# Patient Record
Sex: Male | Born: 1970 | Race: White | Hispanic: No | Smoking: Never smoker
Health system: Southern US, Community
[De-identification: ages and names within clinical notes are randomized; demographics above are authoritative.]

## PROBLEM LIST (undated history)

## (undated) DIAGNOSIS — D72828 Other elevated white blood cell count: Secondary | ICD-10-CM

## (undated) DIAGNOSIS — R569 Unspecified convulsions: Secondary | ICD-10-CM

## (undated) DIAGNOSIS — E7800 Pure hypercholesterolemia, unspecified: Secondary | ICD-10-CM

## (undated) DIAGNOSIS — G0481 Other encephalitis and encephalomyelitis: Secondary | ICD-10-CM

## (undated) DIAGNOSIS — E291 Testicular hypofunction: Secondary | ICD-10-CM

## (undated) DIAGNOSIS — I6783 Posterior reversible encephalopathy syndrome: Secondary | ICD-10-CM

## (undated) DIAGNOSIS — E079 Disorder of thyroid, unspecified: Secondary | ICD-10-CM

## (undated) DIAGNOSIS — I639 Cerebral infarction, unspecified: Secondary | ICD-10-CM

## (undated) DIAGNOSIS — R066 Hiccough: Secondary | ICD-10-CM

## (undated) DIAGNOSIS — C719 Malignant neoplasm of brain, unspecified: Secondary | ICD-10-CM

## (undated) HISTORY — DX: Other elevated white blood cell count: D72.828

## (undated) HISTORY — DX: Unspecified convulsions: R56.9

## (undated) HISTORY — DX: Hiccough: R06.6

## (undated) HISTORY — PX: BRAIN SURGERY: SHX531

## (undated) HISTORY — DX: Pure hypercholesterolemia, unspecified: E78.00

## (undated) HISTORY — DX: Cerebral infarction, unspecified: I63.9

## (undated) HISTORY — DX: Other encephalitis and encephalomyelitis: G04.81

## (undated) HISTORY — DX: Disorder of thyroid, unspecified: E07.9

## (undated) HISTORY — DX: Posterior reversible encephalopathy syndrome: I67.83

## (undated) HISTORY — PX: EYE SURGERY: SHX253

## (undated) HISTORY — PX: APPENDECTOMY: SHX54

## (undated) HISTORY — DX: Testicular hypofunction: E29.1

## (undated) HISTORY — DX: Malignant neoplasm of brain, unspecified: C71.9

---

## 1991-03-18 DIAGNOSIS — G40909 Epilepsy, unspecified, not intractable, without status epilepticus: Secondary | ICD-10-CM | POA: Insufficient documentation

## 2002-10-04 ENCOUNTER — Inpatient Hospital Stay (HOSPITAL_COMMUNITY)
Admission: RE | Admit: 2002-10-04 | Discharge: 2002-10-25 | Payer: Self-pay | Admitting: Physical Medicine & Rehabilitation

## 2002-10-05 ENCOUNTER — Encounter: Payer: Self-pay | Admitting: Physical Medicine & Rehabilitation

## 2002-10-06 ENCOUNTER — Encounter: Payer: Self-pay | Admitting: Physical Medicine & Rehabilitation

## 2002-11-26 ENCOUNTER — Encounter
Admission: RE | Admit: 2002-11-26 | Discharge: 2003-02-24 | Payer: Self-pay | Admitting: Physical Medicine & Rehabilitation

## 2003-05-07 ENCOUNTER — Encounter
Admission: RE | Admit: 2003-05-07 | Discharge: 2003-08-05 | Payer: Self-pay | Admitting: Physical Medicine & Rehabilitation

## 2003-08-06 ENCOUNTER — Encounter
Admission: RE | Admit: 2003-08-06 | Discharge: 2003-11-04 | Payer: Self-pay | Admitting: Physical Medicine & Rehabilitation

## 2004-02-02 ENCOUNTER — Encounter
Admission: RE | Admit: 2004-02-02 | Discharge: 2004-05-02 | Payer: Self-pay | Admitting: Physical Medicine & Rehabilitation

## 2004-02-04 ENCOUNTER — Ambulatory Visit: Payer: Self-pay | Admitting: Physical Medicine & Rehabilitation

## 2011-01-31 DIAGNOSIS — E039 Hypothyroidism, unspecified: Secondary | ICD-10-CM | POA: Diagnosis not present

## 2011-01-31 DIAGNOSIS — L509 Urticaria, unspecified: Secondary | ICD-10-CM | POA: Diagnosis not present

## 2011-04-25 DIAGNOSIS — J309 Allergic rhinitis, unspecified: Secondary | ICD-10-CM | POA: Diagnosis not present

## 2011-04-25 DIAGNOSIS — L500 Allergic urticaria: Secondary | ICD-10-CM | POA: Diagnosis not present

## 2011-05-10 DIAGNOSIS — D235 Other benign neoplasm of skin of trunk: Secondary | ICD-10-CM | POA: Diagnosis not present

## 2011-05-10 DIAGNOSIS — L570 Actinic keratosis: Secondary | ICD-10-CM | POA: Diagnosis not present

## 2011-05-23 DIAGNOSIS — J309 Allergic rhinitis, unspecified: Secondary | ICD-10-CM | POA: Diagnosis not present

## 2011-06-09 DIAGNOSIS — T887XXA Unspecified adverse effect of drug or medicament, initial encounter: Secondary | ICD-10-CM | POA: Diagnosis not present

## 2011-06-09 DIAGNOSIS — T7840XA Allergy, unspecified, initial encounter: Secondary | ICD-10-CM | POA: Diagnosis not present

## 2011-07-07 DIAGNOSIS — IMO0002 Reserved for concepts with insufficient information to code with codable children: Secondary | ICD-10-CM | POA: Diagnosis not present

## 2011-10-18 DIAGNOSIS — C719 Malignant neoplasm of brain, unspecified: Secondary | ICD-10-CM | POA: Diagnosis not present

## 2011-10-18 DIAGNOSIS — C716 Malignant neoplasm of cerebellum: Secondary | ICD-10-CM | POA: Diagnosis not present

## 2011-10-20 DIAGNOSIS — Z23 Encounter for immunization: Secondary | ICD-10-CM | POA: Diagnosis not present

## 2012-02-01 DIAGNOSIS — J209 Acute bronchitis, unspecified: Secondary | ICD-10-CM | POA: Diagnosis not present

## 2012-07-10 DIAGNOSIS — E291 Testicular hypofunction: Secondary | ICD-10-CM | POA: Diagnosis not present

## 2012-09-07 DIAGNOSIS — R21 Rash and other nonspecific skin eruption: Secondary | ICD-10-CM | POA: Diagnosis not present

## 2012-10-17 DIAGNOSIS — Z23 Encounter for immunization: Secondary | ICD-10-CM | POA: Diagnosis not present

## 2012-10-23 DIAGNOSIS — C716 Malignant neoplasm of cerebellum: Secondary | ICD-10-CM | POA: Diagnosis not present

## 2012-10-23 DIAGNOSIS — D320 Benign neoplasm of cerebral meninges: Secondary | ICD-10-CM | POA: Diagnosis not present

## 2012-11-19 DIAGNOSIS — C716 Malignant neoplasm of cerebellum: Secondary | ICD-10-CM | POA: Diagnosis not present

## 2012-11-19 DIAGNOSIS — IMO0001 Reserved for inherently not codable concepts without codable children: Secondary | ICD-10-CM | POA: Diagnosis not present

## 2012-11-19 DIAGNOSIS — R269 Unspecified abnormalities of gait and mobility: Secondary | ICD-10-CM | POA: Diagnosis not present

## 2012-11-23 DIAGNOSIS — R269 Unspecified abnormalities of gait and mobility: Secondary | ICD-10-CM | POA: Diagnosis not present

## 2012-11-23 DIAGNOSIS — C716 Malignant neoplasm of cerebellum: Secondary | ICD-10-CM | POA: Diagnosis not present

## 2012-11-23 DIAGNOSIS — IMO0001 Reserved for inherently not codable concepts without codable children: Secondary | ICD-10-CM | POA: Diagnosis not present

## 2012-11-28 DIAGNOSIS — R269 Unspecified abnormalities of gait and mobility: Secondary | ICD-10-CM | POA: Diagnosis not present

## 2012-11-28 DIAGNOSIS — IMO0001 Reserved for inherently not codable concepts without codable children: Secondary | ICD-10-CM | POA: Diagnosis not present

## 2012-11-28 DIAGNOSIS — C716 Malignant neoplasm of cerebellum: Secondary | ICD-10-CM | POA: Diagnosis not present

## 2012-11-30 DIAGNOSIS — IMO0001 Reserved for inherently not codable concepts without codable children: Secondary | ICD-10-CM | POA: Diagnosis not present

## 2012-11-30 DIAGNOSIS — R269 Unspecified abnormalities of gait and mobility: Secondary | ICD-10-CM | POA: Diagnosis not present

## 2012-11-30 DIAGNOSIS — C716 Malignant neoplasm of cerebellum: Secondary | ICD-10-CM | POA: Diagnosis not present

## 2012-12-03 DIAGNOSIS — C716 Malignant neoplasm of cerebellum: Secondary | ICD-10-CM | POA: Diagnosis not present

## 2012-12-03 DIAGNOSIS — R269 Unspecified abnormalities of gait and mobility: Secondary | ICD-10-CM | POA: Diagnosis not present

## 2012-12-03 DIAGNOSIS — IMO0001 Reserved for inherently not codable concepts without codable children: Secondary | ICD-10-CM | POA: Diagnosis not present

## 2012-12-07 DIAGNOSIS — IMO0001 Reserved for inherently not codable concepts without codable children: Secondary | ICD-10-CM | POA: Diagnosis not present

## 2012-12-07 DIAGNOSIS — C716 Malignant neoplasm of cerebellum: Secondary | ICD-10-CM | POA: Diagnosis not present

## 2012-12-07 DIAGNOSIS — R269 Unspecified abnormalities of gait and mobility: Secondary | ICD-10-CM | POA: Diagnosis not present

## 2012-12-11 DIAGNOSIS — R269 Unspecified abnormalities of gait and mobility: Secondary | ICD-10-CM | POA: Diagnosis not present

## 2012-12-11 DIAGNOSIS — C716 Malignant neoplasm of cerebellum: Secondary | ICD-10-CM | POA: Diagnosis not present

## 2012-12-11 DIAGNOSIS — IMO0001 Reserved for inherently not codable concepts without codable children: Secondary | ICD-10-CM | POA: Diagnosis not present

## 2012-12-18 DIAGNOSIS — R29898 Other symptoms and signs involving the musculoskeletal system: Secondary | ICD-10-CM | POA: Diagnosis not present

## 2012-12-18 DIAGNOSIS — IMO0001 Reserved for inherently not codable concepts without codable children: Secondary | ICD-10-CM | POA: Diagnosis not present

## 2012-12-18 DIAGNOSIS — C716 Malignant neoplasm of cerebellum: Secondary | ICD-10-CM | POA: Diagnosis not present

## 2012-12-18 DIAGNOSIS — R269 Unspecified abnormalities of gait and mobility: Secondary | ICD-10-CM | POA: Diagnosis not present

## 2012-12-20 DIAGNOSIS — C716 Malignant neoplasm of cerebellum: Secondary | ICD-10-CM | POA: Diagnosis not present

## 2012-12-20 DIAGNOSIS — IMO0001 Reserved for inherently not codable concepts without codable children: Secondary | ICD-10-CM | POA: Diagnosis not present

## 2012-12-20 DIAGNOSIS — R29898 Other symptoms and signs involving the musculoskeletal system: Secondary | ICD-10-CM | POA: Diagnosis not present

## 2012-12-20 DIAGNOSIS — R269 Unspecified abnormalities of gait and mobility: Secondary | ICD-10-CM | POA: Diagnosis not present

## 2012-12-26 DIAGNOSIS — IMO0001 Reserved for inherently not codable concepts without codable children: Secondary | ICD-10-CM | POA: Diagnosis not present

## 2012-12-26 DIAGNOSIS — R269 Unspecified abnormalities of gait and mobility: Secondary | ICD-10-CM | POA: Diagnosis not present

## 2012-12-26 DIAGNOSIS — R29898 Other symptoms and signs involving the musculoskeletal system: Secondary | ICD-10-CM | POA: Diagnosis not present

## 2012-12-26 DIAGNOSIS — C716 Malignant neoplasm of cerebellum: Secondary | ICD-10-CM | POA: Diagnosis not present

## 2012-12-28 DIAGNOSIS — R269 Unspecified abnormalities of gait and mobility: Secondary | ICD-10-CM | POA: Diagnosis not present

## 2012-12-28 DIAGNOSIS — C716 Malignant neoplasm of cerebellum: Secondary | ICD-10-CM | POA: Diagnosis not present

## 2012-12-28 DIAGNOSIS — IMO0001 Reserved for inherently not codable concepts without codable children: Secondary | ICD-10-CM | POA: Diagnosis not present

## 2012-12-28 DIAGNOSIS — R29898 Other symptoms and signs involving the musculoskeletal system: Secondary | ICD-10-CM | POA: Diagnosis not present

## 2012-12-31 DIAGNOSIS — C716 Malignant neoplasm of cerebellum: Secondary | ICD-10-CM | POA: Diagnosis not present

## 2012-12-31 DIAGNOSIS — IMO0001 Reserved for inherently not codable concepts without codable children: Secondary | ICD-10-CM | POA: Diagnosis not present

## 2012-12-31 DIAGNOSIS — R29898 Other symptoms and signs involving the musculoskeletal system: Secondary | ICD-10-CM | POA: Diagnosis not present

## 2012-12-31 DIAGNOSIS — R269 Unspecified abnormalities of gait and mobility: Secondary | ICD-10-CM | POA: Diagnosis not present

## 2013-01-02 DIAGNOSIS — R269 Unspecified abnormalities of gait and mobility: Secondary | ICD-10-CM | POA: Diagnosis not present

## 2013-01-02 DIAGNOSIS — IMO0001 Reserved for inherently not codable concepts without codable children: Secondary | ICD-10-CM | POA: Diagnosis not present

## 2013-01-02 DIAGNOSIS — R29898 Other symptoms and signs involving the musculoskeletal system: Secondary | ICD-10-CM | POA: Diagnosis not present

## 2013-01-02 DIAGNOSIS — C716 Malignant neoplasm of cerebellum: Secondary | ICD-10-CM | POA: Diagnosis not present

## 2013-01-08 DIAGNOSIS — R29898 Other symptoms and signs involving the musculoskeletal system: Secondary | ICD-10-CM | POA: Diagnosis not present

## 2013-01-08 DIAGNOSIS — C716 Malignant neoplasm of cerebellum: Secondary | ICD-10-CM | POA: Diagnosis not present

## 2013-01-08 DIAGNOSIS — R269 Unspecified abnormalities of gait and mobility: Secondary | ICD-10-CM | POA: Diagnosis not present

## 2013-01-08 DIAGNOSIS — IMO0001 Reserved for inherently not codable concepts without codable children: Secondary | ICD-10-CM | POA: Diagnosis not present

## 2013-01-15 DIAGNOSIS — IMO0001 Reserved for inherently not codable concepts without codable children: Secondary | ICD-10-CM | POA: Diagnosis not present

## 2013-01-15 DIAGNOSIS — R269 Unspecified abnormalities of gait and mobility: Secondary | ICD-10-CM | POA: Diagnosis not present

## 2013-01-15 DIAGNOSIS — R29898 Other symptoms and signs involving the musculoskeletal system: Secondary | ICD-10-CM | POA: Diagnosis not present

## 2013-01-15 DIAGNOSIS — C716 Malignant neoplasm of cerebellum: Secondary | ICD-10-CM | POA: Diagnosis not present

## 2013-02-04 DIAGNOSIS — E291 Testicular hypofunction: Secondary | ICD-10-CM | POA: Diagnosis not present

## 2013-02-04 DIAGNOSIS — Z79899 Other long term (current) drug therapy: Secondary | ICD-10-CM | POA: Diagnosis not present

## 2013-02-04 DIAGNOSIS — E038 Other specified hypothyroidism: Secondary | ICD-10-CM | POA: Diagnosis not present

## 2013-02-04 DIAGNOSIS — G40909 Epilepsy, unspecified, not intractable, without status epilepticus: Secondary | ICD-10-CM | POA: Diagnosis not present

## 2013-03-06 DIAGNOSIS — IMO0001 Reserved for inherently not codable concepts without codable children: Secondary | ICD-10-CM | POA: Diagnosis not present

## 2013-03-06 DIAGNOSIS — R471 Dysarthria and anarthria: Secondary | ICD-10-CM | POA: Diagnosis not present

## 2013-03-06 DIAGNOSIS — R498 Other voice and resonance disorders: Secondary | ICD-10-CM | POA: Diagnosis not present

## 2013-03-06 DIAGNOSIS — Z9889 Other specified postprocedural states: Secondary | ICD-10-CM | POA: Diagnosis not present

## 2013-03-06 DIAGNOSIS — R4789 Other speech disturbances: Secondary | ICD-10-CM | POA: Diagnosis not present

## 2013-03-11 DIAGNOSIS — R4789 Other speech disturbances: Secondary | ICD-10-CM | POA: Diagnosis not present

## 2013-03-11 DIAGNOSIS — Z9889 Other specified postprocedural states: Secondary | ICD-10-CM | POA: Diagnosis not present

## 2013-03-11 DIAGNOSIS — R498 Other voice and resonance disorders: Secondary | ICD-10-CM | POA: Diagnosis not present

## 2013-03-11 DIAGNOSIS — R471 Dysarthria and anarthria: Secondary | ICD-10-CM | POA: Diagnosis not present

## 2013-03-11 DIAGNOSIS — IMO0001 Reserved for inherently not codable concepts without codable children: Secondary | ICD-10-CM | POA: Diagnosis not present

## 2013-03-13 DIAGNOSIS — R4789 Other speech disturbances: Secondary | ICD-10-CM | POA: Diagnosis not present

## 2013-03-13 DIAGNOSIS — R471 Dysarthria and anarthria: Secondary | ICD-10-CM | POA: Diagnosis not present

## 2013-03-13 DIAGNOSIS — Z9889 Other specified postprocedural states: Secondary | ICD-10-CM | POA: Diagnosis not present

## 2013-03-13 DIAGNOSIS — IMO0001 Reserved for inherently not codable concepts without codable children: Secondary | ICD-10-CM | POA: Diagnosis not present

## 2013-03-13 DIAGNOSIS — R498 Other voice and resonance disorders: Secondary | ICD-10-CM | POA: Diagnosis not present

## 2013-03-18 DIAGNOSIS — R4789 Other speech disturbances: Secondary | ICD-10-CM | POA: Diagnosis not present

## 2013-03-18 DIAGNOSIS — IMO0001 Reserved for inherently not codable concepts without codable children: Secondary | ICD-10-CM | POA: Diagnosis not present

## 2013-03-18 DIAGNOSIS — Z9889 Other specified postprocedural states: Secondary | ICD-10-CM | POA: Diagnosis not present

## 2013-03-18 DIAGNOSIS — R498 Other voice and resonance disorders: Secondary | ICD-10-CM | POA: Diagnosis not present

## 2013-03-18 DIAGNOSIS — R471 Dysarthria and anarthria: Secondary | ICD-10-CM | POA: Diagnosis not present

## 2013-04-03 DIAGNOSIS — Z86010 Personal history of colonic polyps: Secondary | ICD-10-CM | POA: Diagnosis not present

## 2013-04-03 DIAGNOSIS — K648 Other hemorrhoids: Secondary | ICD-10-CM | POA: Diagnosis not present

## 2013-04-03 DIAGNOSIS — D126 Benign neoplasm of colon, unspecified: Secondary | ICD-10-CM | POA: Diagnosis not present

## 2013-04-03 DIAGNOSIS — K921 Melena: Secondary | ICD-10-CM | POA: Diagnosis not present

## 2013-04-17 DIAGNOSIS — R4789 Other speech disturbances: Secondary | ICD-10-CM | POA: Diagnosis not present

## 2013-04-17 DIAGNOSIS — Z9889 Other specified postprocedural states: Secondary | ICD-10-CM | POA: Diagnosis not present

## 2013-04-17 DIAGNOSIS — IMO0001 Reserved for inherently not codable concepts without codable children: Secondary | ICD-10-CM | POA: Diagnosis not present

## 2013-04-17 DIAGNOSIS — R471 Dysarthria and anarthria: Secondary | ICD-10-CM | POA: Diagnosis not present

## 2013-04-17 DIAGNOSIS — R498 Other voice and resonance disorders: Secondary | ICD-10-CM | POA: Diagnosis not present

## 2013-04-18 DIAGNOSIS — D126 Benign neoplasm of colon, unspecified: Secondary | ICD-10-CM | POA: Diagnosis not present

## 2013-04-18 DIAGNOSIS — Z86010 Personal history of colonic polyps: Secondary | ICD-10-CM | POA: Diagnosis not present

## 2013-04-18 DIAGNOSIS — K6289 Other specified diseases of anus and rectum: Secondary | ICD-10-CM | POA: Diagnosis not present

## 2013-04-18 DIAGNOSIS — Z800 Family history of malignant neoplasm of digestive organs: Secondary | ICD-10-CM | POA: Diagnosis not present

## 2013-04-18 DIAGNOSIS — Z85841 Personal history of malignant neoplasm of brain: Secondary | ICD-10-CM | POA: Diagnosis not present

## 2013-04-18 DIAGNOSIS — K648 Other hemorrhoids: Secondary | ICD-10-CM | POA: Diagnosis not present

## 2013-04-18 DIAGNOSIS — K219 Gastro-esophageal reflux disease without esophagitis: Secondary | ICD-10-CM | POA: Diagnosis not present

## 2013-04-18 DIAGNOSIS — K5900 Constipation, unspecified: Secondary | ICD-10-CM | POA: Diagnosis not present

## 2013-04-18 DIAGNOSIS — Z79899 Other long term (current) drug therapy: Secondary | ICD-10-CM | POA: Diagnosis not present

## 2013-04-18 DIAGNOSIS — G40909 Epilepsy, unspecified, not intractable, without status epilepticus: Secondary | ICD-10-CM | POA: Diagnosis not present

## 2013-04-18 DIAGNOSIS — K921 Melena: Secondary | ICD-10-CM | POA: Diagnosis not present

## 2013-04-22 DIAGNOSIS — R471 Dysarthria and anarthria: Secondary | ICD-10-CM | POA: Diagnosis not present

## 2013-04-22 DIAGNOSIS — R4789 Other speech disturbances: Secondary | ICD-10-CM | POA: Diagnosis not present

## 2013-04-22 DIAGNOSIS — R498 Other voice and resonance disorders: Secondary | ICD-10-CM | POA: Diagnosis not present

## 2013-04-22 DIAGNOSIS — IMO0001 Reserved for inherently not codable concepts without codable children: Secondary | ICD-10-CM | POA: Diagnosis not present

## 2013-04-22 DIAGNOSIS — Z9889 Other specified postprocedural states: Secondary | ICD-10-CM | POA: Diagnosis not present

## 2013-04-24 DIAGNOSIS — R498 Other voice and resonance disorders: Secondary | ICD-10-CM | POA: Diagnosis not present

## 2013-04-24 DIAGNOSIS — IMO0001 Reserved for inherently not codable concepts without codable children: Secondary | ICD-10-CM | POA: Diagnosis not present

## 2013-04-24 DIAGNOSIS — Z9889 Other specified postprocedural states: Secondary | ICD-10-CM | POA: Diagnosis not present

## 2013-04-24 DIAGNOSIS — R4789 Other speech disturbances: Secondary | ICD-10-CM | POA: Diagnosis not present

## 2013-04-24 DIAGNOSIS — R471 Dysarthria and anarthria: Secondary | ICD-10-CM | POA: Diagnosis not present

## 2013-05-09 DIAGNOSIS — Z9889 Other specified postprocedural states: Secondary | ICD-10-CM | POA: Diagnosis not present

## 2013-05-09 DIAGNOSIS — R471 Dysarthria and anarthria: Secondary | ICD-10-CM | POA: Diagnosis not present

## 2013-05-09 DIAGNOSIS — IMO0001 Reserved for inherently not codable concepts without codable children: Secondary | ICD-10-CM | POA: Diagnosis not present

## 2013-05-09 DIAGNOSIS — R498 Other voice and resonance disorders: Secondary | ICD-10-CM | POA: Diagnosis not present

## 2013-05-09 DIAGNOSIS — R4789 Other speech disturbances: Secondary | ICD-10-CM | POA: Diagnosis not present

## 2013-05-13 DIAGNOSIS — Z9889 Other specified postprocedural states: Secondary | ICD-10-CM | POA: Diagnosis not present

## 2013-05-13 DIAGNOSIS — R471 Dysarthria and anarthria: Secondary | ICD-10-CM | POA: Diagnosis not present

## 2013-05-13 DIAGNOSIS — IMO0001 Reserved for inherently not codable concepts without codable children: Secondary | ICD-10-CM | POA: Diagnosis not present

## 2013-05-13 DIAGNOSIS — R498 Other voice and resonance disorders: Secondary | ICD-10-CM | POA: Diagnosis not present

## 2013-05-13 DIAGNOSIS — R4789 Other speech disturbances: Secondary | ICD-10-CM | POA: Diagnosis not present

## 2013-05-15 DIAGNOSIS — R4789 Other speech disturbances: Secondary | ICD-10-CM | POA: Diagnosis not present

## 2013-05-15 DIAGNOSIS — R471 Dysarthria and anarthria: Secondary | ICD-10-CM | POA: Diagnosis not present

## 2013-05-15 DIAGNOSIS — R498 Other voice and resonance disorders: Secondary | ICD-10-CM | POA: Diagnosis not present

## 2013-05-15 DIAGNOSIS — IMO0001 Reserved for inherently not codable concepts without codable children: Secondary | ICD-10-CM | POA: Diagnosis not present

## 2013-05-15 DIAGNOSIS — Z9889 Other specified postprocedural states: Secondary | ICD-10-CM | POA: Diagnosis not present

## 2013-05-20 DIAGNOSIS — E782 Mixed hyperlipidemia: Secondary | ICD-10-CM | POA: Diagnosis not present

## 2013-05-20 DIAGNOSIS — G40909 Epilepsy, unspecified, not intractable, without status epilepticus: Secondary | ICD-10-CM | POA: Diagnosis not present

## 2013-05-20 DIAGNOSIS — R4789 Other speech disturbances: Secondary | ICD-10-CM | POA: Diagnosis not present

## 2013-05-20 DIAGNOSIS — E291 Testicular hypofunction: Secondary | ICD-10-CM | POA: Diagnosis not present

## 2013-05-20 DIAGNOSIS — R471 Dysarthria and anarthria: Secondary | ICD-10-CM | POA: Diagnosis not present

## 2013-05-20 DIAGNOSIS — E038 Other specified hypothyroidism: Secondary | ICD-10-CM | POA: Diagnosis not present

## 2013-05-20 DIAGNOSIS — IMO0001 Reserved for inherently not codable concepts without codable children: Secondary | ICD-10-CM | POA: Diagnosis not present

## 2013-05-20 DIAGNOSIS — Z9889 Other specified postprocedural states: Secondary | ICD-10-CM | POA: Diagnosis not present

## 2013-05-20 DIAGNOSIS — Z79899 Other long term (current) drug therapy: Secondary | ICD-10-CM | POA: Diagnosis not present

## 2013-05-20 DIAGNOSIS — R498 Other voice and resonance disorders: Secondary | ICD-10-CM | POA: Diagnosis not present

## 2013-05-22 DIAGNOSIS — Z9889 Other specified postprocedural states: Secondary | ICD-10-CM | POA: Diagnosis not present

## 2013-05-22 DIAGNOSIS — R4789 Other speech disturbances: Secondary | ICD-10-CM | POA: Diagnosis not present

## 2013-05-22 DIAGNOSIS — IMO0001 Reserved for inherently not codable concepts without codable children: Secondary | ICD-10-CM | POA: Diagnosis not present

## 2013-05-22 DIAGNOSIS — R498 Other voice and resonance disorders: Secondary | ICD-10-CM | POA: Diagnosis not present

## 2013-05-22 DIAGNOSIS — R471 Dysarthria and anarthria: Secondary | ICD-10-CM | POA: Diagnosis not present

## 2013-05-27 DIAGNOSIS — R471 Dysarthria and anarthria: Secondary | ICD-10-CM | POA: Diagnosis not present

## 2013-05-27 DIAGNOSIS — R4789 Other speech disturbances: Secondary | ICD-10-CM | POA: Diagnosis not present

## 2013-05-27 DIAGNOSIS — R498 Other voice and resonance disorders: Secondary | ICD-10-CM | POA: Diagnosis not present

## 2013-05-27 DIAGNOSIS — IMO0001 Reserved for inherently not codable concepts without codable children: Secondary | ICD-10-CM | POA: Diagnosis not present

## 2013-05-27 DIAGNOSIS — Z9889 Other specified postprocedural states: Secondary | ICD-10-CM | POA: Diagnosis not present

## 2013-06-03 DIAGNOSIS — Z86010 Personal history of colonic polyps: Secondary | ICD-10-CM | POA: Diagnosis not present

## 2013-06-03 DIAGNOSIS — K648 Other hemorrhoids: Secondary | ICD-10-CM | POA: Diagnosis not present

## 2013-06-03 DIAGNOSIS — K219 Gastro-esophageal reflux disease without esophagitis: Secondary | ICD-10-CM | POA: Diagnosis not present

## 2013-08-17 DIAGNOSIS — R509 Fever, unspecified: Secondary | ICD-10-CM | POA: Diagnosis not present

## 2013-08-17 DIAGNOSIS — R0602 Shortness of breath: Secondary | ICD-10-CM | POA: Diagnosis not present

## 2013-08-17 DIAGNOSIS — R5381 Other malaise: Secondary | ICD-10-CM | POA: Diagnosis not present

## 2013-08-17 DIAGNOSIS — R296 Repeated falls: Secondary | ICD-10-CM | POA: Diagnosis not present

## 2013-08-19 DIAGNOSIS — D7582 Heparin induced thrombocytopenia (HIT): Secondary | ICD-10-CM | POA: Diagnosis present

## 2013-08-19 DIAGNOSIS — K828 Other specified diseases of gallbladder: Secondary | ICD-10-CM | POA: Diagnosis not present

## 2013-08-19 DIAGNOSIS — K219 Gastro-esophageal reflux disease without esophagitis: Secondary | ICD-10-CM | POA: Diagnosis not present

## 2013-08-19 DIAGNOSIS — I517 Cardiomegaly: Secondary | ICD-10-CM | POA: Diagnosis not present

## 2013-08-19 DIAGNOSIS — F05 Delirium due to known physiological condition: Secondary | ICD-10-CM | POA: Diagnosis not present

## 2013-08-19 DIAGNOSIS — G0490 Encephalitis and encephalomyelitis, unspecified: Secondary | ICD-10-CM | POA: Diagnosis not present

## 2013-08-19 DIAGNOSIS — E038 Other specified hypothyroidism: Secondary | ICD-10-CM | POA: Diagnosis present

## 2013-08-19 DIAGNOSIS — R4182 Altered mental status, unspecified: Secondary | ICD-10-CM | POA: Diagnosis not present

## 2013-08-19 DIAGNOSIS — Z9889 Other specified postprocedural states: Secondary | ICD-10-CM | POA: Diagnosis not present

## 2013-08-19 DIAGNOSIS — R05 Cough: Secondary | ICD-10-CM | POA: Diagnosis not present

## 2013-08-19 DIAGNOSIS — J9819 Other pulmonary collapse: Secondary | ICD-10-CM | POA: Diagnosis not present

## 2013-08-19 DIAGNOSIS — G9340 Encephalopathy, unspecified: Secondary | ICD-10-CM | POA: Diagnosis not present

## 2013-08-19 DIAGNOSIS — I9589 Other hypotension: Secondary | ICD-10-CM | POA: Diagnosis not present

## 2013-08-19 DIAGNOSIS — Z452 Encounter for adjustment and management of vascular access device: Secondary | ICD-10-CM | POA: Diagnosis not present

## 2013-08-19 DIAGNOSIS — Z4682 Encounter for fitting and adjustment of non-vascular catheter: Secondary | ICD-10-CM | POA: Diagnosis not present

## 2013-08-19 DIAGNOSIS — D320 Benign neoplasm of cerebral meninges: Secondary | ICD-10-CM | POA: Diagnosis not present

## 2013-08-19 DIAGNOSIS — J90 Pleural effusion, not elsewhere classified: Secondary | ICD-10-CM | POA: Diagnosis not present

## 2013-08-19 DIAGNOSIS — R491 Aphonia: Secondary | ICD-10-CM | POA: Diagnosis not present

## 2013-08-19 DIAGNOSIS — I959 Hypotension, unspecified: Secondary | ICD-10-CM | POA: Diagnosis not present

## 2013-08-19 DIAGNOSIS — R1312 Dysphagia, oropharyngeal phase: Secondary | ICD-10-CM | POA: Diagnosis not present

## 2013-08-19 DIAGNOSIS — G939 Disorder of brain, unspecified: Secondary | ICD-10-CM | POA: Diagnosis not present

## 2013-08-19 DIAGNOSIS — D696 Thrombocytopenia, unspecified: Secondary | ICD-10-CM | POA: Diagnosis present

## 2013-08-19 DIAGNOSIS — I6340 Cerebral infarction due to embolism of unspecified cerebral artery: Secondary | ICD-10-CM | POA: Diagnosis not present

## 2013-08-19 DIAGNOSIS — G929 Unspecified toxic encephalopathy: Secondary | ICD-10-CM | POA: Diagnosis present

## 2013-08-19 DIAGNOSIS — G40909 Epilepsy, unspecified, not intractable, without status epilepticus: Secondary | ICD-10-CM | POA: Diagnosis not present

## 2013-08-19 DIAGNOSIS — G988 Other disorders of nervous system: Secondary | ICD-10-CM | POA: Diagnosis not present

## 2013-08-19 DIAGNOSIS — E873 Alkalosis: Secondary | ICD-10-CM | POA: Diagnosis not present

## 2013-08-19 DIAGNOSIS — R4701 Aphasia: Secondary | ICD-10-CM | POA: Diagnosis not present

## 2013-08-19 DIAGNOSIS — R918 Other nonspecific abnormal finding of lung field: Secondary | ICD-10-CM | POA: Diagnosis not present

## 2013-08-19 DIAGNOSIS — M5090 Cervical disc disorder, unspecified, unspecified cervical region: Secondary | ICD-10-CM | POA: Diagnosis not present

## 2013-08-19 DIAGNOSIS — R930 Abnormal findings on diagnostic imaging of skull and head, not elsewhere classified: Secondary | ICD-10-CM | POA: Diagnosis not present

## 2013-08-19 DIAGNOSIS — D332 Benign neoplasm of brain, unspecified: Secondary | ICD-10-CM | POA: Diagnosis not present

## 2013-08-19 DIAGNOSIS — R404 Transient alteration of awareness: Secondary | ICD-10-CM | POA: Diagnosis not present

## 2013-08-19 DIAGNOSIS — R059 Cough, unspecified: Secondary | ICD-10-CM | POA: Diagnosis not present

## 2013-08-19 DIAGNOSIS — E871 Hypo-osmolality and hyponatremia: Secondary | ICD-10-CM

## 2013-08-19 DIAGNOSIS — J9600 Acute respiratory failure, unspecified whether with hypoxia or hypercapnia: Secondary | ICD-10-CM | POA: Diagnosis not present

## 2013-08-19 DIAGNOSIS — G40919 Epilepsy, unspecified, intractable, without status epilepticus: Secondary | ICD-10-CM | POA: Diagnosis not present

## 2013-08-19 DIAGNOSIS — R5600 Simple febrile convulsions: Secondary | ICD-10-CM | POA: Diagnosis not present

## 2013-08-19 DIAGNOSIS — Z9221 Personal history of antineoplastic chemotherapy: Secondary | ICD-10-CM | POA: Diagnosis not present

## 2013-08-19 DIAGNOSIS — R509 Fever, unspecified: Secondary | ICD-10-CM | POA: Diagnosis not present

## 2013-08-19 DIAGNOSIS — Z85841 Personal history of malignant neoplasm of brain: Secondary | ICD-10-CM | POA: Diagnosis not present

## 2013-08-19 DIAGNOSIS — G0481 Other encephalitis and encephalomyelitis: Secondary | ICD-10-CM | POA: Diagnosis not present

## 2013-08-19 DIAGNOSIS — R0609 Other forms of dyspnea: Secondary | ICD-10-CM | POA: Diagnosis not present

## 2013-08-19 DIAGNOSIS — R29898 Other symptoms and signs involving the musculoskeletal system: Secondary | ICD-10-CM | POA: Diagnosis present

## 2013-08-19 DIAGNOSIS — R5381 Other malaise: Secondary | ICD-10-CM | POA: Diagnosis not present

## 2013-08-19 DIAGNOSIS — G92 Toxic encephalopathy: Secondary | ICD-10-CM | POA: Diagnosis present

## 2013-08-19 DIAGNOSIS — D75829 Heparin-induced thrombocytopenia, unspecified: Secondary | ICD-10-CM | POA: Diagnosis not present

## 2013-08-19 DIAGNOSIS — G0491 Myelitis, unspecified: Secondary | ICD-10-CM | POA: Diagnosis not present

## 2013-08-19 DIAGNOSIS — Z66 Do not resuscitate: Secondary | ICD-10-CM | POA: Diagnosis not present

## 2013-08-19 DIAGNOSIS — Z923 Personal history of irradiation: Secondary | ICD-10-CM | POA: Diagnosis not present

## 2013-08-19 DIAGNOSIS — R569 Unspecified convulsions: Secondary | ICD-10-CM | POA: Diagnosis not present

## 2013-08-19 DIAGNOSIS — E291 Testicular hypofunction: Secondary | ICD-10-CM | POA: Diagnosis present

## 2013-08-19 DIAGNOSIS — C716 Malignant neoplasm of cerebellum: Secondary | ICD-10-CM | POA: Diagnosis not present

## 2013-08-19 DIAGNOSIS — G40319 Generalized idiopathic epilepsy and epileptic syndromes, intractable, without status epilepticus: Secondary | ICD-10-CM | POA: Diagnosis not present

## 2013-09-20 DIAGNOSIS — R569 Unspecified convulsions: Secondary | ICD-10-CM | POA: Diagnosis not present

## 2013-09-20 DIAGNOSIS — D320 Benign neoplasm of cerebral meninges: Secondary | ICD-10-CM | POA: Diagnosis not present

## 2013-09-20 DIAGNOSIS — C716 Malignant neoplasm of cerebellum: Secondary | ICD-10-CM | POA: Diagnosis not present

## 2013-09-22 DIAGNOSIS — R569 Unspecified convulsions: Secondary | ICD-10-CM | POA: Diagnosis not present

## 2013-09-22 DIAGNOSIS — R4701 Aphasia: Secondary | ICD-10-CM | POA: Diagnosis not present

## 2013-09-22 DIAGNOSIS — R279 Unspecified lack of coordination: Secondary | ICD-10-CM | POA: Diagnosis not present

## 2013-09-22 DIAGNOSIS — F3289 Other specified depressive episodes: Secondary | ICD-10-CM | POA: Diagnosis not present

## 2013-09-22 DIAGNOSIS — E785 Hyperlipidemia, unspecified: Secondary | ICD-10-CM | POA: Diagnosis not present

## 2013-09-22 DIAGNOSIS — E870 Hyperosmolality and hypernatremia: Secondary | ICD-10-CM | POA: Diagnosis not present

## 2013-09-22 DIAGNOSIS — R404 Transient alteration of awareness: Secondary | ICD-10-CM | POA: Diagnosis not present

## 2013-09-22 DIAGNOSIS — R1312 Dysphagia, oropharyngeal phase: Secondary | ICD-10-CM | POA: Diagnosis not present

## 2013-09-22 DIAGNOSIS — G40909 Epilepsy, unspecified, not intractable, without status epilepticus: Secondary | ICD-10-CM | POA: Diagnosis not present

## 2013-09-22 DIAGNOSIS — R1310 Dysphagia, unspecified: Secondary | ICD-10-CM | POA: Diagnosis not present

## 2013-09-22 DIAGNOSIS — C716 Malignant neoplasm of cerebellum: Secondary | ICD-10-CM | POA: Diagnosis not present

## 2013-09-22 DIAGNOSIS — K219 Gastro-esophageal reflux disease without esophagitis: Secondary | ICD-10-CM | POA: Diagnosis not present

## 2013-09-22 DIAGNOSIS — G0491 Myelitis, unspecified: Secondary | ICD-10-CM | POA: Diagnosis not present

## 2013-09-22 DIAGNOSIS — E871 Hypo-osmolality and hyponatremia: Secondary | ICD-10-CM | POA: Diagnosis not present

## 2013-09-22 DIAGNOSIS — R491 Aphonia: Secondary | ICD-10-CM | POA: Diagnosis not present

## 2013-09-22 DIAGNOSIS — E039 Hypothyroidism, unspecified: Secondary | ICD-10-CM | POA: Diagnosis not present

## 2013-09-22 DIAGNOSIS — G9349 Other encephalopathy: Secondary | ICD-10-CM | POA: Diagnosis not present

## 2013-09-22 DIAGNOSIS — G9340 Encephalopathy, unspecified: Secondary | ICD-10-CM | POA: Diagnosis not present

## 2013-09-22 DIAGNOSIS — G40919 Epilepsy, unspecified, intractable, without status epilepticus: Secondary | ICD-10-CM | POA: Diagnosis not present

## 2013-09-22 DIAGNOSIS — Z85841 Personal history of malignant neoplasm of brain: Secondary | ICD-10-CM | POA: Diagnosis not present

## 2013-09-22 DIAGNOSIS — I69959 Hemiplegia and hemiparesis following unspecified cerebrovascular disease affecting unspecified side: Secondary | ICD-10-CM | POA: Diagnosis not present

## 2013-09-22 DIAGNOSIS — M6281 Muscle weakness (generalized): Secondary | ICD-10-CM | POA: Diagnosis not present

## 2013-09-22 DIAGNOSIS — G0481 Other encephalitis and encephalomyelitis: Secondary | ICD-10-CM | POA: Diagnosis not present

## 2013-09-22 DIAGNOSIS — D75829 Heparin-induced thrombocytopenia, unspecified: Secondary | ICD-10-CM | POA: Diagnosis not present

## 2013-09-22 DIAGNOSIS — Z5189 Encounter for other specified aftercare: Secondary | ICD-10-CM | POA: Diagnosis not present

## 2013-09-22 DIAGNOSIS — F411 Generalized anxiety disorder: Secondary | ICD-10-CM | POA: Diagnosis not present

## 2013-09-22 DIAGNOSIS — E291 Testicular hypofunction: Secondary | ICD-10-CM | POA: Diagnosis not present

## 2013-09-22 DIAGNOSIS — D7582 Heparin induced thrombocytopenia (HIT): Secondary | ICD-10-CM | POA: Diagnosis not present

## 2013-09-22 DIAGNOSIS — J9600 Acute respiratory failure, unspecified whether with hypoxia or hypercapnia: Secondary | ICD-10-CM | POA: Diagnosis not present

## 2013-09-22 DIAGNOSIS — G0490 Encephalitis and encephalomyelitis, unspecified: Secondary | ICD-10-CM | POA: Diagnosis not present

## 2013-09-22 DIAGNOSIS — R262 Difficulty in walking, not elsewhere classified: Secondary | ICD-10-CM | POA: Diagnosis not present

## 2013-09-22 DIAGNOSIS — F329 Major depressive disorder, single episode, unspecified: Secondary | ICD-10-CM | POA: Diagnosis not present

## 2013-09-22 DIAGNOSIS — G9341 Metabolic encephalopathy: Secondary | ICD-10-CM | POA: Diagnosis not present

## 2013-10-03 DIAGNOSIS — Z931 Gastrostomy status: Secondary | ICD-10-CM | POA: Diagnosis not present

## 2013-10-03 DIAGNOSIS — R0902 Hypoxemia: Secondary | ICD-10-CM | POA: Diagnosis not present

## 2013-10-03 DIAGNOSIS — I69391 Dysphagia following cerebral infarction: Secondary | ICD-10-CM | POA: Diagnosis not present

## 2013-10-03 DIAGNOSIS — R05 Cough: Secondary | ICD-10-CM | POA: Diagnosis not present

## 2013-10-03 DIAGNOSIS — G8190 Hemiplegia, unspecified affecting unspecified side: Secondary | ICD-10-CM | POA: Diagnosis not present

## 2013-10-03 DIAGNOSIS — M6281 Muscle weakness (generalized): Secondary | ICD-10-CM | POA: Diagnosis not present

## 2013-10-03 DIAGNOSIS — Z23 Encounter for immunization: Secondary | ICD-10-CM | POA: Diagnosis not present

## 2013-10-03 DIAGNOSIS — R1310 Dysphagia, unspecified: Secondary | ICD-10-CM | POA: Diagnosis not present

## 2013-10-03 DIAGNOSIS — E785 Hyperlipidemia, unspecified: Secondary | ICD-10-CM | POA: Diagnosis not present

## 2013-10-03 DIAGNOSIS — J129 Viral pneumonia, unspecified: Secondary | ICD-10-CM | POA: Diagnosis not present

## 2013-10-03 DIAGNOSIS — Z982 Presence of cerebrospinal fluid drainage device: Secondary | ICD-10-CM | POA: Diagnosis not present

## 2013-10-03 DIAGNOSIS — J690 Pneumonitis due to inhalation of food and vomit: Secondary | ICD-10-CM | POA: Diagnosis present

## 2013-10-03 DIAGNOSIS — R279 Unspecified lack of coordination: Secondary | ICD-10-CM | POA: Diagnosis not present

## 2013-10-03 DIAGNOSIS — R748 Abnormal levels of other serum enzymes: Secondary | ICD-10-CM | POA: Diagnosis not present

## 2013-10-03 DIAGNOSIS — J9601 Acute respiratory failure with hypoxia: Secondary | ICD-10-CM | POA: Diagnosis not present

## 2013-10-03 DIAGNOSIS — G0490 Encephalitis and encephalomyelitis, unspecified: Secondary | ICD-10-CM | POA: Diagnosis not present

## 2013-10-03 DIAGNOSIS — G9389 Other specified disorders of brain: Secondary | ICD-10-CM | POA: Diagnosis present

## 2013-10-03 DIAGNOSIS — G40919 Epilepsy, unspecified, intractable, without status epilepticus: Secondary | ICD-10-CM | POA: Diagnosis not present

## 2013-10-03 DIAGNOSIS — Z7901 Long term (current) use of anticoagulants: Secondary | ICD-10-CM | POA: Diagnosis not present

## 2013-10-03 DIAGNOSIS — Z7401 Bed confinement status: Secondary | ICD-10-CM | POA: Diagnosis not present

## 2013-10-03 DIAGNOSIS — I6350 Cerebral infarction due to unspecified occlusion or stenosis of unspecified cerebral artery: Secondary | ICD-10-CM | POA: Diagnosis not present

## 2013-10-03 DIAGNOSIS — R4182 Altered mental status, unspecified: Secondary | ICD-10-CM | POA: Diagnosis not present

## 2013-10-03 DIAGNOSIS — E039 Hypothyroidism, unspecified: Secondary | ICD-10-CM | POA: Diagnosis not present

## 2013-10-03 DIAGNOSIS — D75829 Heparin-induced thrombocytopenia, unspecified: Secondary | ICD-10-CM | POA: Diagnosis not present

## 2013-10-03 DIAGNOSIS — Z79899 Other long term (current) drug therapy: Secondary | ICD-10-CM | POA: Diagnosis not present

## 2013-10-03 DIAGNOSIS — R5081 Fever presenting with conditions classified elsewhere: Secondary | ICD-10-CM | POA: Diagnosis present

## 2013-10-03 DIAGNOSIS — R112 Nausea with vomiting, unspecified: Secondary | ICD-10-CM | POA: Diagnosis not present

## 2013-10-03 DIAGNOSIS — G0491 Myelitis, unspecified: Secondary | ICD-10-CM | POA: Diagnosis not present

## 2013-10-03 DIAGNOSIS — R509 Fever, unspecified: Secondary | ICD-10-CM | POA: Diagnosis not present

## 2013-10-03 DIAGNOSIS — J189 Pneumonia, unspecified organism: Secondary | ICD-10-CM | POA: Diagnosis not present

## 2013-10-03 DIAGNOSIS — R262 Difficulty in walking, not elsewhere classified: Secondary | ICD-10-CM | POA: Diagnosis not present

## 2013-10-03 DIAGNOSIS — G40909 Epilepsy, unspecified, not intractable, without status epilepticus: Secondary | ICD-10-CM | POA: Diagnosis not present

## 2013-10-03 DIAGNOSIS — Z85841 Personal history of malignant neoplasm of brain: Secondary | ICD-10-CM | POA: Diagnosis not present

## 2013-10-03 DIAGNOSIS — A419 Sepsis, unspecified organism: Secondary | ICD-10-CM | POA: Diagnosis not present

## 2013-10-03 DIAGNOSIS — Z66 Do not resuscitate: Secondary | ICD-10-CM | POA: Diagnosis not present

## 2013-10-03 DIAGNOSIS — E291 Testicular hypofunction: Secondary | ICD-10-CM | POA: Diagnosis present

## 2013-10-03 DIAGNOSIS — I6930 Unspecified sequelae of cerebral infarction: Secondary | ICD-10-CM | POA: Diagnosis not present

## 2013-10-03 DIAGNOSIS — R569 Unspecified convulsions: Secondary | ICD-10-CM | POA: Diagnosis not present

## 2013-10-03 DIAGNOSIS — I69322 Dysarthria following cerebral infarction: Secondary | ICD-10-CM | POA: Diagnosis not present

## 2013-10-03 DIAGNOSIS — G9349 Other encephalopathy: Secondary | ICD-10-CM | POA: Diagnosis not present

## 2013-10-03 DIAGNOSIS — D7582 Heparin induced thrombocytopenia (HIT): Secondary | ICD-10-CM | POA: Diagnosis not present

## 2013-10-03 DIAGNOSIS — R5381 Other malaise: Secondary | ICD-10-CM | POA: Diagnosis not present

## 2013-10-03 DIAGNOSIS — G9341 Metabolic encephalopathy: Secondary | ICD-10-CM | POA: Diagnosis not present

## 2013-10-03 DIAGNOSIS — K219 Gastro-esophageal reflux disease without esophagitis: Secondary | ICD-10-CM | POA: Diagnosis not present

## 2013-10-03 DIAGNOSIS — J159 Unspecified bacterial pneumonia: Secondary | ICD-10-CM | POA: Diagnosis not present

## 2013-10-10 DIAGNOSIS — I6350 Cerebral infarction due to unspecified occlusion or stenosis of unspecified cerebral artery: Secondary | ICD-10-CM | POA: Diagnosis not present

## 2013-10-10 DIAGNOSIS — R112 Nausea with vomiting, unspecified: Secondary | ICD-10-CM | POA: Diagnosis not present

## 2013-10-10 DIAGNOSIS — G40919 Epilepsy, unspecified, intractable, without status epilepticus: Secondary | ICD-10-CM | POA: Diagnosis not present

## 2013-10-10 DIAGNOSIS — G8190 Hemiplegia, unspecified affecting unspecified side: Secondary | ICD-10-CM | POA: Diagnosis not present

## 2013-10-28 DIAGNOSIS — J129 Viral pneumonia, unspecified: Secondary | ICD-10-CM | POA: Diagnosis not present

## 2013-11-07 DIAGNOSIS — I6930 Unspecified sequelae of cerebral infarction: Secondary | ICD-10-CM | POA: Diagnosis not present

## 2013-11-07 DIAGNOSIS — R5381 Other malaise: Secondary | ICD-10-CM | POA: Diagnosis not present

## 2013-11-07 DIAGNOSIS — M6281 Muscle weakness (generalized): Secondary | ICD-10-CM | POA: Diagnosis not present

## 2013-11-07 DIAGNOSIS — G40909 Epilepsy, unspecified, not intractable, without status epilepticus: Secondary | ICD-10-CM | POA: Diagnosis not present

## 2013-11-27 DIAGNOSIS — J690 Pneumonitis due to inhalation of food and vomit: Secondary | ICD-10-CM | POA: Diagnosis not present

## 2013-11-27 DIAGNOSIS — G40909 Epilepsy, unspecified, not intractable, without status epilepticus: Secondary | ICD-10-CM | POA: Diagnosis not present

## 2013-11-27 DIAGNOSIS — J9811 Atelectasis: Secondary | ICD-10-CM | POA: Diagnosis not present

## 2013-11-27 DIAGNOSIS — J9690 Respiratory failure, unspecified, unspecified whether with hypoxia or hypercapnia: Secondary | ICD-10-CM | POA: Diagnosis not present

## 2013-11-27 DIAGNOSIS — Z9989 Dependence on other enabling machines and devices: Secondary | ICD-10-CM | POA: Diagnosis not present

## 2013-11-27 DIAGNOSIS — Z7401 Bed confinement status: Secondary | ICD-10-CM | POA: Diagnosis not present

## 2013-11-27 DIAGNOSIS — E780 Pure hypercholesterolemia: Secondary | ICD-10-CM | POA: Diagnosis not present

## 2013-11-27 DIAGNOSIS — Z85841 Personal history of malignant neoplasm of brain: Secondary | ICD-10-CM | POA: Diagnosis not present

## 2013-11-27 DIAGNOSIS — Z7901 Long term (current) use of anticoagulants: Secondary | ICD-10-CM | POA: Diagnosis not present

## 2013-11-27 DIAGNOSIS — R4182 Altered mental status, unspecified: Secondary | ICD-10-CM | POA: Diagnosis not present

## 2013-11-27 DIAGNOSIS — I69391 Dysphagia following cerebral infarction: Secondary | ICD-10-CM | POA: Diagnosis not present

## 2013-11-27 DIAGNOSIS — Z931 Gastrostomy status: Secondary | ICD-10-CM | POA: Diagnosis not present

## 2013-11-27 DIAGNOSIS — E291 Testicular hypofunction: Secondary | ICD-10-CM | POA: Diagnosis not present

## 2013-11-27 DIAGNOSIS — R1310 Dysphagia, unspecified: Secondary | ICD-10-CM | POA: Diagnosis present

## 2013-11-27 DIAGNOSIS — Z79899 Other long term (current) drug therapy: Secondary | ICD-10-CM | POA: Diagnosis not present

## 2013-11-27 DIAGNOSIS — G9389 Other specified disorders of brain: Secondary | ICD-10-CM | POA: Diagnosis not present

## 2013-11-27 DIAGNOSIS — A419 Sepsis, unspecified organism: Secondary | ICD-10-CM | POA: Diagnosis not present

## 2013-11-27 DIAGNOSIS — R5081 Fever presenting with conditions classified elsewhere: Secondary | ICD-10-CM | POA: Diagnosis present

## 2013-11-27 DIAGNOSIS — I69954 Hemiplegia and hemiparesis following unspecified cerebrovascular disease affecting left non-dominant side: Secondary | ICD-10-CM | POA: Diagnosis not present

## 2013-11-27 DIAGNOSIS — J159 Unspecified bacterial pneumonia: Secondary | ICD-10-CM | POA: Diagnosis not present

## 2013-11-27 DIAGNOSIS — R7881 Bacteremia: Secondary | ICD-10-CM | POA: Diagnosis not present

## 2013-11-27 DIAGNOSIS — J189 Pneumonia, unspecified organism: Secondary | ICD-10-CM | POA: Diagnosis not present

## 2013-11-27 DIAGNOSIS — R0902 Hypoxemia: Secondary | ICD-10-CM | POA: Diagnosis not present

## 2013-11-27 DIAGNOSIS — R509 Fever, unspecified: Secondary | ICD-10-CM | POA: Diagnosis not present

## 2013-11-27 DIAGNOSIS — R069 Unspecified abnormalities of breathing: Secondary | ICD-10-CM | POA: Diagnosis not present

## 2013-11-27 DIAGNOSIS — R0600 Dyspnea, unspecified: Secondary | ICD-10-CM | POA: Diagnosis not present

## 2013-11-27 DIAGNOSIS — J9600 Acute respiratory failure, unspecified whether with hypoxia or hypercapnia: Secondary | ICD-10-CM | POA: Diagnosis not present

## 2013-11-27 DIAGNOSIS — Z23 Encounter for immunization: Secondary | ICD-10-CM | POA: Diagnosis not present

## 2013-11-27 DIAGNOSIS — Z982 Presence of cerebrospinal fluid drainage device: Secondary | ICD-10-CM | POA: Diagnosis not present

## 2013-11-27 DIAGNOSIS — R748 Abnormal levels of other serum enzymes: Secondary | ICD-10-CM | POA: Diagnosis not present

## 2013-11-27 DIAGNOSIS — I69322 Dysarthria following cerebral infarction: Secondary | ICD-10-CM | POA: Diagnosis not present

## 2013-11-27 DIAGNOSIS — E039 Hypothyroidism, unspecified: Secondary | ICD-10-CM | POA: Diagnosis not present

## 2013-11-27 DIAGNOSIS — Z66 Do not resuscitate: Secondary | ICD-10-CM | POA: Diagnosis not present

## 2013-11-27 DIAGNOSIS — J9601 Acute respiratory failure with hypoxia: Secondary | ICD-10-CM | POA: Diagnosis not present

## 2013-11-27 DIAGNOSIS — G0490 Encephalitis and encephalomyelitis, unspecified: Secondary | ICD-10-CM | POA: Diagnosis not present

## 2013-11-27 DIAGNOSIS — R5381 Other malaise: Secondary | ICD-10-CM | POA: Diagnosis not present

## 2013-12-07 DIAGNOSIS — I69954 Hemiplegia and hemiparesis following unspecified cerebrovascular disease affecting left non-dominant side: Secondary | ICD-10-CM | POA: Diagnosis not present

## 2013-12-07 DIAGNOSIS — Z7901 Long term (current) use of anticoagulants: Secondary | ICD-10-CM | POA: Diagnosis not present

## 2013-12-07 DIAGNOSIS — R4182 Altered mental status, unspecified: Secondary | ICD-10-CM | POA: Diagnosis not present

## 2013-12-07 DIAGNOSIS — Z9989 Dependence on other enabling machines and devices: Secondary | ICD-10-CM | POA: Diagnosis not present

## 2013-12-07 DIAGNOSIS — E039 Hypothyroidism, unspecified: Secondary | ICD-10-CM | POA: Diagnosis not present

## 2013-12-07 DIAGNOSIS — E780 Pure hypercholesterolemia: Secondary | ICD-10-CM | POA: Diagnosis not present

## 2013-12-07 DIAGNOSIS — J189 Pneumonia, unspecified organism: Secondary | ICD-10-CM | POA: Diagnosis not present

## 2013-12-07 DIAGNOSIS — R0902 Hypoxemia: Secondary | ICD-10-CM | POA: Diagnosis not present

## 2013-12-07 DIAGNOSIS — R0600 Dyspnea, unspecified: Secondary | ICD-10-CM | POA: Diagnosis not present

## 2013-12-08 DIAGNOSIS — J9622 Acute and chronic respiratory failure with hypercapnia: Secondary | ICD-10-CM | POA: Insufficient documentation

## 2013-12-08 DIAGNOSIS — Z452 Encounter for adjustment and management of vascular access device: Secondary | ICD-10-CM | POA: Diagnosis not present

## 2013-12-08 DIAGNOSIS — R392 Extrarenal uremia: Secondary | ICD-10-CM | POA: Diagnosis not present

## 2013-12-08 DIAGNOSIS — R918 Other nonspecific abnormal finding of lung field: Secondary | ICD-10-CM | POA: Diagnosis not present

## 2013-12-08 DIAGNOSIS — G9389 Other specified disorders of brain: Secondary | ICD-10-CM | POA: Diagnosis not present

## 2013-12-08 DIAGNOSIS — A047 Enterocolitis due to Clostridium difficile: Secondary | ICD-10-CM | POA: Diagnosis not present

## 2013-12-08 DIAGNOSIS — J90 Pleural effusion, not elsewhere classified: Secondary | ICD-10-CM | POA: Diagnosis not present

## 2013-12-08 DIAGNOSIS — E873 Alkalosis: Secondary | ICD-10-CM | POA: Diagnosis not present

## 2013-12-08 DIAGNOSIS — E038 Other specified hypothyroidism: Secondary | ICD-10-CM | POA: Diagnosis present

## 2013-12-08 DIAGNOSIS — R001 Bradycardia, unspecified: Secondary | ICD-10-CM | POA: Diagnosis not present

## 2013-12-08 DIAGNOSIS — G40909 Epilepsy, unspecified, not intractable, without status epilepticus: Secondary | ICD-10-CM | POA: Diagnosis not present

## 2013-12-08 DIAGNOSIS — Z8673 Personal history of transient ischemic attack (TIA), and cerebral infarction without residual deficits: Secondary | ICD-10-CM | POA: Diagnosis not present

## 2013-12-08 DIAGNOSIS — Z79899 Other long term (current) drug therapy: Secondary | ICD-10-CM | POA: Diagnosis not present

## 2013-12-08 DIAGNOSIS — R4182 Altered mental status, unspecified: Secondary | ICD-10-CM | POA: Diagnosis not present

## 2013-12-08 DIAGNOSIS — R569 Unspecified convulsions: Secondary | ICD-10-CM | POA: Diagnosis not present

## 2013-12-08 DIAGNOSIS — Z7901 Long term (current) use of anticoagulants: Secondary | ICD-10-CM | POA: Diagnosis not present

## 2013-12-08 DIAGNOSIS — Z4682 Encounter for fitting and adjustment of non-vascular catheter: Secondary | ICD-10-CM | POA: Diagnosis not present

## 2013-12-08 DIAGNOSIS — J189 Pneumonia, unspecified organism: Secondary | ICD-10-CM | POA: Insufficient documentation

## 2013-12-08 DIAGNOSIS — R0689 Other abnormalities of breathing: Secondary | ICD-10-CM | POA: Diagnosis not present

## 2013-12-08 DIAGNOSIS — F329 Major depressive disorder, single episode, unspecified: Secondary | ICD-10-CM | POA: Diagnosis present

## 2013-12-08 DIAGNOSIS — Z923 Personal history of irradiation: Secondary | ICD-10-CM | POA: Diagnosis not present

## 2013-12-08 DIAGNOSIS — D7582 Heparin induced thrombocytopenia (HIT): Secondary | ICD-10-CM | POA: Diagnosis not present

## 2013-12-08 DIAGNOSIS — G9349 Other encephalopathy: Secondary | ICD-10-CM | POA: Diagnosis not present

## 2013-12-08 DIAGNOSIS — E291 Testicular hypofunction: Secondary | ICD-10-CM | POA: Diagnosis present

## 2013-12-08 DIAGNOSIS — G0481 Other encephalitis and encephalomyelitis: Secondary | ICD-10-CM | POA: Diagnosis not present

## 2013-12-08 DIAGNOSIS — J9690 Respiratory failure, unspecified, unspecified whether with hypoxia or hypercapnia: Secondary | ICD-10-CM | POA: Diagnosis not present

## 2013-12-08 DIAGNOSIS — J690 Pneumonitis due to inhalation of food and vomit: Secondary | ICD-10-CM | POA: Diagnosis not present

## 2013-12-08 DIAGNOSIS — G0490 Encephalitis and encephalomyelitis, unspecified: Secondary | ICD-10-CM | POA: Diagnosis not present

## 2013-12-08 DIAGNOSIS — I959 Hypotension, unspecified: Secondary | ICD-10-CM | POA: Diagnosis not present

## 2013-12-08 DIAGNOSIS — R1310 Dysphagia, unspecified: Secondary | ICD-10-CM | POA: Diagnosis present

## 2013-12-08 DIAGNOSIS — E870 Hyperosmolality and hypernatremia: Secondary | ICD-10-CM | POA: Diagnosis present

## 2013-12-08 DIAGNOSIS — J9811 Atelectasis: Secondary | ICD-10-CM | POA: Diagnosis not present

## 2013-12-08 DIAGNOSIS — R471 Dysarthria and anarthria: Secondary | ICD-10-CM | POA: Diagnosis present

## 2013-12-09 DIAGNOSIS — R6889 Other general symptoms and signs: Secondary | ICD-10-CM | POA: Insufficient documentation

## 2013-12-21 DIAGNOSIS — I959 Hypotension, unspecified: Secondary | ICD-10-CM | POA: Insufficient documentation

## 2013-12-21 DIAGNOSIS — R7989 Other specified abnormal findings of blood chemistry: Secondary | ICD-10-CM | POA: Insufficient documentation

## 2013-12-21 DIAGNOSIS — R001 Bradycardia, unspecified: Secondary | ICD-10-CM | POA: Insufficient documentation

## 2013-12-21 DIAGNOSIS — A0472 Enterocolitis due to Clostridium difficile, not specified as recurrent: Secondary | ICD-10-CM | POA: Insufficient documentation

## 2013-12-23 ENCOUNTER — Inpatient Hospital Stay
Admission: AD | Admit: 2013-12-23 | Discharge: 2014-01-16 | Disposition: A | Discharge status: Skilled Nursing Facility | Payer: Self-pay | Source: Ambulatory Visit | Attending: Internal Medicine | Admitting: Internal Medicine

## 2013-12-23 ENCOUNTER — Other Ambulatory Visit (HOSPITAL_COMMUNITY): Payer: Self-pay

## 2013-12-23 DIAGNOSIS — E785 Hyperlipidemia, unspecified: Secondary | ICD-10-CM | POA: Diagnosis present

## 2013-12-23 DIAGNOSIS — Z430 Encounter for attention to tracheostomy: Secondary | ICD-10-CM | POA: Diagnosis not present

## 2013-12-23 DIAGNOSIS — G0490 Encephalitis and encephalomyelitis, unspecified: Secondary | ICD-10-CM | POA: Diagnosis not present

## 2013-12-23 DIAGNOSIS — R569 Unspecified convulsions: Secondary | ICD-10-CM | POA: Diagnosis present

## 2013-12-23 DIAGNOSIS — R262 Difficulty in walking, not elsewhere classified: Secondary | ICD-10-CM | POA: Diagnosis not present

## 2013-12-23 DIAGNOSIS — R0689 Other abnormalities of breathing: Secondary | ICD-10-CM | POA: Diagnosis not present

## 2013-12-23 DIAGNOSIS — Z4682 Encounter for fitting and adjustment of non-vascular catheter: Secondary | ICD-10-CM | POA: Diagnosis not present

## 2013-12-23 DIAGNOSIS — R001 Bradycardia, unspecified: Secondary | ICD-10-CM | POA: Diagnosis not present

## 2013-12-23 DIAGNOSIS — E440 Moderate protein-calorie malnutrition: Secondary | ICD-10-CM | POA: Diagnosis not present

## 2013-12-23 DIAGNOSIS — R0602 Shortness of breath: Secondary | ICD-10-CM | POA: Diagnosis not present

## 2013-12-23 DIAGNOSIS — N390 Urinary tract infection, site not specified: Secondary | ICD-10-CM | POA: Diagnosis not present

## 2013-12-23 DIAGNOSIS — Z66 Do not resuscitate: Secondary | ICD-10-CM | POA: Diagnosis present

## 2013-12-23 DIAGNOSIS — I10 Essential (primary) hypertension: Secondary | ICD-10-CM | POA: Diagnosis not present

## 2013-12-23 DIAGNOSIS — M6281 Muscle weakness (generalized): Secondary | ICD-10-CM | POA: Diagnosis not present

## 2013-12-23 DIAGNOSIS — T8351XA Infection and inflammatory reaction due to indwelling urinary catheter, initial encounter: Secondary | ICD-10-CM | POA: Diagnosis not present

## 2013-12-23 DIAGNOSIS — Z7401 Bed confinement status: Secondary | ICD-10-CM | POA: Diagnosis not present

## 2013-12-23 DIAGNOSIS — C342 Malignant neoplasm of middle lobe, bronchus or lung: Secondary | ICD-10-CM | POA: Diagnosis not present

## 2013-12-23 DIAGNOSIS — E039 Hypothyroidism, unspecified: Secondary | ICD-10-CM | POA: Diagnosis not present

## 2013-12-23 DIAGNOSIS — R1310 Dysphagia, unspecified: Secondary | ICD-10-CM | POA: Diagnosis not present

## 2013-12-23 DIAGNOSIS — Z431 Encounter for attention to gastrostomy: Secondary | ICD-10-CM | POA: Diagnosis not present

## 2013-12-23 DIAGNOSIS — J80 Acute respiratory distress syndrome: Secondary | ICD-10-CM | POA: Diagnosis not present

## 2013-12-23 DIAGNOSIS — J9600 Acute respiratory failure, unspecified whether with hypoxia or hypercapnia: Secondary | ICD-10-CM | POA: Diagnosis present

## 2013-12-23 DIAGNOSIS — R279 Unspecified lack of coordination: Secondary | ICD-10-CM | POA: Diagnosis not present

## 2013-12-23 DIAGNOSIS — Z982 Presence of cerebrospinal fluid drainage device: Secondary | ICD-10-CM | POA: Diagnosis not present

## 2013-12-23 DIAGNOSIS — R489 Unspecified symbolic dysfunctions: Secondary | ICD-10-CM | POA: Diagnosis not present

## 2013-12-23 DIAGNOSIS — Z86011 Personal history of benign neoplasm of the brain: Secondary | ICD-10-CM | POA: Diagnosis not present

## 2013-12-23 DIAGNOSIS — A047 Enterocolitis due to Clostridium difficile: Secondary | ICD-10-CM | POA: Diagnosis not present

## 2013-12-23 DIAGNOSIS — E876 Hypokalemia: Secondary | ICD-10-CM | POA: Diagnosis not present

## 2013-12-23 DIAGNOSIS — J9811 Atelectasis: Secondary | ICD-10-CM | POA: Diagnosis not present

## 2013-12-23 DIAGNOSIS — Z789 Other specified health status: Secondary | ICD-10-CM

## 2013-12-23 DIAGNOSIS — G9349 Other encephalopathy: Secondary | ICD-10-CM | POA: Diagnosis not present

## 2013-12-23 DIAGNOSIS — A419 Sepsis, unspecified organism: Secondary | ICD-10-CM | POA: Diagnosis not present

## 2013-12-23 DIAGNOSIS — Z736 Limitation of activities due to disability: Secondary | ICD-10-CM | POA: Diagnosis not present

## 2013-12-23 DIAGNOSIS — J9690 Respiratory failure, unspecified, unspecified whether with hypoxia or hypercapnia: Secondary | ICD-10-CM | POA: Diagnosis not present

## 2013-12-23 DIAGNOSIS — D7582 Heparin induced thrombocytopenia (HIT): Secondary | ICD-10-CM | POA: Diagnosis not present

## 2013-12-23 DIAGNOSIS — Z930 Tracheostomy status: Secondary | ICD-10-CM | POA: Diagnosis not present

## 2013-12-23 DIAGNOSIS — Z8619 Personal history of other infectious and parasitic diseases: Secondary | ICD-10-CM | POA: Diagnosis not present

## 2013-12-23 DIAGNOSIS — B965 Pseudomonas (aeruginosa) (mallei) (pseudomallei) as the cause of diseases classified elsewhere: Secondary | ICD-10-CM | POA: Diagnosis not present

## 2013-12-23 DIAGNOSIS — E46 Unspecified protein-calorie malnutrition: Secondary | ICD-10-CM | POA: Diagnosis present

## 2013-12-23 DIAGNOSIS — K219 Gastro-esophageal reflux disease without esophagitis: Secondary | ICD-10-CM | POA: Diagnosis not present

## 2013-12-23 DIAGNOSIS — R339 Retention of urine, unspecified: Secondary | ICD-10-CM | POA: Diagnosis present

## 2013-12-23 DIAGNOSIS — J9610 Chronic respiratory failure, unspecified whether with hypoxia or hypercapnia: Secondary | ICD-10-CM | POA: Diagnosis not present

## 2013-12-23 DIAGNOSIS — Z9911 Dependence on respirator [ventilator] status: Secondary | ICD-10-CM | POA: Diagnosis not present

## 2013-12-23 DIAGNOSIS — J9622 Acute and chronic respiratory failure with hypercapnia: Secondary | ICD-10-CM | POA: Diagnosis not present

## 2013-12-23 MED ORDER — IOHEXOL 300 MG/ML  SOLN
40.0000 mL | Freq: Once | INTRAMUSCULAR | Status: AC | PRN
Start: 2013-12-23 — End: 2013-12-23
  Administered 2013-12-23: 40 mL via ORAL

## 2013-12-24 LAB — CBC WITH DIFFERENTIAL/PLATELET
Basophils Absolute: 0 10*3/uL (ref 0.0–0.1)
Basophils Relative: 0 % (ref 0–1)
EOS ABS: 0.4 10*3/uL (ref 0.0–0.7)
EOS PCT: 8 % — AB (ref 0–5)
HEMATOCRIT: 27.2 % — AB (ref 39.0–52.0)
Hemoglobin: 8.9 g/dL — ABNORMAL LOW (ref 13.0–17.0)
LYMPHS ABS: 1.4 10*3/uL (ref 0.7–4.0)
Lymphocytes Relative: 27 % (ref 12–46)
MCH: 28.9 pg (ref 26.0–34.0)
MCHC: 32.7 g/dL (ref 30.0–36.0)
MCV: 88.3 fL (ref 78.0–100.0)
MONOS PCT: 10 % (ref 3–12)
Monocytes Absolute: 0.5 10*3/uL (ref 0.1–1.0)
Neutro Abs: 3 10*3/uL (ref 1.7–7.7)
Neutrophils Relative %: 55 % (ref 43–77)
Platelets: 336 10*3/uL (ref 150–400)
RBC: 3.08 MIL/uL — AB (ref 4.22–5.81)
RDW: 14.7 % (ref 11.5–15.5)
WBC: 5.4 10*3/uL (ref 4.0–10.5)

## 2013-12-24 LAB — COMPREHENSIVE METABOLIC PANEL
ALK PHOS: 43 U/L (ref 39–117)
ALT: 9 U/L (ref 0–53)
AST: 18 U/L (ref 0–37)
Albumin: 3.8 g/dL (ref 3.5–5.2)
Anion gap: 12 (ref 5–15)
BUN: 17 mg/dL (ref 6–23)
CO2: 29 meq/L (ref 19–32)
Calcium: 9.4 mg/dL (ref 8.4–10.5)
Chloride: 101 mEq/L (ref 96–112)
Creatinine, Ser: 0.8 mg/dL (ref 0.50–1.35)
GLUCOSE: 124 mg/dL — AB (ref 70–99)
Potassium: 3.9 mEq/L (ref 3.7–5.3)
Sodium: 142 mEq/L (ref 137–147)
Total Protein: 6.4 g/dL (ref 6.0–8.3)

## 2013-12-24 LAB — PROTIME-INR
INR: 1.04 (ref 0.00–1.49)
Prothrombin Time: 13.7 seconds (ref 11.6–15.2)

## 2013-12-24 LAB — IRON: Iron: 32 ug/dL — ABNORMAL LOW (ref 42–135)

## 2013-12-24 LAB — PROCALCITONIN: Procalcitonin: <0.1 ng/mL

## 2013-12-24 LAB — HEMOGLOBIN A1C
HEMOGLOBIN A1C: 5.5 % (ref <5.7)
Mean Plasma Glucose: 111 mg/dL (ref <117)

## 2013-12-24 LAB — MAGNESIUM: Magnesium: 2.1 mg/dL (ref 1.5–2.5)

## 2013-12-24 LAB — PHOSPHORUS: PHOSPHORUS: 4 mg/dL (ref 2.3–4.6)

## 2013-12-24 LAB — VITAMIN B12: VITAMIN B 12: 882 pg/mL (ref 211–911)

## 2013-12-24 LAB — TSH: TSH: 5.4 u[IU]/mL — AB (ref 0.350–4.500)

## 2013-12-24 LAB — T4, FREE: Free T4: 0.64 ng/dL — ABNORMAL LOW (ref 0.80–1.80)

## 2013-12-24 LAB — FERRITIN: Ferritin: 276 ng/mL (ref 22–322)

## 2013-12-25 LAB — FOLATE RBC: RBC Folate: 1210 ng/mL — ABNORMAL HIGH (ref >280)

## 2013-12-28 LAB — BASIC METABOLIC PANEL
ANION GAP: 12 (ref 5–15)
BUN: 13 mg/dL (ref 6–23)
CO2: 31 mEq/L (ref 19–32)
CREATININE: 0.75 mg/dL (ref 0.50–1.35)
Calcium: 10.1 mg/dL (ref 8.4–10.5)
Chloride: 97 mEq/L (ref 96–112)
Glucose, Bld: 110 mg/dL — ABNORMAL HIGH (ref 70–99)
Potassium: 4.6 mEq/L (ref 3.7–5.3)
Sodium: 140 mEq/L (ref 137–147)

## 2013-12-28 LAB — CBC
HCT: 30.5 % — ABNORMAL LOW (ref 39.0–52.0)
Hemoglobin: 9.9 g/dL — ABNORMAL LOW (ref 13.0–17.0)
MCH: 29 pg (ref 26.0–34.0)
MCHC: 32.5 g/dL (ref 30.0–36.0)
MCV: 89.4 fL (ref 78.0–100.0)
PLATELETS: 314 10*3/uL (ref 150–400)
RBC: 3.41 MIL/uL — ABNORMAL LOW (ref 4.22–5.81)
RDW: 14.1 % (ref 11.5–15.5)
WBC: 9.8 10*3/uL (ref 4.0–10.5)

## 2014-01-04 LAB — CBC
HEMATOCRIT: 31.9 % — AB (ref 39.0–52.0)
HEMOGLOBIN: 9.9 g/dL — AB (ref 13.0–17.0)
MCH: 27.7 pg (ref 26.0–34.0)
MCHC: 31 g/dL (ref 30.0–36.0)
MCV: 89.1 fL (ref 78.0–100.0)
Platelets: 309 10*3/uL (ref 150–400)
RBC: 3.58 MIL/uL — AB (ref 4.22–5.81)
RDW: 14.1 % (ref 11.5–15.5)
WBC: 17.5 10*3/uL — ABNORMAL HIGH (ref 4.0–10.5)

## 2014-01-04 LAB — BASIC METABOLIC PANEL
ANION GAP: 8 (ref 5–15)
BUN: 24 mg/dL — ABNORMAL HIGH (ref 6–23)
CO2: 35 mEq/L — ABNORMAL HIGH (ref 19–32)
Calcium: 9.6 mg/dL (ref 8.4–10.5)
Chloride: 98 mEq/L (ref 96–112)
Creatinine, Ser: 0.72 mg/dL (ref 0.50–1.35)
GFR calc non Af Amer: >90 mL/min (ref >90)
Glucose, Bld: 175 mg/dL — ABNORMAL HIGH (ref 70–99)
Potassium: 4.3 mEq/L (ref 3.7–5.3)
Sodium: 141 mEq/L (ref 137–147)

## 2014-01-06 LAB — URINALYSIS, ROUTINE W REFLEX MICROSCOPIC
Bilirubin Urine: NEGATIVE
GLUCOSE, UA: NEGATIVE mg/dL
Ketones, ur: NEGATIVE mg/dL
Nitrite: NEGATIVE
PROTEIN: 30 mg/dL — AB
Specific Gravity, Urine: 1.026 (ref 1.005–1.030)
Urobilinogen, UA: 0.2 mg/dL (ref 0.0–1.0)
pH: 6 (ref 5.0–8.0)

## 2014-01-06 LAB — CBC WITH DIFFERENTIAL/PLATELET
Basophils Absolute: 0 10*3/uL (ref 0.0–0.1)
Basophils Relative: 0 % (ref 0–1)
EOS PCT: 0 % (ref 0–5)
Eosinophils Absolute: 0 10*3/uL (ref 0.0–0.7)
HEMATOCRIT: 33.1 % — AB (ref 39.0–52.0)
Hemoglobin: 10.6 g/dL — ABNORMAL LOW (ref 13.0–17.0)
LYMPHS ABS: 3 10*3/uL (ref 0.7–4.0)
Lymphocytes Relative: 15 % (ref 12–46)
MCH: 29 pg (ref 26.0–34.0)
MCHC: 32 g/dL (ref 30.0–36.0)
MCV: 90.4 fL (ref 78.0–100.0)
MONO ABS: 2.2 10*3/uL — AB (ref 0.1–1.0)
MONOS PCT: 11 % (ref 3–12)
NEUTROS ABS: 15 10*3/uL — AB (ref 1.7–7.7)
Neutrophils Relative %: 74 % (ref 43–77)
PLATELETS: 331 10*3/uL (ref 150–400)
RBC: 3.66 MIL/uL — ABNORMAL LOW (ref 4.22–5.81)
RDW: 14.6 % (ref 11.5–15.5)
WBC: 20.2 10*3/uL — AB (ref 4.0–10.5)

## 2014-01-06 LAB — URINE MICROSCOPIC-ADD ON

## 2014-01-06 LAB — CLOSTRIDIUM DIFFICILE BY PCR: Toxigenic C. Difficile by PCR: NEGATIVE

## 2014-01-07 LAB — CBC
HEMATOCRIT: 34.7 % — AB (ref 39.0–52.0)
HEMOGLOBIN: 11 g/dL — AB (ref 13.0–17.0)
MCH: 28.7 pg (ref 26.0–34.0)
MCHC: 31.7 g/dL (ref 30.0–36.0)
MCV: 90.6 fL (ref 78.0–100.0)
Platelets: 332 10*3/uL (ref 150–400)
RBC: 3.83 MIL/uL — AB (ref 4.22–5.81)
RDW: 15 % (ref 11.5–15.5)
WBC: 18.6 10*3/uL — ABNORMAL HIGH (ref 4.0–10.5)

## 2014-01-07 LAB — PROCALCITONIN: Procalcitonin: <0.1 ng/mL

## 2014-01-08 LAB — URINE CULTURE

## 2014-01-12 LAB — TSH: TSH: 0.202 u[IU]/mL — AB (ref 0.350–4.500)

## 2014-01-13 LAB — CBC
HCT: 34.5 % — ABNORMAL LOW (ref 39.0–52.0)
Hemoglobin: 11 g/dL — ABNORMAL LOW (ref 13.0–17.0)
MCH: 28.6 pg (ref 26.0–34.0)
MCHC: 31.9 g/dL (ref 30.0–36.0)
MCV: 89.6 fL (ref 78.0–100.0)
PLATELETS: 233 10*3/uL (ref 150–400)
RBC: 3.85 MIL/uL — ABNORMAL LOW (ref 4.22–5.81)
RDW: 16.3 % — ABNORMAL HIGH (ref 11.5–15.5)
WBC: 13.3 10*3/uL — ABNORMAL HIGH (ref 4.0–10.5)

## 2014-01-13 LAB — BASIC METABOLIC PANEL
Anion gap: 5 (ref 5–15)
BUN: 22 mg/dL (ref 6–23)
CALCIUM: 8.7 mg/dL (ref 8.4–10.5)
CO2: 33 mmol/L — ABNORMAL HIGH (ref 19–32)
Chloride: 98 mEq/L (ref 96–112)
Creatinine, Ser: 0.62 mg/dL (ref 0.50–1.35)
GFR calc Af Amer: >90 mL/min (ref >90)
Glucose, Bld: 121 mg/dL — ABNORMAL HIGH (ref 70–99)
POTASSIUM: 4 mmol/L (ref 3.5–5.1)
Sodium: 136 mmol/L (ref 135–145)

## 2014-01-13 LAB — MAGNESIUM: MAGNESIUM: 2.1 mg/dL (ref 1.5–2.5)

## 2014-01-13 LAB — T3 UPTAKE: T3 Uptake Ratio: 27 % (ref 22–35)

## 2014-01-13 LAB — T4, FREE: FREE T4: 0.83 ng/dL (ref 0.80–1.80)

## 2014-01-16 DIAGNOSIS — I10 Essential (primary) hypertension: Secondary | ICD-10-CM | POA: Diagnosis not present

## 2014-01-16 DIAGNOSIS — D509 Iron deficiency anemia, unspecified: Secondary | ICD-10-CM | POA: Diagnosis not present

## 2014-01-16 DIAGNOSIS — R0602 Shortness of breath: Secondary | ICD-10-CM | POA: Diagnosis not present

## 2014-01-16 DIAGNOSIS — G0490 Encephalitis and encephalomyelitis, unspecified: Secondary | ICD-10-CM | POA: Diagnosis not present

## 2014-01-16 DIAGNOSIS — R7309 Other abnormal glucose: Secondary | ICD-10-CM | POA: Diagnosis not present

## 2014-01-16 DIAGNOSIS — C342 Malignant neoplasm of middle lobe, bronchus or lung: Secondary | ICD-10-CM | POA: Diagnosis not present

## 2014-01-16 DIAGNOSIS — Z736 Limitation of activities due to disability: Secondary | ICD-10-CM | POA: Diagnosis not present

## 2014-01-16 DIAGNOSIS — E46 Unspecified protein-calorie malnutrition: Secondary | ICD-10-CM | POA: Diagnosis not present

## 2014-01-16 DIAGNOSIS — K219 Gastro-esophageal reflux disease without esophagitis: Secondary | ICD-10-CM | POA: Diagnosis not present

## 2014-01-16 DIAGNOSIS — J80 Acute respiratory distress syndrome: Secondary | ICD-10-CM | POA: Diagnosis not present

## 2014-01-16 DIAGNOSIS — E440 Moderate protein-calorie malnutrition: Secondary | ICD-10-CM | POA: Diagnosis not present

## 2014-01-16 DIAGNOSIS — G9389 Other specified disorders of brain: Secondary | ICD-10-CM | POA: Diagnosis not present

## 2014-01-16 DIAGNOSIS — A047 Enterocolitis due to Clostridium difficile: Secondary | ICD-10-CM | POA: Diagnosis not present

## 2014-01-16 DIAGNOSIS — R1310 Dysphagia, unspecified: Secondary | ICD-10-CM | POA: Diagnosis not present

## 2014-01-16 DIAGNOSIS — R262 Difficulty in walking, not elsewhere classified: Secondary | ICD-10-CM | POA: Diagnosis not present

## 2014-01-16 DIAGNOSIS — R489 Unspecified symbolic dysfunctions: Secondary | ICD-10-CM | POA: Diagnosis not present

## 2014-01-16 DIAGNOSIS — E039 Hypothyroidism, unspecified: Secondary | ICD-10-CM | POA: Diagnosis not present

## 2014-01-16 DIAGNOSIS — G9349 Other encephalopathy: Secondary | ICD-10-CM | POA: Diagnosis not present

## 2014-01-16 DIAGNOSIS — J9690 Respiratory failure, unspecified, unspecified whether with hypoxia or hypercapnia: Secondary | ICD-10-CM | POA: Diagnosis not present

## 2014-01-16 DIAGNOSIS — J310 Chronic rhinitis: Secondary | ICD-10-CM | POA: Diagnosis not present

## 2014-01-16 DIAGNOSIS — R279 Unspecified lack of coordination: Secondary | ICD-10-CM | POA: Diagnosis not present

## 2014-01-16 DIAGNOSIS — E1165 Type 2 diabetes mellitus with hyperglycemia: Secondary | ICD-10-CM | POA: Diagnosis not present

## 2014-01-16 DIAGNOSIS — E785 Hyperlipidemia, unspecified: Secondary | ICD-10-CM | POA: Diagnosis not present

## 2014-01-16 DIAGNOSIS — M6281 Muscle weakness (generalized): Secondary | ICD-10-CM | POA: Diagnosis not present

## 2014-01-16 DIAGNOSIS — R569 Unspecified convulsions: Secondary | ICD-10-CM | POA: Diagnosis not present

## 2014-01-16 DIAGNOSIS — Z79899 Other long term (current) drug therapy: Secondary | ICD-10-CM | POA: Diagnosis not present

## 2014-01-16 DIAGNOSIS — G40009 Localization-related (focal) (partial) idiopathic epilepsy and epileptic syndromes with seizures of localized onset, not intractable, without status epilepticus: Secondary | ICD-10-CM | POA: Diagnosis not present

## 2014-01-16 DIAGNOSIS — D7582 Heparin induced thrombocytopenia (HIT): Secondary | ICD-10-CM | POA: Diagnosis not present

## 2014-01-24 DIAGNOSIS — G40009 Localization-related (focal) (partial) idiopathic epilepsy and epileptic syndromes with seizures of localized onset, not intractable, without status epilepticus: Secondary | ICD-10-CM | POA: Diagnosis not present

## 2014-01-24 DIAGNOSIS — D7582 Heparin induced thrombocytopenia (HIT): Secondary | ICD-10-CM | POA: Diagnosis not present

## 2014-01-24 DIAGNOSIS — R7309 Other abnormal glucose: Secondary | ICD-10-CM | POA: Diagnosis not present

## 2014-01-24 DIAGNOSIS — G9389 Other specified disorders of brain: Secondary | ICD-10-CM | POA: Diagnosis not present

## 2014-01-28 DIAGNOSIS — Z79899 Other long term (current) drug therapy: Secondary | ICD-10-CM | POA: Diagnosis not present

## 2014-01-28 DIAGNOSIS — E1165 Type 2 diabetes mellitus with hyperglycemia: Secondary | ICD-10-CM | POA: Diagnosis not present

## 2014-01-28 DIAGNOSIS — J310 Chronic rhinitis: Secondary | ICD-10-CM | POA: Diagnosis not present

## 2014-02-27 DIAGNOSIS — D7582 Heparin induced thrombocytopenia (HIT): Secondary | ICD-10-CM | POA: Diagnosis not present

## 2014-02-27 DIAGNOSIS — G40009 Localization-related (focal) (partial) idiopathic epilepsy and epileptic syndromes with seizures of localized onset, not intractable, without status epilepticus: Secondary | ICD-10-CM | POA: Diagnosis not present

## 2014-02-27 DIAGNOSIS — G9389 Other specified disorders of brain: Secondary | ICD-10-CM | POA: Diagnosis not present

## 2014-02-27 DIAGNOSIS — E039 Hypothyroidism, unspecified: Secondary | ICD-10-CM | POA: Diagnosis not present

## 2014-03-03 DIAGNOSIS — M6281 Muscle weakness (generalized): Secondary | ICD-10-CM | POA: Diagnosis not present

## 2014-03-03 DIAGNOSIS — D649 Anemia, unspecified: Secondary | ICD-10-CM | POA: Diagnosis not present

## 2014-03-03 DIAGNOSIS — E039 Hypothyroidism, unspecified: Secondary | ICD-10-CM | POA: Diagnosis not present

## 2014-03-03 DIAGNOSIS — E785 Hyperlipidemia, unspecified: Secondary | ICD-10-CM | POA: Diagnosis not present

## 2014-03-03 DIAGNOSIS — G40909 Epilepsy, unspecified, not intractable, without status epilepticus: Secondary | ICD-10-CM | POA: Diagnosis not present

## 2014-03-03 DIAGNOSIS — R1310 Dysphagia, unspecified: Secondary | ICD-10-CM | POA: Diagnosis not present

## 2014-03-04 DIAGNOSIS — R1310 Dysphagia, unspecified: Secondary | ICD-10-CM | POA: Diagnosis not present

## 2014-03-04 DIAGNOSIS — G40909 Epilepsy, unspecified, not intractable, without status epilepticus: Secondary | ICD-10-CM | POA: Diagnosis not present

## 2014-03-04 DIAGNOSIS — M6281 Muscle weakness (generalized): Secondary | ICD-10-CM | POA: Diagnosis not present

## 2014-03-04 DIAGNOSIS — D649 Anemia, unspecified: Secondary | ICD-10-CM | POA: Diagnosis not present

## 2014-03-04 DIAGNOSIS — E785 Hyperlipidemia, unspecified: Secondary | ICD-10-CM | POA: Diagnosis not present

## 2014-03-04 DIAGNOSIS — E039 Hypothyroidism, unspecified: Secondary | ICD-10-CM | POA: Diagnosis not present

## 2014-03-07 DIAGNOSIS — D649 Anemia, unspecified: Secondary | ICD-10-CM | POA: Diagnosis not present

## 2014-03-07 DIAGNOSIS — G40909 Epilepsy, unspecified, not intractable, without status epilepticus: Secondary | ICD-10-CM | POA: Diagnosis not present

## 2014-03-07 DIAGNOSIS — E039 Hypothyroidism, unspecified: Secondary | ICD-10-CM | POA: Diagnosis not present

## 2014-03-07 DIAGNOSIS — E785 Hyperlipidemia, unspecified: Secondary | ICD-10-CM | POA: Diagnosis not present

## 2014-03-07 DIAGNOSIS — R1310 Dysphagia, unspecified: Secondary | ICD-10-CM | POA: Diagnosis not present

## 2014-03-07 DIAGNOSIS — M6281 Muscle weakness (generalized): Secondary | ICD-10-CM | POA: Diagnosis not present

## 2014-03-10 DIAGNOSIS — G40909 Epilepsy, unspecified, not intractable, without status epilepticus: Secondary | ICD-10-CM | POA: Diagnosis not present

## 2014-03-10 DIAGNOSIS — R1310 Dysphagia, unspecified: Secondary | ICD-10-CM | POA: Diagnosis not present

## 2014-03-10 DIAGNOSIS — D649 Anemia, unspecified: Secondary | ICD-10-CM | POA: Diagnosis not present

## 2014-03-10 DIAGNOSIS — E039 Hypothyroidism, unspecified: Secondary | ICD-10-CM | POA: Diagnosis not present

## 2014-03-10 DIAGNOSIS — E785 Hyperlipidemia, unspecified: Secondary | ICD-10-CM | POA: Diagnosis not present

## 2014-03-10 DIAGNOSIS — M6281 Muscle weakness (generalized): Secondary | ICD-10-CM | POA: Diagnosis not present

## 2014-03-11 DIAGNOSIS — C716 Malignant neoplasm of cerebellum: Secondary | ICD-10-CM | POA: Diagnosis not present

## 2014-03-11 DIAGNOSIS — I6783 Posterior reversible encephalopathy syndrome: Secondary | ICD-10-CM | POA: Diagnosis not present

## 2014-03-11 DIAGNOSIS — D320 Benign neoplasm of cerebral meninges: Secondary | ICD-10-CM | POA: Diagnosis not present

## 2014-03-11 DIAGNOSIS — G0481 Other encephalitis and encephalomyelitis: Secondary | ICD-10-CM | POA: Diagnosis not present

## 2014-03-11 DIAGNOSIS — R569 Unspecified convulsions: Secondary | ICD-10-CM | POA: Diagnosis not present

## 2014-03-12 DIAGNOSIS — D649 Anemia, unspecified: Secondary | ICD-10-CM | POA: Diagnosis not present

## 2014-03-12 DIAGNOSIS — G40909 Epilepsy, unspecified, not intractable, without status epilepticus: Secondary | ICD-10-CM | POA: Diagnosis not present

## 2014-03-12 DIAGNOSIS — R1310 Dysphagia, unspecified: Secondary | ICD-10-CM | POA: Diagnosis not present

## 2014-03-12 DIAGNOSIS — E039 Hypothyroidism, unspecified: Secondary | ICD-10-CM | POA: Diagnosis not present

## 2014-03-12 DIAGNOSIS — M6281 Muscle weakness (generalized): Secondary | ICD-10-CM | POA: Diagnosis not present

## 2014-03-12 DIAGNOSIS — E785 Hyperlipidemia, unspecified: Secondary | ICD-10-CM | POA: Diagnosis not present

## 2014-03-14 DIAGNOSIS — G40909 Epilepsy, unspecified, not intractable, without status epilepticus: Secondary | ICD-10-CM | POA: Diagnosis not present

## 2014-03-14 DIAGNOSIS — E785 Hyperlipidemia, unspecified: Secondary | ICD-10-CM | POA: Diagnosis not present

## 2014-03-14 DIAGNOSIS — E039 Hypothyroidism, unspecified: Secondary | ICD-10-CM | POA: Diagnosis not present

## 2014-03-14 DIAGNOSIS — R1310 Dysphagia, unspecified: Secondary | ICD-10-CM | POA: Diagnosis not present

## 2014-03-14 DIAGNOSIS — D649 Anemia, unspecified: Secondary | ICD-10-CM | POA: Diagnosis not present

## 2014-03-14 DIAGNOSIS — M6281 Muscle weakness (generalized): Secondary | ICD-10-CM | POA: Diagnosis not present

## 2014-03-17 DIAGNOSIS — D649 Anemia, unspecified: Secondary | ICD-10-CM | POA: Diagnosis not present

## 2014-03-17 DIAGNOSIS — G40909 Epilepsy, unspecified, not intractable, without status epilepticus: Secondary | ICD-10-CM | POA: Diagnosis not present

## 2014-03-17 DIAGNOSIS — M6281 Muscle weakness (generalized): Secondary | ICD-10-CM | POA: Diagnosis not present

## 2014-03-17 DIAGNOSIS — E039 Hypothyroidism, unspecified: Secondary | ICD-10-CM | POA: Diagnosis not present

## 2014-03-17 DIAGNOSIS — R1310 Dysphagia, unspecified: Secondary | ICD-10-CM | POA: Diagnosis not present

## 2014-03-17 DIAGNOSIS — E785 Hyperlipidemia, unspecified: Secondary | ICD-10-CM | POA: Diagnosis not present

## 2014-03-19 DIAGNOSIS — G40909 Epilepsy, unspecified, not intractable, without status epilepticus: Secondary | ICD-10-CM | POA: Diagnosis not present

## 2014-03-19 DIAGNOSIS — E039 Hypothyroidism, unspecified: Secondary | ICD-10-CM | POA: Diagnosis not present

## 2014-03-19 DIAGNOSIS — D649 Anemia, unspecified: Secondary | ICD-10-CM | POA: Diagnosis not present

## 2014-03-19 DIAGNOSIS — R1310 Dysphagia, unspecified: Secondary | ICD-10-CM | POA: Diagnosis not present

## 2014-03-19 DIAGNOSIS — E785 Hyperlipidemia, unspecified: Secondary | ICD-10-CM | POA: Diagnosis not present

## 2014-03-19 DIAGNOSIS — M6281 Muscle weakness (generalized): Secondary | ICD-10-CM | POA: Diagnosis not present

## 2014-03-21 DIAGNOSIS — R1310 Dysphagia, unspecified: Secondary | ICD-10-CM | POA: Diagnosis not present

## 2014-03-21 DIAGNOSIS — E039 Hypothyroidism, unspecified: Secondary | ICD-10-CM | POA: Diagnosis not present

## 2014-03-21 DIAGNOSIS — D649 Anemia, unspecified: Secondary | ICD-10-CM | POA: Diagnosis not present

## 2014-03-21 DIAGNOSIS — M6281 Muscle weakness (generalized): Secondary | ICD-10-CM | POA: Diagnosis not present

## 2014-03-21 DIAGNOSIS — E785 Hyperlipidemia, unspecified: Secondary | ICD-10-CM | POA: Diagnosis not present

## 2014-03-21 DIAGNOSIS — G40909 Epilepsy, unspecified, not intractable, without status epilepticus: Secondary | ICD-10-CM | POA: Diagnosis not present

## 2014-03-22 DIAGNOSIS — D649 Anemia, unspecified: Secondary | ICD-10-CM | POA: Diagnosis not present

## 2014-03-22 DIAGNOSIS — E039 Hypothyroidism, unspecified: Secondary | ICD-10-CM | POA: Diagnosis not present

## 2014-03-22 DIAGNOSIS — G40909 Epilepsy, unspecified, not intractable, without status epilepticus: Secondary | ICD-10-CM | POA: Diagnosis not present

## 2014-03-22 DIAGNOSIS — M6281 Muscle weakness (generalized): Secondary | ICD-10-CM | POA: Diagnosis not present

## 2014-03-22 DIAGNOSIS — E785 Hyperlipidemia, unspecified: Secondary | ICD-10-CM | POA: Diagnosis not present

## 2014-03-22 DIAGNOSIS — R1310 Dysphagia, unspecified: Secondary | ICD-10-CM | POA: Diagnosis not present

## 2014-03-24 DIAGNOSIS — E039 Hypothyroidism, unspecified: Secondary | ICD-10-CM | POA: Diagnosis not present

## 2014-03-24 DIAGNOSIS — E785 Hyperlipidemia, unspecified: Secondary | ICD-10-CM | POA: Diagnosis not present

## 2014-03-24 DIAGNOSIS — M6281 Muscle weakness (generalized): Secondary | ICD-10-CM | POA: Diagnosis not present

## 2014-03-24 DIAGNOSIS — R1310 Dysphagia, unspecified: Secondary | ICD-10-CM | POA: Diagnosis not present

## 2014-03-24 DIAGNOSIS — D649 Anemia, unspecified: Secondary | ICD-10-CM | POA: Diagnosis not present

## 2014-03-24 DIAGNOSIS — G40909 Epilepsy, unspecified, not intractable, without status epilepticus: Secondary | ICD-10-CM | POA: Diagnosis not present

## 2014-03-25 DIAGNOSIS — G40909 Epilepsy, unspecified, not intractable, without status epilepticus: Secondary | ICD-10-CM | POA: Diagnosis not present

## 2014-03-25 DIAGNOSIS — D649 Anemia, unspecified: Secondary | ICD-10-CM | POA: Diagnosis not present

## 2014-03-25 DIAGNOSIS — E039 Hypothyroidism, unspecified: Secondary | ICD-10-CM | POA: Diagnosis not present

## 2014-03-25 DIAGNOSIS — R1310 Dysphagia, unspecified: Secondary | ICD-10-CM | POA: Diagnosis not present

## 2014-03-25 DIAGNOSIS — M6281 Muscle weakness (generalized): Secondary | ICD-10-CM | POA: Diagnosis not present

## 2014-03-25 DIAGNOSIS — E785 Hyperlipidemia, unspecified: Secondary | ICD-10-CM | POA: Diagnosis not present

## 2014-03-27 DIAGNOSIS — D649 Anemia, unspecified: Secondary | ICD-10-CM | POA: Diagnosis not present

## 2014-03-27 DIAGNOSIS — M6281 Muscle weakness (generalized): Secondary | ICD-10-CM | POA: Diagnosis not present

## 2014-03-27 DIAGNOSIS — G40909 Epilepsy, unspecified, not intractable, without status epilepticus: Secondary | ICD-10-CM | POA: Diagnosis not present

## 2014-03-27 DIAGNOSIS — R1310 Dysphagia, unspecified: Secondary | ICD-10-CM | POA: Diagnosis not present

## 2014-03-27 DIAGNOSIS — E785 Hyperlipidemia, unspecified: Secondary | ICD-10-CM | POA: Diagnosis not present

## 2014-03-27 DIAGNOSIS — E039 Hypothyroidism, unspecified: Secondary | ICD-10-CM | POA: Diagnosis not present

## 2014-04-08 DIAGNOSIS — E034 Atrophy of thyroid (acquired): Secondary | ICD-10-CM | POA: Diagnosis not present

## 2014-04-08 DIAGNOSIS — E291 Testicular hypofunction: Secondary | ICD-10-CM | POA: Diagnosis not present

## 2014-04-08 DIAGNOSIS — R498 Other voice and resonance disorders: Secondary | ICD-10-CM | POA: Diagnosis not present

## 2014-04-08 DIAGNOSIS — R569 Unspecified convulsions: Secondary | ICD-10-CM | POA: Diagnosis not present

## 2014-04-08 DIAGNOSIS — E782 Mixed hyperlipidemia: Secondary | ICD-10-CM | POA: Diagnosis not present

## 2014-04-23 DIAGNOSIS — R260 Ataxic gait: Secondary | ICD-10-CM | POA: Diagnosis not present

## 2014-04-23 DIAGNOSIS — R498 Other voice and resonance disorders: Secondary | ICD-10-CM | POA: Diagnosis not present

## 2014-04-23 DIAGNOSIS — R569 Unspecified convulsions: Secondary | ICD-10-CM | POA: Diagnosis not present

## 2014-04-24 DIAGNOSIS — R229 Localized swelling, mass and lump, unspecified: Secondary | ICD-10-CM | POA: Diagnosis not present

## 2014-04-24 DIAGNOSIS — Z79899 Other long term (current) drug therapy: Secondary | ICD-10-CM | POA: Diagnosis not present

## 2014-05-13 DIAGNOSIS — R740 Nonspecific elevation of levels of transaminase and lactic acid dehydrogenase [LDH]: Secondary | ICD-10-CM | POA: Diagnosis not present

## 2014-05-13 DIAGNOSIS — Z79899 Other long term (current) drug therapy: Secondary | ICD-10-CM | POA: Diagnosis not present

## 2014-05-19 DIAGNOSIS — R609 Edema, unspecified: Secondary | ICD-10-CM | POA: Diagnosis not present

## 2014-05-19 DIAGNOSIS — M7989 Other specified soft tissue disorders: Secondary | ICD-10-CM | POA: Diagnosis not present

## 2014-05-19 DIAGNOSIS — S8991XA Unspecified injury of right lower leg, initial encounter: Secondary | ICD-10-CM | POA: Diagnosis not present

## 2014-05-19 DIAGNOSIS — R260 Ataxic gait: Secondary | ICD-10-CM | POA: Diagnosis not present

## 2014-05-19 DIAGNOSIS — R569 Unspecified convulsions: Secondary | ICD-10-CM | POA: Diagnosis not present

## 2014-05-19 DIAGNOSIS — R498 Other voice and resonance disorders: Secondary | ICD-10-CM | POA: Diagnosis not present

## 2014-05-19 DIAGNOSIS — S86911A Strain of unspecified muscle(s) and tendon(s) at lower leg level, right leg, initial encounter: Secondary | ICD-10-CM | POA: Diagnosis not present

## 2014-05-28 DIAGNOSIS — S8991XA Unspecified injury of right lower leg, initial encounter: Secondary | ICD-10-CM | POA: Diagnosis not present

## 2014-05-28 DIAGNOSIS — S86911A Strain of unspecified muscle(s) and tendon(s) at lower leg level, right leg, initial encounter: Secondary | ICD-10-CM | POA: Diagnosis not present

## 2014-05-28 DIAGNOSIS — M7989 Other specified soft tissue disorders: Secondary | ICD-10-CM | POA: Diagnosis not present

## 2014-05-28 DIAGNOSIS — R609 Edema, unspecified: Secondary | ICD-10-CM | POA: Diagnosis not present

## 2014-06-10 DIAGNOSIS — R600 Localized edema: Secondary | ICD-10-CM | POA: Diagnosis not present

## 2014-06-10 DIAGNOSIS — M7120 Synovial cyst of popliteal space [Baker], unspecified knee: Secondary | ICD-10-CM | POA: Diagnosis not present

## 2014-06-17 DIAGNOSIS — M25561 Pain in right knee: Secondary | ICD-10-CM | POA: Diagnosis not present

## 2014-06-18 DIAGNOSIS — R569 Unspecified convulsions: Secondary | ICD-10-CM | POA: Diagnosis not present

## 2014-06-18 DIAGNOSIS — R498 Other voice and resonance disorders: Secondary | ICD-10-CM | POA: Diagnosis not present

## 2014-06-23 DIAGNOSIS — R498 Other voice and resonance disorders: Secondary | ICD-10-CM | POA: Diagnosis not present

## 2014-06-23 DIAGNOSIS — R569 Unspecified convulsions: Secondary | ICD-10-CM | POA: Diagnosis not present

## 2014-06-25 DIAGNOSIS — R569 Unspecified convulsions: Secondary | ICD-10-CM | POA: Diagnosis not present

## 2014-06-25 DIAGNOSIS — R498 Other voice and resonance disorders: Secondary | ICD-10-CM | POA: Diagnosis not present

## 2014-06-30 DIAGNOSIS — R569 Unspecified convulsions: Secondary | ICD-10-CM | POA: Diagnosis not present

## 2014-06-30 DIAGNOSIS — R498 Other voice and resonance disorders: Secondary | ICD-10-CM | POA: Diagnosis not present

## 2014-07-02 DIAGNOSIS — R498 Other voice and resonance disorders: Secondary | ICD-10-CM | POA: Diagnosis not present

## 2014-07-02 DIAGNOSIS — R569 Unspecified convulsions: Secondary | ICD-10-CM | POA: Diagnosis not present

## 2014-07-07 DIAGNOSIS — R498 Other voice and resonance disorders: Secondary | ICD-10-CM | POA: Diagnosis not present

## 2014-07-07 DIAGNOSIS — R569 Unspecified convulsions: Secondary | ICD-10-CM | POA: Diagnosis not present

## 2014-07-09 DIAGNOSIS — E782 Mixed hyperlipidemia: Secondary | ICD-10-CM | POA: Diagnosis not present

## 2014-07-09 DIAGNOSIS — E291 Testicular hypofunction: Secondary | ICD-10-CM | POA: Diagnosis not present

## 2014-07-09 DIAGNOSIS — E034 Atrophy of thyroid (acquired): Secondary | ICD-10-CM | POA: Diagnosis not present

## 2014-07-09 DIAGNOSIS — R498 Other voice and resonance disorders: Secondary | ICD-10-CM | POA: Diagnosis not present

## 2014-07-09 DIAGNOSIS — R569 Unspecified convulsions: Secondary | ICD-10-CM | POA: Diagnosis not present

## 2014-07-16 DIAGNOSIS — R498 Other voice and resonance disorders: Secondary | ICD-10-CM | POA: Diagnosis not present

## 2014-07-16 DIAGNOSIS — R569 Unspecified convulsions: Secondary | ICD-10-CM | POA: Diagnosis not present

## 2014-07-23 DIAGNOSIS — R569 Unspecified convulsions: Secondary | ICD-10-CM | POA: Diagnosis not present

## 2014-07-23 DIAGNOSIS — R498 Other voice and resonance disorders: Secondary | ICD-10-CM | POA: Diagnosis not present

## 2014-07-23 DIAGNOSIS — R260 Ataxic gait: Secondary | ICD-10-CM | POA: Diagnosis not present

## 2014-07-30 DIAGNOSIS — R569 Unspecified convulsions: Secondary | ICD-10-CM | POA: Diagnosis not present

## 2014-07-30 DIAGNOSIS — R498 Other voice and resonance disorders: Secondary | ICD-10-CM | POA: Diagnosis not present

## 2014-07-30 DIAGNOSIS — R260 Ataxic gait: Secondary | ICD-10-CM | POA: Diagnosis not present

## 2014-08-04 DIAGNOSIS — R260 Ataxic gait: Secondary | ICD-10-CM | POA: Diagnosis not present

## 2014-08-04 DIAGNOSIS — R498 Other voice and resonance disorders: Secondary | ICD-10-CM | POA: Diagnosis not present

## 2014-08-04 DIAGNOSIS — R569 Unspecified convulsions: Secondary | ICD-10-CM | POA: Diagnosis not present

## 2014-08-05 DIAGNOSIS — H5089 Other specified strabismus: Secondary | ICD-10-CM | POA: Insufficient documentation

## 2014-08-05 DIAGNOSIS — R569 Unspecified convulsions: Secondary | ICD-10-CM | POA: Diagnosis not present

## 2014-08-05 DIAGNOSIS — H524 Presbyopia: Secondary | ICD-10-CM | POA: Diagnosis not present

## 2014-08-06 DIAGNOSIS — R569 Unspecified convulsions: Secondary | ICD-10-CM | POA: Diagnosis not present

## 2014-08-06 DIAGNOSIS — R498 Other voice and resonance disorders: Secondary | ICD-10-CM | POA: Diagnosis not present

## 2014-08-06 DIAGNOSIS — R260 Ataxic gait: Secondary | ICD-10-CM | POA: Diagnosis not present

## 2014-08-25 DIAGNOSIS — K648 Other hemorrhoids: Secondary | ICD-10-CM | POA: Diagnosis not present

## 2014-08-25 DIAGNOSIS — K921 Melena: Secondary | ICD-10-CM | POA: Diagnosis not present

## 2014-09-12 DIAGNOSIS — R262 Difficulty in walking, not elsewhere classified: Secondary | ICD-10-CM | POA: Diagnosis not present

## 2014-09-12 DIAGNOSIS — M6281 Muscle weakness (generalized): Secondary | ICD-10-CM | POA: Diagnosis not present

## 2014-09-12 DIAGNOSIS — R569 Unspecified convulsions: Secondary | ICD-10-CM | POA: Diagnosis not present

## 2014-09-15 DIAGNOSIS — R569 Unspecified convulsions: Secondary | ICD-10-CM | POA: Diagnosis not present

## 2014-09-15 DIAGNOSIS — M6281 Muscle weakness (generalized): Secondary | ICD-10-CM | POA: Diagnosis not present

## 2014-09-15 DIAGNOSIS — R262 Difficulty in walking, not elsewhere classified: Secondary | ICD-10-CM | POA: Diagnosis not present

## 2014-09-17 DIAGNOSIS — R262 Difficulty in walking, not elsewhere classified: Secondary | ICD-10-CM | POA: Diagnosis not present

## 2014-09-17 DIAGNOSIS — M6281 Muscle weakness (generalized): Secondary | ICD-10-CM | POA: Diagnosis not present

## 2014-09-17 DIAGNOSIS — R569 Unspecified convulsions: Secondary | ICD-10-CM | POA: Diagnosis not present

## 2014-09-19 DIAGNOSIS — M6281 Muscle weakness (generalized): Secondary | ICD-10-CM | POA: Diagnosis not present

## 2014-09-19 DIAGNOSIS — R262 Difficulty in walking, not elsewhere classified: Secondary | ICD-10-CM | POA: Diagnosis not present

## 2014-09-19 DIAGNOSIS — R569 Unspecified convulsions: Secondary | ICD-10-CM | POA: Diagnosis not present

## 2014-09-22 DIAGNOSIS — R569 Unspecified convulsions: Secondary | ICD-10-CM | POA: Diagnosis not present

## 2014-09-22 DIAGNOSIS — M6281 Muscle weakness (generalized): Secondary | ICD-10-CM | POA: Diagnosis not present

## 2014-09-22 DIAGNOSIS — R262 Difficulty in walking, not elsewhere classified: Secondary | ICD-10-CM | POA: Diagnosis not present

## 2014-09-24 DIAGNOSIS — R262 Difficulty in walking, not elsewhere classified: Secondary | ICD-10-CM | POA: Diagnosis not present

## 2014-09-24 DIAGNOSIS — M6281 Muscle weakness (generalized): Secondary | ICD-10-CM | POA: Diagnosis not present

## 2014-09-24 DIAGNOSIS — R569 Unspecified convulsions: Secondary | ICD-10-CM | POA: Diagnosis not present

## 2014-09-26 DIAGNOSIS — R262 Difficulty in walking, not elsewhere classified: Secondary | ICD-10-CM | POA: Diagnosis not present

## 2014-09-26 DIAGNOSIS — R569 Unspecified convulsions: Secondary | ICD-10-CM | POA: Diagnosis not present

## 2014-09-26 DIAGNOSIS — M6281 Muscle weakness (generalized): Secondary | ICD-10-CM | POA: Diagnosis not present

## 2014-09-29 DIAGNOSIS — R569 Unspecified convulsions: Secondary | ICD-10-CM | POA: Diagnosis not present

## 2014-09-29 DIAGNOSIS — R262 Difficulty in walking, not elsewhere classified: Secondary | ICD-10-CM | POA: Diagnosis not present

## 2014-09-29 DIAGNOSIS — M6281 Muscle weakness (generalized): Secondary | ICD-10-CM | POA: Diagnosis not present

## 2014-10-01 DIAGNOSIS — R262 Difficulty in walking, not elsewhere classified: Secondary | ICD-10-CM | POA: Diagnosis not present

## 2014-10-01 DIAGNOSIS — R569 Unspecified convulsions: Secondary | ICD-10-CM | POA: Diagnosis not present

## 2014-10-01 DIAGNOSIS — M6281 Muscle weakness (generalized): Secondary | ICD-10-CM | POA: Diagnosis not present

## 2014-10-03 DIAGNOSIS — M6281 Muscle weakness (generalized): Secondary | ICD-10-CM | POA: Diagnosis not present

## 2014-10-03 DIAGNOSIS — R569 Unspecified convulsions: Secondary | ICD-10-CM | POA: Diagnosis not present

## 2014-10-03 DIAGNOSIS — R262 Difficulty in walking, not elsewhere classified: Secondary | ICD-10-CM | POA: Diagnosis not present

## 2014-10-06 DIAGNOSIS — R569 Unspecified convulsions: Secondary | ICD-10-CM | POA: Diagnosis not present

## 2014-10-06 DIAGNOSIS — M6281 Muscle weakness (generalized): Secondary | ICD-10-CM | POA: Diagnosis not present

## 2014-10-06 DIAGNOSIS — R262 Difficulty in walking, not elsewhere classified: Secondary | ICD-10-CM | POA: Diagnosis not present

## 2014-10-08 DIAGNOSIS — R569 Unspecified convulsions: Secondary | ICD-10-CM | POA: Diagnosis not present

## 2014-10-08 DIAGNOSIS — M6281 Muscle weakness (generalized): Secondary | ICD-10-CM | POA: Diagnosis not present

## 2014-10-08 DIAGNOSIS — R262 Difficulty in walking, not elsewhere classified: Secondary | ICD-10-CM | POA: Diagnosis not present

## 2014-10-10 DIAGNOSIS — M6281 Muscle weakness (generalized): Secondary | ICD-10-CM | POA: Diagnosis not present

## 2014-10-10 DIAGNOSIS — R569 Unspecified convulsions: Secondary | ICD-10-CM | POA: Diagnosis not present

## 2014-10-10 DIAGNOSIS — R262 Difficulty in walking, not elsewhere classified: Secondary | ICD-10-CM | POA: Diagnosis not present

## 2014-10-13 DIAGNOSIS — R569 Unspecified convulsions: Secondary | ICD-10-CM | POA: Diagnosis not present

## 2014-10-13 DIAGNOSIS — M6281 Muscle weakness (generalized): Secondary | ICD-10-CM | POA: Diagnosis not present

## 2014-10-13 DIAGNOSIS — R262 Difficulty in walking, not elsewhere classified: Secondary | ICD-10-CM | POA: Diagnosis not present

## 2014-10-15 DIAGNOSIS — M6281 Muscle weakness (generalized): Secondary | ICD-10-CM | POA: Diagnosis not present

## 2014-10-15 DIAGNOSIS — R569 Unspecified convulsions: Secondary | ICD-10-CM | POA: Diagnosis not present

## 2014-10-15 DIAGNOSIS — R262 Difficulty in walking, not elsewhere classified: Secondary | ICD-10-CM | POA: Diagnosis not present

## 2014-10-16 DIAGNOSIS — Z79899 Other long term (current) drug therapy: Secondary | ICD-10-CM | POA: Diagnosis not present

## 2014-10-16 DIAGNOSIS — R569 Unspecified convulsions: Secondary | ICD-10-CM | POA: Diagnosis not present

## 2014-10-16 DIAGNOSIS — E782 Mixed hyperlipidemia: Secondary | ICD-10-CM | POA: Diagnosis not present

## 2014-10-16 DIAGNOSIS — Z23 Encounter for immunization: Secondary | ICD-10-CM | POA: Diagnosis not present

## 2014-10-16 DIAGNOSIS — E034 Atrophy of thyroid (acquired): Secondary | ICD-10-CM | POA: Diagnosis not present

## 2014-10-16 DIAGNOSIS — E291 Testicular hypofunction: Secondary | ICD-10-CM | POA: Diagnosis not present

## 2014-10-17 DIAGNOSIS — R262 Difficulty in walking, not elsewhere classified: Secondary | ICD-10-CM | POA: Diagnosis not present

## 2014-10-17 DIAGNOSIS — M6281 Muscle weakness (generalized): Secondary | ICD-10-CM | POA: Diagnosis not present

## 2014-10-17 DIAGNOSIS — R569 Unspecified convulsions: Secondary | ICD-10-CM | POA: Diagnosis not present

## 2014-10-20 DIAGNOSIS — R262 Difficulty in walking, not elsewhere classified: Secondary | ICD-10-CM | POA: Diagnosis not present

## 2014-10-20 DIAGNOSIS — M6281 Muscle weakness (generalized): Secondary | ICD-10-CM | POA: Diagnosis not present

## 2014-10-20 DIAGNOSIS — R569 Unspecified convulsions: Secondary | ICD-10-CM | POA: Diagnosis not present

## 2014-10-22 DIAGNOSIS — R569 Unspecified convulsions: Secondary | ICD-10-CM | POA: Diagnosis not present

## 2014-10-22 DIAGNOSIS — R262 Difficulty in walking, not elsewhere classified: Secondary | ICD-10-CM | POA: Diagnosis not present

## 2014-10-22 DIAGNOSIS — M6281 Muscle weakness (generalized): Secondary | ICD-10-CM | POA: Diagnosis not present

## 2014-10-24 DIAGNOSIS — M6281 Muscle weakness (generalized): Secondary | ICD-10-CM | POA: Diagnosis not present

## 2014-10-24 DIAGNOSIS — R262 Difficulty in walking, not elsewhere classified: Secondary | ICD-10-CM | POA: Diagnosis not present

## 2014-10-24 DIAGNOSIS — R569 Unspecified convulsions: Secondary | ICD-10-CM | POA: Diagnosis not present

## 2014-10-27 DIAGNOSIS — R262 Difficulty in walking, not elsewhere classified: Secondary | ICD-10-CM | POA: Diagnosis not present

## 2014-10-27 DIAGNOSIS — R569 Unspecified convulsions: Secondary | ICD-10-CM | POA: Diagnosis not present

## 2014-10-27 DIAGNOSIS — M6281 Muscle weakness (generalized): Secondary | ICD-10-CM | POA: Diagnosis not present

## 2014-10-29 DIAGNOSIS — M6281 Muscle weakness (generalized): Secondary | ICD-10-CM | POA: Diagnosis not present

## 2014-10-29 DIAGNOSIS — R262 Difficulty in walking, not elsewhere classified: Secondary | ICD-10-CM | POA: Diagnosis not present

## 2014-10-29 DIAGNOSIS — R569 Unspecified convulsions: Secondary | ICD-10-CM | POA: Diagnosis not present

## 2014-10-31 DIAGNOSIS — R262 Difficulty in walking, not elsewhere classified: Secondary | ICD-10-CM | POA: Diagnosis not present

## 2014-10-31 DIAGNOSIS — M6281 Muscle weakness (generalized): Secondary | ICD-10-CM | POA: Diagnosis not present

## 2014-10-31 DIAGNOSIS — R569 Unspecified convulsions: Secondary | ICD-10-CM | POA: Diagnosis not present

## 2014-11-03 DIAGNOSIS — R569 Unspecified convulsions: Secondary | ICD-10-CM | POA: Diagnosis not present

## 2014-11-03 DIAGNOSIS — M6281 Muscle weakness (generalized): Secondary | ICD-10-CM | POA: Diagnosis not present

## 2014-11-03 DIAGNOSIS — R262 Difficulty in walking, not elsewhere classified: Secondary | ICD-10-CM | POA: Diagnosis not present

## 2014-11-05 DIAGNOSIS — R262 Difficulty in walking, not elsewhere classified: Secondary | ICD-10-CM | POA: Diagnosis not present

## 2014-11-05 DIAGNOSIS — R569 Unspecified convulsions: Secondary | ICD-10-CM | POA: Diagnosis not present

## 2014-11-05 DIAGNOSIS — M6281 Muscle weakness (generalized): Secondary | ICD-10-CM | POA: Diagnosis not present

## 2014-11-07 DIAGNOSIS — R569 Unspecified convulsions: Secondary | ICD-10-CM | POA: Diagnosis not present

## 2014-11-07 DIAGNOSIS — M6281 Muscle weakness (generalized): Secondary | ICD-10-CM | POA: Diagnosis not present

## 2014-11-07 DIAGNOSIS — R262 Difficulty in walking, not elsewhere classified: Secondary | ICD-10-CM | POA: Diagnosis not present

## 2015-01-23 DIAGNOSIS — E291 Testicular hypofunction: Secondary | ICD-10-CM | POA: Diagnosis not present

## 2015-01-23 DIAGNOSIS — E034 Atrophy of thyroid (acquired): Secondary | ICD-10-CM | POA: Diagnosis not present

## 2015-01-23 DIAGNOSIS — R569 Unspecified convulsions: Secondary | ICD-10-CM | POA: Diagnosis not present

## 2015-01-23 DIAGNOSIS — E782 Mixed hyperlipidemia: Secondary | ICD-10-CM | POA: Diagnosis not present

## 2015-04-24 DIAGNOSIS — R569 Unspecified convulsions: Secondary | ICD-10-CM | POA: Diagnosis not present

## 2015-04-24 DIAGNOSIS — E291 Testicular hypofunction: Secondary | ICD-10-CM | POA: Diagnosis not present

## 2015-04-24 DIAGNOSIS — E034 Atrophy of thyroid (acquired): Secondary | ICD-10-CM | POA: Diagnosis not present

## 2015-04-24 DIAGNOSIS — E782 Mixed hyperlipidemia: Secondary | ICD-10-CM | POA: Diagnosis not present

## 2015-07-24 DIAGNOSIS — E782 Mixed hyperlipidemia: Secondary | ICD-10-CM | POA: Diagnosis not present

## 2015-07-24 DIAGNOSIS — Z79899 Other long term (current) drug therapy: Secondary | ICD-10-CM | POA: Diagnosis not present

## 2015-07-24 DIAGNOSIS — R569 Unspecified convulsions: Secondary | ICD-10-CM | POA: Diagnosis not present

## 2015-07-24 DIAGNOSIS — Z135 Encounter for screening for eye and ear disorders: Secondary | ICD-10-CM | POA: Diagnosis not present

## 2015-07-24 DIAGNOSIS — E291 Testicular hypofunction: Secondary | ICD-10-CM | POA: Diagnosis not present

## 2015-07-24 DIAGNOSIS — G4089 Other seizures: Secondary | ICD-10-CM | POA: Diagnosis not present

## 2015-07-24 DIAGNOSIS — E034 Atrophy of thyroid (acquired): Secondary | ICD-10-CM | POA: Diagnosis not present

## 2015-07-24 DIAGNOSIS — H9193 Unspecified hearing loss, bilateral: Secondary | ICD-10-CM | POA: Diagnosis not present

## 2015-08-06 DIAGNOSIS — R799 Abnormal finding of blood chemistry, unspecified: Secondary | ICD-10-CM | POA: Diagnosis not present

## 2015-08-12 DIAGNOSIS — Z8673 Personal history of transient ischemic attack (TIA), and cerebral infarction without residual deficits: Secondary | ICD-10-CM | POA: Diagnosis not present

## 2015-08-12 DIAGNOSIS — H5089 Other specified strabismus: Secondary | ICD-10-CM | POA: Diagnosis not present

## 2015-08-12 DIAGNOSIS — H524 Presbyopia: Secondary | ICD-10-CM | POA: Diagnosis not present

## 2015-10-26 DIAGNOSIS — G4089 Other seizures: Secondary | ICD-10-CM | POA: Diagnosis not present

## 2015-10-26 DIAGNOSIS — R21 Rash and other nonspecific skin eruption: Secondary | ICD-10-CM | POA: Diagnosis not present

## 2015-10-26 DIAGNOSIS — E782 Mixed hyperlipidemia: Secondary | ICD-10-CM | POA: Diagnosis not present

## 2015-10-26 DIAGNOSIS — Z23 Encounter for immunization: Secondary | ICD-10-CM | POA: Diagnosis not present

## 2015-10-26 DIAGNOSIS — E034 Atrophy of thyroid (acquired): Secondary | ICD-10-CM | POA: Diagnosis not present

## 2015-10-26 DIAGNOSIS — E291 Testicular hypofunction: Secondary | ICD-10-CM | POA: Diagnosis not present

## 2016-01-27 DIAGNOSIS — E034 Atrophy of thyroid (acquired): Secondary | ICD-10-CM | POA: Diagnosis not present

## 2016-01-27 DIAGNOSIS — E291 Testicular hypofunction: Secondary | ICD-10-CM | POA: Diagnosis not present

## 2016-01-27 DIAGNOSIS — G4089 Other seizures: Secondary | ICD-10-CM | POA: Diagnosis not present

## 2016-01-27 DIAGNOSIS — E782 Mixed hyperlipidemia: Secondary | ICD-10-CM | POA: Diagnosis not present

## 2016-02-01 DIAGNOSIS — R262 Difficulty in walking, not elsewhere classified: Secondary | ICD-10-CM | POA: Diagnosis not present

## 2016-02-01 DIAGNOSIS — R569 Unspecified convulsions: Secondary | ICD-10-CM | POA: Diagnosis not present

## 2016-02-01 DIAGNOSIS — D320 Benign neoplasm of cerebral meninges: Secondary | ICD-10-CM | POA: Diagnosis not present

## 2016-02-01 DIAGNOSIS — G9340 Encephalopathy, unspecified: Secondary | ICD-10-CM | POA: Diagnosis not present

## 2016-02-01 DIAGNOSIS — C716 Malignant neoplasm of cerebellum: Secondary | ICD-10-CM | POA: Diagnosis not present

## 2016-02-08 DIAGNOSIS — C716 Malignant neoplasm of cerebellum: Secondary | ICD-10-CM | POA: Diagnosis not present

## 2016-02-08 DIAGNOSIS — D320 Benign neoplasm of cerebral meninges: Secondary | ICD-10-CM | POA: Diagnosis not present

## 2016-02-08 DIAGNOSIS — R569 Unspecified convulsions: Secondary | ICD-10-CM | POA: Diagnosis not present

## 2016-02-08 DIAGNOSIS — G9340 Encephalopathy, unspecified: Secondary | ICD-10-CM | POA: Diagnosis not present

## 2016-02-08 DIAGNOSIS — R262 Difficulty in walking, not elsewhere classified: Secondary | ICD-10-CM | POA: Diagnosis not present

## 2016-02-16 DIAGNOSIS — R569 Unspecified convulsions: Secondary | ICD-10-CM | POA: Diagnosis not present

## 2016-02-16 DIAGNOSIS — R262 Difficulty in walking, not elsewhere classified: Secondary | ICD-10-CM | POA: Diagnosis not present

## 2016-02-16 DIAGNOSIS — D320 Benign neoplasm of cerebral meninges: Secondary | ICD-10-CM | POA: Diagnosis not present

## 2016-02-16 DIAGNOSIS — C716 Malignant neoplasm of cerebellum: Secondary | ICD-10-CM | POA: Diagnosis not present

## 2016-02-16 DIAGNOSIS — G9340 Encephalopathy, unspecified: Secondary | ICD-10-CM | POA: Diagnosis not present

## 2016-02-18 DIAGNOSIS — G9340 Encephalopathy, unspecified: Secondary | ICD-10-CM | POA: Diagnosis not present

## 2016-02-18 DIAGNOSIS — R262 Difficulty in walking, not elsewhere classified: Secondary | ICD-10-CM | POA: Diagnosis not present

## 2016-02-18 DIAGNOSIS — C716 Malignant neoplasm of cerebellum: Secondary | ICD-10-CM | POA: Diagnosis not present

## 2016-02-18 DIAGNOSIS — R569 Unspecified convulsions: Secondary | ICD-10-CM | POA: Diagnosis not present

## 2016-02-18 DIAGNOSIS — D320 Benign neoplasm of cerebral meninges: Secondary | ICD-10-CM | POA: Diagnosis not present

## 2016-02-25 DIAGNOSIS — R569 Unspecified convulsions: Secondary | ICD-10-CM | POA: Diagnosis not present

## 2016-02-25 DIAGNOSIS — C716 Malignant neoplasm of cerebellum: Secondary | ICD-10-CM | POA: Diagnosis not present

## 2016-02-25 DIAGNOSIS — G9340 Encephalopathy, unspecified: Secondary | ICD-10-CM | POA: Diagnosis not present

## 2016-02-25 DIAGNOSIS — D320 Benign neoplasm of cerebral meninges: Secondary | ICD-10-CM | POA: Diagnosis not present

## 2016-02-25 DIAGNOSIS — R262 Difficulty in walking, not elsewhere classified: Secondary | ICD-10-CM | POA: Diagnosis not present

## 2016-03-08 DIAGNOSIS — R262 Difficulty in walking, not elsewhere classified: Secondary | ICD-10-CM | POA: Diagnosis not present

## 2016-03-08 DIAGNOSIS — C716 Malignant neoplasm of cerebellum: Secondary | ICD-10-CM | POA: Diagnosis not present

## 2016-03-08 DIAGNOSIS — R569 Unspecified convulsions: Secondary | ICD-10-CM | POA: Diagnosis not present

## 2016-03-08 DIAGNOSIS — G9340 Encephalopathy, unspecified: Secondary | ICD-10-CM | POA: Diagnosis not present

## 2016-03-08 DIAGNOSIS — D320 Benign neoplasm of cerebral meninges: Secondary | ICD-10-CM | POA: Diagnosis not present

## 2016-03-10 DIAGNOSIS — C716 Malignant neoplasm of cerebellum: Secondary | ICD-10-CM | POA: Diagnosis not present

## 2016-03-10 DIAGNOSIS — R569 Unspecified convulsions: Secondary | ICD-10-CM | POA: Diagnosis not present

## 2016-03-10 DIAGNOSIS — D320 Benign neoplasm of cerebral meninges: Secondary | ICD-10-CM | POA: Diagnosis not present

## 2016-03-10 DIAGNOSIS — G9340 Encephalopathy, unspecified: Secondary | ICD-10-CM | POA: Diagnosis not present

## 2016-03-10 DIAGNOSIS — R262 Difficulty in walking, not elsewhere classified: Secondary | ICD-10-CM | POA: Diagnosis not present

## 2016-03-15 DIAGNOSIS — D320 Benign neoplasm of cerebral meninges: Secondary | ICD-10-CM | POA: Diagnosis not present

## 2016-03-15 DIAGNOSIS — R262 Difficulty in walking, not elsewhere classified: Secondary | ICD-10-CM | POA: Diagnosis not present

## 2016-03-15 DIAGNOSIS — C716 Malignant neoplasm of cerebellum: Secondary | ICD-10-CM | POA: Diagnosis not present

## 2016-03-15 DIAGNOSIS — G9340 Encephalopathy, unspecified: Secondary | ICD-10-CM | POA: Diagnosis not present

## 2016-03-15 DIAGNOSIS — R569 Unspecified convulsions: Secondary | ICD-10-CM | POA: Diagnosis not present

## 2016-03-18 DIAGNOSIS — R262 Difficulty in walking, not elsewhere classified: Secondary | ICD-10-CM | POA: Diagnosis not present

## 2016-03-18 DIAGNOSIS — G9340 Encephalopathy, unspecified: Secondary | ICD-10-CM | POA: Diagnosis not present

## 2016-03-18 DIAGNOSIS — R569 Unspecified convulsions: Secondary | ICD-10-CM | POA: Diagnosis not present

## 2016-03-18 DIAGNOSIS — D320 Benign neoplasm of cerebral meninges: Secondary | ICD-10-CM | POA: Diagnosis not present

## 2016-03-18 DIAGNOSIS — C716 Malignant neoplasm of cerebellum: Secondary | ICD-10-CM | POA: Diagnosis not present

## 2016-03-23 DIAGNOSIS — D320 Benign neoplasm of cerebral meninges: Secondary | ICD-10-CM | POA: Diagnosis not present

## 2016-03-23 DIAGNOSIS — G9340 Encephalopathy, unspecified: Secondary | ICD-10-CM | POA: Diagnosis not present

## 2016-03-23 DIAGNOSIS — R569 Unspecified convulsions: Secondary | ICD-10-CM | POA: Diagnosis not present

## 2016-03-23 DIAGNOSIS — C716 Malignant neoplasm of cerebellum: Secondary | ICD-10-CM | POA: Diagnosis not present

## 2016-03-23 DIAGNOSIS — R262 Difficulty in walking, not elsewhere classified: Secondary | ICD-10-CM | POA: Diagnosis not present

## 2016-03-25 DIAGNOSIS — D320 Benign neoplasm of cerebral meninges: Secondary | ICD-10-CM | POA: Diagnosis not present

## 2016-03-25 DIAGNOSIS — C716 Malignant neoplasm of cerebellum: Secondary | ICD-10-CM | POA: Diagnosis not present

## 2016-03-25 DIAGNOSIS — G9340 Encephalopathy, unspecified: Secondary | ICD-10-CM | POA: Diagnosis not present

## 2016-03-25 DIAGNOSIS — R262 Difficulty in walking, not elsewhere classified: Secondary | ICD-10-CM | POA: Diagnosis not present

## 2016-03-25 DIAGNOSIS — R569 Unspecified convulsions: Secondary | ICD-10-CM | POA: Diagnosis not present

## 2016-03-29 DIAGNOSIS — D320 Benign neoplasm of cerebral meninges: Secondary | ICD-10-CM | POA: Diagnosis not present

## 2016-03-29 DIAGNOSIS — G9340 Encephalopathy, unspecified: Secondary | ICD-10-CM | POA: Diagnosis not present

## 2016-03-29 DIAGNOSIS — R262 Difficulty in walking, not elsewhere classified: Secondary | ICD-10-CM | POA: Diagnosis not present

## 2016-03-29 DIAGNOSIS — R569 Unspecified convulsions: Secondary | ICD-10-CM | POA: Diagnosis not present

## 2016-03-29 DIAGNOSIS — C716 Malignant neoplasm of cerebellum: Secondary | ICD-10-CM | POA: Diagnosis not present

## 2016-04-27 DIAGNOSIS — G4089 Other seizures: Secondary | ICD-10-CM | POA: Diagnosis not present

## 2016-04-27 DIAGNOSIS — I781 Nevus, non-neoplastic: Secondary | ICD-10-CM | POA: Diagnosis not present

## 2016-04-27 DIAGNOSIS — E291 Testicular hypofunction: Secondary | ICD-10-CM | POA: Diagnosis not present

## 2016-04-27 DIAGNOSIS — E782 Mixed hyperlipidemia: Secondary | ICD-10-CM | POA: Diagnosis not present

## 2016-04-27 DIAGNOSIS — E034 Atrophy of thyroid (acquired): Secondary | ICD-10-CM | POA: Diagnosis not present

## 2016-05-05 DIAGNOSIS — K219 Gastro-esophageal reflux disease without esophagitis: Secondary | ICD-10-CM | POA: Diagnosis not present

## 2016-05-05 DIAGNOSIS — Z7901 Long term (current) use of anticoagulants: Secondary | ICD-10-CM | POA: Diagnosis not present

## 2016-05-05 DIAGNOSIS — K648 Other hemorrhoids: Secondary | ICD-10-CM | POA: Diagnosis not present

## 2016-05-24 DIAGNOSIS — Z1211 Encounter for screening for malignant neoplasm of colon: Secondary | ICD-10-CM | POA: Diagnosis not present

## 2016-05-24 DIAGNOSIS — Z79899 Other long term (current) drug therapy: Secondary | ICD-10-CM | POA: Diagnosis not present

## 2016-05-24 DIAGNOSIS — Z85841 Personal history of malignant neoplasm of brain: Secondary | ICD-10-CM | POA: Diagnosis not present

## 2016-05-24 DIAGNOSIS — Z8673 Personal history of transient ischemic attack (TIA), and cerebral infarction without residual deficits: Secondary | ICD-10-CM | POA: Diagnosis not present

## 2016-05-24 DIAGNOSIS — Z539 Procedure and treatment not carried out, unspecified reason: Secondary | ICD-10-CM | POA: Diagnosis not present

## 2016-05-24 DIAGNOSIS — Z86010 Personal history of colonic polyps: Secondary | ICD-10-CM | POA: Diagnosis not present

## 2016-05-24 DIAGNOSIS — Z800 Family history of malignant neoplasm of digestive organs: Secondary | ICD-10-CM | POA: Diagnosis not present

## 2016-05-25 DIAGNOSIS — Z1211 Encounter for screening for malignant neoplasm of colon: Secondary | ICD-10-CM | POA: Diagnosis not present

## 2016-05-25 DIAGNOSIS — K648 Other hemorrhoids: Secondary | ICD-10-CM | POA: Diagnosis not present

## 2016-05-25 DIAGNOSIS — Z85841 Personal history of malignant neoplasm of brain: Secondary | ICD-10-CM | POA: Diagnosis not present

## 2016-05-25 DIAGNOSIS — Z800 Family history of malignant neoplasm of digestive organs: Secondary | ICD-10-CM | POA: Diagnosis not present

## 2016-05-25 DIAGNOSIS — G40909 Epilepsy, unspecified, not intractable, without status epilepticus: Secondary | ICD-10-CM | POA: Diagnosis not present

## 2016-05-25 DIAGNOSIS — Z79899 Other long term (current) drug therapy: Secondary | ICD-10-CM | POA: Diagnosis not present

## 2016-05-25 DIAGNOSIS — Z8673 Personal history of transient ischemic attack (TIA), and cerebral infarction without residual deficits: Secondary | ICD-10-CM | POA: Diagnosis not present

## 2016-05-25 DIAGNOSIS — Z9049 Acquired absence of other specified parts of digestive tract: Secondary | ICD-10-CM | POA: Diagnosis not present

## 2016-05-25 DIAGNOSIS — Z86010 Personal history of colonic polyps: Secondary | ICD-10-CM | POA: Diagnosis not present

## 2016-05-25 LAB — HM COLONOSCOPY

## 2016-07-29 DIAGNOSIS — I781 Nevus, non-neoplastic: Secondary | ICD-10-CM | POA: Diagnosis not present

## 2016-07-29 DIAGNOSIS — E782 Mixed hyperlipidemia: Secondary | ICD-10-CM | POA: Diagnosis not present

## 2016-07-29 DIAGNOSIS — E034 Atrophy of thyroid (acquired): Secondary | ICD-10-CM | POA: Diagnosis not present

## 2016-07-29 DIAGNOSIS — G4089 Other seizures: Secondary | ICD-10-CM | POA: Diagnosis not present

## 2016-07-29 DIAGNOSIS — E291 Testicular hypofunction: Secondary | ICD-10-CM | POA: Diagnosis not present

## 2016-08-09 DIAGNOSIS — L570 Actinic keratosis: Secondary | ICD-10-CM | POA: Diagnosis not present

## 2016-08-09 DIAGNOSIS — C44319 Basal cell carcinoma of skin of other parts of face: Secondary | ICD-10-CM | POA: Diagnosis not present

## 2016-08-15 DIAGNOSIS — C44319 Basal cell carcinoma of skin of other parts of face: Secondary | ICD-10-CM | POA: Diagnosis not present

## 2016-08-16 DIAGNOSIS — H524 Presbyopia: Secondary | ICD-10-CM | POA: Diagnosis not present

## 2016-08-16 DIAGNOSIS — D320 Benign neoplasm of cerebral meninges: Secondary | ICD-10-CM | POA: Diagnosis not present

## 2016-09-01 DIAGNOSIS — C44319 Basal cell carcinoma of skin of other parts of face: Secondary | ICD-10-CM | POA: Diagnosis not present

## 2016-09-01 DIAGNOSIS — L578 Other skin changes due to chronic exposure to nonionizing radiation: Secondary | ICD-10-CM | POA: Diagnosis not present

## 2016-09-01 DIAGNOSIS — L570 Actinic keratosis: Secondary | ICD-10-CM | POA: Diagnosis not present

## 2016-09-01 DIAGNOSIS — C4441 Basal cell carcinoma of skin of scalp and neck: Secondary | ICD-10-CM | POA: Diagnosis not present

## 2016-09-12 DIAGNOSIS — C44519 Basal cell carcinoma of skin of other part of trunk: Secondary | ICD-10-CM | POA: Diagnosis not present

## 2016-09-12 DIAGNOSIS — C4441 Basal cell carcinoma of skin of scalp and neck: Secondary | ICD-10-CM | POA: Diagnosis not present

## 2016-09-13 DIAGNOSIS — J208 Acute bronchitis due to other specified organisms: Secondary | ICD-10-CM | POA: Diagnosis not present

## 2016-10-04 DIAGNOSIS — J3800 Paralysis of vocal cords and larynx, unspecified: Secondary | ICD-10-CM | POA: Diagnosis not present

## 2016-10-04 DIAGNOSIS — J180 Bronchopneumonia, unspecified organism: Secondary | ICD-10-CM | POA: Diagnosis not present

## 2016-10-04 DIAGNOSIS — M542 Cervicalgia: Secondary | ICD-10-CM | POA: Diagnosis not present

## 2016-10-04 DIAGNOSIS — R221 Localized swelling, mass and lump, neck: Secondary | ICD-10-CM | POA: Diagnosis not present

## 2016-10-04 DIAGNOSIS — R05 Cough: Secondary | ICD-10-CM | POA: Diagnosis not present

## 2016-10-06 DIAGNOSIS — E889 Metabolic disorder, unspecified: Secondary | ICD-10-CM | POA: Diagnosis not present

## 2016-10-06 DIAGNOSIS — J3800 Paralysis of vocal cords and larynx, unspecified: Secondary | ICD-10-CM | POA: Diagnosis not present

## 2016-10-06 DIAGNOSIS — J9811 Atelectasis: Secondary | ICD-10-CM | POA: Diagnosis not present

## 2016-10-17 DIAGNOSIS — R05 Cough: Secondary | ICD-10-CM | POA: Diagnosis not present

## 2016-10-17 DIAGNOSIS — J158 Pneumonia due to other specified bacteria: Secondary | ICD-10-CM | POA: Diagnosis not present

## 2016-10-19 DIAGNOSIS — C44519 Basal cell carcinoma of skin of other part of trunk: Secondary | ICD-10-CM | POA: Diagnosis not present

## 2016-11-01 DIAGNOSIS — Z23 Encounter for immunization: Secondary | ICD-10-CM | POA: Diagnosis not present

## 2016-11-01 DIAGNOSIS — E291 Testicular hypofunction: Secondary | ICD-10-CM | POA: Diagnosis not present

## 2016-11-01 DIAGNOSIS — E034 Atrophy of thyroid (acquired): Secondary | ICD-10-CM | POA: Diagnosis not present

## 2016-11-01 DIAGNOSIS — G4089 Other seizures: Secondary | ICD-10-CM | POA: Diagnosis not present

## 2016-11-01 DIAGNOSIS — E782 Mixed hyperlipidemia: Secondary | ICD-10-CM | POA: Diagnosis not present

## 2017-02-01 DIAGNOSIS — E291 Testicular hypofunction: Secondary | ICD-10-CM | POA: Diagnosis not present

## 2017-02-01 DIAGNOSIS — E034 Atrophy of thyroid (acquired): Secondary | ICD-10-CM | POA: Diagnosis not present

## 2017-02-01 DIAGNOSIS — E782 Mixed hyperlipidemia: Secondary | ICD-10-CM | POA: Diagnosis not present

## 2017-02-01 DIAGNOSIS — G4089 Other seizures: Secondary | ICD-10-CM | POA: Diagnosis not present

## 2017-02-13 ENCOUNTER — Encounter: Payer: Self-pay | Admitting: Neurology

## 2017-02-13 ENCOUNTER — Ambulatory Visit: Payer: Medicare Other | Admitting: Neurology

## 2017-02-13 ENCOUNTER — Ambulatory Visit (INDEPENDENT_AMBULATORY_CARE_PROVIDER_SITE_OTHER): Payer: Medicare Other | Admitting: Neurology

## 2017-02-13 VITALS — Ht 69.2 in | Wt 201.0 lb

## 2017-02-13 DIAGNOSIS — C719 Malignant neoplasm of brain, unspecified: Secondary | ICD-10-CM

## 2017-02-13 DIAGNOSIS — R569 Unspecified convulsions: Secondary | ICD-10-CM | POA: Diagnosis not present

## 2017-02-13 MED ORDER — DIVALPROEX SODIUM ER 500 MG PO TB24: 1000.0000 mg | ORAL_TABLET | Freq: Every day | ORAL | 11 refills | Status: DC

## 2017-02-13 NOTE — Progress Notes (Signed)
PATIENT: Glen [name] DOB: [birth year]  Chief Complaint  Patient presents with  . Seizures    He is here with his mother, Glen [name].  His last seizure occurred years ago.  He has been taking levetiracetam 750mg , one tablet BID since the 1990s.  She is concerned because he has recently started having intermittent hallucinations during the night.  She is concerned that it is related to his medication.  Marland Kitchen PCP    [name], PA-C     HISTORICAL  Glen [name] is a 47 year old male, accompanied by his mother Glen [name], seen in refer by his primary care PA [name], for evaluation of seizure, Initial evaluation was on February 13, 2017.  I reviewed and summarized the referring note, he has past medical history of hyperlipidemia, hypothyroidism, testosterone deficiency, carried a diagnosis of autoimmune encephalomyelitis,  He was adopted by his mother at 60 days of age, 80, was able to review [name] record, he developed double vision, was diagnosed with fourth ventricle medulloblastoma, had surgery by Dr. [name], VP shunt replacement, this was followed by radiation therapy, he also had left parafalcine meningioma resected in 2004.  He had his first seizure in 1990s, has been treated with levetiracetam 750 mg twice a day, in September 2015, he had recurrent seizure, fever, confusion, was diagnosed with autoimmune cerebritis, was treated with Solu-Medrol 1 g daily for 5 days, and plasma exchange,  Has regained significant recovery.   He had a brain biopsy as part of his workup for autoimmune cerebritis in 2015, which was consistent with gliosis/cerebritis, extensive autoimmune workup was negative,  He also had a history of thrombocytopenia,  He had recurrent seizures, consistent of eye blinking, mouth drooling,  He was admitted to ICU in November 2015 for hypercapnic respiratory failure, decreased level of arousal, required intubation, brain MRI with contrast was suggestive of  recurrent cerebritis, EEG excluded seizure, LP showed no signs of active infection, he was empirically treated with plasmapheresis, and his level of arousal improved with the treatment, his hospital course was also complicated by vent dependent, require tracheostomy, as well as C. 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, since his diagnosis of autoimmune cerebritis, he has significant decline in his functional status, with rehabilitation, he was eventually able to ambulate with walker, still with very unsteady ataxic gait, loss of hearing, slurred speech, difficulty reading, he had regained significant recovery from 2016, was able to begin to read his Bible again  Since November 2018, he was noted to have recurrent spells of whining from his sleep, seems to be scared, and bothered by his nightmares, but only happens at nighttime, when his mother checked on him, he was able to tell her about some dreams, there was no seizure activity noted.  Laboratory evaluations in January 2019, LDL was 91, hemoglobin was 15.2, normal CMP creatinine of 1.1, TSH was mildly elevated 5.92, testosterone level was less than 10  CAT scan of the brain 2005 following a seizure activity and possible fall, advanced atrophy with centralized volume loss, mild dilatation of the ventricular system, left frontal approach ventriculostomy, catheter tip terminated within the third ventricle, there is a ill-defined area of high attenuation adjacent to the mid aspect of ventriculostomy, within the left frontal lobe, and left basal ganglion, which likely represent intraparenchymal hemorrhage,   REVIEW OF SYSTEMS: Full 14 system review of systems performed and notable only for blurred vision, cough, wheezing, hearing  loss, slurred speech, seizure, sleepiness, snoring, hallucinations   ALLERGIES: Allergies  Allergen Reactions  . Heparin Anaphylaxis    Patient has HITT  . Lidocaine Hives    . Phenytoin Sodium Extended Rash    HOME MEDICATIONS: Current Outpatient Medications  Medication Sig Dispense Refill  . atorvastatin (LIPITOR) 20 MG tablet Take 20 mg by mouth daily.  2  . FLUoxetine (PROZAC) 20 MG capsule Take by mouth daily.  2  . fondaparinux (ARIXTRA) 2.5 MG/0.5ML SOLN injection INJECT 2.5 MG SUBCUTANEOUSLY DAILY  2  . levETIRAcetam (KEPPRA) 750 MG tablet Take 750 mg by mouth every 12 (twelve) hours.  2  . levothyroxine (SYNTHROID, LEVOTHROID) 50 MCG tablet TAKE 1 TABLET(S) BY MOUTH DAILY  2  . loratadine (CLARITIN) 10 MG tablet Take 10 mg by mouth daily.  1  . TESTOSTERONE IM Inject into the muscle. Inject every other week     No current facility-administered medications for this visit.     PAST MEDICAL HISTORY: Past Medical History:  Diagnosis Date  . Brain cancer ([place])   . Hypercholesteremia   . Seizure ([place])   . Stroke ([place])   . Thyroid disorder     PAST SURGICAL HISTORY: Past Surgical History:  Procedure Laterality Date  . APPENDECTOMY     1980s  . BRAIN SURGERY     1993    FAMILY HISTORY: Family History  Adopted: Yes    SOCIAL HISTORY:  Social History   Socioeconomic History  . Marital status: Married    Spouse name: Not on file  . Number of children: 1  . Years of education: college  . Highest education level: Not on file  Social Needs  . Financial resource strain: Not on file  . Food insecurity - worry: Not on file  . Food insecurity - inability: Not on file  . Transportation needs - medical: Not on file  . Transportation needs - non-medical: Not on file  Occupational History  . Occupation: Disabled  Tobacco Use  . Smoking status: Never Smoker  . Smokeless tobacco: Never Used  Substance and Sexual Activity  . Alcohol use: No    Frequency: Never  . Drug use: No  . Sexual activity: Not on file  Other Topics Concern  . Not on file  Social History Narrative   Lives at home with parents.   Right-handed.   Occasional  caffeine.     PHYSICAL EXAM   Vitals:   02/13/17 0951  Weight: 201 lb (91.2 kg)  Height: 5' 9.2" (1.758 m)    Not recorded      Body mass index is 29.51 kg/m.  PHYSICAL EXAMNIATION:  Gen: NAD, conversant, well nourised, obese, well groomed                     Cardiovascular: Regular rate rhythm, no peripheral edema, warm, nontender. Eyes: Conjunctivae clear without exudates or hemorrhage Neck: Supple, no carotid bruits. Pulmonary: Clear to auscultation bilaterally   NEUROLOGICAL EXAM:  MENTAL STATUS: Speech cognition: slurred speech, rely on his mother to provide history, Hard of hearing CRANIAL NERVES: CN II: Pupils are equal round reactive to light,. CN III, IV, VI: Smooth pursuit was broken into small catch-up saccade movement, end gaze horizontal rotatory nystagmus CN V: Facial sensation is intact to pinprick in all 3 divisions bilaterally. Corneal responses are intact.  CN VII: Face is symmetric with normal eye closure and smile. CN VIII: Hearing is normal to rubbing fingers CN IX, X:  Palate elevates symmetrically. Phonation is normal. CN XI: Head turning and shoulder shrug are intact CN XII: Tongue is midline with normal movements and no atrophy.  MOTOR: Normal muscle tone, strength,  REFLEXES: Reflexes are hyperactive and symmetric at the biceps, triceps, knees, and ankles. Plantar responses are flexor.  SENSORY: Intact to light touch, pinprick,  COORDINATION: He has trunk ataxia, mild to moderate finger to nose ataxia  GAIT/STANCE: He needs pushed up to get up from seated position, wide-based, cautious gait very unsteady,  DIAGNOSTIC DATA (LABS, IMAGING, TESTING) - I reviewed patient records, labs, notes, testing and imaging myself where available.   ASSESSMENT AND PLAN  Glen [name] is a 47 y.o. 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 Ph.D.  [facility] [address], [place], [place] [address] Ph: [phone] Fax: [phone]  CC: [name], PA-C

## 2017-02-20 DIAGNOSIS — C44519 Basal cell carcinoma of skin of other part of trunk: Secondary | ICD-10-CM | POA: Diagnosis not present

## 2017-02-22 ENCOUNTER — Ambulatory Visit (INDEPENDENT_AMBULATORY_CARE_PROVIDER_SITE_OTHER): Payer: Medicare Other | Admitting: Neurology

## 2017-02-22 DIAGNOSIS — R569 Unspecified convulsions: Secondary | ICD-10-CM

## 2017-02-22 DIAGNOSIS — C719 Malignant neoplasm of brain, unspecified: Secondary | ICD-10-CM

## 2017-02-24 NOTE — Procedures (Signed)
   HISTORY: 47 year old male with history of seizure,  TECHNIQUE:  16 channel EEG was performed based on standard 10-16 international system. One channel was dedicated to EKG, which has demonstrates normal sinus rhythm of 84 beats per minutes.  Upon awakening, the posterior background activity was dysarrhythmic, theta range, reactive to eye opening and closure.  There was no evidence of epileptiform discharge.  Photic stimulation was performed, which induced a symmetric photic driving.  Hyperventilation was performed, there was no abnormality elicit.  No sleep was achieved.  CONCLUSION: This is a mild abnormal EEG.  There is electrodiagnostic evidence of mild background slowing, indicating bi-hemisphere malfunction.  There is no evidence of epileptiform discharge.  Marcial Pacas, M.D. Ph.D.  [facility] [place], [place] [address] Phone: [phone] Fax:      [phone]

## 2017-02-27 DIAGNOSIS — K219 Gastro-esophageal reflux disease without esophagitis: Secondary | ICD-10-CM | POA: Diagnosis not present

## 2017-02-27 DIAGNOSIS — K648 Other hemorrhoids: Secondary | ICD-10-CM | POA: Diagnosis not present

## 2017-03-07 ENCOUNTER — Ambulatory Visit
Admission: RE | Admit: 2017-03-07 | Discharge: 2017-03-07 | Disposition: A | Discharge status: Home / Self Care | Payer: Medicare Other | Source: Ambulatory Visit | Attending: Neurology | Admitting: Neurology

## 2017-03-07 DIAGNOSIS — C719 Malignant neoplasm of brain, unspecified: Secondary | ICD-10-CM

## 2017-03-07 DIAGNOSIS — R569 Unspecified convulsions: Secondary | ICD-10-CM

## 2017-03-07 MED ORDER — GADOBENATE DIMEGLUMINE 529 MG/ML IV SOLN
20.0000 mL | Freq: Once | INTRAVENOUS | Status: AC | PRN
  Administered 2017-03-07: 19 mL via INTRAVENOUS

## 2017-03-09 ENCOUNTER — Telehealth: Payer: Self-pay | Admitting: Neurology

## 2017-03-09 NOTE — Telephone Encounter (Signed)
Left patient a detailed message, with results, on voicemail (ok per DPR).  Provided our number to call back with any questions.  

## 2017-03-09 NOTE — Telephone Encounter (Signed)
Please call patient, MRI of the brain showed evidence of previous injury, and treatment,  All above findings are chronic, I will review films with him at next follow-up visit,  Abnormal MRI brain (with and without) demonstrating: 1. Post-surgical changes with suboccipital, left frontal and right parietal-temporal craniotomies. Left frontal intraventricular shunt catheter noted as well. 2. Moderate-severe cerebellar atrophy with T2 FLAIR hyperintensities throughout, would favor post-treatment changes. 3. Chronic right parietal, temporal and occipital encephalomalacia and gliosis from prior stroke.  4. Diffuse dural (pachymeningeal) enhancement, can be seen with intracranial hypotension and intraventricular shunting. 5. No acute findings. No abnormal enhancing brain lesions.

## 2017-03-13 ENCOUNTER — Telehealth: Payer: Self-pay | Admitting: Neurology

## 2017-03-13 DIAGNOSIS — S299XXA Unspecified injury of thorax, initial encounter: Secondary | ICD-10-CM | POA: Diagnosis not present

## 2017-03-13 DIAGNOSIS — S069X9A Unspecified intracranial injury with loss of consciousness of unspecified duration, initial encounter: Secondary | ICD-10-CM | POA: Diagnosis not present

## 2017-03-13 DIAGNOSIS — R296 Repeated falls: Secondary | ICD-10-CM | POA: Diagnosis not present

## 2017-03-13 DIAGNOSIS — R42 Dizziness and giddiness: Secondary | ICD-10-CM | POA: Diagnosis not present

## 2017-03-13 DIAGNOSIS — T426X5A Adverse effect of other antiepileptic and sedative-hypnotic drugs, initial encounter: Secondary | ICD-10-CM | POA: Diagnosis not present

## 2017-03-13 NOTE — Telephone Encounter (Signed)
I received the call from the [facility] emergency room Dr. Colin Rhein, since Depakote ER 500 mg 2 tablets every night was started on February 13, 2017, he has confusion, sleepiness,  I have advised him to decrease Keppra to 250 mg twice a day,  Sharyn Lull, please check on him next Monday, to see how is he doing with low-dose combination of Keppra 250 mg twice a day and Depakote ER 500mg  2 tabs every night.

## 2017-03-14 ENCOUNTER — Encounter: Payer: Self-pay | Admitting: *Deleted

## 2017-03-14 NOTE — Telephone Encounter (Signed)
Patient will need to be called on Monday, March 4th to see how he is doing with his medication changes.

## 2017-03-16 ENCOUNTER — Encounter: Payer: Self-pay | Admitting: *Deleted

## 2017-03-16 MED ORDER — LEVETIRACETAM 750 MG PO TABS: 750.0000 mg | ORAL_TABLET | Freq: Two times a day (BID) | ORAL | 0 refills | Status: DC

## 2017-03-16 NOTE — Telephone Encounter (Signed)
To test if  the new onset of drowsiness is from Depakote use, he can stop Depakote, restart previous Keppra 750 mg twice a day.  Next week, please check with patient if he went back on previous Keppra dosage 750 mg twice a day has made any difference in his mood drowsiness,   Also get medical record from [facility] emergency visit on March 13, 2017, including imaging study, and laboratory evaluations

## 2017-03-16 NOTE — Telephone Encounter (Signed)
Pts wife called wanting to know if it would be okay for pt to 250 at night as well, stating it makes him nervous and sleep wanting to sleep a lot

## 2017-03-16 NOTE — Addendum Note (Signed)
Addended by: Noberto Retort C on: 03/16/2017 12:12 PM   Modules accepted: Orders

## 2017-03-16 NOTE — Telephone Encounter (Addendum)
Spoke to patient's mother again to review Dr. Rhea Belton plan.  She verbalized understanding that she is to stop his Depakote and restart Keppra 750mg  BID.  I have also called the pharmacy to void his Depakote refills and a new 30-day prescription for Keppra has been sent to pharmacy (so his mother does not get confused).  She is aware we will call her back next week to check on him.  We can provide further refills once final decision is made on his medication regimen.  I have asked Milon Dikes to get his medical records from [place] for Dr. Krista Blue to review.

## 2017-03-16 NOTE — Telephone Encounter (Addendum)
Spoke to patient's mother, Pavel Gadd (on [place]) - states her son is still exhibiting excessive drowsiness and anxiety.  I verified his current seizure medications and doses.  He is no longer taking Keppra.  He is just taking Depakote ER 500mg , one tab in am and two tab in pm.

## 2017-03-17 NOTE — Telephone Encounter (Signed)
Request faxed over to [facility] records requesting notes.

## 2017-03-23 NOTE — Telephone Encounter (Signed)
I called pt's mother, per DPR. Pt's mother reports that pt is "back to his old self", not as drowsy, and his mood is stable. Pt is back to working with his crafts. Pt's mother thanked me for my call and knows to call us back with any further questions or concerns.

## 2017-03-29 ENCOUNTER — Telehealth: Payer: Self-pay | Admitting: Neurology

## 2017-03-29 NOTE — Telephone Encounter (Signed)
Reviewed emergency room record on March 13, 2017 from [facility]  He presented to the emergency room following a fall, increased confusion,  CT head without contrast, mild diffuse cortical atrophy, old right posterior parietal infarction, stable left ventriculostomy catheter with distal tip in the third ventricle, postsurgical change  Chest x-ray, no acute disease,  EKG, left ventricular hypertrophy, nonspecific T wave abnormality, prolonged QT,  Laboratory evaluations, hemoglobin of 14.4, WBC of 7.0, normal CMP, creatinine of 0.9  More pregastric 94.7, INR of 1.0 flu a and B were negative

## 2017-04-09 ENCOUNTER — Emergency Department (HOSPITAL_COMMUNITY): Payer: Medicare Other | Admitting: Anesthesiology

## 2017-04-09 ENCOUNTER — Emergency Department (HOSPITAL_COMMUNITY): Payer: Medicare Other

## 2017-04-09 ENCOUNTER — Encounter (HOSPITAL_COMMUNITY): Payer: Self-pay | Admitting: Emergency Medicine

## 2017-04-09 ENCOUNTER — Encounter (HOSPITAL_COMMUNITY)
Admission: EM | Disposition: A | Discharge status: Rehabilitation Facility | Payer: Self-pay | Source: Home / Self Care | Attending: Neurosurgery

## 2017-04-09 ENCOUNTER — Inpatient Hospital Stay (HOSPITAL_COMMUNITY)
Admission: EM | Admit: 2017-04-09 | Discharge: 2017-04-24 | DRG: 025 | Disposition: A | Discharge status: Rehabilitation Facility | Payer: Medicare Other | Attending: Neurosurgery | Admitting: Neurosurgery

## 2017-04-09 DIAGNOSIS — I6200 Nontraumatic subdural hemorrhage, unspecified: Secondary | ICD-10-CM | POA: Diagnosis not present

## 2017-04-09 DIAGNOSIS — Z0189 Encounter for other specified special examinations: Secondary | ICD-10-CM

## 2017-04-09 DIAGNOSIS — J386 Stenosis of larynx: Secondary | ICD-10-CM | POA: Diagnosis present

## 2017-04-09 DIAGNOSIS — K5900 Constipation, unspecified: Secondary | ICD-10-CM | POA: Diagnosis not present

## 2017-04-09 DIAGNOSIS — W19XXXA Unspecified fall, initial encounter: Secondary | ICD-10-CM | POA: Diagnosis present

## 2017-04-09 DIAGNOSIS — Z8661 Personal history of infections of the central nervous system: Secondary | ICD-10-CM

## 2017-04-09 DIAGNOSIS — J384 Edema of larynx: Secondary | ICD-10-CM | POA: Diagnosis present

## 2017-04-09 DIAGNOSIS — J9600 Acute respiratory failure, unspecified whether with hypoxia or hypercapnia: Secondary | ICD-10-CM | POA: Diagnosis not present

## 2017-04-09 DIAGNOSIS — Z8673 Personal history of transient ischemic attack (TIA), and cerebral infarction without residual deficits: Secondary | ICD-10-CM | POA: Diagnosis not present

## 2017-04-09 DIAGNOSIS — J9690 Respiratory failure, unspecified, unspecified whether with hypoxia or hypercapnia: Secondary | ICD-10-CM

## 2017-04-09 DIAGNOSIS — R509 Fever, unspecified: Secondary | ICD-10-CM | POA: Diagnosis not present

## 2017-04-09 DIAGNOSIS — R1314 Dysphagia, pharyngoesophageal phase: Secondary | ICD-10-CM | POA: Diagnosis not present

## 2017-04-09 DIAGNOSIS — E871 Hypo-osmolality and hyponatremia: Secondary | ICD-10-CM | POA: Diagnosis not present

## 2017-04-09 DIAGNOSIS — Z781 Physical restraint status: Secondary | ICD-10-CM | POA: Diagnosis not present

## 2017-04-09 DIAGNOSIS — Z85841 Personal history of malignant neoplasm of brain: Secondary | ICD-10-CM

## 2017-04-09 DIAGNOSIS — R451 Restlessness and agitation: Secondary | ICD-10-CM | POA: Diagnosis not present

## 2017-04-09 DIAGNOSIS — T380X5A Adverse effect of glucocorticoids and synthetic analogues, initial encounter: Secondary | ICD-10-CM | POA: Diagnosis not present

## 2017-04-09 DIAGNOSIS — D7582 Heparin induced thrombocytopenia (HIT): Secondary | ICD-10-CM | POA: Diagnosis present

## 2017-04-09 DIAGNOSIS — G40901 Epilepsy, unspecified, not intractable, with status epilepticus: Secondary | ICD-10-CM | POA: Diagnosis not present

## 2017-04-09 DIAGNOSIS — R259 Unspecified abnormal involuntary movements: Secondary | ICD-10-CM | POA: Diagnosis not present

## 2017-04-09 DIAGNOSIS — I6202 Nontraumatic subacute subdural hemorrhage: Secondary | ICD-10-CM | POA: Diagnosis not present

## 2017-04-09 DIAGNOSIS — D72829 Elevated white blood cell count, unspecified: Secondary | ICD-10-CM | POA: Diagnosis not present

## 2017-04-09 DIAGNOSIS — H9190 Unspecified hearing loss, unspecified ear: Secondary | ICD-10-CM | POA: Diagnosis present

## 2017-04-09 DIAGNOSIS — R569 Unspecified convulsions: Secondary | ICD-10-CM | POA: Diagnosis not present

## 2017-04-09 DIAGNOSIS — R4182 Altered mental status, unspecified: Secondary | ICD-10-CM | POA: Diagnosis not present

## 2017-04-09 DIAGNOSIS — Z4659 Encounter for fitting and adjustment of other gastrointestinal appliance and device: Secondary | ICD-10-CM

## 2017-04-09 DIAGNOSIS — T884XXA Failed or difficult intubation, initial encounter: Secondary | ICD-10-CM | POA: Diagnosis not present

## 2017-04-09 DIAGNOSIS — R061 Stridor: Secondary | ICD-10-CM | POA: Diagnosis not present

## 2017-04-09 DIAGNOSIS — S065X9A Traumatic subdural hemorrhage with loss of consciousness of unspecified duration, initial encounter: Secondary | ICD-10-CM | POA: Diagnosis not present

## 2017-04-09 DIAGNOSIS — G40101 Localization-related (focal) (partial) symptomatic epilepsy and epileptic syndromes with simple partial seizures, not intractable, with status epilepticus: Secondary | ICD-10-CM | POA: Diagnosis present

## 2017-04-09 DIAGNOSIS — S065X3S Traumatic subdural hemorrhage with loss of consciousness of 1 hour to 5 hours 59 minutes, sequela: Secondary | ICD-10-CM | POA: Diagnosis not present

## 2017-04-09 DIAGNOSIS — J9601 Acute respiratory failure with hypoxia: Secondary | ICD-10-CM | POA: Diagnosis present

## 2017-04-09 DIAGNOSIS — Z79899 Other long term (current) drug therapy: Secondary | ICD-10-CM | POA: Diagnosis not present

## 2017-04-09 DIAGNOSIS — A419 Sepsis, unspecified organism: Secondary | ICD-10-CM | POA: Diagnosis not present

## 2017-04-09 DIAGNOSIS — R4701 Aphasia: Secondary | ICD-10-CM | POA: Diagnosis not present

## 2017-04-09 DIAGNOSIS — S065X9D Traumatic subdural hemorrhage with loss of consciousness of unspecified duration, subsequent encounter: Secondary | ICD-10-CM | POA: Diagnosis not present

## 2017-04-09 DIAGNOSIS — Z978 Presence of other specified devices: Secondary | ICD-10-CM

## 2017-04-09 DIAGNOSIS — Z982 Presence of cerebrospinal fluid drainage device: Secondary | ICD-10-CM | POA: Diagnosis not present

## 2017-04-09 DIAGNOSIS — I6201 Nontraumatic acute subdural hemorrhage: Secondary | ICD-10-CM | POA: Diagnosis not present

## 2017-04-09 DIAGNOSIS — E7800 Pure hypercholesterolemia, unspecified: Secondary | ICD-10-CM | POA: Diagnosis present

## 2017-04-09 DIAGNOSIS — S065X0A Traumatic subdural hemorrhage without loss of consciousness, initial encounter: Secondary | ICD-10-CM | POA: Diagnosis not present

## 2017-04-09 DIAGNOSIS — S065X0D Traumatic subdural hemorrhage without loss of consciousness, subsequent encounter: Secondary | ICD-10-CM | POA: Diagnosis not present

## 2017-04-09 DIAGNOSIS — S065X2S Traumatic subdural hemorrhage with loss of consciousness of 31 minutes to 59 minutes, sequela: Secondary | ICD-10-CM | POA: Diagnosis not present

## 2017-04-09 DIAGNOSIS — S065X3A Traumatic subdural hemorrhage with loss of consciousness of 1 hour to 5 hours 59 minutes, initial encounter: Secondary | ICD-10-CM | POA: Diagnosis present

## 2017-04-09 DIAGNOSIS — I10 Essential (primary) hypertension: Secondary | ICD-10-CM | POA: Diagnosis not present

## 2017-04-09 DIAGNOSIS — R21 Rash and other nonspecific skin eruption: Secondary | ICD-10-CM | POA: Diagnosis not present

## 2017-04-09 DIAGNOSIS — Z4682 Encounter for fitting and adjustment of non-vascular catheter: Secondary | ICD-10-CM | POA: Diagnosis not present

## 2017-04-09 DIAGNOSIS — E039 Hypothyroidism, unspecified: Secondary | ICD-10-CM | POA: Diagnosis present

## 2017-04-09 DIAGNOSIS — S065XAA Traumatic subdural hemorrhage with loss of consciousness status unknown, initial encounter: Secondary | ICD-10-CM

## 2017-04-09 DIAGNOSIS — R1310 Dysphagia, unspecified: Secondary | ICD-10-CM | POA: Diagnosis not present

## 2017-04-09 DIAGNOSIS — R000 Tachycardia, unspecified: Secondary | ICD-10-CM | POA: Diagnosis not present

## 2017-04-09 DIAGNOSIS — R0989 Other specified symptoms and signs involving the circulatory and respiratory systems: Secondary | ICD-10-CM

## 2017-04-09 DIAGNOSIS — G40909 Epilepsy, unspecified, not intractable, without status epilepticus: Secondary | ICD-10-CM | POA: Diagnosis not present

## 2017-04-09 DIAGNOSIS — J9602 Acute respiratory failure with hypercapnia: Secondary | ICD-10-CM | POA: Diagnosis not present

## 2017-04-09 DIAGNOSIS — E785 Hyperlipidemia, unspecified: Secondary | ICD-10-CM | POA: Diagnosis not present

## 2017-04-09 DIAGNOSIS — C719 Malignant neoplasm of brain, unspecified: Secondary | ICD-10-CM | POA: Diagnosis not present

## 2017-04-09 DIAGNOSIS — S06303S Unspecified focal traumatic brain injury with loss of consciousness of 1 hour to 5 hours 59 minutes, sequela: Secondary | ICD-10-CM | POA: Diagnosis not present

## 2017-04-09 DIAGNOSIS — R0602 Shortness of breath: Secondary | ICD-10-CM | POA: Diagnosis not present

## 2017-04-09 DIAGNOSIS — R1312 Dysphagia, oropharyngeal phase: Secondary | ICD-10-CM | POA: Diagnosis not present

## 2017-04-09 DIAGNOSIS — R918 Other nonspecific abnormal finding of lung field: Secondary | ICD-10-CM | POA: Diagnosis not present

## 2017-04-09 DIAGNOSIS — R531 Weakness: Secondary | ICD-10-CM | POA: Diagnosis not present

## 2017-04-09 DIAGNOSIS — Z931 Gastrostomy status: Secondary | ICD-10-CM | POA: Diagnosis not present

## 2017-04-09 DIAGNOSIS — I952 Hypotension due to drugs: Secondary | ICD-10-CM | POA: Diagnosis not present

## 2017-04-09 DIAGNOSIS — I6789 Other cerebrovascular disease: Secondary | ICD-10-CM | POA: Diagnosis not present

## 2017-04-09 DIAGNOSIS — R0600 Dyspnea, unspecified: Secondary | ICD-10-CM

## 2017-04-09 DIAGNOSIS — T4275XA Adverse effect of unspecified antiepileptic and sedative-hypnotic drugs, initial encounter: Secondary | ICD-10-CM | POA: Diagnosis not present

## 2017-04-09 HISTORY — PX: CRANIOTOMY: SHX93

## 2017-04-09 HISTORY — PX: DIRECT LARYNGOSCOPY: SHX5326

## 2017-04-09 SURGERY — CRANIOTOMY HEMATOMA EVACUATION SUBDURAL
Anesthesia: General | Site: Mouth | Laterality: Right

## 2017-04-09 MED ORDER — FENTANYL CITRATE (PF) 100 MCG/2ML IJ SOLN: 25.0000 ug | INTRAMUSCULAR | Status: DC | PRN

## 2017-04-09 MED ORDER — BUPIVACAINE-EPINEPHRINE (PF) 0.5% -1:200000 IJ SOLN
INTRAMUSCULAR | Status: AC
  Filled 2017-04-09: qty 30

## 2017-04-09 MED ORDER — SUCCINYLCHOLINE CHLORIDE 20 MG/ML IJ SOLN
INTRAMUSCULAR | Status: DC | PRN
  Administered 2017-04-09: 50 mg via INTRAVENOUS
  Administered 2017-04-09: 100 mg via INTRAVENOUS
  Administered 2017-04-10: 50 mg via INTRAVENOUS

## 2017-04-09 MED ORDER — SUCCINYLCHOLINE CHLORIDE 200 MG/10ML IV SOSY
PREFILLED_SYRINGE | INTRAVENOUS | Status: AC
  Filled 2017-04-09: qty 10

## 2017-04-09 MED ORDER — PROPOFOL 10 MG/ML IV BOLUS
INTRAVENOUS | Status: DC | PRN
  Administered 2017-04-09 – 2017-04-10 (×2): 100 mg via INTRAVENOUS

## 2017-04-09 MED ORDER — THROMBIN 5000 UNITS EX SOLR
CUTANEOUS | Status: AC
  Filled 2017-04-09: qty 5000

## 2017-04-09 MED ORDER — FENTANYL CITRATE (PF) 250 MCG/5ML IJ SOLN
INTRAMUSCULAR | Status: AC
  Filled 2017-04-09: qty 5

## 2017-04-09 MED ORDER — COAGULATION FACTOR VIIA RECOMB 1 MG IV SOLR
90.0000 ug/kg | Freq: Once | INTRAVENOUS | Status: AC
  Administered 2017-04-09: 9000 ug via INTRAVENOUS
  Filled 2017-04-09: qty 5

## 2017-04-09 MED ORDER — DEXAMETHASONE SODIUM PHOSPHATE 10 MG/ML IJ SOLN
INTRAMUSCULAR | Status: DC | PRN
  Administered 2017-04-09: 10 mg via INTRAVENOUS

## 2017-04-09 MED ORDER — THROMBIN 20000 UNITS EX SOLR
CUTANEOUS | Status: AC
  Filled 2017-04-09: qty 20000

## 2017-04-09 SURGICAL SUPPLY — 83 items
BALLN PULM 15 16.5 18 X 75CM (BALLOONS) ×1
BALLN PULM 15 16.5 18X75 (BALLOONS) ×3
BALLOON PULM 15 16.5 18X75 (BALLOONS) IMPLANT
BASKET BONE COLLECTION (BASKET) IMPLANT
BIT DRILL WIRE PASS 1.3MM (BIT) IMPLANT
BNDG CMPR 75X41 PLY HI ABS (GAUZE/BANDAGES/DRESSINGS)
BNDG GAUZE ELAST 4 BULKY (GAUZE/BANDAGES/DRESSINGS) IMPLANT
BNDG STRETCH 4X75 STRL LF (GAUZE/BANDAGES/DRESSINGS) IMPLANT
BUR ACORN 6.0 PRECISION (BURR) ×3 IMPLANT
BUR ACORN 6.0MM PRECISION (BURR) ×1
BUR SPIRAL ROUTER 2.3 (BUR) IMPLANT
BUR SPIRAL ROUTER 2.3MM (BUR)
CANISTER SUCT 3000ML PPV (MISCELLANEOUS) ×4 IMPLANT
CARTRIDGE OIL MAESTRO DRILL (MISCELLANEOUS) ×2 IMPLANT
CATH ROBINSON RED A/P 12FR (CATHETERS) IMPLANT
CLIP VESOCCLUDE MED 6/CT (CLIP) IMPLANT
DECANTER SPIKE VIAL GLASS SM (MISCELLANEOUS) ×4 IMPLANT
DIFFUSER DRILL AIR PNEUMATIC (MISCELLANEOUS) ×4 IMPLANT
DRAIN JACKSON PRATT 10MM FLAT (MISCELLANEOUS) ×2 IMPLANT
DRAIN PENROSE 1/2X12 LTX STRL (WOUND CARE) IMPLANT
DRAPE NEUROLOGICAL W/INCISE (DRAPES) ×4 IMPLANT
DRAPE WARM FLUID 44X44 (DRAPE) ×4 IMPLANT
DRILL WIRE PASS 1.3MM (BIT)
DRSG OPSITE 4X5.5 SM (GAUZE/BANDAGES/DRESSINGS) ×6 IMPLANT
DRSG PAD ABDOMINAL 8X10 ST (GAUZE/BANDAGES/DRESSINGS) IMPLANT
DRSG TELFA 3X8 NADH (GAUZE/BANDAGES/DRESSINGS) ×4 IMPLANT
DURAMATRIX ONLAY 2X2 (Neuro Prosthesis/Implant) ×2 IMPLANT
DURAPREP 6ML APPLICATOR 50/CS (WOUND CARE) ×4 IMPLANT
ELECT REM PT RETURN 9FT ADLT (ELECTROSURGICAL) ×4
ELECTRODE REM PT RTRN 9FT ADLT (ELECTROSURGICAL) ×2 IMPLANT
EVACUATOR SILICONE 100CC (DRAIN) ×2 IMPLANT
GAUZE SPONGE 4X4 12PLY STRL (GAUZE/BANDAGES/DRESSINGS) IMPLANT
GAUZE SPONGE 4X4 16PLY XRAY LF (GAUZE/BANDAGES/DRESSINGS) IMPLANT
GLOVE BIO SURGEON STRL SZ8 (GLOVE) ×4 IMPLANT
GLOVE BIOGEL PI IND STRL 8 (GLOVE) ×2 IMPLANT
GLOVE BIOGEL PI IND STRL 8.5 (GLOVE) ×2 IMPLANT
GLOVE BIOGEL PI INDICATOR 8 (GLOVE) ×2
GLOVE BIOGEL PI INDICATOR 8.5 (GLOVE) ×2
GLOVE ECLIPSE 8.0 STRL XLNG CF (GLOVE) ×4 IMPLANT
GLOVE EXAM NITRILE LRG STRL (GLOVE) IMPLANT
GLOVE EXAM NITRILE XL STR (GLOVE) IMPLANT
GLOVE EXAM NITRILE XS STR PU (GLOVE) IMPLANT
GOWN STRL REUS W/ TWL LRG LVL3 (GOWN DISPOSABLE) IMPLANT
GOWN STRL REUS W/ TWL XL LVL3 (GOWN DISPOSABLE) IMPLANT
GOWN STRL REUS W/TWL 2XL LVL3 (GOWN DISPOSABLE) IMPLANT
GOWN STRL REUS W/TWL LRG LVL3 (GOWN DISPOSABLE)
GOWN STRL REUS W/TWL XL LVL3 (GOWN DISPOSABLE)
HEMOSTAT SURGICEL 2X14 (HEMOSTASIS) ×4 IMPLANT
KIT BASIN OR (CUSTOM PROCEDURE TRAY) ×4 IMPLANT
KIT ROOM TURNOVER OR (KITS) ×4 IMPLANT
MARKER SKIN DUAL TIP RULER LAB (MISCELLANEOUS) ×2 IMPLANT
NDL HYPO 25X1 1.5 SAFETY (NEEDLE) ×2 IMPLANT
NEEDLE HYPO 25X1 1.5 SAFETY (NEEDLE) ×4 IMPLANT
NS IRRIG 1000ML POUR BTL (IV SOLUTION) ×4 IMPLANT
OIL CARTRIDGE MAESTRO DRILL (MISCELLANEOUS) ×4
PACK CRANIOTOMY CUSTOM (CUSTOM PROCEDURE TRAY) ×4 IMPLANT
PAD ARMBOARD 7.5X6 YLW CONV (MISCELLANEOUS) ×4 IMPLANT
PAD DRESSING TELFA 3X8 NADH (GAUZE/BANDAGES/DRESSINGS) IMPLANT
PATTIES SURGICAL .5 X.5 (GAUZE/BANDAGES/DRESSINGS) IMPLANT
PATTIES SURGICAL .5 X3 (DISPOSABLE) IMPLANT
PATTIES SURGICAL 1X1 (DISPOSABLE) IMPLANT
PIN MAYFIELD SKULL DISP (PIN) IMPLANT
PLATE 1.5  2HOLE LNG NEURO (Plate) ×2 IMPLANT
PLATE 1.5  2HOLE MED NEURO (Plate) ×4 IMPLANT
PLATE 1.5 2HOLE LNG NEURO (Plate) IMPLANT
PLATE 1.5 2HOLE MED NEURO (Plate) IMPLANT
PLATE 1.5/0.5 13MM BURR HOLE (Plate) ×4 IMPLANT
SCREW SELF DRILL HT 1.5/4MM (Screw) ×26 IMPLANT
SPECIMEN JAR SMALL (MISCELLANEOUS) IMPLANT
SPONGE NEURO XRAY DETECT 1X3 (DISPOSABLE) IMPLANT
SPONGE SURGIFOAM ABS GEL 100 (HEMOSTASIS) IMPLANT
STAPLER SKIN PROX WIDE 3.9 (STAPLE) ×6 IMPLANT
SUT ETHILON 3 0 PS 1 (SUTURE) IMPLANT
SUT NURALON 4 0 TR CR/8 (SUTURE) ×8 IMPLANT
SUT VIC AB 2-0 CP2 18 (SUTURE) ×10 IMPLANT
SUT VIC AB 3-0 SH 8-18 (SUTURE) IMPLANT
SYR CONTROL 10ML LL (SYRINGE) IMPLANT
SYR GAUGE ALLIANCE SINGLE USE (MISCELLANEOUS) ×2 IMPLANT
TOWEL GREEN STERILE (TOWEL DISPOSABLE) ×4 IMPLANT
TOWEL GREEN STERILE FF (TOWEL DISPOSABLE) ×4 IMPLANT
TRAY FOLEY W/METER SILVER 16FR (SET/KITS/TRAYS/PACK) IMPLANT
UNDERPAD 30X30 (UNDERPADS AND DIAPERS) IMPLANT
WATER STERILE IRR 1000ML POUR (IV SOLUTION) ×4 IMPLANT

## 2017-04-09 NOTE — Anesthesia Preprocedure Evaluation (Signed)
Anesthesia Evaluation  Patient identified by MRN, date of birth, ID band Patient confused  Preop documentation limited or incomplete due to emergent nature of procedure.  Airway        Dental   Pulmonary neg pulmonary ROS,           Cardiovascular negative cardio ROS       Neuro/Psych History noted. CG    GI/Hepatic negative GI ROS, Neg liver ROS,   Endo/Other  negative endocrine ROS  Renal/GU negative Renal ROS     Musculoskeletal   Abdominal   Peds  Hematology   Anesthesia Other Findings   Reproductive/Obstetrics                             Anesthesia Physical Anesthesia Plan  ASA: III  Anesthesia Plan: General   Post-op Pain Management:    Induction: Intravenous  PONV Risk Score and Plan: 2 and Treatment may vary due to age or medical condition  Airway Management Planned: Oral ETT  Additional Equipment:   Intra-op Plan:   Post-operative Plan: Possible Post-op intubation/ventilation  Informed Consent: I have reviewed the patients History and Physical, chart, labs and discussed the procedure including the risks, benefits and alternatives for the proposed anesthesia with the patient or authorized representative who has indicated his/her understanding and acceptance.   Dental advisory given  Plan Discussed with: Anesthesiologist and CRNA  Anesthesia Plan Comments:         Anesthesia Quick Evaluation

## 2017-04-09 NOTE — ED Notes (Signed)
Dr. [name] advised RN to transport pt. to OR holding area .

## 2017-04-09 NOTE — ED Provider Notes (Addendum)
[facility] EMERGENCY DEPARTMENT Provider Note   CSN: [id number] Arrival date & time: 04/09/17  2155     History   Chief Complaint Chief Complaint  Patient presents with  . Subdural Hematoma    Transfer- [facility] ER    HPI Glen TOMPSON is a 47 y.o. male.  Patient is a 47 year old male with a history of glioblastoma status post surgical removal of the tumor and autoimmune cerebritis who presents as a transfer from [facility] emergency room.  He presented there with increased confusion over the last 2 days.  It is unclear what his baseline mental status is although he apparently is much more alert than he normally is.  There he initially had a low-grade fever and elevated white count and was initially treated with Rocephin for possible sepsis.  He then had a CT scan which showed a large subdural hematoma.  The EDP they are spoke with Dr. [name] with neurosurgery who accepted the patient to our facility for operative drainage of the subdural hematoma.  Patient received acetaminophen as well as Rocephin at the outlying facility.     Past Medical History:  Diagnosis Date  . Brain cancer ([place])   . Hypercholesteremia   . Seizure ([place])   . Stroke ([place])   . Thyroid disorder     Patient Active Problem List   Diagnosis Date Noted  . Seizures ([place]) 02/13/2017    Past Surgical History:  Procedure Laterality Date  . APPENDECTOMY     1980s  . [place] Medications    Prior to Admission medications   Medication Sig Start Date End Date Taking? Authorizing Provider  atorvastatin (LIPITOR) 20 MG tablet Take 20 mg by mouth daily. 02/06/17   [provider]  FLUoxetine (PROZAC) 20 MG capsule Take by mouth daily. 01/18/17   [provider]  fondaparinux (ARIXTRA) 2.5 MG/0.5ML SOLN injection INJECT 2.5 MG SUBCUTANEOUSLY DAILY 01/16/17   [provider]  levETIRAcetam (KEPPRA) 750 MG tablet Take 1 tablet  (750 mg total) by mouth every 12 (twelve) hours. 03/16/17   Marcial Pacas, MD  levothyroxine (SYNTHROID, LEVOTHROID) 50 MCG tablet TAKE 1 TABLET(S) BY MOUTH DAILY 11/22/16   [provider]  loratadine (CLARITIN) 10 MG tablet Take 10 mg by mouth daily. 12/02/16   [provider]  TESTOSTERONE IM Inject into the muscle. Inject every other week    [provider]    Family History Family History  Adopted: Yes    Social History Social History   Tobacco Use  . Smoking status: Never Smoker  . Smokeless tobacco: Never Used  Substance Use Topics  . Alcohol use: No    Frequency: Never  . Drug use: No     Allergies   Heparin; Lidocaine; and Phenytoin sodium extended   Review of Systems Review of Systems  Unable to perform ROS: Mental status change     Physical Exam Updated Vital Signs BP (!) 143/86 (BP Location: Right Arm)   Pulse (!) 101   Temp (!) 102.8 F (39.3 C) (Rectal)   Resp (!) 22   Ht 5\' 10"  (1.778 m)   Wt 99.8 kg (220 lb)   SpO2 96%   BMI 31.57 kg/m   Physical Exam  Constitutional: He appears well-developed and well-nourished.  HENT:  Head: Normocephalic.  Eyes: Pupils are equal, round, and reactive to light.  Cardiovascular: Regular rhythm and normal heart  sounds. Tachycardia present.  Pulmonary/Chest: Effort normal and breath sounds normal. No respiratory distress. He has no wheezes. He has no rales. He exhibits no tenderness.  Abdominal: Soft. Bowel sounds are normal. There is no tenderness. There is no rebound and no guarding.  Musculoskeletal: Normal range of motion. He exhibits no edema.  Lymphadenopathy:    He has no cervical adenopathy.  Neurological:  Patient is lying on the bed with eyes closed.  He will open his eyes to verbal stimuli.  I do not see any eye twitching or evidence of active seizures.  He is not verbally responsive.  He is not following commands.  Skin: Skin is warm and dry. No rash noted.  Psychiatric: He  has a normal mood and affect.     ED Treatments / Results  Labs (all labs ordered are listed, but only abnormal results are displayed) Labs Reviewed - No data to display  EKG None  Radiology No results found.  Procedures Procedures (including critical care time)  Medications Ordered in ED Medications - No data to display   Initial Impression / Assessment and Plan / ED Course  I have reviewed the triage vital signs and the nursing notes.  Pertinent labs & imaging results that were available during my care of the patient were reviewed by me and considered in my medical decision making (see chart for details).     Patient is here for a very short time in the emergency department.  Dr. [name] was notified and called for the patient to be transferred to the operating room.  He was noted to have a fever of 102.8 here with some mild tachycardia.  I did add a chest x-ray which was done just prior to transfer to the operating room.  I reviewed the patient's labs from the outside facility.  He had an elevated WBC count.  His lactate was normal.  His urinalysis does not appear to be infected.  I did note that the patient is on Arixtra.  I contacted the pharmacy who said that it would be appropriate to start [place] for reversal of this medication.  I spoke with Dr. [name] and notified him that patient was on this anticoagulant and the pharmacist will send up [place] to the operating room.  I also notified him that he had a temperature of 102.  CRITICAL CARE Performed by: Malvin Johns Total critical care time: 20 minutes Critical care time was exclusive of separately billable procedures and treating other patients. Critical care was necessary to treat or prevent imminent or life-threatening deterioration. Critical care was time spent personally by me on the following activities: development of treatment plan with patient and/or surrogate as well as nursing, discussions with consultants,  evaluation of patient's response to treatment, examination of patient, obtaining history from patient or surrogate, ordering and performing treatments and interventions, ordering and review of laboratory studies, ordering and review of radiographic studies, pulse oximetry and re-evaluation of patient's condition.   Final Clinical Impressions(s) / ED Diagnoses   Final diagnoses:  SDH (subdural hematoma) (HCC)  Fever, unspecified fever cause    ED Discharge Orders    None       Malvin Johns, MD 04/09/17 0623    Malvin Johns, MD 04/09/17 2237

## 2017-04-09 NOTE — Anesthesia Procedure Notes (Addendum)
Procedure Name: Intubation Date/Time: 04/09/2017 11:30 PM Performed by: Suzy Bouchard, CRNA Pre-anesthesia Checklist: Patient identified, Emergency Drugs available, Suction available, Patient being monitored and Timeout performed Patient Re-evaluated:Patient Re-evaluated prior to induction Oxygen Delivery Method: Circle system utilized Preoxygenation: Pre-oxygenation with 100% oxygen Induction Type: IV induction, Rapid sequence and Cricoid Pressure applied Ventilation: Mask ventilation without difficulty Laryngoscope Size: Miller, 3, Glidescope and 2 Grade View: Grade I Tube size: 5.5 (attempted 7.5, 7.0, 6.0, 5.5 without success) mm Number of attempts: 4 Airway Equipment and Method: Video-laryngoscopy Comments: Unable to intubate patient. Attempts #3 with Mil 2 (Gr 1 view) and subsequently smaller ETTs, able to pass through cords but meet resistance below glottis.  Attempt #4 with glidescope (Gr 1 view) and 5.5 ETT, still unable to pass ETT. 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 Dr. [name] with ENT consulted.  Patient awakened and ventilation supported with bag/mask.  VSS. No bleeding noted.

## 2017-04-09 NOTE — ED Triage Notes (Signed)
Patient transferred from [facility] ER by [facility] , Subdural hemorrhage - CT scan result , accepted by Dr. [name] , altered LOC for 2 days per family , received Rocephin 1 gram  IV at [facility] ER , febrile at arrival . Aphasia / lethargic at arrival .

## 2017-04-09 NOTE — H&P (Signed)
Reason for Consult:Subdural hematoma Referring Physician: [name] is an 47 y.o. male.  HPI: Patient is a 47 year old male with a history of glioblastoma status post surgical removal of the tumor (originally in 1993 by Dr. Sherwood [name]) and autoimmune cerebritis who presents as a transfer from [facility] emergency room.  He presented there with increased confusion over the last 2 days.  It is unclear what his baseline mental status is although he apparently is much more alert than he normally is.  Per discussion with patient's mother, he walks with a walker, speaks and interacts with family.  He was doing better until 2015, when he had a stroke.  There he initially had a low-grade fever and elevated white count and was initially treated with Rocephin for possible sepsis.  He then had a CT scan which showed a large subdural hematoma on the right.  The patient was transferred to our facility for operative drainage of the subdural hematoma.  Patient received acetaminophen as well as Rocephin at the outlying facility.  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  Patient apparently fell and hit his head 2 weeks ago and yesterday, put his head on his mother's shoulder while in church, complaining of a headache.  He is treated by Dr. Krista [name] for seizures and was on depakote, which was changed to Keppra (750 mg BID) due to hallucinations.    Past Medical History:  Diagnosis Date  . Brain cancer ([place])   . Hypercholesteremia   . Seizure ([place])   . Stroke ([place])   . Thyroid disorder     Past Surgical History:  Procedure Laterality Date  . APPENDECTOMY     1980s  . BRAIN SURGERY     1993    Family History  Adopted: Yes    Social History:  reports that he has never smoked. He has never used smokeless tobacco. He reports that he does not drink alcohol or use  drugs.  Allergies:  Allergies  Allergen Reactions  . Heparin Anaphylaxis    Patient has HITT  . Lidocaine Hives  . Phenytoin Sodium Extended Rash    Medications: I have reviewed the patient's current medications.  No results found for this or any previous visit (from the past 48 hour(s)).  Dg Chest Port 1 View  Result Date: 04/09/2017 CLINICAL DATA:  47 year old male with fever. EXAM: PORTABLE CHEST 1 VIEW COMPARISON:  Chest radiograph dated 04/09/2017 FINDINGS: There is shallow inspiration with minimal bibasilar atelectatic changes. No focal consolidation, pleural effusion, or pneumothorax. Stable cardiac silhouette. No acute osseous pathology. Partially visualized VP shunt over the left chest. IMPRESSION: No active disease. Electronically Signed   By: Glen [name] M.D.   On: 04/09/2017 22:48    Review of Systems - Negative except unable to perform due to mental status changes    Blood pressure (!) 143/86, pulse (!) 101, temperature (!) 102.8 F (39.3 C), temperature source Rectal, resp. rate (!) 22, height 5\' 10"  (1.778 m), weight 99.8 kg (220 lb), SpO2 96 %. Physical Exam  Constitutional: He appears well-developed and well-nourished.  HENT:  The patient has alopecia and multiple healed previous craniotomy incisions  Eyes: Pupils are equal, round, and reactive to light. EOM are normal.  Neck: Normal range of motion. Neck supple.  Cardiovascular: Normal rate.  Respiratory: Effort normal and breath sounds normal.  GI: Soft. Bowel sounds are normal.  Musculoskeletal: Normal range of motion.  Neurological: He is alert. He is disoriented. GCS eye subscore is 4. GCS verbal subscore is 2. GCS motor subscore is 5.  Patient withdraws all four extremities to pain, less forcefully in his left upper extremity    Assessment/Plan: Patient is being taken to OR on emergent basis for right fronto-temporal craniotomy for SDH.  He is on an anticoagulant, which is being emergently reversed  with Novo-7.  He has what appears to be a functioning left frontal VP shunt.  I told his mother that this may need to be tied off if his SDH reacumulates.  I also expressed concern to her that he is not doing very well and that if he does not recover significantly, we would need to discuss whether it is in his best interests to continue or to reoperate if his SDH reacumulates again.  He also has a febrile illness, the basis of which is currently not known.  I will ask CCM to participate in his care postoperatively.  Glen Shoals, MD 04/09/2017, 11:18 PM

## 2017-04-10 ENCOUNTER — Inpatient Hospital Stay (HOSPITAL_COMMUNITY): Payer: Medicare Other

## 2017-04-10 ENCOUNTER — Encounter (HOSPITAL_COMMUNITY): Payer: Self-pay | Admitting: Neurosurgery

## 2017-04-10 DIAGNOSIS — G40901 Epilepsy, unspecified, not intractable, with status epilepticus: Secondary | ICD-10-CM

## 2017-04-10 LAB — POCT I-STAT 7, (LYTES, BLD GAS, ICA,H+H)
Acid-base deficit: 3 mmol/L — ABNORMAL HIGH (ref 0.0–2.0)
Bicarbonate: 22.1 mmol/L (ref 20.0–28.0)
CALCIUM ION: 1.17 mmol/L (ref 1.15–1.40)
HEMATOCRIT: 40 % (ref 39.0–52.0)
HEMOGLOBIN: 13.6 g/dL (ref 13.0–17.0)
O2 Saturation: 99 %
PH ART: 7.384 (ref 7.350–7.450)
POTASSIUM: 4.1 mmol/L (ref 3.5–5.1)
SODIUM: 128 mmol/L — AB (ref 135–145)
TCO2: 23 mmol/L (ref 22–32)
pCO2 arterial: 37.1 mmHg (ref 32.0–48.0)
pO2, Arterial: 144 mmHg — ABNORMAL HIGH (ref 83.0–108.0)

## 2017-04-10 LAB — URINALYSIS, ROUTINE W REFLEX MICROSCOPIC
BILIRUBIN URINE: NEGATIVE
Glucose, UA: NEGATIVE mg/dL
Hgb urine dipstick: NEGATIVE
Ketones, ur: 80 mg/dL — AB
LEUKOCYTES UA: NEGATIVE
Nitrite: NEGATIVE
PH: 5 (ref 5.0–8.0)
Protein, ur: NEGATIVE mg/dL
Specific Gravity, Urine: 1.023 (ref 1.005–1.030)

## 2017-04-10 LAB — POCT I-STAT 3, ART BLOOD GAS (G3+)
ACID-BASE DEFICIT: 1 mmol/L (ref 0.0–2.0)
Bicarbonate: 22.9 mmol/L (ref 20.0–28.0)
O2 SAT: 100 %
PCO2 ART: 36.2 mmHg (ref 32.0–48.0)
PH ART: 7.409 (ref 7.350–7.450)
PO2 ART: 469 mmHg — AB (ref 83.0–108.0)
Patient temperature: 98.6
TCO2: 24 mmol/L (ref 22–32)

## 2017-04-10 LAB — CBC WITH DIFFERENTIAL/PLATELET
Basophils Absolute: 0 10*3/uL (ref 0.0–0.1)
Basophils Relative: 0 %
EOS ABS: 0 10*3/uL (ref 0.0–0.7)
EOS PCT: 0 %
HCT: 37 % — ABNORMAL LOW (ref 39.0–52.0)
Hemoglobin: 13.3 g/dL (ref 13.0–17.0)
LYMPHS ABS: 1.5 10*3/uL (ref 0.7–4.0)
LYMPHS PCT: 10 %
MCH: 30.2 pg (ref 26.0–34.0)
MCHC: 35.9 g/dL (ref 30.0–36.0)
MCV: 84.1 fL (ref 78.0–100.0)
MONO ABS: 0.5 10*3/uL (ref 0.1–1.0)
MONOS PCT: 3 %
Neutro Abs: 13 10*3/uL — ABNORMAL HIGH (ref 1.7–7.7)
Neutrophils Relative %: 87 %
PLATELETS: 296 10*3/uL (ref 150–400)
RBC: 4.4 MIL/uL (ref 4.22–5.81)
RDW: 13 % (ref 11.5–15.5)
WBC: 14.9 10*3/uL — AB (ref 4.0–10.5)

## 2017-04-10 LAB — LACTIC ACID, PLASMA: Lactic Acid, Venous: 1.4 mmol/L (ref 0.5–1.9)

## 2017-04-10 LAB — COMPREHENSIVE METABOLIC PANEL
ALT: 12 U/L — AB (ref 17–63)
AST: 58 U/L — ABNORMAL HIGH (ref 15–41)
Albumin: 3.1 g/dL — ABNORMAL LOW (ref 3.5–5.0)
Alkaline Phosphatase: 53 U/L (ref 38–126)
Anion gap: 10 (ref 5–15)
BUN: 13 mg/dL (ref 6–20)
CHLORIDE: 98 mmol/L — AB (ref 101–111)
CO2: 18 mmol/L — AB (ref 22–32)
CREATININE: 1.06 mg/dL (ref 0.61–1.24)
Calcium: 8.3 mg/dL — ABNORMAL LOW (ref 8.9–10.3)
Glucose, Bld: 147 mg/dL — ABNORMAL HIGH (ref 65–99)
Potassium: 3.8 mmol/L (ref 3.5–5.1)
SODIUM: 126 mmol/L — AB (ref 135–145)
Total Bilirubin: 1.5 mg/dL — ABNORMAL HIGH (ref 0.3–1.2)
Total Protein: 5.8 g/dL — ABNORMAL LOW (ref 6.5–8.1)

## 2017-04-10 LAB — TYPE AND SCREEN
ABO/RH(D): O POS
Antibody Screen: NEGATIVE

## 2017-04-10 LAB — GLUCOSE, CAPILLARY
GLUCOSE-CAPILLARY: 132 mg/dL — AB (ref 65–99)
GLUCOSE-CAPILLARY: 123 mg/dL — AB (ref 65–99)
GLUCOSE-CAPILLARY: 107 mg/dL — AB (ref 65–99)
Glucose-Capillary: 126 mg/dL — ABNORMAL HIGH (ref 65–99)
Glucose-Capillary: 120 mg/dL — ABNORMAL HIGH (ref 65–99)

## 2017-04-10 LAB — PROTIME-INR
INR: 0.74
PROTHROMBIN TIME: 10.3 s — AB (ref 11.4–15.2)

## 2017-04-10 LAB — MRSA PCR SCREENING: MRSA by PCR: POSITIVE — AB

## 2017-04-10 LAB — PROCALCITONIN: Procalcitonin: <0.1 ng/mL

## 2017-04-10 LAB — TRIGLYCERIDES: Triglycerides: 103 mg/dL (ref <150)

## 2017-04-10 LAB — MAGNESIUM: MAGNESIUM: 1.7 mg/dL (ref 1.7–2.4)

## 2017-04-10 LAB — CORTISOL: Cortisol, Plasma: 5.7 ug/dL

## 2017-04-10 MED ORDER — FLUOXETINE HCL 20 MG PO CAPS
20.0000 mg | ORAL_CAPSULE | Freq: Every day | ORAL | Status: DC
  Administered 2017-04-10 – 2017-04-24 (×13): 20 mg via ORAL
  Filled 2017-04-10 (×14): qty 1

## 2017-04-10 MED ORDER — MUPIROCIN 2 % EX OINT
1.0000 "application " | TOPICAL_OINTMENT | Freq: Two times a day (BID) | CUTANEOUS | Status: AC
  Administered 2017-04-10 – 2017-04-14 (×10): 1 via NASAL
  Filled 2017-04-10: qty 22

## 2017-04-10 MED ORDER — DOCUSATE SODIUM 50 MG/5ML PO LIQD
100.0000 mg | Freq: Two times a day (BID) | ORAL | Status: DC | PRN
  Administered 2017-04-13: 100 mg
  Filled 2017-04-10: qty 10

## 2017-04-10 MED ORDER — CEFAZOLIN SODIUM-DEXTROSE 2-3 GM-%(50ML) IV SOLR
INTRAVENOUS | Status: DC | PRN
  Administered 2017-04-10: 2 g via INTRAVENOUS

## 2017-04-10 MED ORDER — ONDANSETRON HCL 4 MG/2ML IJ SOLN: 4.0000 mg | INTRAMUSCULAR | Status: DC | PRN

## 2017-04-10 MED ORDER — MORPHINE SULFATE (PF) 4 MG/ML IV SOLN: 1.0000 mg | INTRAVENOUS | Status: DC | PRN

## 2017-04-10 MED ORDER — THROMBIN (RECOMBINANT) 5000 UNITS EX SOLR
OROMUCOSAL | Status: DC | PRN
  Administered 2017-04-10: 5 mL

## 2017-04-10 MED ORDER — FENTANYL CITRATE (PF) 250 MCG/5ML IJ SOLN
INTRAMUSCULAR | Status: DC | PRN
  Administered 2017-04-10: 50 ug via INTRAVENOUS
  Administered 2017-04-10: 100 ug via INTRAVENOUS

## 2017-04-10 MED ORDER — ROCURONIUM BROMIDE 100 MG/10ML IV SOLN
INTRAVENOUS | Status: DC | PRN
  Administered 2017-04-10: 50 mg via INTRAVENOUS

## 2017-04-10 MED ORDER — CHLORHEXIDINE GLUCONATE 0.12% ORAL RINSE (MEDLINE KIT)
15.0000 mL | Freq: Two times a day (BID) | OROMUCOSAL | Status: DC
  Administered 2017-04-10 – 2017-04-13 (×7): 15 mL via OROMUCOSAL

## 2017-04-10 MED ORDER — ORAL CARE MOUTH RINSE
15.0000 mL | Freq: Four times a day (QID) | OROMUCOSAL | Status: DC
  Administered 2017-04-10 – 2017-04-12 (×11): 15 mL via OROMUCOSAL

## 2017-04-10 MED ORDER — BACITRACIN ZINC 500 UNIT/GM EX OINT
TOPICAL_OINTMENT | CUTANEOUS | Status: DC | PRN
  Administered 2017-04-10: 1 via TOPICAL

## 2017-04-10 MED ORDER — LORATADINE 10 MG PO TABS: 10.0000 mg | ORAL_TABLET | Freq: Every day | ORAL | Status: DC

## 2017-04-10 MED ORDER — PROMETHAZINE HCL 25 MG PO TABS: 12.5000 mg | ORAL_TABLET | ORAL | Status: DC | PRN

## 2017-04-10 MED ORDER — LEVETIRACETAM 100 MG/ML PO SOLN
750.0000 mg | Freq: Two times a day (BID) | ORAL | Status: DC
  Administered 2017-04-10 (×2): 750 mg
  Filled 2017-04-10 (×2): qty 10

## 2017-04-10 MED ORDER — PROPOFOL 10 MG/ML IV BOLUS
INTRAVENOUS | Status: AC
  Filled 2017-04-10: qty 20

## 2017-04-10 MED ORDER — ATORVASTATIN CALCIUM 20 MG PO TABS
20.0000 mg | ORAL_TABLET | Freq: Every day | ORAL | Status: DC
  Administered 2017-04-10 – 2017-04-24 (×12): 20 mg via ORAL
  Filled 2017-04-10 (×13): qty 1

## 2017-04-10 MED ORDER — LACTATED RINGERS IV SOLN
INTRAVENOUS | Status: DC | PRN
  Administered 2017-04-09: 23:00:00 via INTRAVENOUS

## 2017-04-10 MED ORDER — PANTOPRAZOLE SODIUM 40 MG IV SOLR
40.0000 mg | Freq: Every day | INTRAVENOUS | Status: DC
  Administered 2017-04-10 – 2017-04-18 (×9): 40 mg via INTRAVENOUS
  Filled 2017-04-10 (×9): qty 40

## 2017-04-10 MED ORDER — ACETAMINOPHEN 325 MG PO TABS
650.0000 mg | ORAL_TABLET | ORAL | Status: DC | PRN
  Administered 2017-04-11: 650 mg via ORAL
  Filled 2017-04-10: qty 2

## 2017-04-10 MED ORDER — CEFAZOLIN SODIUM-DEXTROSE 1-4 GM/50ML-% IV SOLN
1.0000 g | Freq: Three times a day (TID) | INTRAVENOUS | Status: AC
  Administered 2017-04-10 (×2): 1 g via INTRAVENOUS
  Filled 2017-04-10 (×2): qty 50

## 2017-04-10 MED ORDER — FENTANYL CITRATE (PF) 100 MCG/2ML IJ SOLN
100.0000 ug | INTRAMUSCULAR | Status: DC | PRN
  Administered 2017-04-10 – 2017-04-19 (×14): 100 ug via INTRAVENOUS
  Filled 2017-04-10 (×15): qty 2

## 2017-04-10 MED ORDER — ACETAMINOPHEN 650 MG RE SUPP
650.0000 mg | RECTAL | Status: DC | PRN
  Administered 2017-04-10: 650 mg via RECTAL
  Filled 2017-04-10: qty 1

## 2017-04-10 MED ORDER — POTASSIUM CHLORIDE IN NACL 20-0.9 MEQ/L-% IV SOLN
INTRAVENOUS | Status: DC
  Administered 2017-04-10: 03:00:00 via INTRAVENOUS
  Filled 2017-04-10: qty 1000

## 2017-04-10 MED ORDER — LEVOTHYROXINE SODIUM 50 MCG PO TABS
50.0000 ug | ORAL_TABLET | Freq: Every day | ORAL | Status: DC
  Administered 2017-04-11 – 2017-04-24 (×12): 50 ug via ORAL
  Filled 2017-04-10 (×13): qty 1

## 2017-04-10 MED ORDER — LEVETIRACETAM IN NACL 1000 MG/100ML IV SOLN
1000.0000 mg | Freq: Once | INTRAVENOUS | Status: AC
  Administered 2017-04-10: 1000 mg via INTRAVENOUS
  Filled 2017-04-10: qty 100

## 2017-04-10 MED ORDER — LABETALOL HCL 5 MG/ML IV SOLN: 10.0000 mg | INTRAVENOUS | Status: DC | PRN

## 2017-04-10 MED ORDER — 0.9 % SODIUM CHLORIDE (POUR BTL) OPTIME
TOPICAL | Status: DC | PRN
  Administered 2017-04-10 (×4): 1000 mL

## 2017-04-10 MED ORDER — LEVETIRACETAM 750 MG PO TABS: 750.0000 mg | ORAL_TABLET | Freq: Two times a day (BID) | ORAL | Status: DC

## 2017-04-10 MED ORDER — BUPIVACAINE-EPINEPHRINE 0.5% -1:200000 IJ SOLN
INTRAMUSCULAR | Status: DC | PRN
  Administered 2017-04-10: 9 mL

## 2017-04-10 MED ORDER — ONDANSETRON HCL 4 MG PO TABS: 4.0000 mg | ORAL_TABLET | ORAL | Status: DC | PRN

## 2017-04-10 MED ORDER — PROPOFOL 500 MG/50ML IV EMUL
INTRAVENOUS | Status: DC | PRN
  Administered 2017-04-10: 75 ug/kg/min via INTRAVENOUS

## 2017-04-10 MED ORDER — CHLORHEXIDINE GLUCONATE CLOTH 2 % EX PADS
6.0000 | MEDICATED_PAD | Freq: Every day | CUTANEOUS | Status: AC
  Administered 2017-04-11 – 2017-04-14 (×3): 6 via TOPICAL

## 2017-04-10 MED ORDER — THROMBIN (RECOMBINANT) 20000 UNITS EX SOLR
CUTANEOUS | Status: DC | PRN
  Administered 2017-04-10: 20 mL

## 2017-04-10 MED ORDER — CHLORHEXIDINE GLUCONATE CLOTH 2 % EX PADS: 6.0000 | MEDICATED_PAD | Freq: Every day | CUTANEOUS | Status: DC

## 2017-04-10 MED ORDER — BACITRACIN ZINC 500 UNIT/GM EX OINT
TOPICAL_OINTMENT | CUTANEOUS | Status: AC
  Filled 2017-04-10: qty 28.35

## 2017-04-10 MED ORDER — BISACODYL 10 MG RE SUPP: 10.0000 mg | Freq: Every day | RECTAL | Status: DC | PRN

## 2017-04-10 MED ORDER — PROPOFOL 1000 MG/100ML IV EMUL
5.0000 ug/kg/min | INTRAVENOUS | Status: DC
  Administered 2017-04-10: 50 ug/kg/min via INTRAVENOUS
  Administered 2017-04-10: 75 ug/kg/min via INTRAVENOUS
  Administered 2017-04-11 (×2): 10 ug/kg/min via INTRAVENOUS
  Administered 2017-04-11 – 2017-04-12 (×2): 20 ug/kg/min via INTRAVENOUS
  Filled 2017-04-10 (×5): qty 100

## 2017-04-10 MED ORDER — ONDANSETRON HCL 4 MG/2ML IJ SOLN
INTRAMUSCULAR | Status: DC | PRN
  Administered 2017-04-10: 4 mg via INTRAVENOUS

## 2017-04-10 MED ORDER — SODIUM CHLORIDE 0.9 % IV SOLN
0.0000 ug/min | INTRAVENOUS | Status: DC
  Administered 2017-04-10: 50 ug/min via INTRAVENOUS
  Administered 2017-04-10: 60 ug/min via INTRAVENOUS
  Administered 2017-04-10: 25 ug/min via INTRAVENOUS
  Filled 2017-04-10: qty 10
  Filled 2017-04-10: qty 1

## 2017-04-10 MED FILL — Thrombin For Soln 20000 Unit: CUTANEOUS | Qty: 1 | Status: AC

## 2017-04-10 MED FILL — Thrombin For Soln 5000 Unit: CUTANEOUS | Qty: 5000 | Status: AC

## 2017-04-10 NOTE — Brief Op Note (Signed)
04/10/2017  1:51 AM  PATIENT:  Glen [name]  47 y.o. male  PRE-OPERATIVE DIAGNOSIS:  Acute Subdural Hematoma Right  POST-OPERATIVE DIAGNOSIS:  Acute Subdural Hematoma Right  PROCEDURE:  Procedure(s): CRANIOTOMY HEMATOMA EVACUATION SUBDURAL (Right) DIRECT LARYNGOSCOPY (N/A) DIRECT LARYNGOSCOPY  SURGEON:  Surgeon(s) and Role: Panel 1:    Erline Levine, MD - Primary    * [name], MD - Assisting Panel 2:    [name], MD - Primary  PHYSICIAN ASSISTANT:   ASSISTANTS: none   ANESTHESIA:   general  EBL:  100 mL   BLOOD ADMINISTERED:none  DRAINS: (10) Jackson-Pratt drain(s) with closed bulb suction in the subdural space   LOCAL MEDICATIONS USED:  MARCAINE     SPECIMEN:  No Specimen  DISPOSITION OF SPECIMEN:  N/A  COUNTS:  YES  TOURNIQUET:  * No tourniquets in log *  DICTATION: Patient is 47 year old man who presented with altered mental status and right SDH. He was on arixtra,and has had multiple cranial surgeries for tumor, a left frontal VP shunt, a prior stroke.   Head CT shows right SDH with mass effect and shift.It was elected to take patient to surgery for craniotomy for SDH.  Procedure:  Following smooth intubation with the assistance of Dr. [name] from ENT who performed direct laryngoscopy for subglottic stenosis, patient was placed in right semi-lateral position with blanket roll.  Head was placed on donut head holder and left frontal scalp was shaved and prepped and draped in usual sterile fashion.  Area of planned incision was infiltrated with marcaine. A previous curvilinear incision was reopened and carried through temporalis fascia and muscle to expose calvarium.  Skull flap was elevated exposing subdural hematoma.  Dura was opened and subdural was evacuated.  This was crank oil in color and under pressure with acute blood sequestered in three separate areas delineated by scar from prior brain surgery.   This was evacuated and a considerable amount  of blood was removed. The brain was irrigated.    Hemostasis was assured. The brain came back up after hematoma evacuation.  The dura was closed with 4-0 neurilon sutures after placing a #10 JP drain, bone flap was replaced with plates, the fascia and galea were closed with 2-0 vicryl sutures and the skin was re approximated with staples.  A sterile occlusive dressing was placed.  Patient was kept intubated and taken to Neuro ICU in stable condition having tolerated his operation well.   PLAN OF CARE: Admit to inpatient   PATIENT DISPOSITION:  PACU - hemodynamically stable.   Delay start of Pharmacological VTE agent (>24hrs) due to surgical blood loss or risk of bleeding: yes

## 2017-04-10 NOTE — Progress Notes (Signed)
Subjective: Patient reports on ventilator with propofol sedation  Objective: Vital signs in last 24 hours: Temp:  [99.7 F (37.6 C)-102.8 F (39.3 C)] 100.6 F (38.1 C) (03/25 0700) Pulse Rate:  [74-101] 82 (03/25 0500) Resp:  [16-22] 16 (03/25 0700) BP: (84-143)/(51-86) 116/71 (03/25 0700) SpO2:  [96 %-100 %] 100 % (03/25 6295) Arterial Line BP: (94-162)/(53-84) 127/65 (03/25 0700) FiO2 (%):  [40 %-100 %] 40 % (03/25 0608) Weight:  [99.8 kg (220 lb)] 99.8 kg (220 lb) (03/24 2205)  Intake/Output from previous day: 03/24 0701 - 03/25 0700 In: 900 [I.V.:900] Out: 370 [Urine:200; Drains:70; Blood:100] Intake/Output this shift: No intake/output data recorded.  Physical Exam: More alert.  Still not good spontaneous movement of left upper extremity.  He moves all other extremities well and spontaneously and has been following commands with both lower extremities.  Dressing CDI.  Lab Results: Recent Labs    04/10/17 0549  WBC 14.9*  HGB 13.3  HCT 37.0*  PLT 296   BMET Recent Labs    04/10/17 0549  NA 126*  K 3.8  CL 98*  CO2 18*  GLUCOSE 147*  BUN 13  CREATININE 1.06  CALCIUM 8.3*    Studies/Results: Portable Chest X-ray  Result Date: 04/10/2017 CLINICAL DATA:  Endotracheal tube placement EXAM: PORTABLE CHEST 1 VIEW COMPARISON:  Chest radiograph 04/09/2017 FINDINGS: Endotracheal tube tip is at the level of the clavicular heads, in satisfactory position. The lungs are clear. No pleural effusion. IMPRESSION: Radiographically appropriate position of endotracheal tube. Electronically Signed   By: Ulyses Jarred M.D.   On: 04/10/2017 04:35   Dg Chest Port 1 View  Result Date: 04/09/2017 CLINICAL DATA:  47 year old male with fever. EXAM: PORTABLE CHEST 1 VIEW COMPARISON:  Chest radiograph dated 04/09/2017 FINDINGS: There is shallow inspiration with minimal bibasilar atelectatic changes. No focal consolidation, pleural effusion, or pneumothorax. Stable cardiac silhouette. No  acute osseous pathology. Partially visualized VP shunt over the left chest. IMPRESSION: No active disease. Electronically Signed   By: Anner Crete M.D.   On: 04/09/2017 22:48    Assessment/Plan: I appreciate CCM assistance.  Will wean vent to extubate per CCM.  JP has put out 70 cc since surgery.  Will continue today.  Head CT later this morning to assess evacuation of SDH.  Na 126; CCM to address.    LOS: 1 day    [name], MD 04/10/2017, 7:32 AM

## 2017-04-10 NOTE — Op Note (Signed)
04/10/2017  1:51 AM  PATIENT:  Glen [name]  47 y.o. male  PRE-OPERATIVE DIAGNOSIS:  Acute Subdural Hematoma Right  POST-OPERATIVE DIAGNOSIS:  Acute Subdural Hematoma Right  PROCEDURE:  Procedure(s): CRANIOTOMY HEMATOMA EVACUATION SUBDURAL (Right) DIRECT LARYNGOSCOPY (N/A) DIRECT LARYNGOSCOPY  SURGEON:  Surgeon(s) and Role: Panel 1:    Erline Levine, MD - Primary    * [name], MD - Assisting Panel 2:    [name], MD - Primary  PHYSICIAN ASSISTANT:   ASSISTANTS: none   ANESTHESIA:   general  EBL:  100 mL   BLOOD ADMINISTERED:none  DRAINS: (10) Jackson-Pratt drain(s) with closed bulb suction in the subdural space   LOCAL MEDICATIONS USED:  MARCAINE     SPECIMEN:  No Specimen  DISPOSITION OF SPECIMEN:  N/A  COUNTS:  YES  TOURNIQUET:  * No tourniquets in log *  DICTATION: Patient is 47 year old man who presented with altered mental status and right SDH. He was on arixtra,and has had multiple cranial surgeries for tumor, a left frontal VP shunt, a prior stroke.   Head CT shows right SDH with mass effect and shift.It was elected to take patient to surgery for craniotomy for SDH.  Procedure:  Following smooth intubation with the assistance of Dr. [name] from ENT who performed direct laryngoscopy for subglottic stenosis, patient was placed in right semi-lateral position with blanket roll.  Head was placed on donut head holder and left frontal scalp was shaved and prepped and draped in usual sterile fashion.  Area of planned incision was infiltrated with marcaine. A previous curvilinear incision was reopened and carried through temporalis fascia and muscle to expose calvarium.  Skull flap was elevated exposing subdural hematoma.  Dura was opened and subdural was evacuated.  This was crank oil in color and under pressure with acute blood sequestered in three separate areas delineated by scar from prior brain surgery.   This was evacuated and a considerable amount  of blood was removed. The brain was irrigated.    Hemostasis was assured. The brain came back up after hematoma evacuation.  The dura was closed with 4-0 neurilon sutures after placing a #10 JP drain, bone flap was replaced with plates, the fascia and galea were closed with 2-0 vicryl sutures and the skin was re approximated with staples.  A sterile occlusive dressing was placed.  Patient was kept intubated and taken to Neuro ICU in stable condition having tolerated his operation well.   PLAN OF CARE: Admit to inpatient   PATIENT DISPOSITION:  PACU - hemodynamically stable.   Delay start of Pharmacological VTE agent (>24hrs) due to surgical blood loss or risk of bleeding: yes

## 2017-04-10 NOTE — Progress Notes (Signed)
[facility] Progress Note Patient Name: Glen [name] DOB: [birth year] MRN: [id number]   Date of Service  04/10/2017  HPI/Events of Note  47 yo male with PMH of brain tumor resection 1993 and CVA in 2015 - s/p VP shunt. Presents with ALOC. Found to have SDH. Now s/p evacuation of SDH. Reported difficult airway in OR. Plan is to leave on ventilator overnight and extubated in supervised environment tomorrow. PCCM asked to consult to manage ventilator and medical issues.   eICU Interventions  No new orders.      Intervention Category Evaluation Type: New Patient Evaluation  Lysle Dingwall 04/10/2017, 2:38 AM

## 2017-04-10 NOTE — Progress Notes (Signed)
Transported pt to and from 4N26 to CT2 on vent. Pt stable throughout with no complications. VS within normal limits.

## 2017-04-10 NOTE — Procedures (Signed)
Preop diagnosis: Inspiratory stridor, difficult intubation Postop diagnosis: same Procedure: Direct laryngoscopy with intubation Surgeon: [name] Anesth: General Comp: None Findings: Upon laryngoscopy, the vocal folds appeared normal in structure.  A rigid telescope was passed down the trachea where there was no significant obstruction seen.  There was a small nodule of granulation at the previous tracheostomy site internally.  The trachea curved a bit to the right below the glottis. Description: After bringing necessary equipment to the room, the bed was turned 90 degrees from anesthesia under mask ventilation and general anesthesia was induced.  His eyes were taped closed and a damp gauze was placed over the upper gum.  A Storz laryngoscope was inserted to the glottic position and a zero degree telescope was passed down the trachea to the mid trachea.  Findings are noted above.  The telescope was removed.  A 6.0 endotracheal tube was easily passed through the laryngoscope and held in place while the laryngoscope was removed.  The patient was successfully intubated.  The anesthesia team requested placement, then, of a large tube so the laryngoscope was reinserted and the 6.0 tube was removed.  A 7.0 subglottic endotracheal tube was then placed through the laryngoscope easily and the laryngoscope was then backed out.  The patient was successfully ventilated.  The tube was taped into place and the case was allowed to proceed as planned.

## 2017-04-10 NOTE — Anesthesia Procedure Notes (Signed)
Arterial Line Insertion Start/End3/25/2019 12:45 AM, 04/10/2017 1:00 AM Performed by: CRNA  Patient location: OR. Left, radial was placed Catheter size: 20 G Hand hygiene performed , maximum sterile barriers used  and Seldinger technique used  Attempts: 3 Procedure performed without using ultrasound guided technique. Following insertion, dressing applied and Biopatch.

## 2017-04-10 NOTE — Consult Note (Signed)
PULMONARY / CRITICAL CARE MEDICINE   Name: Glen [name] MRN: [id number] DOB: 1970/07/23    ADMISSION DATE:  04/09/2017 CONSULTATION DATE:  04/10/2017  REFERRING MD:  Dr. [name]  CHIEF COMPLAINT:  Vent management  HISTORY OF PRESENT ILLNESS:   HPI obtained from medical chart review as patient is intubated and sedated on mechanical ventilation.  Unable to locate transfer records for labs/ detailed imaging at [facility] at the time of this HPI.   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  Apparently fell 2 weeks ago hitting his head.  Complained of a headache to his mother on the morning of 3/24 while at church.  Additionally, he was recently changed from depakote to [place] due to hallucinations by Dr. Krista Blue who treats him for seizures.   There was concern for sepsis with initial presentation of low-grade fever and elevated WBC at [facility] and was treated with rocephin.  CT head showed a large subdural hematoma on the right.  He was transferred to [facility] ED, lethargic with temperature of 102.8, unknown baseline mental status, admitted to Neurosurgery and taken to OR for craniotomy and hematoma evacuation.  He was given Novoseven in OR as patient had been taking arixta.  ENT consulted in OR due to anesthesia team having difficulty passing ETT past glottis with inspiratory stridor, however remained easy to ventilate with bag/mask ventilation where a 7.0 ETT was placed using a Storz laryngoscope. Patient returns to ICU post-operatively and PCCM consulted for ventilator management and medical  management.   PAST MEDICAL HISTORY :  He  has a past medical history of Brain cancer ([place]), Hypercholesteremia, Seizure ([place]), Stroke ([place]), and Thyroid disorder.  PAST SURGICAL HISTORY: He  has a past surgical history that includes Brain surgery and Appendectomy.  Allergies  Allergen Reactions  . Heparin Anaphylaxis    Patient has HITT  . Lidocaine Hives  . Phenytoin Sodium Extended Rash    No current facility-administered medications on file prior to encounter.    Current Outpatient Medications on File Prior to Encounter  Medication Sig  . atorvastatin (LIPITOR) 20 MG tablet Take 20 mg by mouth daily.  Marland Kitchen FLUoxetine (PROZAC) 20 MG capsule Take by mouth daily.  . fondaparinux (ARIXTRA) 2.5 MG/0.5ML SOLN injection INJECT 2.5 MG SUBCUTANEOUSLY DAILY  . levETIRAcetam (KEPPRA) 750 MG tablet Take 1 tablet (750 mg total) by mouth every 12 (twelve) hours.  Marland Kitchen levothyroxine (SYNTHROID, LEVOTHROID) 50 MCG tablet TAKE 1 TABLET(S) BY MOUTH DAILY  . loratadine (CLARITIN) 10 MG tablet Take 10 mg by mouth daily.  . TESTOSTERONE IM Inject into the muscle. Inject every other week    FAMILY HISTORY:  His is adopted.   SOCIAL HISTORY: He  reports that he has never smoked. He has never used smokeless tobacco. He reports that he does not drink alcohol or use drugs.  REVIEW OF SYSTEMS:   Unable to assess as patient is intubated and sedated.   SUBJECTIVE:  Currently on propofol at 30 mcg/kg/min and neosynephrine at 80 mcg/min  VITAL SIGNS: BP (!) 143/86 (BP Location: Right Arm)   Pulse (!) 101   Temp (!) 102.8 F (39.3 C) (Rectal)   Resp (!) 22   Ht 5\' 10"  (1.778 m)  Wt 220 lb (99.8 kg)   SpO2 100%   BMI 31.57 kg/m   HEMODYNAMICS:    VENTILATOR SETTINGS: Vent Mode: PRVC FiO2 (%):  [100 %] 100 % Set Rate:  [16 bmp] 16 bmp Vt Set:  [580 mL] 580 mL PEEP:  [5 cmH20] 5 cmH20 Plateau Pressure:  [16 cmH20] 16 cmH20  INTAKE / OUTPUT: No intake/output data recorded.  PHYSICAL  EXAMINATION: General:  Critically ill adult male sedated on MV  HEENT: MM pink/moist, dressing to right frontal/temporal intact with JP drain with sanguinous drainage, pupils 3/ reactive, ETT Neuro:  Sedated, withdrawals to pain in all extremities less so in LUE CV:  rrr SR 77, 2+ pulses PULM: even/non-labored on MV, lungs bilaterally coarse throughout GI: soft, bs active  Extremities: warm/dry, no edema  Skin: no rashes   LABS:  BMET No results for input(s): NA, K, CL, CO2, BUN, CREATININE, GLUCOSE in the last 168 hours.  Electrolytes No results for input(s): CALCIUM, MG, PHOS in the last 168 hours.  CBC No results for input(s): WBC, HGB, HCT, PLT in the last 168 hours.  Coag's No results for input(s): APTT, INR in the last 168 hours.  Sepsis Markers No results for input(s): LATICACIDVEN, PROCALCITON, O2SATVEN in the last 168 hours.  ABG No results for input(s): PHART, PCO2ART, PO2ART in the last 168 hours.  Liver Enzymes No results for input(s): AST, ALT, ALKPHOS, BILITOT, ALBUMIN in the last 168 hours.  Cardiac Enzymes No results for input(s): TROPONINI, PROBNP in the last 168 hours.  Glucose No results for input(s): GLUCAP in the last 168 hours.  Imaging Dg Chest Port 1 View  Result Date: 04/09/2017 CLINICAL DATA:  47 year old male with fever. EXAM: PORTABLE CHEST 1 VIEW COMPARISON:  Chest radiograph dated 04/09/2017 FINDINGS: There is shallow inspiration with minimal bibasilar atelectatic changes. No focal consolidation, pleural effusion, or pneumothorax. Stable cardiac silhouette. No acute osseous pathology. Partially visualized VP shunt over the left chest. IMPRESSION: No active disease. Electronically Signed   By: Anner Crete M.D.   On: 04/09/2017 22:48   STUDIES:  CTH at [place]  CXR 3/24 >> no acute disease  CULTURES: 3/25 BCx 2 >>  ANTIBIOTICS: 3/24 Rocephin at [place]: Admitted/ OR 3/24  LINES/TUBES: PIV x 2 ETT 3/25  >> Foley 3/25 >>  DISCUSSION: 54 yoM with prior glioblastoma s/p removal, VP shunt, autoimmune cerebritis, HIT on Arixta, presenting with 2 days of confusion and headache after fall 2 weeks ago. Also presenting with fever and elevated WBC of unclear etiology.  Found to have large R SDH taken to OR for crani and evacuation with residual JP drain.  Reversed with Novoseven.  Difficult airway in OR requiring emergent consult per ENT, 7.0 ETT placed.  Returns to ICU on MV.   ASSESSMENT / PLAN:  PULMONARY A: Difficult airway- inspiratory stridor/ unable to pass glottis-  intubated per ENT in OR  Acute respiratory insufficiency in the post-operative setting Hx of trach P:   ENT following-   Continue full MV support, PRVC 8 cc/kg ABG and CXR now VAP protocol Restraints/ mittens as needed to prevent self-extubation   CARDIOVASCULAR A:  Hypotension, likely related to sedation, r/o other etiology such as septic/ hemorrhagic shock Hx HTN, HLD P:  Tele monitoring Neo for goal MAP > 65; SBP < 160 Labetalol prn  Trend lactate Assess cortisol   RENAL A:   Unknown-  P:   NS w/ 20 meq at 75 ml/hr Renal panel  now Trend BMP / urinary output/ daily weights  Replace electrolytes as indicated Avoid nephrotoxic agents, ensure adequate renal perfusion  GASTROINTESTINAL A:   No known acute issues P:   NPO Place OGT Assess LFTs PPI for SUP Bowel regimen per PAD protocol   HEMATOLOGIC A:   Unknown labs Hx HIT- on Arixta preadmit - reversed with Novoseven in OR  P:  CBC now Coags now Type and screen now SCDs only   INFECTIOUS A:   Febrile and leukocytosis -CXR clear  P:   BC now Send for UA Assess PCT Trend fever curve and WBC Tylenol and cooling blanket prn  Consider LP to r/o cerebritis   ENDOCRINE A:   Hypothyroidism    P:   CBG q 4 while NPO Continue synthroid per tube, if unable to place OGT, will need to switch to IV  NEUROLOGIC A:   Acute right SDH s/p  fall while on Arixta s/p crani/ evaluation 3/24 Hx of seizures, VP shunt, glioblastoma, and autoimmune cerebritis   P:   RASS goal: -1/-2 PAD protocol with propofol and fentanyl prn  Management and further imaging per NSGY Repeat CTH at 11 am Continue keppra BID, prozac Seizure precautions  Hourly neuro exams    FAMILY  - Updates:  No family at bedside.   - Inter-disciplinary family meet or Palliative Care meeting due by:  3/31  CCT 60 mins  Kennieth Rad, AGACNP-BC [facility] Pgr: [phone] or if no answer [phone] 04/10/2017, 5:24 AM

## 2017-04-10 NOTE — Progress Notes (Signed)
Received patient for OR, BMV by CRNA. Placed on ventilator, full support. 7.0 ETT secured with ETAB holder at 24 cm (lip)

## 2017-04-10 NOTE — Consult Note (Signed)
Reason for Consult: Airway management Referring Physician: Anesthesiology, Neurosurgery  Glen [name] is an 47 y.o. male.  HPI: 47 year old male with past history of brain tumor removed in 1993 presented with 2 days of confusion and was initially treated for sepsis.  A head CT then showed a large subdural hematoma and he was transferred from [facility] for neurosurgical management.  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  He was brought to the operating room by Dr. [name] for surgical evacuation and the anesthesia team had difficulty passing any endotracheal tube past the glottis.  He remained easy to mask-ventilate so intraoperative consultation was requested.  He had a tracheostomy previously.  Past Medical History:  Diagnosis Date  . Brain cancer ([place])   . Hypercholesteremia   . Seizure ([place])   . Stroke ([place])   . Thyroid disorder     Past Surgical History:  Procedure Laterality Date  . APPENDECTOMY     1980s  . BRAIN SURGERY     1993    Family History  Adopted: Yes    Social History:  reports that he has never smoked. He has never used smokeless tobacco. He reports that he does not drink alcohol or use drugs.  Allergies:  Allergies  Allergen Reactions  . Heparin Anaphylaxis    Patient has HITT  . Lidocaine Hives  . Phenytoin Sodium Extended Rash    Medications: I have reviewed the patient's current medications.  No results found for this or any previous visit (from the past 48 hour(s)).  Dg Chest Port 1 View  Result Date: 04/09/2017 CLINICAL DATA:  47 year old male with fever. EXAM: PORTABLE CHEST 1 VIEW COMPARISON:  Chest radiograph dated 04/09/2017 FINDINGS: There is shallow inspiration with minimal bibasilar atelectatic changes. No focal consolidation, pleural effusion, or pneumothorax. Stable cardiac silhouette. No acute osseous pathology. Partially visualized VP shunt over the left chest. IMPRESSION: No active disease. Electronically Signed   By: Anner Crete M.D.   On: 04/09/2017 22:48    Review of Systems  Unable to perform ROS: Medical condition   Blood pressure (!) 143/86, pulse (!) 101, temperature (!) 102.8 F (39.3 C), temperature source Rectal, resp. rate (!) 22, height 5\' 10"  (1.778 m), weight 220 lb (99.8 kg), SpO2 96 %. Physical Exam  Constitutional: He appears well-developed and well-nourished.  Sedated.  HENT:  Right Ear: External ear normal.  Left Ear: External ear normal.  Nose: Nose normal.  Mouth/Throat: Oropharynx is clear and moist.  Multiple healed craniotomy scars.  Eyes:  Eyes closed.  Neck:  Tracheostomy scar.  Cardiovascular: Normal rate.  Respiratory:  Being mask-ventilated but soft inspiratory stridor present.  Neurological:  Sedated.  Skin: Skin is warm and dry.  Psychiatric:  Sedated.    Assessment/Plan: Inspiratory stridor, difficult intubation  His situation was discussed thoroughly with the anesthesia team.  Being that he was easy to mask, I recommended inducing anesthesia again for direct laryngoscopy to evaluate the airway and consider tracheostomy if unable to intubate.  No consent was obtained due to the urgent nature of the evaluation.  Margee Trentham 04/10/2017, 12:33 AM

## 2017-04-10 NOTE — Progress Notes (Signed)
[facility] Progress Note Patient Name: Glen [name] DOB: [birth year] MRN: [id number]   Date of Service  04/10/2017  HPI/Events of Note  Multiple issues: 1. Hypotension - Currently on Phenylephrine IV infusion. No orders for same. 2. Agitation - Currently on a Propofol IV infusion. No orders for same.   eICU Interventions  Will order: 1. Phenylephrine IV infusion. Titrate to MAP >  65. 2. Propofol IV infusion. Titrate to RASS = 0 to -1.      Intervention Category Major Interventions: Hypotension - evaluation and management Minor Interventions: Agitation / anxiety - evaluation and management Evaluation Type: New Patient Evaluation  Donavan Kerlin Eugene 04/10/2017, 2:42 AM

## 2017-04-10 NOTE — Anesthesia Postprocedure Evaluation (Signed)
Anesthesia Post Note  Patient: Glen [name]  Procedure(s) Performed: CRANIOTOMY HEMATOMA EVACUATION SUBDURAL (Right Head) DIRECT LARYNGOSCOPY (N/A Mouth) DIRECT LARYNGOSCOPY (Mouth)     Patient location during evaluation: ICU Anesthesia Type: General Level of consciousness: patient remains intubated per anesthesia plan Pain management: pain level controlled Vital Signs Assessment: post-procedure vital signs reviewed and stable Respiratory status: patient remains intubated per anesthesia plan Cardiovascular status: stable Anesthetic complications: no    Last Vitals:  Vitals:   04/09/17 2204  BP: (!) 143/86  Pulse: (!) 101  Resp: (!) 22  Temp: (!) 39.3 C  SpO2: 96%    Last Pain:  Vitals:   04/09/17 2210  TempSrc:   PainSc: Asleep                 Fryda Molenda

## 2017-04-10 NOTE — Transfer of Care (Signed)
Immediate Anesthesia Transfer of Care Note  Patient: Glen [name]  Procedure(s) Performed: CRANIOTOMY HEMATOMA EVACUATION SUBDURAL (Right Head) DIRECT LARYNGOSCOPY (N/A Mouth) DIRECT LARYNGOSCOPY (N/A Mouth)  Patient Location: ICU  Anesthesia Type:General  Level of Consciousness: sedated and Patient remains intubated per anesthesia plan  Airway & Oxygen Therapy: Patient remains intubated per anesthesia plan and Patient placed on Ventilator (see vital sign flow sheet for setting)  Post-op Assessment: Report given to RN and Post -op Vital signs reviewed and stable  Post vital signs: Reviewed and stable  Last Vitals:  Vitals Value Taken Time  BP    Temp 37.8 C 04/10/2017  2:16 AM  Pulse 79 04/10/2017  2:12 AM  Resp 18 04/10/2017  2:16 AM  SpO2 100 % 04/10/2017  2:12 AM  Vitals shown include unvalidated device data.  Last Pain:  Vitals:   04/10/17 0200  TempSrc: Core  PainSc:          Complications: No apparent anesthesia complications

## 2017-04-11 ENCOUNTER — Inpatient Hospital Stay (HOSPITAL_COMMUNITY): Payer: Medicare Other

## 2017-04-11 LAB — CBC WITH DIFFERENTIAL/PLATELET
Basophils Absolute: 0 10*3/uL (ref 0.0–0.1)
Basophils Relative: 0 %
Eosinophils Absolute: 0 10*3/uL (ref 0.0–0.7)
Eosinophils Relative: 0 %
HEMATOCRIT: 32.1 % — AB (ref 39.0–52.0)
Hemoglobin: 10.8 g/dL — ABNORMAL LOW (ref 13.0–17.0)
LYMPHS ABS: 1.9 10*3/uL (ref 0.7–4.0)
LYMPHS PCT: 16 %
MCH: 29 pg (ref 26.0–34.0)
MCHC: 33.6 g/dL (ref 30.0–36.0)
MCV: 86.1 fL (ref 78.0–100.0)
Monocytes Absolute: 0.9 10*3/uL (ref 0.1–1.0)
Monocytes Relative: 8 %
NEUTROS ABS: 8.7 10*3/uL — AB (ref 1.7–7.7)
NEUTROS PCT: 76 %
Platelets: 187 10*3/uL (ref 150–400)
RBC: 3.73 MIL/uL — AB (ref 4.22–5.81)
RDW: 13.9 % (ref 11.5–15.5)
WBC: 11.4 10*3/uL — AB (ref 4.0–10.5)

## 2017-04-11 LAB — POCT I-STAT 3, ART BLOOD GAS (G3+)
ACID-BASE EXCESS: 1 mmol/L (ref 0.0–2.0)
Bicarbonate: 22.6 mmol/L (ref 20.0–28.0)
O2 Saturation: 100 %
PH ART: 7.527 — AB (ref 7.350–7.450)
PO2 ART: 186 mmHg — AB (ref 83.0–108.0)
TCO2: 23 mmol/L (ref 22–32)
pCO2 arterial: 27.2 mmHg — ABNORMAL LOW (ref 32.0–48.0)

## 2017-04-11 LAB — GLUCOSE, CAPILLARY
GLUCOSE-CAPILLARY: 70 mg/dL (ref 65–99)
GLUCOSE-CAPILLARY: 83 mg/dL (ref 65–99)
Glucose-Capillary: 75 mg/dL (ref 65–99)
Glucose-Capillary: 76 mg/dL (ref 65–99)
Glucose-Capillary: 80 mg/dL (ref 65–99)
Glucose-Capillary: 82 mg/dL (ref 65–99)
Glucose-Capillary: 96 mg/dL (ref 65–99)

## 2017-04-11 LAB — BASIC METABOLIC PANEL
Anion gap: 10 (ref 5–15)
BUN: 11 mg/dL (ref 6–20)
CHLORIDE: 101 mmol/L (ref 101–111)
CO2: 18 mmol/L — ABNORMAL LOW (ref 22–32)
Calcium: 7.9 mg/dL — ABNORMAL LOW (ref 8.9–10.3)
Creatinine, Ser: 0.85 mg/dL (ref 0.61–1.24)
GFR calc Af Amer: >60 mL/min (ref >60)
GFR calc non Af Amer: >60 mL/min (ref >60)
Glucose, Bld: 104 mg/dL — ABNORMAL HIGH (ref 65–99)
POTASSIUM: 3.5 mmol/L (ref 3.5–5.1)
Sodium: 129 mmol/L — ABNORMAL LOW (ref 135–145)

## 2017-04-11 LAB — TYPE AND SCREEN
ABO/RH(D): O POS
ANTIBODY SCREEN: NEGATIVE

## 2017-04-11 LAB — AMMONIA: AMMONIA: 20 umol/L (ref 9–35)

## 2017-04-11 MED ORDER — MIDAZOLAM HCL 2 MG/2ML IJ SOLN
10.0000 mg | Freq: Once | INTRAMUSCULAR | Status: AC
  Administered 2017-04-11: 10 mg via INTRAVENOUS

## 2017-04-11 MED ORDER — LEVETIRACETAM 100 MG/ML PO SOLN
1000.0000 mg | Freq: Two times a day (BID) | ORAL | Status: DC
  Filled 2017-04-11: qty 10

## 2017-04-11 MED ORDER — SODIUM CHLORIDE 0.9 % IV SOLN
INTRAVENOUS | Status: DC
  Administered 2017-04-11 – 2017-04-13 (×3): via INTRAVENOUS
  Administered 2017-04-14: 50 mL/h via INTRAVENOUS
  Administered 2017-04-15 – 2017-04-19 (×6): via INTRAVENOUS

## 2017-04-11 MED ORDER — LEVETIRACETAM 100 MG/ML PO SOLN
1500.0000 mg | Freq: Two times a day (BID) | ORAL | Status: DC
  Administered 2017-04-11 – 2017-04-18 (×15): 1500 mg
  Filled 2017-04-11 (×15): qty 15

## 2017-04-11 MED ORDER — LORAZEPAM 2 MG/ML IJ SOLN
INTRAMUSCULAR | Status: AC
  Administered 2017-04-11: 2 mg
  Filled 2017-04-11: qty 2

## 2017-04-11 MED ORDER — SODIUM CHLORIDE 0.9 % IV SOLN
1500.0000 mg | INTRAVENOUS | Status: DC
  Filled 2017-04-11: qty 30

## 2017-04-11 MED ORDER — MIDAZOLAM HCL 2 MG/2ML IJ SOLN
INTRAMUSCULAR | Status: AC
  Filled 2017-04-11: qty 10

## 2017-04-11 MED ORDER — SODIUM CHLORIDE 0.9 % IV SOLN
200.0000 mg | Freq: Once | INTRAVENOUS | Status: AC
  Administered 2017-04-11: 200 mg via INTRAVENOUS
  Filled 2017-04-11: qty 20

## 2017-04-11 MED ORDER — LORAZEPAM 2 MG/ML IJ SOLN
2.0000 mg | Freq: Once | INTRAMUSCULAR | Status: AC
  Administered 2017-04-11: 2 mg via INTRAVENOUS

## 2017-04-11 NOTE — Progress Notes (Addendum)
Subjective: Patient reports (vent)  Objective: Vital signs in last 24 hours: Temp:  [100 F (37.8 C)-100.6 F (38.1 C)] 100.4 F (38 C) (03/26 0730) Pulse Rate:  [71-90] 80 (03/26 0730) Resp:  [0-20] 16 (03/26 0730) BP: (90-136)/(60-87) 125/73 (03/26 0730) SpO2:  [95 %-100 %] 100 % (03/26 0730) Arterial Line BP: (88-162)/(58-95) 146/67 (03/26 0730) FiO2 (%):  [40 %] 40 % (03/26 0600)  Intake/Output from previous day: 03/25 0701 - 03/26 0700 In: 719.4 [I.V.:519.4; NG/GT:80; IV Piggyback:100] Out: 1120 [Urine:1040; Drains:80] Intake/Output this shift: No intake/output data recorded.  Opens eyes to noxious stimulation. Not sedated. Vent support continues. Mitten right hand following report of pulling tube, but no purposeful movements observed this visit. Significant RUE tremor. Drain ~66ml overnight. Drsg secure.   Lab Results: Recent Labs    04/10/17 0120 04/10/17 0549  WBC  --  14.9*  HGB 13.6 13.3  HCT 40.0 37.0*  PLT  --  296   BMET Recent Labs    04/10/17 0120 04/10/17 0549  NA 128* 126*  K 4.1 3.8  CL  --  98*  CO2  --  18*  GLUCOSE  --  147*  BUN  --  13  CREATININE  --  1.06  CALCIUM  --  8.3*    Studies/Results: Ct Head Wo Contrast  Result Date: 04/10/2017 CLINICAL DATA:  Subdural hemorrhage follow-up. EXAM: CT HEAD WITHOUT CONTRAST TECHNIQUE: Contiguous axial images were obtained from the base of the skull through the vertex without intravenous contrast. COMPARISON:  Multiple priors, most recent 04/09/2017. FINDINGS: Brain: Patient is status post RIGHT craniotomy and evacuation of subdural. Subdural drain has been left in place. There is decreased residual extra-axial collection on the RIGHT, mixed attenuation, with some superimposed pneumocephalus, measuring up to 10 mm thick on image 16 series 3. Largest residual collection is over the frontal lobe. Tentorial collection on the RIGHT redemonstrated and stable. Small extra-axial hygroma on the LEFT, stable.  Stable parenchymal calcification, LEFT basal ganglia. No significant midline shift. LEFT ventricular catheter unchanged in position. Vascular:   No signs of intracranial large vessel occlusion. Skull: Craniotomy/craniectomy defects, most notable posterior fossa midline. Acute RIGHT-sided operative site appears uncomplicated. Sinuses/Orbits: No sinus or mastoid disease. Other: None. IMPRESSION: Improved appearance status post RIGHT craniotomy for acute subdural evacuation. Residual 10 mm thick extra-axial collection over the RIGHT convexity, with a subdural drain in place along with pneumocephalus. Continued surveillance is warranted. Electronically Signed   By: Staci Righter M.D.   On: 04/10/2017 11:51   Portable Chest X-ray  Result Date: 04/10/2017 CLINICAL DATA:  Endotracheal tube placement EXAM: PORTABLE CHEST 1 VIEW COMPARISON:  Chest radiograph 04/09/2017 FINDINGS: Endotracheal tube tip is at the level of the clavicular heads, in satisfactory position. The lungs are clear. No pleural effusion. IMPRESSION: Radiographically appropriate position of endotracheal tube. Electronically Signed   By: Ulyses Jarred M.D.   On: 04/10/2017 04:35   Dg Chest Port 1 View  Result Date: 04/09/2017 CLINICAL DATA:  47 year old male with fever. EXAM: PORTABLE CHEST 1 VIEW COMPARISON:  Chest radiograph dated 04/09/2017 FINDINGS: There is shallow inspiration with minimal bibasilar atelectatic changes. No focal consolidation, pleural effusion, or pneumothorax. Stable cardiac silhouette. No acute osseous pathology. Partially visualized VP shunt over the left chest. IMPRESSION: No active disease. Electronically Signed   By: Anner Crete M.D.   On: 04/09/2017 22:48   Dg Abd Portable 1v  Result Date: 04/10/2017 CLINICAL DATA:  Status post orogastric tube placement EXAM: PORTABLE  ABDOMEN - 1 VIEW COMPARISON:  None in PACs FINDINGS: The orogastric tube tip and proximal port lie in the gastric body. The bowel gas pattern does  not suggest obstruction. IMPRESSION: Reasonable positioning of the orogastric tube with both proximal port and tip lie in the gastric body. Electronically Signed   By: David  Martinique M.D.   On: 04/10/2017 12:21    Assessment/Plan:   LOS: 2 days  Continue support. Wean vent as per CCM.  Orders rec'd from Dr. [name]: increase Keppra to 1000mg  bid and Neurology consult for possible seizures. Spoke with Dr. Lorraine Lax, Neurology, who agrees to visit.  Verdis Prime 04/11/2017, 8:05 AM  Neurology to evaluate patient.

## 2017-04-11 NOTE — Progress Notes (Signed)
Aline pressure monitor was changed due to a poor waveform.

## 2017-04-11 NOTE — Consult Note (Addendum)
PULMONARY / CRITICAL CARE MEDICINE   Name: Glen [name] MRN: [id number] DOB: 1970-02-19    ADMISSION DATE:  04/09/2017 CONSULTATION DATE:  04/10/2017  REFERRING MD:  Dr. 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  Apparently fell 2 weeks ago hitting his head.  Complained of a headache to his mother on the morning of 3/24 while at church.  Additionally, he was recently changed from depakote to [place] due to hallucinations by Dr. Krista Blue who treats him for seizures.   There was concern for sepsis with initial presentation of low-grade fever and elevated WBC at [facility] and was treated with rocephin.  CT head showed a large subdural hematoma on the right.  He was transferred to [facility] ED, lethargic with temperature of 102.8, unknown baseline mental status, admitted to Neurosurgery and taken to OR for craniotomy and hematoma evacuation.  He was given Novoseven in OR as patient had been taking arixta.  ENT consulted in OR due to anesthesia team having difficulty passing ETT past glottis with inspiratory stridor, however remained easy to ventilate with bag/mask ventilation where a 7.0 ETT was placed using a Storz laryngoscope. Patient returns to ICU post-operatively and PCCM consulted for ventilator management and medical management.    SUBJECTIVE:  ON vent overnight. Seizure like activity this am prompting deep sedation and continuous EEG.  VITAL SIGNS: BP 132/89   Pulse 80   Temp (!) 100.4 F (38 C)   Resp 16   Ht 5\' 10"   (1.778 m)   Wt 99.8 kg (220 lb)   SpO2 100%   BMI 31.57 kg/m   HEMODYNAMICS:    VENTILATOR SETTINGS: Vent Mode: PRVC FiO2 (%):  [40 %] 40 % Set Rate:  [16 bmp] 16 bmp Vt Set:  [580 mL] 580 mL PEEP:  [5 cmH20] 5 cmH20 Plateau Pressure:  [12 cmH20-16 cmH20] 14 cmH20  INTAKE / OUTPUT: I/O last 3 completed shifts: In: 2372.4 [I.V.:2172.4; Other:20; NG/GT:80; IV Piggyback:100] Out: 5284 [Urine:1240; Drains:150; Blood:100]  PHYSICAL EXAMINATION:  General:  Critically ill adult male sedated on MV  HEENT: Post surgical changes, PERRL, no appreciable JVD Neuro:  Sedated, R arm tremor to pain.  CV:  RRR, no MRG PULM: even/non-labored on MV, lungs bilaterally coarse throughout GI: soft, non-distended. Hypoactive BS Extremities: warm/dry, no edema  Skin: no rashes   LABS:  BMET Recent Labs  Lab 04/10/17 0120 04/10/17 0549  NA 128* 126*  K 4.1 3.8  CL  --  98*  CO2  --  18*  BUN  --  13  CREATININE  --  1.06  GLUCOSE  --  147*    Electrolytes Recent Labs  Lab 04/10/17 0549  CALCIUM 8.3*  MG 1.7    CBC Recent Labs  Lab 04/10/17 0120 04/10/17 0549  WBC  --  14.9*  HGB 13.6 13.3  HCT 40.0 37.0*  PLT  --  296    Coag's Recent Labs  Lab 04/10/17 0549  INR 0.74    Sepsis Markers Recent Labs  Lab 04/10/17 0549  LATICACIDVEN 1.4  PROCALCITON <0.10    ABG Recent Labs  Lab 04/10/17 0120 04/10/17 0408  PHART  7.384 7.409  PCO2ART 37.1 36.2  PO2ART 144.0* 469.0*    Liver Enzymes Recent Labs  Lab 04/10/17 0549  AST 58*  ALT 12*  ALKPHOS 53  BILITOT 1.5*  ALBUMIN 3.1*    Cardiac Enzymes No results for input(s): TROPONINI, PROBNP in the last 168 hours.  Glucose Recent Labs  Lab 04/10/17 1607 04/10/17 2012 04/10/17 2323 04/11/17 0334 04/11/17 0809 04/11/17 1142  GLUCAP 123* 107* 120* 96 82 75    Imaging Portable Chest Xray  Result Date: 04/11/2017 CLINICAL DATA:  Subdural hematoma, low-grade fever. EXAM: PORTABLE CHEST 1 VIEW  COMPARISON:  04/10/2017. FINDINGS: Normal heart size. Clear lung fields. ET tube 3.9 cm above carina. Orogastric tube coiled in stomach. Ventriculoperitoneal shunt catheter courses along the LEFT chest. IMPRESSION: Stable chest. No pulmonary consolidation or significant atelectasis. Electronically Signed   By: Staci Righter M.D.   On: 04/11/2017 08:54   STUDIES:  CTH at [facility] CT head 3/25 > Improved appearance status post RIGHT craniotomy for acute subdural evacuation. Residual 10 mm thick extra-axial collection over the RIGHT convexity, with a subdural drain in place along with pneumocephalus.  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 Also presenting with fever and elevated WBC of unclear etiology.  Found to have large R SDH taken to OR for crani and evacuation with residual JP drain.  Reversed with Novoseven.  Difficult airway in OR requiring emergent consult per ENT, 7.0 ETT placed.  Returns to ICU on MV. Initial thought is that he would be extubated the following day, however, he developed seizures requiring deep sedation with propofol and continuous EEG.   ASSESSMENT / PLAN:  PULMONARY A: Difficult airway- inspiratory stridor/ unable to pass glottis-  intubated per ENT in OR  Acute respiratory insufficiency in the post-operative setting Hx of trach P:   Full vent support ABG CXR VAP bundle  CARDIOVASCULAR A:  Hypotension, likely related to sedation, r/o other etiology such as septic/ hemorrhagic shock > Improved. Off neo Hx HTN, HLD P:  Tele monitoring Neo for goal MAP > 65; SBP < 160 Labetalol prn   RENAL A:   Hyponatremia  P:   Need to repeat BMP STAT to determine where to  go with Na. KVO IVF for now until results.  Trend BMP / urinary output/ daily weights  Replace electrolytes as indicated  GASTROINTESTINAL A:   No known acute issues P:   PPI Not ready for enteral feedings  HEMATOLOGIC A:   Hx HIT- on Arixta preadmit. Reversed with Novoseven in OR  P:  CBC now Coags now SCDs only   INFECTIOUS A:   Febrile and leukocytosis: could be explained by SDH, seizure.  -CXR clear, UA non-infectious.  P:   BC now PCT < 10, hold antibiotics for now. Low threshold to start, however, source unclear.  Trend fever curve and WBC Tylenol and cooling blanket prn  Consider LP  ENDOCRINE A:   Hypothyroidism    P:   CBG q 4 while NPO Continue synthroid per tube  NEUROLOGIC A:   Acute right SDH s/p fall while on Arixta s/p crani/ evaluation 3/24 Seizures with history of same.  Hx VP shunt, glioblastoma, and autoimmune cerebritis  P:   RASS goal: -1/-2 PAD protocol with propofol  Keppra, Vimpat per neurology Continuous EEG Neurology and NSGY following Seizure precautions.    FAMILY  - Updates:  No family at bedside.   - Inter-disciplinary family meet or Palliative Care meeting due by:  3/31  Georgann Housekeeper, AGACNP-BC [place] Pulmonology/Critical Care Pager [phone] or [phone]  04/11/2017 2:28 PM  Attending:  I have seen and examined the patient with nurse practitioner/resident and agree with the note above.  We formulated the plan together and I elicited the following history.    Subjective: Respiratory failure  PHYSICAL EXAMINATION:  General:  Critically ill adult male sedated on MV  HEENT: Post surgical changes, PERRL, no appreciable JVD Neuro:  Sedated, R arm tremor to pain.  CV:  RRR, no MRG PULM: even/non-labored on MV, lungs bilaterally coarse throughout GI: soft, non-distended. Hypoactive BS Extremities: warm/dry, no edema. Twitches observed in the left arm. Skin: no rashes    Objective: Vitals:   04/11/17  1500 04/11/17 1524 04/11/17 1600 04/11/17 1700  BP: 130/68  (!) 139/108 123/68  Pulse: 80 81 81 76  Resp: 18 16 12 14   Temp: (!) 100.8 F (38.2 C) (!) 100.8 F (38.2 C) (!) 100.9 F (38.3 C) (!) 100.9 F (38.3 C)  TempSrc:      SpO2: 100% 100% 100% 100%  Weight:      Height:       Vent Mode: PRVC FiO2 (%):  [40 %] 40 % Set Rate:  [16 bmp] 16 bmp Vt Set:  [580 mL] 580 mL PEEP:  [5 cmH20] 5 cmH20 Plateau Pressure:  [13 cmH20-18 cmH20] 18 cmH20  Intake/Output Summary (Last 24 hours) at 04/11/2017 1712 Last data filed at 04/11/2017 1700 Gross per 24 hour  Intake 370.43 ml  Output 690 ml  Net -319.57 ml       CBC    Component Value Date/Time   WBC 11.4 (H) 04/11/2017 1413   RBC 3.73 (L) 04/11/2017 1413   HGB 10.8 (L) 04/11/2017 1413   HCT 32.1 (L) 04/11/2017 1413   PLT 187 04/11/2017 1413   MCV 86.1 04/11/2017 1413   MCH 29.0 04/11/2017 1413   MCHC 33.6 04/11/2017 1413   RDW 13.9 04/11/2017 1413   LYMPHSABS 1.9 04/11/2017 1413   MONOABS 0.9 04/11/2017 1413   EOSABS 0.0 04/11/2017 1413   BASOSABS 0.0 04/11/2017 1413    BMET    Component Value Date/Time   NA 129 (L) 04/11/2017 1424   K 3.5 04/11/2017 1424   CL 101 04/11/2017 1424   CO2 18 (L) 04/11/2017 1424   GLUCOSE 104 (H) 04/11/2017 1424   BUN 11 04/11/2017 1424   CREATININE 0.85 04/11/2017 1424   CALCIUM 7.9 (L) 04/11/2017 1424   GFRNONAA >60 04/11/2017 1424   GFRAA >60 04/11/2017 1424      Impression/Plan: See above for impressions and recommendations.    Jesus Genera, MD [place] P[facility]M Pager: [phone]

## 2017-04-11 NOTE — Progress Notes (Signed)
LTM started. Event button tested. Nurse educated on event button. Dr Edison Simon notified.

## 2017-04-11 NOTE — Progress Notes (Signed)
EEG reviewed following call from nursing regarding rhythmic bilateral arm movements which subsided after increasing propofol to a rate of 20. Intermittent eye movement artifact consistent with rhythmic eye movements is seen at times in frontal leads, on a background of diffuse slowing, EMG artifact and beta activity. No definite electrographic seizures appreciated. Continue with current anticonvulsant regimen and propofol sedation.   Electronically signed: Dr. Kerney Elbe

## 2017-04-11 NOTE — Progress Notes (Signed)
Patient ID: Glen [name], male   DOB: 07/13/1970, 47 y.o.   MRN: [id number] Sedated since seizure activity this am, vent support continues. EEG monitoring.  Appreciate Neurology's & CCM's assistance.  JP without significant o/p today. Drsg intact.

## 2017-04-11 NOTE — Care Management Note (Signed)
Case Management Note  Patient Details  Name: Glen [name] MRN: [id number] Date of Birth: [birth year]  Subjective/Objective:   Pt admitted on 04/09/17 with increasing confusion after falling approximately 2 weeks ago.  Pt found to have large RT SDH; s/p craniotomy on 04/09/17.  PTA, pt resided at home with mother.                   Action/Plan: Pt currently remains sedated and intubated.  Will follow for discharge planning as pt progresses.    Expected Discharge Date:                  Expected Discharge Plan:     In-House Referral:     Discharge planning Services  CM Consult  Post Acute Care Choice:    Choice offered to:     DME Arranged:    DME Agency:     HH Arranged:    HH Agency:     Status of Service:  In process, will continue to follow  If discussed at Long Length of Stay Meetings, dates discussed:    Additional Comments:  Reinaldo Raddle, RN, BSN  Trauma/Neuro ICU Case Manager [phone]

## 2017-04-11 NOTE — Progress Notes (Signed)
MD paged second time about possible seizure activity. No new orders at this time. Will continue to monitor.

## 2017-04-11 NOTE — Progress Notes (Signed)
Initial Nutrition Assessment  DOCUMENTATION CODES:   Not applicable  INTERVENTION:   Recommend:  Vital AF 1.2 @ 70 ml/hr (1680 ml/day) Provides: 2016 kcal, 126 grams protein, and 1362 ml free water.  TF regimen and propofol at current rate providing 2174 total kcal/day   NUTRITION DIAGNOSIS:   Inadequate oral intake related to inability to eat as evidenced by NPO status.  GOAL:   Patient will meet greater than or equal to 90% of their needs  MONITOR:   Vent status, I & O's  REASON FOR ASSESSMENT:   Ventilator    ASSESSMENT:   Pt with PMH of glioblastoma s/p surgical removal 1993, autoimmune cerebritis, seizures, CVA, HIT who was admitted 3/24 from [place] with increased confusion x 2 days PTA. He fell and hit his head 2 weeks PTA. Tx to [facility], CT shows large R SDH now s/p crani with JP drain placement 3/25.     Pt discussed during ICU rounds and with RN.  3/26 started having seizures, remains intubated now sedated with propofol No family present, exam WNL. Pt appears closer to 201 lb weight from 1/19.   Patient is currently intubated on ventilator support MV: 9 L/min Temp (24hrs), Avg:100.3 F (37.9 C), Min:100 F (37.8 C), Max:100.8 F (38.2 C) JP drain: 80 ml out x 24 hr  Propofol: 6 ml/hr = 158 kcal  Medications reviewed and include: NS with 20 mEq KCl/L @ 75 ml/hr Labs reviewed: Na 126 (L), TG: 103 MAP 91  - no pressors  NUTRITION - FOCUSED PHYSICAL EXAM:    Most Recent Value  Orbital Region  No depletion  Upper Arm Region  No depletion  Thoracic and Lumbar Region  No depletion  Buccal Region  Unable to assess  Temple Region  No depletion  Clavicle Bone Region  No depletion  Clavicle and Acromion Bone Region  No depletion  Scapular Bone Region  Unable to assess  Dorsal Hand  No depletion  Patellar Region  No depletion  Anterior Thigh Region  No depletion  Posterior Calf Region  No depletion  Edema (RD Assessment)  None  Hair  Reviewed  Eyes   Unable to assess  Mouth  Unable to assess  Skin  Reviewed  Nails  Reviewed       Diet Order:  Diet NPO time specified  EDUCATION NEEDS:   No education needs have been identified at this time  Skin:  Skin Assessment: (head incision)  Last BM:  unknown  Height:   Ht Readings from Last 1 Encounters:  04/10/17 5\' 10"  (1.778 m)    Weight:   Wt Readings from Last 1 Encounters:  04/09/17 220 lb (99.8 kg)  Weight 02/13/17 was 201 lb (91.2 kg)  Ideal Body Weight:  75.4 kg  BMI:  Body mass index is 31.57 kg/m.  Estimated Nutritional Needs:   Kcal:  2170  Protein:  115-130 grams  Fluid:  > 2 L/day  Maylon Peppers RD, LDN, CNSC [phone] Pager [phone] After Hours Pager

## 2017-04-11 NOTE — Consult Note (Addendum)
Neurology Consultation  Reason for Consult: Seizure activity  Referring Physician: Dr. [name]  CC: Twitching, rhythmic jerking   History is obtained from chart review.  HPI: Glen [name] is a 47 y.o. male with a pmhx of glioblastoma s/p surgical removal in 1993, autoimmue cerebritis, CVA, and HIT who presented on 3/24 as a transfer from [facility] ER. He presented with increased confusion over the prior 2 days. Unclear baseline mental status. Mother reported that he normally walks with a walker, speaks and interacts with family. Patient apparently fell and hit his head 2 weeks ago. CT scan of his head demonstrated a large subdural right hematoma. Patient was transferred to [facility] and underwent operative drainage on 3/25 with craniotomy and JP drain placement. He follow with Dr. Krista Blue for his seizures and was on depakote, which was changed to Keppra (750 BID) due to hallucinations.   This AM, patient developed rhythmic eye blinking, jerking of his right extremities with episodes of generalized shaking.   ROS: Unable to obtain due to altered mental status.   Past Medical History:  Diagnosis Date  . Brain cancer ([place])   . Hypercholesteremia   . Seizure ([place])   . Stroke ([place])   . Thyroid disorder     Family History  Adopted: Yes    Social History:   reports that he has never smoked. He has never used smokeless tobacco. He reports that he does not drink alcohol or use drugs.  Medications  Current Facility-Administered Medications:  .  0.9 % NaCl with KCl 20 mEq/ L  infusion, , Intravenous, Continuous, Erline Levine, MD, Last Rate: 10 mL/hr at 04/11/17 0800 .  acetaminophen (TYLENOL) tablet 650 mg, 650 mg, Oral, Q4H PRN **OR** acetaminophen (TYLENOL) suppository 650 mg, 650 mg, Rectal, Q4H PRN, Erline Levine, MD, 650 mg at 04/10/17 0755 .  atorvastatin (LIPITOR) tablet 20 mg, 20 mg, Oral, Daily, Erline Levine, MD, 20 mg at 04/10/17 1237 .  bisacodyl (DULCOLAX) suppository  10 mg, 10 mg, Rectal, Daily PRN, Jennelle Human B, NP .  chlorhexidine gluconate (MEDLINE KIT) (PERIDEX) 0.12 % solution 15 mL, 15 mL, Mouth Rinse, BID, Simpson, Paula B, NP, 15 mL at 04/11/17 0724 .  Chlorhexidine Gluconate Cloth 2 % PADS 6 each, 6 each, Topical, Q0600, Erline Levine, MD, 6 each at 04/11/17 0505 .  docusate (COLACE) 50 MG/5ML liquid 100 mg, 100 mg, Per Tube, BID PRN, Jennelle Human B, NP .  fentaNYL (SUBLIMAZE) injection 100 mcg, 100 mcg, Intravenous, Q2H PRN, Jennelle Human B, NP, 100 mcg at 04/10/17 2100 .  FLUoxetine (PROZAC) capsule 20 mg, 20 mg, Oral, Daily, Erline Levine, MD, 20 mg at 04/10/17 1237 .  fosPHENYtoin (CEREBYX) 1,500 mg PE in sodium chloride 0.9 % 50 mL IVPB, 1,500 mg PE, Intravenous, STAT, Aroor, Karena Addison R, MD .  labetalol (NORMODYNE,TRANDATE) injection 10-40 mg, 10-40 mg, Intravenous, Q10 min PRN, Erline Levine, MD .  lacosamide (VIMPAT) 200 mg in sodium chloride 0.9 % 25 mL IVPB, 200 mg, Intravenous, Once, Aroor, Sushanth R, MD .  levETIRAcetam (KEPPRA) 100 MG/ML solution 1,500 mg, 1,500 mg, Per Tube, BID, Aroor, Karena Addison R, MD .  levothyroxine (SYNTHROID, LEVOTHROID) tablet 50 mcg, 50 mcg, Oral, QAC breakfast, Erline Levine, MD .  MEDLINE mouth rinse, 15 mL, Mouth Rinse, QID, Simpson, Paula B, NP, 15 mL at 04/11/17 0457 .  midazolam (VERSED) 2 MG/2ML injection, , , ,  .  mupirocin ointment (BACTROBAN) 2 % 1 application, 1 application, Nasal, BID, Erline Levine, MD,  1 application at 28/31/51 2232 .  ondansetron (ZOFRAN) tablet 4 mg, 4 mg, Oral, Q4H PRN **OR** ondansetron (ZOFRAN) injection 4 mg, 4 mg, Intravenous, Q4H PRN, Erline Levine, MD .  pantoprazole (PROTONIX) injection 40 mg, 40 mg, Intravenous, Graciela Husbands, MD, 40 mg at 04/10/17 2234 .  phenylephrine (NEO-SYNEPHRINE) 10 mg in sodium chloride 0.9 % 250 mL (0.04 mg/mL) infusion, 0-400 mcg/min, Intravenous, Titrated, Oletta Darter Virgina Evener, MD, Stopped at 04/10/17 1225 .  propofol (DIPRIVAN) 1000  MG/100ML infusion, 5-80 mcg/kg/min, Intravenous, Titrated, Anders Simmonds, MD, Last Rate: 12 mL/hr at 04/11/17 0847, 20 mcg/kg/min at 04/11/17 0847   Exam: Current vital signs: BP (!) 131/114   Pulse 75   Temp (!) 100.6 F (38.1 C)   Resp 14   Ht _0  (1.778 m)   Wt 220 lb (99.8 kg)   SpO2 100%   BMI 31.57 kg/m  Vital signs in last 24 hours: Temp:  [100 F (37.8 C)-100.6 F (38.1 C)] 100.6 F (38.1 C) (03/26 0800) Pulse Rate:  [71-90] 75 (03/26 0831) Resp:  [0-20] 14 (03/26 0831) BP: (90-136)/(60-114) 131/114 (03/26 0831) SpO2:  [95 %-100 %] 100 % (03/26 0831) Arterial Line BP: (88-162)/(58-86) 154/74 (03/26 0800) FiO2 (%):  [40 %] 40 % (03/26 0831)  GENERAL: Intubated, unresponsive not on sedation  HEENT: - Biting ET tube LUNGS - On vent CV - RRR Ext: warm, well perfused, no edema  NEURO:  Mental Status: Intubated, unresponsive on vent, no sedation  Cranial Nerves: Unable to assess, no gross deficits Motor: Repetitive eye blinking, muscle twitching, and rhythmic jerking of all extremities (L>R) Tone: is normal and bulk is normal Sensation- Unable to assess Coordination: FTN intact bilaterally, no ataxia in BLE. Gait- deferred   Labs I have reviewed labs in epic and the results pertinent to this consultation are:  CBC    Component Value Date/Time   WBC 14.9 (H) 04/10/2017 0549   RBC 4.40 04/10/2017 0549   HGB 13.3 04/10/2017 0549   HCT 37.0 (L) 04/10/2017 0549   PLT 296 04/10/2017 0549   MCV 84.1 04/10/2017 0549   MCH 30.2 04/10/2017 0549   MCHC 35.9 04/10/2017 0549   RDW 13.0 04/10/2017 0549   LYMPHSABS 1.5 04/10/2017 0549   MONOABS 0.5 04/10/2017 0549   EOSABS 0.0 04/10/2017 0549   BASOSABS 0.0 04/10/2017 0549    CMP     Component Value Date/Time   NA 126 (L) 04/10/2017 0549   K 3.8 04/10/2017 0549   CL 98 (L) 04/10/2017 0549   CO2 18 (L) 04/10/2017 0549   GLUCOSE 147 (H) 04/10/2017 0549   BUN 13 04/10/2017 0549   CREATININE 1.06  04/10/2017 0549   CALCIUM 8.3 (L) 04/10/2017 0549   PROT 5.8 (L) 04/10/2017 0549   ALBUMIN 3.1 (L) 04/10/2017 0549   AST 58 (H) 04/10/2017 0549   ALT 12 (L) 04/10/2017 0549   ALKPHOS 53 04/10/2017 0549   BILITOT 1.5 (H) 04/10/2017 0549   GFRNONAA >60 04/10/2017 0549   GFRAA >60 04/10/2017 0549    Lipid Panel     Component Value Date/Time   TRIG 103 04/10/2017 0415    Imaging I have reviewed the images obtained:  3/25 CT-scan of the brain IMPRESSION: Improved appearance status post RIGHT craniotomy for acute subdural evacuation.  Residual 10 mm thick extra-axial collection over the RIGHT convexity, with a subdural drain in place along with pneumocephalus. Continued surveillance is warranted.   Assessment: 47 yo M with a pmhx  of glioblastoma s/p surgical removal in 1993, autoimmue cerebritis, CVA, seizure disorder on keppra, and HIT who presented with AMS after a fall 2 weeks ago. CT head demonstrated a right subdural hematoma. He is now s/p operative drainage on 3/25 with craniotomy and JP drain placement per neurosurgery. This morning patient noted to be having right sided tremors with repetitive blinking concerning for NCSE.He received Ativan x15m and started to rhythmic jerking of left arm and leg and followed by violent semi- purposeful movements. He was then started on propofol.  Impression: Status epilepticus   Recommendations: -- STAT EEG -- Start sedation, propofol  -- Load with Vimpat 200 mg  -- Maximize Keppra, 1500 mg BID -- Check ammonia level -- Seizure precautions  -- Will follow   CVelna Ochs M.D. - PGY2 04/11/2017, 9:01 AM     NEUROHOSPITALIST ADDENDUM I agree with formulated plan as documented above by PAC/Residen with changes made as above.   I was at bedside when patient was having seizure like activity. Loaded with Vimpat.  I reviewed stat LTM EEG - which was connected after patient received Vimpat and started on sedation. No seizures were  noted. Will continue EEG till tomorrow.    This patient is neurologically critically ill due to SDH, status epilepticus.   He is at risk for significant risk of neurological worsening from cerebral edema,  death from brain herniation, heart failure, hemorrhagic conversion, infection, respiratory failure and worsening seizure. This patient's care requires constant monitoring of vital signs, hemodynamics, respiratory and cardiac monitoring, review of multiple databases, neurological assessment, discussion with family, other specialists and medical decision making of high complexity.  I spent 60 minutes of neurocritical time in the care of this patient, including initial evaluation and management of seizure, reviewing EEG, discussing with epileptologist, reexamining patient in the evening.    Will continue to follow.   SKarena AddisonAroor MD [facility] [phone] If 7pm to 7am, please call on call as listed on AMION.

## 2017-04-11 NOTE — Progress Notes (Signed)
1600 CBG 70 gave 4 oz of OJ. Rechecked.  CBG was  76.

## 2017-04-12 ENCOUNTER — Inpatient Hospital Stay (HOSPITAL_COMMUNITY): Payer: Medicare Other

## 2017-04-12 DIAGNOSIS — G40901 Epilepsy, unspecified, not intractable, with status epilepticus: Secondary | ICD-10-CM

## 2017-04-12 DIAGNOSIS — R569 Unspecified convulsions: Secondary | ICD-10-CM

## 2017-04-12 LAB — GLUCOSE, CAPILLARY
GLUCOSE-CAPILLARY: 102 mg/dL — AB (ref 65–99)
GLUCOSE-CAPILLARY: 110 mg/dL — AB (ref 65–99)
GLUCOSE-CAPILLARY: 115 mg/dL — AB (ref 65–99)
GLUCOSE-CAPILLARY: 85 mg/dL (ref 65–99)
GLUCOSE-CAPILLARY: 89 mg/dL (ref 65–99)
Glucose-Capillary: 95 mg/dL (ref 65–99)

## 2017-04-12 LAB — CBC
HCT: 32 % — ABNORMAL LOW (ref 39.0–52.0)
Hemoglobin: 10.8 g/dL — ABNORMAL LOW (ref 13.0–17.0)
MCH: 28.8 pg (ref 26.0–34.0)
MCHC: 33.8 g/dL (ref 30.0–36.0)
MCV: 85.3 fL (ref 78.0–100.0)
PLATELETS: 188 10*3/uL (ref 150–400)
RBC: 3.75 MIL/uL — ABNORMAL LOW (ref 4.22–5.81)
RDW: 13.4 % (ref 11.5–15.5)
WBC: 10.1 10*3/uL (ref 4.0–10.5)

## 2017-04-12 LAB — CORTISOL: CORTISOL PLASMA: 5.4 ug/dL

## 2017-04-12 LAB — PHOSPHORUS
PHOSPHORUS: 2.5 mg/dL (ref 2.5–4.6)
Phosphorus: 2.9 mg/dL (ref 2.5–4.6)

## 2017-04-12 LAB — OSMOLALITY, URINE: Osmolality, Ur: 602 mOsm/kg (ref 300–900)

## 2017-04-12 LAB — BASIC METABOLIC PANEL
Anion gap: 12 (ref 5–15)
BUN: 8 mg/dL (ref 6–20)
CALCIUM: 8.2 mg/dL — AB (ref 8.9–10.3)
CO2: 18 mmol/L — ABNORMAL LOW (ref 22–32)
CREATININE: 0.72 mg/dL (ref 0.61–1.24)
Chloride: 100 mmol/L — ABNORMAL LOW (ref 101–111)
GFR calc Af Amer: >60 mL/min (ref >60)
GLUCOSE: 94 mg/dL (ref 65–99)
Potassium: 3.2 mmol/L — ABNORMAL LOW (ref 3.5–5.1)
SODIUM: 130 mmol/L — AB (ref 135–145)

## 2017-04-12 LAB — MAGNESIUM
MAGNESIUM: 2.1 mg/dL (ref 1.7–2.4)
Magnesium: 1.9 mg/dL (ref 1.7–2.4)

## 2017-04-12 LAB — OSMOLALITY: Osmolality: 281 mOsm/kg (ref 275–295)

## 2017-04-12 MED ORDER — ORAL CARE MOUTH RINSE
15.0000 mL | OROMUCOSAL | Status: DC
  Administered 2017-04-12 – 2017-04-13 (×7): 15 mL via OROMUCOSAL

## 2017-04-12 MED ORDER — VITAL AF 1.2 CAL PO LIQD
1000.0000 mL | ORAL | Status: DC
  Administered 2017-04-12 – 2017-04-13 (×2): 1000 mL

## 2017-04-12 MED ORDER — PRO-STAT SUGAR FREE PO LIQD
30.0000 mL | Freq: Every day | ORAL | Status: DC
  Administered 2017-04-12 – 2017-04-17 (×6): 30 mL
  Filled 2017-04-12 (×6): qty 30

## 2017-04-12 NOTE — Progress Notes (Signed)
LTM discontinued.  

## 2017-04-12 NOTE — Progress Notes (Signed)
LTM continues. Electrodes checked and reset as needed.  No skin breakdown noted. Continue to monitor.

## 2017-04-12 NOTE — Progress Notes (Addendum)
Reason for consult:   Subjective: Questionable seizure activity overnight with bilateral arm movements requiring increase in propofol, however no definite seizure activity on EEG. This morning he is sedated on vent. Continues to have rhythmic eye blinking.   ROS: Unable to obtain due to poor mental status  Examination  Vital signs in last 24 hours: Temp:  [95.7 F (35.4 C)-100.9 F (38.3 C)] 98.2 F (36.8 C) (03/27 1000) Pulse Rate:  [58-99] 81 (03/27 1000) Resp:  [5-26] 14 (03/27 1000) BP: (83-150)/(56-108) 107/71 (03/27 1000) SpO2:  [90 %-100 %] 100 % (03/27 1000) Arterial Line BP: (69-155)/(50-108) 118/67 (03/27 1000) FiO2 (%):  [40 %] 40 % (03/27 0741)  General: Intubated, sedated on vent CVS: pulse-normal rate and rhythm RS: on vent  Extremities: normal   Neuro: MS: Opens eyes to sternal rub, does not follow commands. CN: pupils equal and reactive,  EOMI, face symmetric Motor: Unable to assess, will withdrawal to painful stimuli  Reflexes: 2+ bilaterally over patella Coordination: not tested Gait: not tested  Basic Metabolic Panel: Recent Labs  Lab 04/10/17 0120 04/10/17 0549 04/11/17 1424 04/12/17 0437  NA 128* 126* 129* 130*  K 4.1 3.8 3.5 3.2*  CL  --  98* 101 100*  CO2  --  18* 18* 18*  GLUCOSE  --  147* 104* 94  BUN  --  13 11 8   CREATININE  --  1.06 0.85 0.72  CALCIUM  --  8.3* 7.9* 8.2*  MG  --  1.7  --  2.1  PHOS  --   --   --  2.5    CBC: Recent Labs  Lab 04/10/17 0120 04/10/17 0549 04/11/17 1413 04/12/17 0437  WBC  --  14.9* 11.4* 10.1  NEUTROABS  --  13.0* 8.7*  --   HGB 13.6 13.3 10.8* 10.8*  HCT 40.0 37.0* 32.1* 32.0*  MCV  --  84.1 86.1 85.3  PLT  --  296 187 188     Coagulation Studies: Recent Labs    04/10/17 0549  LABPROT 10.3*  INR 0.74    Imaging Reviewed:     ASSESSMENT: 47 yo M with a pmhx of glioblastoma s/p surgical removal in 1993, autoimmue cerebritis, CVA, seizure disorder on keppra, and HIT who presented  with AMS after a fall 2 weeks ago. CT head demonstrated a right subdural hematoma. He is now s/p operative drainage on 3/25 with craniotomy and JP drain placement per neurosurgery. Yesterday morning noted have seizure like activity concerning for status epilepticus. Was started on LTM EEG without further definite seizure activity.   Push button events for right side tremors not seizures on EEG. Will discontinue LTM EEG monitoring.   IMPRESSION: Possible Status epilepticus - resolved   Plan: -- Wean sedation as possible -- Will d/c EEG -- Continue Keppra 1500 mg BID -- Seizure precautions -- Will follow    Velna Ochs, M.D. - PGY2 04/12/2017, 10:03 AM     NEUROHOSPITALIST ADDENDUM Seen and examined the patient today. I reviewed above note as documented above by PAC/Resident, few edits made to above note.  I agree with recommendations above.  Will follow.  Karena Addison Makinze Jani MD [facility] [phone]  If 7pm to 7am, please call on call as listed on AMION.

## 2017-04-12 NOTE — Progress Notes (Addendum)
Subjective: Patient reports (vent)  Objective: Vital signs in last 24 hours: Temp:  [95.7 F (35.4 C)-100.9 F (38.3 C)] 99 F (37.2 C) (03/27 0800) Pulse Rate:  [58-110] 93 (03/27 0800) Resp:  [5-26] 16 (03/27 0800) BP: (83-150)/(56-114) 102/65 (03/27 0800) SpO2:  [90 %-100 %] 100 % (03/27 0800) Arterial Line BP: (69-155)/(50-108) 97/60 (03/27 0800) FiO2 (%):  [40 %] 40 % (03/27 0741)  Intake/Output from previous day: 03/26 0701 - 03/27 0700 In: 692.4 [I.V.:647.4; IV Piggyback:45] Out: 8185 [UDJSH:7026; Drains:19] Intake/Output this shift: Total I/O In: 32 [I.V.:32] Out: -   Remains sedated with vent support. Tremor intermittent overnight. EEG continues. BP's soft but stable. JP ~10ml last 24hrs.  Lab Results: Recent Labs    04/11/17 1413 04/12/17 0437  WBC 11.4* 10.1  HGB 10.8* 10.8*  HCT 32.1* 32.0*  PLT 187 188   BMET Recent Labs    04/11/17 1424 04/12/17 0437  NA 129* 130*  K 3.5 3.2*  CL 101 100*  CO2 18* 18*  GLUCOSE 104* 94  BUN 11 8  CREATININE 0.85 0.72  CALCIUM 7.9* 8.2*    Studies/Results: Ct Head Wo Contrast  Result Date: 04/10/2017 CLINICAL DATA:  Subdural hemorrhage follow-up. EXAM: CT HEAD WITHOUT CONTRAST TECHNIQUE: Contiguous axial images were obtained from the base of the skull through the vertex without intravenous contrast. COMPARISON:  Multiple priors, most recent 04/09/2017. FINDINGS: Brain: Patient is status post RIGHT craniotomy and evacuation of subdural. Subdural drain has been left in place. There is decreased residual extra-axial collection on the RIGHT, mixed attenuation, with some superimposed pneumocephalus, measuring up to 10 mm thick on image 16 series 3. Largest residual collection is over the frontal lobe. Tentorial collection on the RIGHT redemonstrated and stable. Small extra-axial hygroma on the LEFT, stable. Stable parenchymal calcification, LEFT basal ganglia. No significant midline shift. LEFT ventricular catheter  unchanged in position. Vascular:   No signs of intracranial large vessel occlusion. Skull: Craniotomy/craniectomy defects, most notable posterior fossa midline. Acute RIGHT-sided operative site appears uncomplicated. Sinuses/Orbits: No sinus or mastoid disease. Other: None. IMPRESSION: Improved appearance status post RIGHT craniotomy for acute subdural evacuation. Residual 10 mm thick extra-axial collection over the RIGHT convexity, with a subdural drain in place along with pneumocephalus. Continued surveillance is warranted. Electronically Signed   By: Staci Righter M.D.   On: 04/10/2017 11:51   Portable Chest Xray  Result Date: 04/11/2017 CLINICAL DATA:  Subdural hematoma, low-grade fever. EXAM: PORTABLE CHEST 1 VIEW COMPARISON:  04/10/2017. FINDINGS: Normal heart size. Clear lung fields. ET tube 3.9 cm above carina. Orogastric tube coiled in stomach. Ventriculoperitoneal shunt catheter courses along the LEFT chest. IMPRESSION: Stable chest. No pulmonary consolidation or significant atelectasis. Electronically Signed   By: Staci Righter M.D.   On: 04/11/2017 08:54   Dg Abd Portable 1v  Result Date: 04/10/2017 CLINICAL DATA:  Status post orogastric tube placement EXAM: PORTABLE ABDOMEN - 1 VIEW COMPARISON:  None in PACs FINDINGS: The orogastric tube tip and proximal port lie in the gastric body. The bowel gas pattern does not suggest obstruction. IMPRESSION: Reasonable positioning of the orogastric tube with both proximal port and tip lie in the gastric body. Electronically Signed   By: David  Martinique M.D.   On: 04/10/2017 12:21    Assessment/Plan:   LOS: 3 days  Continue support.   Verdis Prime 04/12/2017, 8:11 AM  Patient is sedated.  No status on EEG as of this morning.

## 2017-04-12 NOTE — Progress Notes (Signed)
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  Apparently fell 2 weeks ago hitting his head.  Complained of a headache to his mother on the morning of 3/24 while at church.  Additionally, he was recently changed from depakote to [place] due to hallucinations by Dr. Krista Blue who treats him for seizures.   There was concern for sepsis with initial presentation of low-grade fever and elevated WBC at [facility] and was treated with rocephin.  CT head showed a large subdural hematoma on the right.  He was transferred to [facility] ED, lethargic with temperature of 102.8, unknown baseline mental status, admitted to Neurosurgery and taken to OR for craniotomy and hematoma evacuation.  He was given Novoseven in OR as patient had been taking arixta.  ENT consulted in OR due to anesthesia team having difficulty passing ETT past glottis with inspiratory stridor, however remained easy to ventilate with bag/mask ventilation where a 7.0 ETT was placed using a Storz laryngoscope. Patient returns to ICU post-operatively and PCCM consulted for ventilator management and medical management.   Has been no overt seizure activity overnight and at the request of neurology his propofol was held this morning.  He is showing some spontaneous eye opening for me but is not at all interactive.     PAST MEDICAL HISTORY :   He  has a past medical history of Brain cancer ([place]), Hypercholesteremia, Seizure ([place]), Stroke ([place]), and Thyroid disorder.  PAST SURGICAL HISTORY: He  has a past surgical history that includes Brain surgery; Appendectomy; Craniotomy (Right, 04/09/2017); Direct laryngoscopy (N/A, 04/09/2017); and Direct laryngoscopy (N/A, 04/09/2017).  Allergies  Allergen Reactions  . Heparin Anaphylaxis    Patient has HITT  . Lidocaine Hives  . Phenytoin Sodium Extended Rash    No current facility-administered medications on file prior to encounter.    Current Outpatient Medications on File Prior to Encounter  Medication Sig  . atorvastatin (LIPITOR) 20 MG tablet Take 20 mg by mouth daily.  . divalproex (DEPAKOTE) 250 MG DR tablet Take 250-500 mg by mouth 2 (two) times daily. 250mg  in the morning and 500mg  at bedtime  . FLUoxetine (PROZAC) 20 MG capsule Take by mouth daily.  . fondaparinux (ARIXTRA) 2.5 MG/0.5ML SOLN injection INJECT 2.5 MG SUBCUTANEOUSLY DAILY  . levETIRAcetam (KEPPRA) 750 MG tablet Take 1 tablet (750 mg total) by mouth every 12 (twelve) hours.  Marland Kitchen levothyroxine (SYNTHROID, LEVOTHROID) 75 MCG tablet Take 75 mcg by mouth daily.  Marland Kitchen loratadine (CLARITIN) 10 MG tablet Take 10 mg by mouth daily.    FAMILY HISTORY:  His is adopted.    SOCIAL HISTORY: He  reports that he has never smoked. He has never used smokeless tobacco. He reports that he does not drink alcohol or use drugs.  REVIEW OF SYSTEMS:   Not obtainable  SUBJECTIVE:  Not obtainable  VITAL SIGNS: BP 107/71   Pulse 81   Temp 98.2 F (36.8 C)   Resp 14   Ht 5\' 10"  (1.778  m)   Wt 220 lb (99.8 kg)   SpO2 100%   BMI 31.57 kg/m   HEMODYNAMICS:    VENTILATOR SETTINGS: Vent Mode: PRVC FiO2 (%):  [40 %] 40 % Set Rate:  [12 bmp-16 bmp] 12 bmp Vt Set:  [580 mL] 580 mL PEEP:  [5 cmH20] 5 cmH20 Plateau Pressure:  [9 cmH20-18 cmH20] 13 cmH20  INTAKE / OUTPUT: I/O last 3 completed shifts: In: 792.4 [I.V.:647.4;  Other:20; NG/GT:80; IV Piggyback:45] Out: 9485 [Urine:1815; Drains:29]  PHYSICAL EXAMINATION: General: Young male orally intubated and mechanically ventilated in no overt distress Neuro: He is beginning to show some spontaneous eye opening and some spontaneous movement of the left limbs but he is not at all interactive.  Pupils are equal. Cardiovascular: S1 and S2 are regular without murmur rub or gallop. Lungs: He is unlabored, he is not breathing above the set ventilator rate, there is symmetric air movement no wheezes, no rhonchi. Abdomen: The abdomen is soft without overt organomegaly masses or tenderness. Musculoskeletal: No dependent edema  LABS:  BMET Recent Labs  Lab 04/10/17 0549 04/11/17 1424 04/12/17 0437  NA 126* 129* 130*  K 3.8 3.5 3.2*  CL 98* 101 100*  CO2 18* 18* 18*  BUN 13 11 8   CREATININE 1.06 0.85 0.72  GLUCOSE 147* 104* 94    Electrolytes Recent Labs  Lab 04/10/17 0549 04/11/17 1424 04/12/17 0437  CALCIUM 8.3* 7.9* 8.2*  MG 1.7  --  2.1  PHOS  --   --  2.5    CBC Recent Labs  Lab 04/10/17 0549 04/11/17 1413 04/12/17 0437  WBC 14.9* 11.4* 10.1  HGB 13.3 10.8* 10.8*  HCT 37.0* 32.1* 32.0*  PLT 296 187 188    Coag's Recent Labs  Lab 04/10/17 0549  INR 0.74    Sepsis Markers Recent Labs  Lab 04/10/17 0549  LATICACIDVEN 1.4  PROCALCITON <0.10    ABG Recent Labs  Lab 04/10/17 0120 04/10/17 0408 04/11/17 1530  PHART 7.384 7.409 7.527*  PCO2ART 37.1 36.2 27.2*  PO2ART 144.0* 469.0* 186.0*    Liver Enzymes Recent Labs  Lab 04/10/17 0549  AST 58*  ALT 12*  ALKPHOS 53  BILITOT 1.5*  ALBUMIN 3.1*    Cardiac Enzymes No results for input(s): TROPONINI, PROBNP in the last 168 hours.  Glucose Recent Labs  Lab 04/11/17 1550 04/11/17 1726 04/11/17 2012 04/11/17 2342 04/12/17 0353 04/12/17 0816  GLUCAP 70 76 83 80 110* 95    Imaging Dg Chest Port 1 View  Result Date: 04/12/2017 CLINICAL DATA:  Respiratory  failure. History of stroke and seizure. Brain cancer. EXAM: PORTABLE CHEST 1 VIEW COMPARISON:  04/11/2017 FINDINGS: Endotracheal tube tip is 3 cm above the carina. Nasogastric tube has its tip in the stomach. The lungs are clear and well aerated. VP shunt tube overlies the left chest. IMPRESSION: Endotracheal tube well position.  Lungs remain clear. Electronically Signed   By: Nelson Chimes M.D.   On: 04/12/2017 09:44      DISCUSSION:      This is a 47 year old with a prior DNS tumor followed by a medulloblastoma with VP shunt placement who presented with altered mental status and a subdural hematoma.  The subdural was evacuated and he was subsequently suspected of having seizure activity needs transfer to ICU for control of seizures.  Of note he was a difficult intubation.  ASSESSMENT / PLAN:  PULMONARY A: He remains intubated solely due to mental status changes.  CARDIOVASCULAR A: No issues  with hemodynamic instability at present   RENAL A: He is hyponatremic.  I have ordered a cortisol serum osmolality and urine osmolality as first steps in evaluating the etiology.  For now we will continue him on normal saline as his sodium is coming up on that agent.  TSH is actually marginally low, and free T4 low normal.  I wonder if he does not have some pituitary dysfunction as the etiology of his hypothyroidism and have ordered a serum cortisol to rule out secondary hypoadrenalism.   GASTROINTESTINAL A: Prophylaxis is with Protonix.     HEMATOLOGIC A: He is on no chemical prophylaxis for DVTs due to his CNS surgery, he continues SCDs.   INFECTIOUS A:   Currently no active issues    ENDOCRINE A: see above     NEUROLOGIC A: Anticonvulsants per neurology.  Ensuring that his electrolytes are corrected to make sure that he does not have a metabolic substrate for ongoing seizures.      Lars Masson, MD  Critical Care Medicine [facility] Pager: [phone]  04/12/2017, 10:52  AM

## 2017-04-12 NOTE — Progress Notes (Signed)
Nutrition Follow-up  INTERVENTION:   Vital AF 1.2 @ 60 ml/hr (1440 ml/day) Provides: 1828 kcal, 123 grams protein, and 1167 ml free water.   NUTRITION DIAGNOSIS:   Inadequate oral intake related to inability to eat as evidenced by NPO status. Ongoing  GOAL:   Patient will meet greater than or equal to 90% of their needs Progressing  MONITOR:   TF tolerance, Labs  REASON FOR ASSESSMENT:   Consult Enteral/tube feeding initiation and management  ASSESSMENT:   Pt with PMH of glioblastoma s/p surgical removal 1993, autoimmune cerebritis, seizures, CVA, HIT who was admitted 3/24 from [facility] with increased confusion x 2 days PTA. He fell and hit his head 2 weeks PTA. Tx to [facility], CT shows large R SDH now s/p crani with JP drain placement 3/25.     Pt discussed during ICU rounds and with RN.  3/26 started having seizures, remains intubated  3/27 Off propofol and being monitored via continuous EEG  Patient is currently intubated on ventilator support MV: 6.7 L/min Temp (24hrs), Avg:98.8 F (37.1 C), Min:95.7 F (35.4 C), Max:100.9 F (38.3 C) JP drain: 19 ml out x 24 hr  Propofol: off Medications reviewed and include: NS with 20 mEq KCl/L @ 75 ml/hr Labs reviewed: Na 130 (L), K+ 3.2 MAP: 91 - no pressors  OG tube with tip in gastric body    Diet Order:  Diet NPO time specified  EDUCATION NEEDS:   No education needs have been identified at this time  Skin:  Skin Assessment: (head incision)  Last BM:  unknown  Height:   Ht Readings from Last 1 Encounters:  04/10/17 5\' 10"  (1.778 m)    Weight:   Wt Readings from Last 1 Encounters:  04/09/17 220 lb (99.8 kg)  Weight 02/13/17 was 201 lb (91.2 kg)  Ideal Body Weight:  75.4 kg  BMI:  Body mass index is 31.57 kg/m.  Estimated Nutritional Needs:   Kcal:  8115  Protein:  115-130 grams  Fluid:  > 2 L/day  Maylon Peppers RD, LDN, CNSC [phone] Pager [phone] After Hours Pager

## 2017-04-12 NOTE — Progress Notes (Signed)
E-link MD made aware of patient's blood pressure. Was told MAP of greater than 60 is okay. Will continue to monitor.

## 2017-04-12 NOTE — Procedures (Signed)
Name:   Locke, Barrell MRN:    [id number]  Study Duration: 04/11/2017 09:47 to 04/12/2017 07:30 CPT Code:  03013 Diagnosis:  Altered mental status (R41.82)  History: This is a 47 year old male presenting with altered mental status in the setting of right-sided subdural hematoma.  Video-EEG monitoring was performed to evaluate for seizures.  Technical Details:  Long-term video-EEG monitoring was performed using standard setting per the guidelines.  Briefly, a minimum of 21 electrodes were placed on scalp according to the International 10-20 or/and 10-10 Systems.  Supplemental electrodes were placed as needed.  Single EKG electrode was also used to detect cardiac arrhythmia.  Patient's behavior was continuously recorded on video simultaneously with EEG.  A minimum of 16 channels were used for data display.  Each epoch of study was reviewed manually daily and as needed using standard referential and bipolar montages.    EEG Description:  There was generalized polymorphic delta slowing, more prominent over the right hemisphere.  Diffuse beta activity was present, most likely due to medication effect (propofol).  No posterior dominant rhythm or sleep architecture was recorded.  No epileptiform discharges or seizures were in evidence.  Impression:  This is an abnormal EEG due to the presence of generalized polymorphic delta slowing, suggesting severe encephalopathy, but medication effect (propofol) could not be excluded.  No epileptiform discharges or seizures were in evidence.    Reading Physician: Winfield Cunas, MD, PhD

## 2017-04-13 ENCOUNTER — Inpatient Hospital Stay (HOSPITAL_COMMUNITY): Payer: Medicare Other

## 2017-04-13 LAB — GLUCOSE, CAPILLARY
GLUCOSE-CAPILLARY: 112 mg/dL — AB (ref 65–99)
GLUCOSE-CAPILLARY: 89 mg/dL (ref 65–99)
GLUCOSE-CAPILLARY: 90 mg/dL (ref 65–99)
Glucose-Capillary: 112 mg/dL — ABNORMAL HIGH (ref 65–99)
Glucose-Capillary: 96 mg/dL (ref 65–99)

## 2017-04-13 LAB — BASIC METABOLIC PANEL
ANION GAP: 6 (ref 5–15)
BUN: 9 mg/dL (ref 6–20)
CO2: 24 mmol/L (ref 22–32)
Calcium: 8.1 mg/dL — ABNORMAL LOW (ref 8.9–10.3)
Chloride: 106 mmol/L (ref 101–111)
Creatinine, Ser: 0.81 mg/dL (ref 0.61–1.24)
GLUCOSE: 115 mg/dL — AB (ref 65–99)
POTASSIUM: 3.1 mmol/L — AB (ref 3.5–5.1)
SODIUM: 136 mmol/L (ref 135–145)

## 2017-04-13 LAB — CBC WITH DIFFERENTIAL/PLATELET
BASOS ABS: 0 10*3/uL (ref 0.0–0.1)
Basophils Relative: 0 %
EOS PCT: 3 %
Eosinophils Absolute: 0.2 10*3/uL (ref 0.0–0.7)
HCT: 31.1 % — ABNORMAL LOW (ref 39.0–52.0)
Hemoglobin: 10.1 g/dL — ABNORMAL LOW (ref 13.0–17.0)
LYMPHS ABS: 1.4 10*3/uL (ref 0.7–4.0)
LYMPHS PCT: 21 %
MCH: 28.4 pg (ref 26.0–34.0)
MCHC: 32.5 g/dL (ref 30.0–36.0)
MCV: 87.4 fL (ref 78.0–100.0)
MONO ABS: 0.5 10*3/uL (ref 0.1–1.0)
Monocytes Relative: 8 %
Neutro Abs: 4.5 10*3/uL (ref 1.7–7.7)
Neutrophils Relative %: 68 %
PLATELETS: 185 10*3/uL (ref 150–400)
RBC: 3.56 MIL/uL — ABNORMAL LOW (ref 4.22–5.81)
RDW: 13.6 % (ref 11.5–15.5)
WBC: 6.7 10*3/uL (ref 4.0–10.5)

## 2017-04-13 LAB — POCT I-STAT 3, ART BLOOD GAS (G3+)
Acid-Base Excess: 3 mmol/L — ABNORMAL HIGH (ref 0.0–2.0)
Bicarbonate: 28.8 mmol/L — ABNORMAL HIGH (ref 20.0–28.0)
O2 SAT: 100 %
Patient temperature: 98
TCO2: 30 mmol/L (ref 22–32)
pCO2 arterial: 45.3 mmHg (ref 32.0–48.0)
pH, Arterial: 7.41 (ref 7.350–7.450)
pO2, Arterial: 416 mmHg — ABNORMAL HIGH (ref 83.0–108.0)

## 2017-04-13 LAB — PHOSPHORUS
PHOSPHORUS: 3.6 mg/dL (ref 2.5–4.6)
Phosphorus: 3 mg/dL (ref 2.5–4.6)

## 2017-04-13 LAB — TRIGLYCERIDES
TRIGLYCERIDES: 153 mg/dL — AB (ref <150)
TRIGLYCERIDES: 73 mg/dL (ref <150)

## 2017-04-13 LAB — MAGNESIUM
MAGNESIUM: 1.5 mg/dL — AB (ref 1.7–2.4)
Magnesium: 1.9 mg/dL (ref 1.7–2.4)

## 2017-04-13 MED ORDER — RACEPINEPHRINE HCL 2.25 % IN NEBU
0.5000 mL | INHALATION_SOLUTION | RESPIRATORY_TRACT | Status: AC
  Administered 2017-04-13 (×3): 0.5 mL via RESPIRATORY_TRACT
  Filled 2017-04-13 (×3): qty 0.5

## 2017-04-13 MED ORDER — DEXAMETHASONE SODIUM PHOSPHATE 10 MG/ML IJ SOLN
6.0000 mg | Freq: Four times a day (QID) | INTRAMUSCULAR | Status: DC
Start: 2017-04-13 — End: 2017-04-18
  Administered 2017-04-13 – 2017-04-18 (×19): 6 mg via INTRAVENOUS
  Filled 2017-04-13 (×18): qty 1

## 2017-04-13 MED ORDER — PROPOFOL 1000 MG/100ML IV EMUL
0.0000 ug/kg/min | INTRAVENOUS | Status: DC
  Administered 2017-04-13 – 2017-04-15 (×5): 10 ug/kg/min via INTRAVENOUS
  Filled 2017-04-13 (×5): qty 100

## 2017-04-13 MED ORDER — CHLORHEXIDINE GLUCONATE 0.12% ORAL RINSE (MEDLINE KIT)
15.0000 mL | Freq: Two times a day (BID) | OROMUCOSAL | Status: DC
  Administered 2017-04-13 – 2017-04-18 (×10): 15 mL via OROMUCOSAL

## 2017-04-13 MED ORDER — ORAL CARE MOUTH RINSE: 15.0000 mL | Freq: Two times a day (BID) | OROMUCOSAL | Status: DC

## 2017-04-13 MED ORDER — PROPOFOL 500 MG/50ML IV EMUL
INTRAVENOUS | Status: AC
  Administered 2017-04-13: 20 ug/kg/min
  Filled 2017-04-13: qty 50

## 2017-04-13 MED ORDER — CHLORHEXIDINE GLUCONATE 0.12 % MT SOLN: 15.0000 mL | Freq: Two times a day (BID) | OROMUCOSAL | Status: DC

## 2017-04-13 MED ORDER — ORAL CARE MOUTH RINSE
15.0000 mL | OROMUCOSAL | Status: DC
  Administered 2017-04-13 – 2017-04-18 (×46): 15 mL via OROMUCOSAL

## 2017-04-13 MED ORDER — PROPOFOL 500 MG/50ML IV EMUL
INTRAVENOUS | Status: AC
  Administered 2017-04-13: 20:00:00
  Filled 2017-04-13: qty 50

## 2017-04-13 NOTE — Progress Notes (Signed)
Reason for consult:   Subjective: Awake, tracks, does not follow commands. No clinical seizures noted   ROS:  Unable to obtain due to poor mental status  Examination  Vital signs in last 24 hours: Temp:  [97 F (36.1 C)-98.8 F (37.1 C)] 98.1 F (36.7 C) (03/28 1130) Pulse Rate:  [65-107] 99 (03/28 1809) Resp:  [12-32] 28 (03/28 1730) BP: (73-173)/(51-130) 165/87 (03/28 1809) SpO2:  [96 %-100 %] 100 % (03/28 1809) Arterial Line BP: (63-152)/(42-82) 151/75 (03/28 1730) FiO2 (%):  [30 %-100 %] 100 % (03/28 1809) Weight:  [90.5 kg (199 lb 8.3 oz)] 90.5 kg (199 lb 8.3 oz) (03/28 0415)  General: Not in distress, cooperative CVS: pulse-normal rate and rhythm RS: breathing comfortably Extremities: normal   Neuro: MS: Alert, tracks but not following commands,  CN: pupils equal and reactive,  EOMI, face symmetric, tongue midline, normal sensation over face, Motor: moves all 4 extremities Gait: not tested  Basic Metabolic Panel: Recent Labs  Lab 04/10/17 0120  04/10/17 0549 04/11/17 1424 04/12/17 0437 04/12/17 1704 04/13/17 0157 04/13/17 0821  NA 128*  --  126* 129* 130*  --   --  136  K 4.1  --  3.8 3.5 3.2*  --   --  3.1*  CL  --   --  98* 101 100*  --   --  106  CO2  --   --  18* 18* 18*  --   --  24  GLUCOSE  --   --  147* 104* 94  --   --  115*  BUN  --   --  13 11 8   --   --  9  CREATININE  --   --  1.06 0.85 0.72  --   --  0.81  CALCIUM  --    < > 8.3* 7.9* 8.2*  --   --  8.1*  MG  --   --  1.7  --  2.1 1.9 1.9  --   PHOS  --   --   --   --  2.5 2.9 3.0  --    < > = values in this interval not displayed.    CBC: Recent Labs  Lab 04/10/17 0120 04/10/17 0549 04/11/17 1413 04/12/17 0437 04/13/17 0821  WBC  --  14.9* 11.4* 10.1 6.7  NEUTROABS  --  13.0* 8.7*  --  4.5  HGB 13.6 13.3 10.8* 10.8* 10.1*  HCT 40.0 37.0* 32.1* 32.0* 31.1*  MCV  --  84.1 86.1 85.3 87.4  PLT  --  296 187 188 185     Coagulation Studies: No results for input(s): LABPROT, INR  in the last 72 hours.  Imaging Reviewed:     ASSESSMENT AND PLAN   47 yo M with a pmhx ofglioblastoma s/p surgical removal in 1993, autoimmue cerebritis, CVA,seizure disorder on keppra,and HITwho presented with AMS after a fall 2 weeks ago. CT head demonstrated a right subdural hematoma. He is now s/poperative drainage on 3/25 with craniotomy and JP drain placementper neurosurgery.Yesterday morning noted have seizure like activity concerning for status epilepticus. Was started on LTM EEG without further definite seizure activity.   Push button events for right side tremors not seizures on EEG. Discontinued LTM EEG monitoring yesterday  IMPRESSION: Possible Status epilepticus - resolved   Plan: -- Continue Keppra 1500 mg BID -- Seizure precautions -- Will sign off.       Arrian Manson [facility] Pager Number [phone] For questions  after 7pm please refer to AMION to reach the Neurologist on call

## 2017-04-13 NOTE — Procedures (Signed)
Extubation Procedure Note  Patient Details:   Name: Glen [name] DOB: 11/16/1970 MRN: [id number]   Airway Documentation:   Patient extubated per orders. Patient had positive cuff leak prior to extubation. Pt has strong cough and able to voice. Patient placed on 2lpm humidified oxygen. Nurse at bedside. Will continue to monitor.  Evaluation  O2 sats: stable throughout Complications: No apparent complications Patient did tolerate procedure well. Bilateral Breath Sounds: Rhonchi, Diminished   Yes  Ander Purpura 04/13/2017, 1:21 PM

## 2017-04-13 NOTE — Progress Notes (Signed)
RT NOTE:  ETT advanced 2cm following Chest XRAY per MD order. ETT secured @ 24 left/lip

## 2017-04-13 NOTE — Procedures (Signed)
Endotracheal intubation  Indication: Stridor post extubation refractory to racemic epinephrine and Decadron.  Procedure: The patient was examined he was having overt stridor and complaining of dyspnea.  He was preoxygenated with an Ambu bag mask then given 20 mg of etomidate followed by 50 mg of IV succinylcholine.  The cords were easily visualized with a Miller blade, the cords were swollen with a narrow glottic aperture.  He was intubated with a 6.5 endotracheal tube to 22 cm at the lip.  CO2 was positive and there was symmetric air movement.  Chest x-ray to confirm appropriate placement is pending

## 2017-04-13 NOTE — Progress Notes (Signed)
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  Apparently fell 2 weeks ago hitting his head.  Complained of a headache to his mother on the morning of 3/24 while at church.  Additionally, he was recently changed from depakote to [place] due to hallucinations by Dr. Krista Blue who treats him for seizures.   There was concern for sepsis with initial presentation of low-grade fever and elevated WBC at [facility] and was treated with rocephin.  CT head showed a large subdural hematoma on the right.  He was transferred to [facility] ED, lethargic with temperature of 102.8, unknown baseline mental status, admitted to Neurosurgery and taken to OR for craniotomy and hematoma evacuation.  He was given Novoseven in OR as patient had been taking arixta.  ENT consulted in OR due to anesthesia team having difficulty passing ETT past glottis with inspiratory stridor, however remained easy to ventilate with bag/mask ventilation where a 7.0 ETT was placed using a Storz laryngoscope. Patient returns to ICU post-operatively and PCCM consulted for ventilator management and medical management.   Is not had any seizure activity in the past 24 hours.  Fall was held prior to my examination and he is nodding to questions for me.  He denies dyspnea on a pressure support of 8.       PAST MEDICAL HISTORY :  He  has a  past medical history of Brain cancer ([place]), Hypercholesteremia, Seizure ([place]), Stroke ([place]), and Thyroid disorder.  PAST SURGICAL HISTORY: He  has a past surgical history that includes Brain surgery; Appendectomy; Craniotomy (Right, 04/09/2017); Direct laryngoscopy (N/A, 04/09/2017); and Direct laryngoscopy (N/A, 04/09/2017).  Allergies  Allergen Reactions  . Heparin Anaphylaxis    Patient has HITT  . Lidocaine Hives  . Phenytoin Sodium Extended Rash    No current facility-administered medications on file prior to encounter.    Current Outpatient Medications on File Prior to Encounter  Medication Sig  . atorvastatin (LIPITOR) 20 MG tablet Take 20 mg by mouth daily.  . divalproex (DEPAKOTE) 250 MG DR tablet Take 250-500 mg by mouth 2 (two) times daily. 250mg  in the morning and 500mg  at bedtime  . FLUoxetine (PROZAC) 20 MG capsule Take by mouth daily.  . fondaparinux (ARIXTRA) 2.5 MG/0.5ML SOLN injection INJECT 2.5 MG SUBCUTANEOUSLY DAILY  . levETIRAcetam (KEPPRA) 750 MG tablet Take 1 tablet (750 mg total) by mouth every 12 (twelve) hours.  Marland Kitchen levothyroxine (SYNTHROID, LEVOTHROID) 75 MCG tablet Take 75 mcg by mouth daily.  Marland Kitchen loratadine (CLARITIN) 10 MG tablet Take 10 mg by mouth daily.    FAMILY HISTORY:  His is adopted.    SOCIAL HISTORY: He  reports that he has never smoked. He has never used smokeless tobacco. He reports that he does not drink alcohol or use drugs.  REVIEW OF SYSTEMS:   Not obtainable  SUBJECTIVE:  Not obtainable  VITAL SIGNS: BP 124/74   Pulse 79   Temp 98.1 F (36.7 C)   Resp (!) 22  Ht 5\' 10"  (1.778 m)   Wt 199 lb 8.3 oz (90.5 kg)   SpO2 100%   BMI 28.63 kg/m   HEMODYNAMICS:    VENTILATOR SETTINGS: Vent Mode: PSV;CPAP FiO2 (%):  [30 %] 30 % Set Rate:  [12 bmp] 12 bmp Vt Set:  [580 mL] 580 mL PEEP:  [5 cmH20] 5 cmH20 Pressure Support:  [8 cmH20] 8 cmH20 Plateau Pressure:  [13 cmH20-16 cmH20] 13 cmH20  INTAKE / OUTPUT: I/O last 3  completed shifts: In: 2770 [P.O.:400; I.V.:1252; NG/GT:1118] Out: 3019 [Urine:3010; Drains:9]  PHYSICAL EXAMINATION: General: Young male orally intubated and mechanically ventilated in no overt distress Neuro: He has spontaneous eye opening and is intermittently nodding to questions.  Pupils are equal and reactive, he has bilateral horizontal nystagmus.   Pupils are equal. Cardiovascular: S1 and S2 are regular without murmur rub or gallop  Lungs: Respirations are unlabored on a pressure support of 8.  There is symmetric air movement and no wheezes.  There is good air movement throughout, no rhonchi  Abdomen: Abdomen is soft without organomegaly masses or tenderness.   Musculoskeletal: No dependent edema  LABS:  BMET Recent Labs  Lab 04/11/17 1424 04/12/17 0437 04/13/17 0821  NA 129* 130* 136  K 3.5 3.2* 3.1*  CL 101 100* 106  CO2 18* 18* 24  BUN 11 8 9   CREATININE 0.85 0.72 0.81  GLUCOSE 104* 94 115*    Electrolytes Recent Labs  Lab 04/11/17 1424 04/12/17 0437 04/12/17 1704 04/13/17 0157 04/13/17 0821  CALCIUM 7.9* 8.2*  --   --  8.1*  MG  --  2.1 1.9 1.9  --   PHOS  --  2.5 2.9 3.0  --     CBC Recent Labs  Lab 04/11/17 1413 04/12/17 0437 04/13/17 0821  WBC 11.4* 10.1 6.7  HGB 10.8* 10.8* 10.1*  HCT 32.1* 32.0* 31.1*  PLT 187 188 185    Coag's Recent Labs  Lab 04/10/17 0549  INR 0.74    Sepsis Markers Recent Labs  Lab 04/10/17 0549  LATICACIDVEN 1.4  PROCALCITON <0.10    ABG Recent Labs  Lab 04/10/17 0120 04/10/17 0408 04/11/17 1530  PHART 7.384 7.409 7.527*  PCO2ART 37.1 36.2 27.2*  PO2ART 144.0* 469.0* 186.0*    Liver Enzymes Recent Labs  Lab 04/10/17 0549  AST 58*  ALT 12*  ALKPHOS 53  BILITOT 1.5*  ALBUMIN 3.1*    Cardiac Enzymes No results for input(s): TROPONINI, PROBNP in the last 168 hours.  Glucose Recent Labs  Lab 04/12/17 1153 04/12/17 1557 04/12/17 2016 04/12/17 2340 04/13/17 0402 04/13/17 0833  GLUCAP  89 85 102* 115* 112* 112*    Imaging No results found.    DISCUSSION:      This is a 47 year old with a prior CNS tumor followed by a medulloblastoma with VP shunt placement who presented with altered mental status and a subdural hematoma.  The subdural was evacuated and he was subsequently suspected of having seizure activity with need  totransfer to ICU for control of seizures.  Of note he was a difficult intubation.  ASSESSMENT / PLAN:  PULMONARY A: He remains intubated solely due to mental status changes.  CARDIOVASCULAR A: No issues with hemodynamic instability at present   RENAL A: His hyponatremia has corrected with isotonic fluid administration.  His plasma cortisol was only 5.4 yesterday which is potentially normal in a nonstressed situation.  Should his hypo-natremia recur a cosyntropin stim test would be appropriate.  GASTROINTESTINAL A: Prophylaxis is with Protonix.     HEMATOLOGIC A: He is on no chemical prophylaxis for DVTs due to his CNS surgery, he continues SCDs.   INFECTIOUS A:   Currently no active issues    ENDOCRINE A: see above     NEUROLOGIC A: Anticonvulsants per neurology. ( Keppra 1500 BID )  Ensuring that his electrolytes are corrected to make sure that he does not have a metabolic substrate for ongoing seizures.      Lars Masson, MD  Critical Care Medicine [facility] Pager: [phone]  04/13/2017, 11:35 AM

## 2017-04-13 NOTE — Progress Notes (Addendum)
Patient ID: Glen [name], male   DOB: 1970-08-11, 47 y.o.   MRN: [id number] JP pulled on Dr. Melven Sartorius order, staple to site. Pt tolerated without evidence of pain. More awake now, attends, nods in response to some questions, but not following commands.

## 2017-04-13 NOTE — Progress Notes (Signed)
Racemic epinephrine given per orders for stridor. Patient set up on a 28% aerosol mask to help with airway swelling.

## 2017-04-13 NOTE — Progress Notes (Addendum)
Subjective: Patient reports (vent)  Objective: Vital signs in last 24 hours: Temp:  [97 F (36.1 C)-99 F (37.2 C)] 98.6 F (37 C) (03/28 0700) Pulse Rate:  [65-93] 81 (03/28 0700) Resp:  [12-16] 13 (03/28 0700) BP: (73-126)/(59-98) 101/62 (03/28 0700) SpO2:  [99 %-100 %] 99 % (03/28 0700) Arterial Line BP: (63-143)/(45-93) 97/48 (03/28 0700) FiO2 (%):  [30 %] 30 % (03/28 0352) Weight:  [90.5 kg (199 lb 8.3 oz)] 90.5 kg (199 lb 8.3 oz) (03/28 0415)  Intake/Output from previous day: 03/27 0701 - 03/28 0700 In: 2400 [P.O.:400; I.V.:882; NG/GT:1118] Out: 2050 [Urine:2050] Intake/Output this shift: No intake/output data recorded.  Off sedation. Vent continues after attempt to wean earlier. Eyelid flutter continues. Opens eyes widely on my request x1, but follows no other commands. JP without drainage last 24hr. Incision well-approximated without drainage.  Lab Results: Recent Labs    04/11/17 1413 04/12/17 0437  WBC 11.4* 10.1  HGB 10.8* 10.8*  HCT 32.1* 32.0*  PLT 187 188   BMET Recent Labs    04/11/17 1424 04/12/17 0437  NA 129* 130*  K 3.5 3.2*  CL 101 100*  CO2 18* 18*  GLUCOSE 104* 94  BUN 11 8  CREATININE 0.85 0.72  CALCIUM 7.9* 8.2*    Studies/Results: Dg Chest Port 1 View  Result Date: 04/12/2017 CLINICAL DATA:  Respiratory failure. History of stroke and seizure. Brain cancer. EXAM: PORTABLE CHEST 1 VIEW COMPARISON:  04/11/2017 FINDINGS: Endotracheal tube tip is 3 cm above the carina. Nasogastric tube has its tip in the stomach. The lungs are clear and well aerated. VP shunt tube overlies the left chest. IMPRESSION: Endotracheal tube well position.  Lungs remain clear. Electronically Signed   By: Nelson Chimes M.D.   On: 04/12/2017 09:44    Assessment/Plan:   LOS: 4 days  Continue support   Verdis Prime 04/13/2017, 7:48 AM   JP minimal output.  Discontinue today.

## 2017-04-14 ENCOUNTER — Inpatient Hospital Stay (HOSPITAL_COMMUNITY): Payer: Medicare Other

## 2017-04-14 LAB — GLUCOSE, CAPILLARY
GLUCOSE-CAPILLARY: 117 mg/dL — AB (ref 65–99)
GLUCOSE-CAPILLARY: 120 mg/dL — AB (ref 65–99)
GLUCOSE-CAPILLARY: 137 mg/dL — AB (ref 65–99)
GLUCOSE-CAPILLARY: 149 mg/dL — AB (ref 65–99)
Glucose-Capillary: 129 mg/dL — ABNORMAL HIGH (ref 65–99)
Glucose-Capillary: 124 mg/dL — ABNORMAL HIGH (ref 65–99)
Glucose-Capillary: 135 mg/dL — ABNORMAL HIGH (ref 65–99)

## 2017-04-14 LAB — CBC WITH DIFFERENTIAL/PLATELET
Basophils Absolute: 0 10*3/uL (ref 0.0–0.1)
Basophils Relative: 0 %
EOS PCT: 0 %
Eosinophils Absolute: 0 10*3/uL (ref 0.0–0.7)
HCT: 34.1 % — ABNORMAL LOW (ref 39.0–52.0)
Hemoglobin: 11.2 g/dL — ABNORMAL LOW (ref 13.0–17.0)
LYMPHS ABS: 0.9 10*3/uL (ref 0.7–4.0)
LYMPHS PCT: 11 %
MCH: 28.6 pg (ref 26.0–34.0)
MCHC: 32.8 g/dL (ref 30.0–36.0)
MCV: 87.2 fL (ref 78.0–100.0)
MONO ABS: 0.2 10*3/uL (ref 0.1–1.0)
Monocytes Relative: 2 %
Neutro Abs: 7.3 10*3/uL (ref 1.7–7.7)
Neutrophils Relative %: 87 %
PLATELETS: 280 10*3/uL (ref 150–400)
RBC: 3.91 MIL/uL — ABNORMAL LOW (ref 4.22–5.81)
RDW: 13.8 % (ref 11.5–15.5)
WBC: 8.4 10*3/uL (ref 4.0–10.5)

## 2017-04-14 LAB — BASIC METABOLIC PANEL
Anion gap: 8 (ref 5–15)
BUN: 10 mg/dL (ref 6–20)
CHLORIDE: 105 mmol/L (ref 101–111)
CO2: 23 mmol/L (ref 22–32)
Calcium: 8.7 mg/dL — ABNORMAL LOW (ref 8.9–10.3)
Creatinine, Ser: 0.73 mg/dL (ref 0.61–1.24)
GFR calc Af Amer: >60 mL/min (ref >60)
GLUCOSE: 137 mg/dL — AB (ref 65–99)
POTASSIUM: 3.8 mmol/L (ref 3.5–5.1)
Sodium: 136 mmol/L (ref 135–145)

## 2017-04-14 LAB — MAGNESIUM: Magnesium: 2 mg/dL (ref 1.7–2.4)

## 2017-04-14 LAB — PHOSPHORUS: Phosphorus: 4.2 mg/dL (ref 2.5–4.6)

## 2017-04-14 MED ORDER — VITAL AF 1.2 CAL PO LIQD
1000.0000 mL | ORAL | Status: DC
  Administered 2017-04-14 – 2017-04-17 (×6): 1000 mL
  Filled 2017-04-14 (×3): qty 1000

## 2017-04-14 MED ORDER — POLYETHYLENE GLYCOL 3350 17 G PO PACK
17.0000 g | PACK | Freq: Every day | ORAL | Status: DC
  Administered 2017-04-14 – 2017-04-24 (×6): 17 g via ORAL
  Filled 2017-04-14 (×7): qty 1

## 2017-04-14 MED ORDER — SENNOSIDES-DOCUSATE SODIUM 8.6-50 MG PO TABS
1.0000 | ORAL_TABLET | Freq: Every evening | ORAL | Status: DC | PRN
Start: 2017-04-14 — End: 2017-04-24

## 2017-04-14 NOTE — Progress Notes (Addendum)
Subjective: Patient reports (vent)  Objective: Vital signs in last 24 hours: Temp:  [97.9 F (36.6 C)-98.8 F (37.1 C)] 97.9 F (36.6 C) (03/29 0400) Pulse Rate:  [69-107] 71 (03/29 0700) Resp:  [12-32] 12 (03/29 0700) BP: (86-173)/(51-130) 120/76 (03/29 0700) SpO2:  [96 %-100 %] 100 % (03/29 0700) Arterial Line BP: (75-167)/(50-85) 153/76 (03/29 0700) FiO2 (%):  [30 %-100 %] 50 % (03/29 0407) Weight:  [83.9 kg (184 lb 15.5 oz)] 83.9 kg (184 lb 15.5 oz) (03/29 0446)  Intake/Output from previous day: 03/28 0701 - 03/29 0700 In: 1599.9 [I.V.:1239.9; NG/GT:360] Out: 1850 [Urine:1850] Intake/Output this shift: No intake/output data recorded.  Sedated - weaning; vent support. Opens eyes to voice but not following commands. Uneventful night per nursing. CT this am, Dr. [name] will review - stable.  Lab Results: Recent Labs    04/12/17 0437 04/13/17 0821  WBC 10.1 6.7  HGB 10.8* 10.1*  HCT 32.0* 31.1*  PLT 188 185   BMET Recent Labs    04/13/17 0821 04/14/17 0440  NA 136 136  K 3.1* 3.8  CL 106 105  CO2 24 23  GLUCOSE 115* 137*  BUN 9 10  CREATININE 0.81 0.73  CALCIUM 8.1* 8.7*    Studies/Results: Ct Head Wo Contrast  Result Date: 04/14/2017 CLINICAL DATA:  47 y/o  M; subdural hemorrhage for follow-up EXAM: CT HEAD WITHOUT CONTRAST TECHNIQUE: Contiguous axial images were obtained from the base of the skull through the vertex without intravenous contrast. COMPARISON:  04/10/2017 CT of the head. FINDINGS: Brain: Stable subdural hematoma present over the cerebral convexities bilaterally at along the right tentorium cerebelli with areas of retracted clot. Stable associated mass effect with sulcal effacement and minimal right-to-left midline shift. No new acute intracranial hemorrhage identified. Small foci of encephalomalacia are present within the left posteromedial frontal lobe, right parietal lobe, and throughout the posterior inferior cerebellum stable from the prior  study. No new region of acute brain parenchymal infarction or focal mass effect identified. Stable ventricle size. No herniation. Stable position of left frontal approach ventriculostomy catheter traversing frontal and third ventricles with tip in the suprasellar cistern. Vascular: Calcific atherosclerosis of carotid siphons. Skull: Stable postsurgical changes related to suboccipital craniotomy and left frontal bone burr hole placement. Stable chronic left frontal craniotomy changes. Mild diminution of scalp edema over the right convexity postsurgical changes related to craniotomy. Sinuses/Orbits: No acute finding. Other: None. IMPRESSION: 1. Stable right-greater-than-left convexity subdural hematoma, associated mass effect, and minimal right-to-left midline shift. 2. No new acute intracranial hemorrhage, stroke, or focal mass effect identified identified. 3. Stable areas of encephalomalacia in left posteromedial frontal lobe, right parietal lobe, and throughout posterior cerebellum. Electronically Signed   By: Kristine Garbe M.D.   On: 04/14/2017 05:30   Dg Chest Port 1 View  Result Date: 04/13/2017 CLINICAL DATA:  Intubation. EXAM: PORTABLE CHEST 1 VIEW COMPARISON:  Chest radiograph April 12, 2017 FINDINGS: Endotracheal tube tip projects above the clavicles, 7.1 cm above the carina. Nasogastric tube past mid stomach, distal tip projecting in distal stomach. Cardiomediastinal silhouette is normal. No pleural effusion or focal consolidation. No pneumothorax. Soft tissue planes and included osseous structures are unchanged. Ventriculoperitoneal shunt courses through LEFT chest. IMPRESSION: Retracted endotracheal tube tip projects 7.2 cm above the carina, recommend 2 cm advancement. Nasogastric tube tip projects distal stomach. No acute cardiopulmonary process. Electronically Signed   By: Elon Alas M.D.   On: 04/13/2017 18:55   Dg Abd Portable 1v  Result Date: 04/13/2017  CLINICAL DATA:   Orogastric tube placement. EXAM: PORTABLE ABDOMEN - 1 VIEW COMPARISON:  Abdominal radiograph April 10, 2017 FINDINGS: Nasogastric tube tip projects in distal stomach. Included bowel gas pattern nondilated and nonobstructive. Ventriculoperitoneal shunt LEFT abdomen. Soft tissue planes and included osseous structures are unchanged. IMPRESSION: Nasogastric tube tip projects in distal stomach. Electronically Signed   By: Elon Alas M.D.   On: 04/13/2017 18:56    Assessment/Plan:   LOS: 5 days  Continue support. Dr. [name] will review CT. CT stable on Dr. Melven Sartorius review.   Verdis Prime 04/14/2017, 7:32 AM  Head CT stable.  Continue current management.

## 2017-04-14 NOTE — Progress Notes (Signed)
PT Cancellation Note  Patient Details Name: Glen [name] MRN: [id number] DOB: 07-Dec-1970   Cancelled Treatment:    Reason Eval/Treat Not Completed: Patient not medically ready. Per CCM note, keeping him intubated and sedated today.  PT will continue to follow for medical stability to start mobilizing.    Thanks,    Barbarann Ehlers. [place], [place], DPT [phone]   04/14/2017, 2:51 PM

## 2017-04-14 NOTE — Progress Notes (Signed)
SLP Cancellation Note  Patient Details Name: Glen [name] MRN: [id number] DOB: Jan 18, 1970   Cancelled treatment:       Reason Eval/Treat Not Completed: Patient not medically ready   Odyssey Vasbinder, Katherene Ponto 04/14/2017, 8:22 AM

## 2017-04-14 NOTE — Progress Notes (Signed)
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  Apparently fell 2 weeks ago hitting his head.  Complained of a headache to his mother on the morning of 3/24 while at church.  Additionally, he was recently changed from depakote to Schlater due to hallucinations by Dr. Krista Blue who treats him for seizures.   There was concern for sepsis with initial presentation of low-grade fever and elevated WBC at [facility] and was treated with rocephin.  CT head showed a large subdural hematoma on the right.  He was transferred to [facility] ED, lethargic with temperature of 102.8, unknown baseline mental status, admitted to Neurosurgery and taken to OR for craniotomy and hematoma evacuation.  He was given Novoseven in OR as patient had been taking arixta.  ENT consulted in OR due to anesthesia team having difficulty passing ETT past glottis with inspiratory stridor, however remained easy to ventilate with bag/mask ventilation where a 7.0 ETT was placed using a Storz laryngoscope. Patient returns to ICU post-operatively and PCCM consulted for ventilator management and medical management.   Is not had any seizure activity in the past 24 hours.  Fall was held prior to my examination and he is nodding to questions for me.  He denies dyspnea on a pressure support of 8.     SUBJECTIVE:  Failed extubation yesterday  afternoon due to stridor that was refractory to racemic epi and decadron.  Had to use 6.5 ETT due to swollen cords. Repeat CT head today 3/29 stable.  VITAL SIGNS: BP 136/74   Pulse 65   Temp 98.4 F (36.9 C) (Axillary)   Resp 12   Ht 5\' 10"  (1.778 m)   Wt 83.9 kg (184 lb 15.5 oz)   SpO2 100%   BMI 26.54 kg/m   HEMODYNAMICS:    VENTILATOR SETTINGS: Vent Mode: PRVC FiO2 (%):  [30 %-100 %] 40 % Set Rate:  [12 bmp] 12 bmp Vt Set:  [580 mL] 580 mL PEEP:  [5 cmH20] 5 cmH20 Pressure Support:  [8 cmH20] 8 cmH20 Plateau Pressure:  [14 cmH20-18 cmH20] 15 cmH20  INTAKE / OUTPUT: I/O last 3 completed shifts: In: 3344.1 [P.O.:400; I.V.:1924.1; NG/GT:1020] Out: 2900 [Urine:2900]  PHYSICAL EXAMINATION: General: Young male orally intubated and mechanically ventilated in no overt distress Neuro: He has spontaneous eye opening and is intermittently nodding to questions.  Pupils are equal and reactive. He follows some very basic commands Cardiovascular: RRR, no M/R/G Lungs: CTAB, ETT in place Abdomen: Abdomen is soft without organomegaly masses or tenderness.   Musculoskeletal: No dependent edema  LABS:  BMET Recent Labs  Lab 04/12/17 0437 04/13/17 0821 04/14/17 0440  NA 130* 136 136  K 3.2* 3.1* 3.8  CL 100* 106 105  CO2 18* 24 23  BUN 8 9 10   CREATININE 0.72 0.81 0.73  GLUCOSE 94 115* 137*    Electrolytes Recent Labs  Lab 04/12/17 0437  04/13/17 0157 04/13/17 0821 04/13/17 1837 04/14/17 0440  CALCIUM 8.2*  --   --  8.1*  --  8.7*  MG 2.1   < > 1.9  --  1.5* 2.0  PHOS 2.5   < > 3.0  --  3.6 4.2   < > = values in this interval not displayed.    CBC Recent Labs  Lab 04/12/17 0437 04/13/17 0821 04/14/17 0440  WBC 10.1 6.7 8.4  HGB 10.8* 10.1* 11.2*  HCT 32.0* 31.1* 34.1*  PLT 188 185 280    Coag's Recent Labs  Lab 04/10/17 0549  INR 0.74    Sepsis Markers Recent Labs  Lab 04/10/17 0549  LATICACIDVEN 1.4  PROCALCITON <0.10    ABG Recent  Labs  Lab 04/10/17 0408 04/11/17 1530 04/13/17 2033  PHART 7.409 7.527* 7.410  PCO2ART 36.2 27.2* 45.3  PO2ART 469.0* 186.0* 416.0*    Liver Enzymes Recent Labs  Lab 04/10/17 0549  AST 58*  ALT 12*  ALKPHOS 53  BILITOT 1.5*  ALBUMIN 3.1*    Cardiac Enzymes No results for input(s): TROPONINI, PROBNP in the last 168 hours.  Glucose Recent Labs  Lab 04/13/17 1150 04/13/17 1553 04/13/17 1920 04/13/17 2342 04/14/17 0330 04/14/17 0736  GLUCAP 89 90 96 117* 129* 124*    Imaging Ct Head Wo Contrast  Result Date: 04/14/2017 CLINICAL DATA:  47 y/o  M; subdural hemorrhage for follow-up EXAM: CT HEAD WITHOUT CONTRAST TECHNIQUE: Contiguous axial images were obtained from the base of the skull through the vertex without intravenous contrast. COMPARISON:  04/10/2017 CT of the head. FINDINGS: Brain: Stable subdural hematoma present over the cerebral convexities bilaterally at along the right tentorium cerebelli with areas of retracted clot. Stable associated mass effect with sulcal effacement and minimal right-to-left midline shift. No new acute intracranial hemorrhage identified. Small foci of encephalomalacia are present within the left posteromedial frontal lobe, right parietal lobe, and throughout the posterior inferior cerebellum stable from the prior study. No new region of acute brain parenchymal infarction or focal mass effect identified. Stable ventricle size. No herniation. Stable position of left frontal approach ventriculostomy catheter traversing frontal and third ventricles with tip in the suprasellar cistern. Vascular: Calcific atherosclerosis of carotid siphons. Skull: Stable postsurgical changes related to suboccipital craniotomy and left frontal bone burr hole placement. Stable chronic left frontal craniotomy changes. Mild diminution of scalp edema over the right convexity postsurgical changes related to craniotomy. Sinuses/Orbits: No acute finding. Other: None. IMPRESSION:  1. Stable right-greater-than-left convexity subdural hematoma, associated mass effect, and minimal right-to-left midline shift. 2. No new acute intracranial hemorrhage, stroke, or focal mass effect identified identified. 3. Stable areas of encephalomalacia in left posteromedial frontal lobe, right parietal lobe, and throughout posterior cerebellum. Electronically Signed   By: Kristine Garbe M.D.   On: 04/14/2017 05:30   Dg Chest Port 1 View  Result Date: 04/13/2017 CLINICAL DATA:  Intubation. EXAM: PORTABLE CHEST 1 VIEW COMPARISON:  Chest radiograph April 12, 2017 FINDINGS: Endotracheal tube tip projects above the clavicles, 7.1 cm above the carina. Nasogastric tube past mid stomach, distal tip projecting in distal stomach. Cardiomediastinal silhouette is normal. No pleural effusion or focal consolidation. No pneumothorax. Soft tissue planes and included osseous structures are unchanged. Ventriculoperitoneal shunt courses through LEFT chest. IMPRESSION: Retracted endotracheal tube tip projects 7.2 cm above the carina, recommend 2 cm advancement. Nasogastric tube tip projects distal stomach. No acute cardiopulmonary process. Electronically Signed   By: Elon Alas M.D.   On: 04/13/2017 18:55   Dg Abd Portable 1v  Result Date: 04/13/2017 CLINICAL DATA:  Orogastric tube placement.  EXAM: PORTABLE ABDOMEN - 1 VIEW COMPARISON:  Abdominal radiograph April 10, 2017 FINDINGS: Nasogastric tube tip projects in distal stomach. Included bowel gas pattern nondilated and nonobstructive. Ventriculoperitoneal shunt LEFT abdomen. Soft tissue planes and included osseous structures are unchanged. IMPRESSION: Nasogastric tube tip projects in distal stomach. Electronically Signed   By: Elon Alas M.D.   On: 04/13/2017 18:56    DISCUSSION:      This is a 47 year old with a prior CNS tumor followed by a medulloblastoma with VP shunt placement who presented with altered mental status and a subdural  hematoma.  The subdural was evacuated and he was subsequently suspected of having seizure activity with need  totransfer to ICU for control of seizures.  Of note he was a difficult intubation. Extubation was attempted afternoon of 3/28 but unfortunately was not successful due to stridor that was refractory to racemic epi and decadron.  Required re-intubation and had to use 6.5 ETT due to edematous cords.  ASSESSMENT / PLAN:  PULMONARY A:  He remains intubated after he failed extubation yesterday (3/28) due to stridor requiring re-intubation Vocal cord edema P: Continue vent support Daily SBT but would be patient with attempting extubation again as likely needs several days for cord edema to improve / resolve Continue decadron (already on s/p brain surgery) Bronchial hygiene Follow CXR  CARDIOVASCULAR A:  No acute issues P: No interventions required  RENAL A: Hyponatremia - corrected with isotonic fluid administration.  His plasma cortisol was only 5.4 yesterday which is potentially normal in a nonstressed situation.   P: Continue NS Should his hyponatremia recur a cosyntropin stim test would be appropriate Follow BMP  GASTROINTESTINAL A:  Stress ulcer prophylaxis Nutrition P: Continue Protonix Continue TF's  HEMATOLOGIC A:  VTE prophylaxis P: He is on no chemical prophylaxis for DVTs due to his CNS surgery, he is solely on SCDs  ENDOCRINE A:  Hypothyroidism P: Continue synthroid  NEUROLOGIC A:  SDH s/p evacuation by neurosurgery (Dr. [name]) Seizures Sedation needs due to mechanical ventilation Hx CNS tumor followed by a medulloblastoma with VP shunt  P Post op care per neurosurgery Continue decadron per neurosurgery Continue Keppra 1500 BID per neurology (signed off). Ensure that his electrolytes are corrected to make sure that he does not have a metabolic substrate for ongoing seizures. Sedation: Propofol gtt / fentanyl PRN RASS goal: 0 to -1 Daily  WUA   CC time: 30 min.   Montey Hora, [place] [facility] Pager: [phone]  or [phone] 04/14/2017, 10:39 AM

## 2017-04-14 NOTE — Progress Notes (Signed)
RT NOTE:  Pt transported to CT and back to 4N26 without event.

## 2017-04-15 ENCOUNTER — Inpatient Hospital Stay (HOSPITAL_COMMUNITY): Payer: Medicare Other

## 2017-04-15 LAB — CBC
HEMATOCRIT: 40.7 % (ref 39.0–52.0)
HEMOGLOBIN: 13.5 g/dL (ref 13.0–17.0)
MCH: 29.8 pg (ref 26.0–34.0)
MCHC: 33.2 g/dL (ref 30.0–36.0)
MCV: 89.8 fL (ref 78.0–100.0)
Platelets: 352 10*3/uL (ref 150–400)
RBC: 4.53 MIL/uL (ref 4.22–5.81)
RDW: 13.9 % (ref 11.5–15.5)
WBC: 21.4 10*3/uL — ABNORMAL HIGH (ref 4.0–10.5)

## 2017-04-15 LAB — CULTURE, BLOOD (ROUTINE X 2)
CULTURE: NO GROWTH
CULTURE: NO GROWTH

## 2017-04-15 LAB — GLUCOSE, CAPILLARY
GLUCOSE-CAPILLARY: 119 mg/dL — AB (ref 65–99)
GLUCOSE-CAPILLARY: 125 mg/dL — AB (ref 65–99)
Glucose-Capillary: 150 mg/dL — ABNORMAL HIGH (ref 65–99)
Glucose-Capillary: 126 mg/dL — ABNORMAL HIGH (ref 65–99)

## 2017-04-15 LAB — BASIC METABOLIC PANEL
Anion gap: 11 (ref 5–15)
BUN: 13 mg/dL (ref 6–20)
CO2: 26 mmol/L (ref 22–32)
Calcium: 9.4 mg/dL (ref 8.9–10.3)
Chloride: 103 mmol/L (ref 101–111)
Creatinine, Ser: 0.87 mg/dL (ref 0.61–1.24)
GFR calc Af Amer: >60 mL/min (ref >60)
GLUCOSE: 130 mg/dL — AB (ref 65–99)
POTASSIUM: 3.9 mmol/L (ref 3.5–5.1)
Sodium: 140 mmol/L (ref 135–145)

## 2017-04-15 LAB — MAGNESIUM: Magnesium: 2.2 mg/dL (ref 1.7–2.4)

## 2017-04-15 LAB — PHOSPHORUS: PHOSPHORUS: 2.6 mg/dL (ref 2.5–4.6)

## 2017-04-15 MED ORDER — ALBUTEROL SULFATE (2.5 MG/3ML) 0.083% IN NEBU: 2.5000 mg | INHALATION_SOLUTION | RESPIRATORY_TRACT | Status: DC | PRN

## 2017-04-15 NOTE — Progress Notes (Signed)
SLP Cancellation Note  Patient Details Name: Glen [name] MRN: [id number] DOB: 12-22-1970   Cancelled treatment:       Reason Eval/Treat Not Completed: Patient not medically ready  Deneise Lever, [place], [place] Speech-Language Pathologist [phone]  Aliene Altes 04/15/2017, 1:19 PM

## 2017-04-15 NOTE — Progress Notes (Signed)
Subjective: The patient is alert and attentive.  He is in no apparent distress.  Objective: Vital signs in last 24 hours: Temp:  [97.9 F (36.6 C)-98.8 F (37.1 C)] 98.1 F (36.7 C) (03/30 0354) Pulse Rate:  [52-92] 63 (03/30 0700) Resp:  [0-24] 12 (03/30 0700) BP: (95-160)/(53-136) 110/68 (03/30 0700) SpO2:  [97 %-100 %] 100 % (03/30 0700) Arterial Line BP: (86-164)/(68-82) 164/71 (03/29 1600) FiO2 (%):  [40 %] 40 % (03/30 0326) Weight:  [84 kg (185 lb 3 oz)] 84 kg (185 lb 3 oz) (03/30 0433) Estimated body mass index is 26.57 kg/m as calculated from the following:   Height as of this encounter: 5\' 10"  (1.778 m).   Weight as of this encounter: 84 kg (185 lb 3 oz).   Intake/Output from previous day: 03/29 0701 - 03/30 0700 In: 2468.9 [I.V.:1326.9; NG/GT:1142] Out: 9811 [Urine:1735] Intake/Output this shift: No intake/output data recorded.  Physical exam Glasgow Coma Scale 10, intubated, [id number].  He localizes to pain bilaterally.  He does not follow commands.  His pupils are equal.  Lab Results: Recent Labs    04/13/17 0821 04/14/17 0440  WBC 6.7 8.4  HGB 10.1* 11.2*  HCT 31.1* 34.1*  PLT 185 280   BMET Recent Labs    04/13/17 0821 04/14/17 0440  NA 136 136  K 3.1* 3.8  CL 106 105  CO2 24 23  GLUCOSE 115* 137*  BUN 9 10  CREATININE 0.81 0.73  CALCIUM 8.1* 8.7*    Studies/Results: Ct Head Wo Contrast  Result Date: 04/14/2017 CLINICAL DATA:  47 y/o  M; subdural hemorrhage for follow-up EXAM: CT HEAD WITHOUT CONTRAST TECHNIQUE: Contiguous axial images were obtained from the base of the skull through the vertex without intravenous contrast. COMPARISON:  04/10/2017 CT of the head. FINDINGS: Brain: Stable subdural hematoma present over the cerebral convexities bilaterally at along the right tentorium cerebelli with areas of retracted clot. Stable associated mass effect with sulcal effacement and minimal right-to-left midline shift. No new acute intracranial  hemorrhage identified. Small foci of encephalomalacia are present within the left posteromedial frontal lobe, right parietal lobe, and throughout the posterior inferior cerebellum stable from the prior study. No new region of acute brain parenchymal infarction or focal mass effect identified. Stable ventricle size. No herniation. Stable position of left frontal approach ventriculostomy catheter traversing frontal and third ventricles with tip in the suprasellar cistern. Vascular: Calcific atherosclerosis of carotid siphons. Skull: Stable postsurgical changes related to suboccipital craniotomy and left frontal bone burr hole placement. Stable chronic left frontal craniotomy changes. Mild diminution of scalp edema over the right convexity postsurgical changes related to craniotomy. Sinuses/Orbits: No acute finding. Other: None. IMPRESSION: 1. Stable right-greater-than-left convexity subdural hematoma, associated mass effect, and minimal right-to-left midline shift. 2. No new acute intracranial hemorrhage, stroke, or focal mass effect identified identified. 3. Stable areas of encephalomalacia in left posteromedial frontal lobe, right parietal lobe, and throughout posterior cerebellum. Electronically Signed   By: Kristine Garbe M.D.   On: 04/14/2017 05:30   Dg Chest Port 1 View  Result Date: 04/13/2017 CLINICAL DATA:  Intubation. EXAM: PORTABLE CHEST 1 VIEW COMPARISON:  Chest radiograph April 12, 2017 FINDINGS: Endotracheal tube tip projects above the clavicles, 7.1 cm above the carina. Nasogastric tube past mid stomach, distal tip projecting in distal stomach. Cardiomediastinal silhouette is normal. No pleural effusion or focal consolidation. No pneumothorax. Soft tissue planes and included osseous structures are unchanged. Ventriculoperitoneal shunt courses through LEFT chest. IMPRESSION: Retracted endotracheal tube  tip projects 7.2 cm above the carina, recommend 2 cm advancement. Nasogastric tube tip  projects distal stomach. No acute cardiopulmonary process. Electronically Signed   By: Elon Alas M.D.   On: 04/13/2017 18:55   Dg Abd Portable 1v  Result Date: 04/13/2017 CLINICAL DATA:  Orogastric tube placement. EXAM: PORTABLE ABDOMEN - 1 VIEW COMPARISON:  Abdominal radiograph April 10, 2017 FINDINGS: Nasogastric tube tip projects in distal stomach. Included bowel gas pattern nondilated and nonobstructive. Ventriculoperitoneal shunt LEFT abdomen. Soft tissue planes and included osseous structures are unchanged. IMPRESSION: Nasogastric tube tip projects in distal stomach. Electronically Signed   By: Elon Alas M.D.   On: 04/13/2017 18:56    Assessment/Plan: Postop day #2: The patient's postoperative CT scan demonstrates a right subdural hematoma with mild mass-effect.  Clinically he is stable.  We will continue with supportive care.  LOS: 6 days     Ophelia Charter 04/15/2017, 8:11 AM

## 2017-04-15 NOTE — Progress Notes (Signed)
Prior to ambulating back to bed, noted ETT 20 at the lip. RT notified and advanced. Unable to advance to original location. Dr. Pearline Cables notified. Will follow up once the chest xray has been completed and resulted.

## 2017-04-15 NOTE — Evaluation (Signed)
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  Apparently fell 2 weeks ago hitting his head.   CT scan showed large SDH with minimal midline shift R greater than L side  Clinical Impression  Pt admitted with/for confusion, found to have SDH, s/p crani for evacuation.  Pt needing max assist of 2 persons for purposeful movement/activity.  Pt currently limited functionally due to the problems listed. ( See problems list.)   Pt will benefit from PT to maximize function and safety in order to get ready for next venue listed below.     Follow Up Recommendations CIR;Supervision/Assistance - 24 hour    Equipment Recommendations  None recommended by PT;Other (comment)(TBA next venue)    Recommendations for Other Services Rehab consult     Precautions / Restrictions Precautions Precautions: Fall      Mobility  Bed Mobility Overal bed mobility: Needs Assistance Bed Mobility: Rolling;Sidelying to Sit Rolling: Max assist Sidelying to sit: Max assist;+2 for physical assistance       General bed mobility comments: pt either a little resistant to movement or doesn't initiate movement to cues other than assistance for direction ot task.  Transfers Overall transfer level: Needs assistance Equipment used: 1 person hand held assist;2 person hand held assist Transfers: Sit to/from [facility] Sit to Stand: Max assist;+2  physical assistance Stand pivot transfers: Max assist;+2 physical assistance       General transfer comment: pt needing the physical direction to initiate directed movement.  Most of pt's movement were uncoordinated and needed to be assisted to accomplish the task.  Ambulation/Gait             General Gait Details: not tested  Stairs            Wheelchair Mobility    Modified Rankin (Stroke Patients Only) Modified Rankin (Stroke Patients Only) Pre-Morbid Rankin Score: Moderate disability Modified Rankin: Moderately severe disability     Balance Overall balance assessment: Needs assistance Sitting-balance support: Single extremity supported;No upper extremity supported Sitting balance-Leahy Scale: Fair Sitting balance - Comments: Pt with little initiation to move around in sitting   Standing balance support: Bilateral upper extremity supported;Single extremity supported Standing balance-Leahy Scale: Poor Standing balance comment: pt bearing weight , but "hanging" out on the therapists.                             Pertinent Vitals/Pain Pain Assessment: Faces Faces Pain Scale: No hurt    Home Living Family/patient expects to be discharged to:: Private residence Living Arrangements: Parent                    Prior Function Level of Independence: Needs assistance   Gait / Transfers Assistance Needed: per MOM pt walks with RW     Comments: interact with family, speaks.     Hand Dominance        Extremity/Trunk Assessment   Upper Extremity Assessment Upper Extremity Assessment: Defer to OT evaluation    Lower Extremity Assessment Lower Extremity Assessment: RLE  deficits/detail;LLE deficits/detail RLE Deficits / Details: moves LE spontaneously against gravity in bed.  bears full weight in standing.  movement is uncoordinated RLE Coordination: decreased fine motor LLE Deficits / Details: see R LE, L LE appears more uncoordinated and  lower tone than R LE LLE Coordination: decreased fine motor;decreased gross motor       Communication      Cognition Arousal/Alertness: Awake/alert Behavior During Therapy: Restless Overall Cognitive Status: Difficult to assess(unable to test formally)                                 General Comments: pt does not follow verbal commands      General Comments General comments (skin integrity, edema, etc.): pt's eyes open over 90% of the time.  Noted pt focusing in on therapist or tech when moving into his direct line of vision.  pt did not track to verbal or physical direction.    Exercises     Assessment/Plan    PT Assessment Patient needs continued PT services  PT Problem List Decreased strength;Decreased activity tolerance;Decreased mobility;Decreased balance;Decreased coordination;Impaired tone       PT Treatment Interventions DME instruction;Gait training;Functional mobility training;Therapeutic activities;Balance training;Neuromuscular re-education;Patient/family education    PT Goals (Current goals can be found in the Care Plan section)  Acute Rehab PT Goals Patient Stated Goal: pt unable to participate with goals PT Goal Formulation: Patient unable to participate in goal setting Time For Goal Achievement: 04/29/17 Potential to Achieve Goals: Good    Frequency Min 3X/week   Barriers to discharge        Co-evaluation               AM-PAC PT "6 Clicks" Daily Activity  Outcome Measure Difficulty turning over in bed (including adjusting bedclothes, sheets and blankets)?: Unable Difficulty moving from lying on back to sitting on the side of the bed? : Unable Difficulty sitting down on and standing up from a chair with arms (e.g., wheelchair, bedside commode, etc,.)?: Unable Help needed moving to and from a bed to chair (including a wheelchair)?: A Lot Help needed walking in hospital room?: A Lot Help needed climbing 3-5 steps with a railing? :  Total 6 Click Score: 8    End of Session   Activity Tolerance: Patient tolerated treatment well Patient left: in chair;with call bell/phone within reach;with chair alarm set;with restraints reapplied(lift pad) Nurse Communication: Mobility status PT Visit Diagnosis: Other abnormalities of gait and mobility (R26.89);Muscle weakness (generalized) (M62.81);Hemiplegia and hemiparesis Hemiplegia - caused by: Unspecified    Time: 1275-1700 PT Time Calculation (min) (ACUTE ONLY): 43 min   Charges:   PT Evaluation $PT Eval Moderate Complexity: 1 Mod PT Treatments $Therapeutic Activity: 8-22 mins $Neuromuscular Re-education: 8-22 mins   PT G Codes:        04/28/2017  Donnella Sham, PT [phone]  (pager)  Tessie Fass [place] 04/28/2017, 11:16 AM

## 2017-04-15 NOTE — Progress Notes (Signed)
Patient remained calm and stable throughout the night. Will continue to monitor

## 2017-04-15 NOTE — Progress Notes (Signed)
Earlier order was placed to advanced ETT 2cm.  Was able to advanced to 26 at the lip.  When patient was being moved from chair to bed, RN called to RT to say that ETT had slid out.  Upon arrival, ETT was noted to be at 19 at the lip.  Attempted to reinsert ETT back to 26 however unable to advance ETT.  ETT is currently sitting at 24 at the lip, while being connected to ETT holder, however once unconnected to try and advanced ETT slides out to 20-22.  Chest xray was ordered and gotten.  Results were called to MD by RN.  Another RT attempted to advance ETT back to 26 however still unsuccessful.  Patient ETT is still currently showing at 24 at the lip. Will continue to monitor.

## 2017-04-15 NOTE — Progress Notes (Signed)
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  Apparently fell 2 weeks ago hitting his head.  Complained of a headache to his mother on the morning of 3/24 while at church.  Additionally, he was recently changed from depakote to [place] due to hallucinations by Dr. Krista Blue who treats him for seizures.   There was concern for sepsis with initial presentation of low-grade fever and elevated WBC at [facility] and was treated with rocephin.  CT head showed a large subdural hematoma on the right.  He was transferred to [facility] ED, lethargic with temperature of 102.8, unknown baseline mental status, admitted to Neurosurgery and taken to OR for craniotomy and hematoma evacuation.  He was given Novoseven in OR as patient had been taking arixta.  ENT consulted in OR due to anesthesia team having difficulty passing ETT past glottis with inspiratory stridor, however remained easy to ventilate with bag/mask ventilation where a 7.0 ETT was placed using a Storz laryngoscope. Patient returns to ICU post-operatively and PCCM consulted for ventilator management and medical management.    SUBJECTIVE: Failed extubation on 3/28 due to stridor and remains intubated and mechanically ventilated.  I dropped the balloon on his 6.5 endotracheal tube today and he is unable to move any air around that small tube.  He is  alert and interactive for me today.   Repeat CT head today 3/29 stable.  VITAL SIGNS: BP 131/71   Pulse (!) 58   Temp 98.3 F (36.8 C) (Axillary)   Resp 15   Ht 5\' 10"  (1.778 m)   Wt 185 lb 3 oz (84 kg)   SpO2 100%   BMI 26.57 kg/m   HEMODYNAMICS:    VENTILATOR SETTINGS: Vent Mode: PSV;CPAP FiO2 (%):  [40 %] 40 % Set Rate:  [0.8 bmp-12 bmp] 12 bmp Vt Set:  [580 mL] 580 mL PEEP:  [5 cmH20] 5 cmH20 Pressure Support:  [10 cmH20] 10 cmH20 Plateau Pressure:  [13 cmH20-18 cmH20] 18 cmH20  INTAKE / OUTPUT: I/O last 3 completed shifts: In: 3122 [I.V.:1980; NG/GT:1142] Out: 3035 [Urine:3035]  PHYSICAL EXAMINATION: General: Intubated young male in no distress.   Neuro: He has spontaneous eye opening and is nodding to questions for me.  Pupils are equal and he is following instructions.   Lungs: He is unlabored on a pressure support of 8.  There is symmetric air movement, no wheezes there is good air movement bilaterally  Abdomen: Abdomen is soft without organomegaly masses or tenderness.   Musculoskeletal: No dependent edema  LABS:  BMET Recent Labs  Lab 04/13/17 0821 04/14/17 0440 04/15/17 0843  NA 136 136 140  K 3.1* 3.8 3.9  CL 106 105 103  CO2 24 23 26   BUN 9 10 13   CREATININE 0.81 0.73 0.87  GLUCOSE 115* 137* 130*    Electrolytes Recent Labs  Lab 04/13/17 0821 04/13/17 1837 04/14/17 0440 04/15/17 0843  CALCIUM 8.1*  --  8.7* 9.4  MG  --  1.5* 2.0  2.2  PHOS  --  3.6 4.2 2.6    CBC Recent Labs  Lab 04/13/17 0821 04/14/17 0440 04/15/17 0843  WBC 6.7 8.4 21.4*  HGB 10.1* 11.2* 13.5  HCT 31.1* 34.1* 40.7  PLT 185 280 352    Coag's Recent Labs  Lab 04/10/17 0549  INR 0.74    Sepsis Markers Recent Labs  Lab 04/10/17 0549  LATICACIDVEN 1.4  PROCALCITON <0.10    ABG Recent Labs  Lab 04/10/17 0408 04/11/17 1530 04/13/17 2033  PHART 7.409 7.527* 7.410  PCO2ART 36.2 27.2* 45.3  PO2ART 469.0* 186.0* 416.0*    Liver  Enzymes Recent Labs  Lab 04/10/17 0549  AST 58*  ALT 12*  ALKPHOS 53  BILITOT 1.5*  ALBUMIN 3.1*    Cardiac Enzymes No results for input(s): TROPONINI, PROBNP in the last 168 hours.  Glucose Recent Labs  Lab 04/14/17 0736 04/14/17 1119 04/14/17 1520 04/14/17 2037 04/14/17 2332 04/15/17 0716  GLUCAP 124* 120* 137* 135* 149* 150*    Imaging Dg Chest Port 1 View  Result Date: 04/15/2017 CLINICAL DATA:  Respiratory failure EXAM: PORTABLE CHEST 1 VIEW COMPARISON:  Two days ago FINDINGS: Endotracheal tube tip 2.4 cm above the carina, similar to yesterday. An orogastric tube tip is at the duodenum. Low volume chest but clear lungs. Normal heart size and mediastinal contours. Presumed VP shunt over the left chest. IMPRESSION: 1. High endotracheal tube, unchanged from yesterday, tip 2.4 cm above the clavicular heads. 2. Nasogastric tube tip at the duodenum. 3. Low volume chest without evidence of acute cardiopulmonary disease. Electronically Signed   By: Monte Fantasia M.D.   On: 04/15/2017 08:13    DISCUSSION:      This is a 47 year old with a prior CNS tumor followed by a medulloblastoma with VP shunt placement who presented with altered mental status and a subdural hematoma.  The subdural was evacuated and he was subsequently suspected of having seizure activity with need  totransfer to ICU for control of seizures.  Of note he was a difficult intubation. Extubation was attempted afternoon of 3/28 but unfortunately was not successful due to stridor that was refractory to racemic epi and decadron.  He required re-intubation and had to use 6.5 ETT due to edematous cords.  ASSESSMENT / PLAN:  PULMONARY A:  He remains intubated after he failed extubation yesterday (3/28) due to stridor requiring re-intubation Vocal cord edema P: Continue vent support I am continuing him on Decadron for now and will see if he can move any air around the 6.5 endotracheal tube tomorrow if not or if he  again fails extubation be consulting ENT for evaluation and likely tracheostomy   CARDIOVASCULAR A:  No acute issues P: No interventions required  RENAL A: Hyponatremia - corrected with isotonic fluid administration.  His plasma cortisol was only 5.4 yesterday which is potentially normal in a nonstressed situation.   P:  Directed on normal saline alone, we will not do any further evaluations as the provocation   GASTROINTESTINAL A:  Stress ulcer prophylaxis Nutrition P: Continue Protonix Continue TF's  HEMATOLOGIC A:  VTE prophylaxis P: He is on no chemical prophylaxis for DVTs due to his CNS surgery, he is solely on SCDs  ENDOCRINE A:  Hypothyroidism P: Continue synthroid  NEUROLOGIC A:  SDH s/p evacuation by neurosurgery (Dr. [name]) Seizures Sedation needs due to mechanical ventilation Hx CNS tumor followed by a medulloblastoma with VP shunt  P Post op care per neurosurgery Continue decadron per neurosurgery  Continue Keppra 1500 BID per neurology (signed off). Ensure that his electrolytes are corrected to make sure that he does not have a metabolic substrate for ongoing seizures. Sedation: Propofol gtt / fentanyl PRN RASS goal: 0 to -1 Daily Victoriano Lain, MD Critical Care Medicine Pager: [phone]  or [phone] 04/15/2017, 10:10 AM

## 2017-04-16 LAB — GLUCOSE, CAPILLARY
GLUCOSE-CAPILLARY: 131 mg/dL — AB (ref 65–99)
GLUCOSE-CAPILLARY: 136 mg/dL — AB (ref 65–99)
Glucose-Capillary: 142 mg/dL — ABNORMAL HIGH (ref 65–99)
Glucose-Capillary: 126 mg/dL — ABNORMAL HIGH (ref 65–99)
Glucose-Capillary: 131 mg/dL — ABNORMAL HIGH (ref 65–99)
Glucose-Capillary: 123 mg/dL — ABNORMAL HIGH (ref 65–99)

## 2017-04-16 MED ORDER — LORAZEPAM 2 MG/ML IJ SOLN
INTRAMUSCULAR | Status: AC
  Filled 2017-04-16: qty 1

## 2017-04-16 MED ORDER — MIDAZOLAM HCL 2 MG/2ML IJ SOLN
INTRAMUSCULAR | Status: AC
  Filled 2017-04-16: qty 2

## 2017-04-16 MED ORDER — MIDAZOLAM HCL 2 MG/2ML IJ SOLN
1.0000 mg | INTRAMUSCULAR | Status: DC | PRN
  Administered 2017-04-16 – 2017-04-19 (×10): 1 mg via INTRAVENOUS
  Filled 2017-04-16 (×9): qty 2

## 2017-04-16 NOTE — Progress Notes (Signed)
Patient remained calm and responsive throughout the night. VS were stable. Will continue to monitor.

## 2017-04-16 NOTE — Progress Notes (Signed)
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  Apparently fell 2 weeks ago hitting his head.  Complained of a headache to his mother on the morning of 3/24 while at church.  Additionally, he was recently changed from depakote to [place] due to hallucinations by Dr. Krista Blue who treats him for seizures.   There was concern for sepsis with initial presentation of low-grade fever and elevated WBC at [facility] and was treated with rocephin.  CT head showed a large subdural hematoma on the right.  He was transferred to [facility] ED, lethargic with temperature of 102.8, unknown baseline mental status, admitted to Neurosurgery and taken to OR for craniotomy and hematoma evacuation.  He was given Novoseven in OR as patient had been taking arixta.  ENT consulted in OR due to anesthesia team having difficulty passing ETT past glottis with inspiratory stridor, however remained easy to ventilate with bag/mask ventilation where a 7.0 ETT was placed using a Storz laryngoscope. Patient returns to ICU post-operatively and PCCM consulted for ventilator management and medical management.    SUBJECTIVE: Failed extubation on 3/28 due to stridor and remains intubated and mechanically ventilated.  He has not been able to move air around the 6.5 endotracheal tube nor have we been able to advance the tube which is  very high on the chest x-ray.  Considering that they encountered similar difficulties when the intubated him in the operating room I have asked ENT to evaluate the patient to determine whether he has a subglottic stenosis.  They  will be evaluating him on 4/1.  He continues to be awake and interactive and remarkably calm while intubated and mechanically ventilated.  He is tolerating a pressure support of 5 today which almost constitutes a stress test with a 6.5 tube in place  VITAL SIGNS: BP (!) 145/81   Pulse (!) 58   Temp 98.3 F (36.8 C) (Oral)   Resp 19   Ht 5\' 10"  (1.778 m)   Wt 191 lb 2.2 oz (86.7 kg)   SpO2 99%   BMI 27.43 kg/m   HEMODYNAMICS:    VENTILATOR SETTINGS: Vent Mode: PSV;CPAP FiO2 (%):  [40 %] 40 % Set Rate:  [12 bmp] 12 bmp Vt Set:  [580 mL] 580 mL PEEP:  [5 cmH20] 5 cmH20 Pressure Support:  [5 cmH20] 5 cmH20 Plateau Pressure:  [21 cmH20] 21 cmH20  INTAKE / OUTPUT: I/O last 3 completed shifts: In: 3948.2 [I.V.:1877.2; NG/GT:2071] Out: 2545 [Urine:2545]  PHYSICAL EXAMINATION: General: Intubated young male in no distress.   Neuro: Spontaneous eye opening and is nodding to questions for me.  Pupils are equal and he follows instructions. Lungs: He is unlabored on a pressure support of 5.  There is symmetric air movement no wheezes.  Abdomen: The abdomen is soft without organomegaly masses or tenderness    Musculoskeletal: No dependent edema  LABS:  BMET Recent Labs  Lab 04/13/17 0821 04/14/17 0440 04/15/17 0843  NA 136 136 140  K  3.1* 3.8 3.9  CL 106 105 103  CO2 24 23 26   BUN 9 10 13   CREATININE 0.81 0.73 0.87  GLUCOSE 115* 137* 130*    Electrolytes Recent Labs  Lab 04/13/17 0821 04/13/17 1837 04/14/17 0440 04/15/17 0843  CALCIUM 8.1*  --  8.7* 9.4  MG  --  1.5* 2.0 2.2  PHOS  --  3.6 4.2 2.6    CBC Recent Labs  Lab 04/13/17 0821 04/14/17 0440 04/15/17 0843  WBC 6.7 8.4 21.4*  HGB 10.1* 11.2* 13.5  HCT 31.1* 34.1* 40.7  PLT  185 280 352    Coag's Recent Labs  Lab 04/10/17 0549  INR 0.74    Sepsis Markers Recent Labs  Lab 04/10/17 0549  LATICACIDVEN 1.4  PROCALCITON <0.10    ABG Recent Labs  Lab 04/10/17 0408 04/11/17 1530 04/13/17 2033  PHART 7.409 7.527* 7.410  PCO2ART 36.2 27.2* 45.3  PO2ART 469.0* 186.0* 416.0*    Liver Enzymes Recent Labs  Lab 04/10/17 0549  AST 58*  ALT 12*  ALKPHOS 53  BILITOT 1.5*  ALBUMIN 3.1*    Cardiac Enzymes No results for input(s): TROPONINI, PROBNP in the last 168 hours.  Glucose Recent Labs  Lab 04/15/17 2035 04/16/17 0009 04/16/17 0407 04/16/17 0720 04/16/17 1212 04/16/17 1637  GLUCAP 125* 136* 142* 126* 131* 123*    Imaging Dg Chest Port 1 View  Result Date: 04/15/2017 CLINICAL DATA:  Endotracheal tube placement. EXAM: PORTABLE CHEST 1 VIEW COMPARISON:  Radiograph of same day. FINDINGS: The heart size and mediastinal contours are within normal limits. Both lungs are clear. Endotracheal tube tip is 7 cm above the carina. Nasogastric tube is seen entering the stomach. No pneumothorax or pleural effusion is noted. Left-sided ventriculoperitoneal shunt is noted. The visualized skeletal structures are unremarkable. IMPRESSION: Endotracheal and nasogastric tubes are unchanged in position. No acute cardiopulmonary abnormality seen. Electronically Signed   By: Marijo Conception, M.D.   On: 04/15/2017 20:22    DISCUSSION:      This is a 47 year old with a prior CNS tumor followed by a medulloblastoma with VP shunt placement who presented with altered mental status and a subdural hematoma.  The subdural was evacuated and he was subsequently suspected of having seizure activity with need  totransfer to ICU for control of seizures.  Of note he was a difficult intubation. Extubation was attempted afternoon of 3/28 but unfortunately was not successful due to stridor that was refractory to racemic epi and decadron.  He required re-intubation and had to use  6.5 ETT due to edematous cords.  There were similar difficulties with intubation in the operating room I have asked ENT to evaluate the patient for possible subglottic stenosis on 4/1.  ASSESSMENT / PLAN:  PULMONARY A:  He remains intubated after he failed extubation yesterday (3/28) due to stridor requiring re-intubation Vocal cord edema P: Continue vent support I am continuing him on Decadron for now.  As noted I have consulted ENT to evaluate his airway.  From a mechanics and gas exchange standpoint he could be extubated if he had a safe air passage.   CARDIOVASCULAR A:  No acute issues P: No interventions required  RENAL A: Hyponatremia - corrected with isotonic fluid administration.  His plasma cortisol was only 5.4 3/29 which is potentially normal in a nonstressed situation.     GASTROINTESTINAL A:  Stress ulcer prophylaxis Nutrition P: Continue Protonix Continue TF's  HEMATOLOGIC A:  VTE prophylaxis P: He is on no  chemical prophylaxis for DVTs due to his CNS surgery, he is solely on SCDs  ENDOCRINE A:  Hypothyroidism P: Continue synthroid  NEUROLOGIC A:  SDH s/p evacuation by neurosurgery (Dr. [name]) Seizures Sedation needs due to mechanical ventilation Hx CNS tumor followed by a medulloblastoma with VP shunt  P Post op care per neurosurgery Continue decadron per neurosurgery Continue Keppra 1500 BID per neurology (signed off). Ensure that his electrolytes are corrected to make sure that he does not have a metabolic substrate for ongoing seizures. Sedation: Propofol gtt / fentanyl PRN RASS goal: 0 to -1 Daily Victoriano Lain, MD Critical Care Medicine Pager: [phone]  or [phone] 04/16/2017, 4:42 PM

## 2017-04-16 NOTE — Progress Notes (Signed)
Subjective: The patient is alert and in no apparent distress.  Objective: Vital signs in last 24 hours: Temp:  [97.6 F (36.4 C)-98.3 F (36.8 C)] 97.6 F (36.4 C) (03/31 0717) Pulse Rate:  [46-72] 55 (03/31 0836) Resp:  [6-20] 18 (03/31 0836) BP: (97-155)/(58-89) 113/66 (03/31 0836) SpO2:  [100 %] 100 % (03/31 0836) FiO2 (%):  [40 %] 40 % (03/31 0836) Weight:  [86.7 kg (191 lb 2.2 oz)] 86.7 kg (191 lb 2.2 oz) (03/31 0500) Estimated body mass index is 27.43 kg/m as calculated from the following:   Height as of this encounter: 5\' 10"  (1.778 m).   Weight as of this encounter: 86.7 kg (191 lb 2.2 oz).   Intake/Output from previous day: 03/30 0701 - 03/31 0700 In: 2563.4 [I.V.:1212.4; NG/GT:1351] Out: 5462 [Urine:1775] Intake/Output this shift: No intake/output data recorded.  Physical exam Glascow coma scale 10 intubated.  He localizes.  He appears a phasic.  Lab Results: Recent Labs    04/14/17 0440 04/15/17 0843  WBC 8.4 21.4*  HGB 11.2* 13.5  HCT 34.1* 40.7  PLT 280 352   BMET Recent Labs    04/14/17 0440 04/15/17 0843  NA 136 140  K 3.8 3.9  CL 105 103  CO2 23 26  GLUCOSE 137* 130*  BUN 10 13  CREATININE 0.73 0.87  CALCIUM 8.7* 9.4    Studies/Results: Dg Chest Port 1 View  Result Date: 04/15/2017 CLINICAL DATA:  Endotracheal tube placement. EXAM: PORTABLE CHEST 1 VIEW COMPARISON:  Radiograph of same day. FINDINGS: The heart size and mediastinal contours are within normal limits. Both lungs are clear. Endotracheal tube tip is 7 cm above the carina. Nasogastric tube is seen entering the stomach. No pneumothorax or pleural effusion is noted. Left-sided ventriculoperitoneal shunt is noted. The visualized skeletal structures are unremarkable. IMPRESSION: Endotracheal and nasogastric tubes are unchanged in position. No acute cardiopulmonary abnormality seen. Electronically Signed   By: Marijo Conception, M.D.   On: 04/15/2017 20:22   Dg Chest Port 1 View  Result  Date: 04/15/2017 CLINICAL DATA:  Evaluate endotracheal tube. EXAM: PORTABLE CHEST 1 VIEW COMPARISON:  04/15/2017 and prior radiographs FINDINGS: An endotracheal tube is unchanged with tip approximately 7 cm above the carina. An endotracheal tube enters the stomach with tip off the field of view. Cardiomediastinal silhouette is stable. There is no evidence of focal airspace disease, pulmonary edema, suspicious pulmonary nodule/mass, pleural effusion, or pneumothorax. VP shunt catheter overlying the LEFT chest again noted. IMPRESSION: No acute abnormality.  Support apparatus as described. Electronically Signed   By: Margarette Canada M.D.   On: 04/15/2017 14:57   Dg Chest Port 1 View  Result Date: 04/15/2017 CLINICAL DATA:  Respiratory failure EXAM: PORTABLE CHEST 1 VIEW COMPARISON:  Two days ago FINDINGS: Endotracheal tube tip 2.4 cm above the carina, similar to yesterday. An orogastric tube tip is at the duodenum. Low volume chest but clear lungs. Normal heart size and mediastinal contours. Presumed VP shunt over the left chest. IMPRESSION: 1. High endotracheal tube, unchanged from yesterday, tip 2.4 cm above the clavicular heads. 2. Nasogastric tube tip at the duodenum. 3. Low volume chest without evidence of acute cardiopulmonary disease. Electronically Signed   By: Monte Fantasia M.D.   On: 04/15/2017 08:13    Assessment/Plan: Postop day #7: The patient is clinically stable.  LOS: 7 days     Ophelia Charter 04/16/2017, 9:04 AM

## 2017-04-16 NOTE — Progress Notes (Signed)
Rehab Admissions Coordinator Note:  Patient was screened by Retta Diones for appropriateness for an Inpatient Acute Rehab Consult.  Once patient is extubated, may benefit from rehab MD consult.    Jodell Cipro M 04/16/2017, 9:06 AM  I can be reached at [phone].

## 2017-04-16 NOTE — Progress Notes (Signed)
SLP Cancellation Note  Patient Details Name: Glen [name] MRN: [id number] DOB: December 06, 1970   Cancelled treatment:       Reason Eval/Treat Not Completed: Patient not medically ready. Pt remains on ventilator. Will s/o; please re-consult when appropriate.  Deneise Lever, [place], CCC-SLP Speech-Language Pathologist [phone]   Aliene Altes 04/16/2017, 10:34 AM

## 2017-04-16 NOTE — Progress Notes (Signed)
[facility] Progress Note Patient Name: Glen [name] DOB: April 17, 1970 MRN: [id number]   Date of Service  04/16/2017  HPI/Events of Note  Seizure like tremor - Patient appears to be awake and trying to verbalize according to bedside nurse. Therefore, not generalized tonic clonic seizure. This might be a focal or partial seizure of some variety vs tremor. Would defer management to neurosurgery.   eICU Interventions  Will order: 1. Versed 1 mg IV now.  2. Further management per Neurosurgery.      Intervention Category Major Interventions: Seizures - evaluation and management  Lysle Dingwall 04/16/2017, 9:48 PM

## 2017-04-17 ENCOUNTER — Inpatient Hospital Stay (HOSPITAL_COMMUNITY): Payer: Medicare Other

## 2017-04-17 DIAGNOSIS — J9600 Acute respiratory failure, unspecified whether with hypoxia or hypercapnia: Secondary | ICD-10-CM

## 2017-04-17 DIAGNOSIS — S065X9A Traumatic subdural hemorrhage with loss of consciousness of unspecified duration, initial encounter: Principal | ICD-10-CM

## 2017-04-17 DIAGNOSIS — R259 Unspecified abnormal involuntary movements: Secondary | ICD-10-CM

## 2017-04-17 LAB — CBC WITH DIFFERENTIAL/PLATELET
BASOS ABS: 0 10*3/uL (ref 0.0–0.1)
BASOS PCT: 0 %
EOS ABS: 0 10*3/uL (ref 0.0–0.7)
EOS PCT: 0 %
HCT: 37.8 % — ABNORMAL LOW (ref 39.0–52.0)
Hemoglobin: 12 g/dL — ABNORMAL LOW (ref 13.0–17.0)
Lymphocytes Relative: 10 %
Lymphs Abs: 1.8 10*3/uL (ref 0.7–4.0)
MCH: 28.5 pg (ref 26.0–34.0)
MCHC: 31.7 g/dL (ref 30.0–36.0)
MCV: 89.8 fL (ref 78.0–100.0)
MONO ABS: 1.3 10*3/uL — AB (ref 0.1–1.0)
Monocytes Relative: 7 %
Neutro Abs: 16.1 10*3/uL — ABNORMAL HIGH (ref 1.7–7.7)
Neutrophils Relative %: 83 %
PLATELETS: 403 10*3/uL — AB (ref 150–400)
RBC: 4.21 MIL/uL — ABNORMAL LOW (ref 4.22–5.81)
RDW: 13.9 % (ref 11.5–15.5)
WBC: 19.2 10*3/uL — ABNORMAL HIGH (ref 4.0–10.5)

## 2017-04-17 LAB — BASIC METABOLIC PANEL
Anion gap: 9 (ref 5–15)
BUN: 17 mg/dL (ref 6–20)
CO2: 26 mmol/L (ref 22–32)
CREATININE: 0.76 mg/dL (ref 0.61–1.24)
Calcium: 8.8 mg/dL — ABNORMAL LOW (ref 8.9–10.3)
Chloride: 101 mmol/L (ref 101–111)
GFR calc Af Amer: >60 mL/min (ref >60)
Glucose, Bld: 142 mg/dL — ABNORMAL HIGH (ref 65–99)
Potassium: 4.3 mmol/L (ref 3.5–5.1)
Sodium: 136 mmol/L (ref 135–145)

## 2017-04-17 LAB — GLUCOSE, CAPILLARY
GLUCOSE-CAPILLARY: 135 mg/dL — AB (ref 65–99)
GLUCOSE-CAPILLARY: 143 mg/dL — AB (ref 65–99)
GLUCOSE-CAPILLARY: 149 mg/dL — AB (ref 65–99)
GLUCOSE-CAPILLARY: 155 mg/dL — AB (ref 65–99)
Glucose-Capillary: 148 mg/dL — ABNORMAL HIGH (ref 65–99)

## 2017-04-17 LAB — MAGNESIUM: Magnesium: 2.2 mg/dL (ref 1.7–2.4)

## 2017-04-17 NOTE — Procedures (Signed)
ELECTROENCEPHALOGRAM REPORT  Date of Study: 04/17/2017  Patient's Name: Glen [name] MRN: [id number] Date of Birth: Jun 05, 1970  Referring Provider: Dr. Roland Rack  Clinical History: This is a 47 year old man with history of brain tumor and seizures, with right SDH s/p craniotomy with concern for seizures.  Medications: Keppra Fentanyl  Technical Summary: A multichannel digital EEG recording measured by the international 10-20 system with electrodes applied with paste and impedances below 5000 ohms performed as portable with EKG monitoring in an awake and agitated patient.  Hyperventilation and photic stimulation were not performed.  The digital EEG was referentially recorded, reformatted, and digitally filtered in a variety of bipolar and referential montages for optimal display.   Description: The patient is awake and agitated during the recording. There is no clear posterior dominant rhythm. The background consists of a large amount of diffuse polymorphic theta and delta slowing. There is muscle artifact obscuring the left temporal region throughout the study. Patient has frequent eye blinking throughout the study with blink artifact, no epileptiform correlate. Sleep was not captured. He is noted to be hitting the bed repeatedly during the study, no EEG correlate. Hyperventilation and photic stimulation were not performed. There were no epileptiform discharges or electrographic seizures seen.    EKG lead was unremarkable.  Impression: This awake EEG is abnormal due to the presence of moderate diffuse background slowing. It is a limited study as the left temporal region is obscured by muscle artifact throughout the study.  Clinical Correlation of the above findings indicates diffuse cerebral dysfunction that is non-specific in etiology and can be seen with hypoxic/ischemic injury, toxic/metabolic encephalopathies, neurodegenerative disorders, or medication effect. The absence  of epileptiform discharges does not rule out a clinical diagnosis of epilepsy.  Clinical correlation is advised.   Ellouise Newer, M.D.

## 2017-04-17 NOTE — Progress Notes (Signed)
   Subjective:    Patient ID: Glen [name], male    DOB: 09-06-1970, 47 y.o.   MRN: [id number]  HPI Has done well following craniotomy by Dr. [name].  Was extubated 3/28 but had to be reintubated the same day due to stridor.  There is no cuff leak around his 6.5 ETT and it is not advancing.  Review of Systems     Objective:   Physical Exam AF VSS Alert, interactive.  Communicates with blinking. 6.5 ETT in place. Trach scar.    Assessment & Plan:  Stridor, difficult intubation Will discuss with mother plan to proceed with direct laryngoscopy and likely tracheostomy.  Still receiving tube feedings currently.  Will plan procedure tomorrow.

## 2017-04-17 NOTE — Progress Notes (Signed)
Subjective: Patient reports startles, but not following commands.  Objective: Vital signs in last 24 hours: Temp:  [98.2 F (36.8 C)-98.6 F (37 C)] 98.3 F (36.8 C) (04/01 0400) Pulse Rate:  [45-81] 58 (04/01 0700) Resp:  [11-22] 12 (04/01 0700) BP: (100-154)/(57-87) 100/62 (04/01 0700) SpO2:  [96 %-100 %] 98 % (04/01 0700) FiO2 (%):  [40 %] 40 % (04/01 0335) Weight:  [87.3 kg (192 lb 7.4 oz)] 87.3 kg (192 lb 7.4 oz) (04/01 0500)  Intake/Output from previous day: 03/31 0701 - 04/01 0700 In: 2420 [I.V.:1100; NG/GT:1320] Out: 2330 [Urine:2330] Intake/Output this shift: No intake/output data recorded.  Physical Exam: Opens eyes briskly, appears to attend.  MAEW to pain and spontaneously, but not following commands.  Lab Results: Recent Labs    04/15/17 0843 04/17/17 0248  WBC 21.4* 19.2*  HGB 13.5 12.0*  HCT 40.7 37.8*  PLT 352 403*   BMET Recent Labs    04/15/17 0843 04/17/17 0248  NA 140 136  K 3.9 4.3  CL 103 101  CO2 26 26  GLUCOSE 130* 142*  BUN 13 17  CREATININE 0.87 0.76  CALCIUM 9.4 8.8*    Studies/Results: Dg Chest Port 1 View  Result Date: 04/15/2017 CLINICAL DATA:  Endotracheal tube placement. EXAM: PORTABLE CHEST 1 VIEW COMPARISON:  Radiograph of same day. FINDINGS: The heart size and mediastinal contours are within normal limits. Both lungs are clear. Endotracheal tube tip is 7 cm above the carina. Nasogastric tube is seen entering the stomach. No pneumothorax or pleural effusion is noted. Left-sided ventriculoperitoneal shunt is noted. The visualized skeletal structures are unremarkable. IMPRESSION: Endotracheal and nasogastric tubes are unchanged in position. No acute cardiopulmonary abnormality seen. Electronically Signed   By: Marijo Conception, M.D.   On: 04/15/2017 20:22   Dg Chest Port 1 View  Result Date: 04/15/2017 CLINICAL DATA:  Evaluate endotracheal tube. EXAM: PORTABLE CHEST 1 VIEW COMPARISON:  04/15/2017 and prior radiographs FINDINGS:  An endotracheal tube is unchanged with tip approximately 7 cm above the carina. An endotracheal tube enters the stomach with tip off the field of view. Cardiomediastinal silhouette is stable. There is no evidence of focal airspace disease, pulmonary edema, suspicious pulmonary nodule/mass, pleural effusion, or pneumothorax. VP shunt catheter overlying the LEFT chest again noted. IMPRESSION: No acute abnormality.  Support apparatus as described. Electronically Signed   By: Margarette Canada M.D.   On: 04/15/2017 14:57    Assessment/Plan: Possible seizure activity last night.  Neurology to assess.  Wean ventilator today. Continue support.    LOS: 8 days    [name], MD 04/17/2017, 7:22 AM

## 2017-04-17 NOTE — Progress Notes (Signed)
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  Apparently fell 2 weeks ago hitting his head.  Complained of a headache to his mother on the morning of 3/24 while at church.  Additionally, he was recently changed from depakote to [place] due to hallucinations by Dr. Krista Blue who treats him for seizures.   There was concern for sepsis with initial presentation of low-grade fever and elevated WBC at [facility] and was treated with rocephin.  CT head showed a large subdural hematoma on the right.  He was transferred to [facility] ED, lethargic with temperature of 102.8, unknown baseline mental status, admitted to Neurosurgery and taken to OR for craniotomy and hematoma evacuation.  He was given Novoseven in OR as patient had been taking arixta.  ENT consulted in OR due to anesthesia team having difficulty passing ETT past glottis with inspiratory stridor, however remained easy to ventilate with bag/mask ventilation where a 7.0 ETT was placed using a Storz laryngoscope. Patient returns to ICU post-operatively and PCCM consulted for ventilator management and medical management.    SUBJECTIVE: Failed extubation on 3/28 due to stridor and remains intubated and mechanically ventilated.  He has not been able to move air around the 6.5 endotracheal tube nor have we been able to advance the tube which is  very high on the chest x-ray.  Considering that they encountered similar difficulties when the intubated him in the operating room, Dr. Pearline Cables asked ENT to evaluate the patient to determine whether he has a subglottic stenosis.  They  will be evaluating him on 4/1.    Tolerating SBT well again with PS 5/5. Had some tremor / seizure like activity overnight and neurosurgery requested that neurology see pt again.  Neuro to see this AM.   VITAL SIGNS: BP 116/72   Pulse 76   Temp 98.2 F (36.8 C) (Axillary)   Resp 18   Ht 5\' 10"  (1.778 m)   Wt 87.3 kg (192 lb 7.4 oz)   SpO2 99%   BMI 27.62 kg/m   HEMODYNAMICS:    VENTILATOR SETTINGS: Vent Mode: CPAP;PSV FiO2 (%):  [40 %] 40 % Set Rate:  [12 bmp] 12 bmp Vt Set:  [580 mL] 580 mL PEEP:  [5 cmH20] 5 cmH20 Pressure Support:  [5 cmH20] 5 cmH20 Plateau Pressure:  [12 cmH20-15 cmH20] 14 cmH20  INTAKE / OUTPUT: I/O last 3 completed shifts: In: 1937 [I.V.:1800; NG/GT:2160] Out: 3655 [Urine:3655]  PHYSICAL EXAMINATION: General: Intubated young male in no distress.   Neuro: Spontaneous eye opening, but not fully following commands this AM.  Pupils are equal. Lungs: He is unlabored on a pressure support of 5.  There is symmetric air movement no wheezes. Abdomen: The abdomen is soft without organomegaly masses or tenderness.    Musculoskeletal: No dependent edema.  LABS:  BMET Recent Labs  Lab 04/14/17 0440 04/15/17 0843 04/17/17 0248  NA 136 140 136  K 3.8 3.9 4.3  CL 105 103 101  CO2 23  26 26  BUN 10 13 17   CREATININE 0.73 0.87 0.76  GLUCOSE 137* 130* 142*    Electrolytes Recent Labs  Lab 04/13/17 1837 04/14/17 0440 04/15/17 0843 04/17/17 0248  CALCIUM  --  8.7* 9.4 8.8*  MG 1.5* 2.0 2.2 2.2  PHOS 3.6 4.2 2.6  --     CBC Recent Labs  Lab 04/14/17 0440 04/15/17 0843 04/17/17 0248  WBC 8.4 21.4* 19.2*  HGB 11.2* 13.5 12.0*  HCT 34.1* 40.7 37.8*  PLT 280 352 403*    Coag's No results for input(s): APTT,  INR in the last 168 hours.  Sepsis Markers No results for input(s): LATICACIDVEN, PROCALCITON, O2SATVEN in the last 168 hours.  ABG Recent Labs  Lab 04/11/17 1530 04/13/17 2033  PHART 7.527* 7.410  PCO2ART 27.2* 45.3  PO2ART 186.0* 416.0*    Liver Enzymes No results for input(s): AST, ALT, ALKPHOS, BILITOT, ALBUMIN in the last 168 hours.  Cardiac Enzymes No results for input(s): TROPONINI, PROBNP in the last 168 hours.  Glucose Recent Labs  Lab 04/16/17 0407 04/16/17 0720 04/16/17 1212 04/16/17 1637 04/16/17 2132 04/17/17 0836  GLUCAP 142* 126* 131* 123* 131* 143*    Imaging Dg Chest Port 1 View  Result Date: 04/17/2017 CLINICAL DATA:  Shortness of breath. History of craniotomy for evacuation of a subdural hematoma 1 week ago. EXAM: PORTABLE CHEST 1 VIEW COMPARISON:  Chest x-ray of April 15, 2017 FINDINGS: The lungs are reasonably well inflated and clear. The heart and pulmonary vascularity are normal. The endotracheal tube tip projects 4.2 cm above the carina. The esophagogastric tube tip projects below the inferior margin of the image likely in the distal stomach or proximal duodenum. A left-sided ventriculoperitoneal shunt tube is visible. IMPRESSION: No pneumonia nor CHF. The support tubes are in reasonable position. Electronically Signed   By: David  Martinique M.D.   On: 04/17/2017 08:06    DISCUSSION:      This is a 47 year old with a prior CNS tumor followed by a medulloblastoma with VP shunt placement who presented with altered mental status and a subdural hematoma.  The subdural was evacuated and he was subsequently suspected of having seizure activity with need  totransfer to ICU for control of seizures.  Of note he was a difficult intubation. Extubation was attempted afternoon of 3/28 but unfortunately was not successful due to stridor that was refractory to racemic epi and decadron.  He required re-intubation and had to use 6.5 ETT due to edematous cords.  There were  similar difficulties with intubation in the operating room I have asked ENT to evaluate the patient for possible subglottic stenosis on 4/1.  ASSESSMENT / PLAN:  PULMONARY A:  He remains intubated after he failed extubation yesterday (3/28) due to stridor requiring re-intubation. Vocal cord edema. ? Subglottic stenosis - unable to move air around 6.5 ETT and unable to advance tube further. P: Continue vent support and continue daily SBT. Bronchial hygiene. Continue Decadron for now. As noted, ENT has been consulted to evaluate his airway.  From a mechanics and gas exchange standpoint, doing well enough to consider extubation.  CARDIOVASCULAR A:  No acute issues. P: No interventions required.  RENAL A: Hyponatremia - corrected with isotonic fluid administration.  His plasma cortisol was only 5.4 3/29 which is potentially normal in a nonstressed situation.    P: Monitor BMP.  GASTROINTESTINAL A:  Stress ulcer prophylaxis. Nutrition. P: Continue Protonix Continue TF's  HEMATOLOGIC A:  VTE prophylaxis. P: He is on  no chemical prophylaxis for DVTs due to his CNS surgery, he is solely on SCDs.  ENDOCRINE A:  Hypothyroidism. P: Continue synthroid.  NEUROLOGIC A:  SDH s/p evacuation by neurosurgery (Dr. [name]). Seizures - had  tremors / possible facial twitching / seizures overnight 3/31. Sedation needs due to mechanical ventilation. Hx CNS tumor followed by a medulloblastoma with VP shunt. P Post op care per neurosurgery. Continue decadron. Continue Keppra 1500 BID per neurology - neurology to see again AM 4/1 given tremors / possible seizures overnight 3/31. Ensure that his electrolytes are corrected to make sure that he does not have a metabolic substrate for ongoing seizures. Sedation: Propofol gtt / fentanyl PRN. RASS goal: 0 to -1. Daily WUA.  CC time: 30 min.   Montey Hora, [facility] Pager: [phone]  or  [phone] 04/17/2017, 10:39 AM

## 2017-04-17 NOTE — Progress Notes (Signed)
EEG completed; results pending.    

## 2017-04-17 NOTE — Progress Notes (Signed)
Patient transported to CT and back to room 7R91 without complications.

## 2017-04-17 NOTE — Progress Notes (Signed)
Subjective: Not following commands  Exam: Vitals:   04/17/17 1000 04/17/17 1100  BP: 116/72 125/76  Pulse: 76 75  Resp: 18 15  Temp:    SpO2: 99% 100%   Gen: In bed, NAD Resp: non-labored breathing, no acute distress Abd: soft, nt   Neuro: MS: Awake, alert, engages with the examiner but does not follow commands. CN: Appears to blink to threat bilaterally, but difficult to determine because he is blinking frequently. Motor: He moves all extremities with good strength Sensory: Localizes bilaterally   Pertinent Labs: BMP-unremarkable  Impression: 47 year old male with subdural hematoma and concern for recurrent partial seizures.  He apparently had some EEG negative abnormal movements earlier in the hospitalization, and therefore I think it is less clear if these were recurrent seizures.  The thing that is more concerning and confusing to me as his lack of speech, given that his subdural hematoma is right-sided.  Recommendations: 1) CT head 2) repeat EEG 3) continue Keppra 4) further recommendations following these tests  Roland Rack, MD [facility] [phone]  If 7pm- 7am, please page neurology on call as listed in [place].

## 2017-04-17 NOTE — Progress Notes (Signed)
PT Cancellation Note  Patient Details Name: Glen [name] MRN: [id number] DOB: 01/13/1971   Cancelled Treatment:    Reason Eval/Treat Not Completed: Patient at procedure or test/unavailable.  Pt at CT scan this afternoon when arrived to see him.  Will likely see pt as able 4/2. 04/17/2017  Donnella Sham, PT [phone]  (pager)   Tessie Fass [name] 04/17/2017, 5:50 PM

## 2017-04-17 NOTE — Progress Notes (Signed)
[place] [facility] Progress Note Patient Name: Glen [name] DOB: [birth year] MRN: [id number]   Date of Service  04/17/2017  HPI/Events of Note  Agitation - Patient attempting to self extubate. Request for bilateral soft wrist restraints.   eICU Interventions  Will order bilateral soft wrist restraints.      Intervention Category Minor Interventions: Agitation / anxiety - evaluation and management  Lysle Dingwall 04/17/2017, 9:39 PM

## 2017-04-17 NOTE — Progress Notes (Signed)
OT Cancellation Note  Patient Details Name: SHADRACH BARTUNEK MRN: [id number] DOB: [birth year]   Cancelled Treatment:    Reason Eval/Treat Not Completed: Patient at procedure or test/ unavailable.  Pt at CT.  Will reattempt.  [place], OTR/L [phone]   Lucille Passy M 04/17/2017, 5:48 PM

## 2017-04-17 NOTE — Progress Notes (Signed)
Patient had seizure like activity localized to the right said of the face. Rhythmic twitching and drooling noted. CCM notified at [facility]. Dr. Oletta Darter ordered Versed 1mg  which did calm the patient and stopped the twitching. Patient remained unchanged neurologically from shift assessment. Neurosurgery Annette Stable, MD) contacted and advised to continue to monitor.

## 2017-04-17 NOTE — Progress Notes (Signed)
Dr. [name] requested Neurology to re-visit pt after nightshift RN noted right facial twitching. Dr. Leonel Ramsay notified this am and will see patient again. Pt does not have any seizure-like activity at this time.  Kairee Isa, Rande Brunt, RN

## 2017-04-18 ENCOUNTER — Encounter (HOSPITAL_COMMUNITY): Payer: Self-pay | Admitting: Anesthesiology

## 2017-04-18 ENCOUNTER — Inpatient Hospital Stay (HOSPITAL_COMMUNITY): Payer: Medicare Other | Admitting: Anesthesiology

## 2017-04-18 ENCOUNTER — Inpatient Hospital Stay (HOSPITAL_COMMUNITY): Payer: Medicare Other

## 2017-04-18 ENCOUNTER — Encounter (HOSPITAL_COMMUNITY)
Admission: EM | Disposition: A | Discharge status: Rehabilitation Facility | Payer: Self-pay | Source: Home / Self Care | Attending: Neurosurgery

## 2017-04-18 DIAGNOSIS — J9601 Acute respiratory failure with hypoxia: Secondary | ICD-10-CM

## 2017-04-18 DIAGNOSIS — J9602 Acute respiratory failure with hypercapnia: Secondary | ICD-10-CM

## 2017-04-18 HISTORY — PX: TRACHEOSTOMY TUBE PLACEMENT: SHX814

## 2017-04-18 HISTORY — PX: DIRECT LARYNGOSCOPY: SHX5326

## 2017-04-18 LAB — POCT I-STAT 3, ART BLOOD GAS (G3+)
Acid-Base Excess: 7 mmol/L — ABNORMAL HIGH (ref 0.0–2.0)
BICARBONATE: 31.1 mmol/L — AB (ref 20.0–28.0)
O2 Saturation: 100 %
PCO2 ART: 42.9 mmHg (ref 32.0–48.0)
PO2 ART: 199 mmHg — AB (ref 83.0–108.0)
Patient temperature: 98.3
TCO2: 32 mmol/L (ref 22–32)
pH, Arterial: 7.468 — ABNORMAL HIGH (ref 7.350–7.450)

## 2017-04-18 LAB — BASIC METABOLIC PANEL
ANION GAP: 10 (ref 5–15)
BUN: 22 mg/dL — ABNORMAL HIGH (ref 6–20)
CO2: 26 mmol/L (ref 22–32)
Calcium: 8.8 mg/dL — ABNORMAL LOW (ref 8.9–10.3)
Chloride: 99 mmol/L — ABNORMAL LOW (ref 101–111)
Creatinine, Ser: 0.85 mg/dL (ref 0.61–1.24)
Glucose, Bld: 131 mg/dL — ABNORMAL HIGH (ref 65–99)
POTASSIUM: 4.5 mmol/L (ref 3.5–5.1)
SODIUM: 135 mmol/L (ref 135–145)

## 2017-04-18 LAB — CBC
HEMATOCRIT: 37.9 % — AB (ref 39.0–52.0)
HEMOGLOBIN: 12.5 g/dL — AB (ref 13.0–17.0)
MCH: 29.4 pg (ref 26.0–34.0)
MCHC: 33 g/dL (ref 30.0–36.0)
MCV: 89.2 fL (ref 78.0–100.0)
Platelets: 391 10*3/uL (ref 150–400)
RBC: 4.25 MIL/uL (ref 4.22–5.81)
RDW: 13.7 % (ref 11.5–15.5)
WBC: 17.1 10*3/uL — AB (ref 4.0–10.5)

## 2017-04-18 LAB — GLUCOSE, CAPILLARY
GLUCOSE-CAPILLARY: 103 mg/dL — AB (ref 65–99)
GLUCOSE-CAPILLARY: 133 mg/dL — AB (ref 65–99)
GLUCOSE-CAPILLARY: 136 mg/dL — AB (ref 65–99)
Glucose-Capillary: 123 mg/dL — ABNORMAL HIGH (ref 65–99)
Glucose-Capillary: 92 mg/dL (ref 65–99)
Glucose-Capillary: 103 mg/dL — ABNORMAL HIGH (ref 65–99)
Glucose-Capillary: 148 mg/dL — ABNORMAL HIGH (ref 65–99)

## 2017-04-18 LAB — PHOSPHORUS: PHOSPHORUS: 3.5 mg/dL (ref 2.5–4.6)

## 2017-04-18 LAB — MAGNESIUM: MAGNESIUM: 2.3 mg/dL (ref 1.7–2.4)

## 2017-04-18 SURGERY — LARYNGOSCOPY, DIRECT
Anesthesia: General | Site: Throat

## 2017-04-18 MED ORDER — PROMETHAZINE HCL 25 MG/ML IJ SOLN: 6.2500 mg | INTRAMUSCULAR | Status: DC | PRN

## 2017-04-18 MED ORDER — ROCURONIUM BROMIDE 10 MG/ML (PF) SYRINGE
PREFILLED_SYRINGE | INTRAVENOUS | Status: AC
  Filled 2017-04-18: qty 5

## 2017-04-18 MED ORDER — OXYMETAZOLINE HCL 0.05 % NA SOLN
NASAL | Status: AC
Start: 2017-04-18 — End: ?
  Filled 2017-04-18: qty 15

## 2017-04-18 MED ORDER — PROPOFOL 10 MG/ML IV BOLUS
INTRAVENOUS | Status: AC
Start: 2017-04-18 — End: ?
  Filled 2017-04-18: qty 20

## 2017-04-18 MED ORDER — LEVETIRACETAM IN NACL 1500 MG/100ML IV SOLN
1500.0000 mg | Freq: Two times a day (BID) | INTRAVENOUS | Status: DC
  Administered 2017-04-18 – 2017-04-23 (×10): 1500 mg via INTRAVENOUS
  Filled 2017-04-18 (×11): qty 100

## 2017-04-18 MED ORDER — FENTANYL CITRATE (PF) 250 MCG/5ML IJ SOLN
INTRAMUSCULAR | Status: AC
  Filled 2017-04-18: qty 5

## 2017-04-18 MED ORDER — HYDROMORPHONE HCL 1 MG/ML IJ SOLN: 0.2500 mg | INTRAMUSCULAR | Status: DC | PRN

## 2017-04-18 MED ORDER — MIDAZOLAM HCL 2 MG/2ML IJ SOLN
INTRAMUSCULAR | Status: AC
  Filled 2017-04-18: qty 2

## 2017-04-18 MED ORDER — SUGAMMADEX SODIUM 500 MG/5ML IV SOLN
INTRAVENOUS | Status: AC
  Filled 2017-04-18: qty 5

## 2017-04-18 MED ORDER — EPINEPHRINE HCL (NASAL) 0.1 % NA SOLN
NASAL | Status: AC
Start: 2017-04-18 — End: ?
  Filled 2017-04-18: qty 30

## 2017-04-18 MED ORDER — LIDOCAINE-EPINEPHRINE 1 %-1:100000 IJ SOLN
INTRAMUSCULAR | Status: AC
  Filled 2017-04-18: qty 1

## 2017-04-18 MED ORDER — MIDAZOLAM HCL 5 MG/5ML IJ SOLN
INTRAMUSCULAR | Status: DC | PRN
  Administered 2017-04-18: 2 mg via INTRAVENOUS

## 2017-04-18 MED ORDER — ROCURONIUM BROMIDE 100 MG/10ML IV SOLN
INTRAVENOUS | Status: DC | PRN
  Administered 2017-04-18: 50 mg via INTRAVENOUS

## 2017-04-18 MED ORDER — CHLORHEXIDINE GLUCONATE CLOTH 2 % EX PADS
6.0000 | MEDICATED_PAD | Freq: Once | CUTANEOUS | Status: AC
  Administered 2017-04-18: 6 via TOPICAL

## 2017-04-18 MED ORDER — FENTANYL CITRATE (PF) 100 MCG/2ML IJ SOLN
INTRAMUSCULAR | Status: DC | PRN
  Administered 2017-04-18: 50 ug via INTRAVENOUS

## 2017-04-18 MED ORDER — SUGAMMADEX SODIUM 500 MG/5ML IV SOLN
INTRAVENOUS | Status: DC | PRN
  Administered 2017-04-18: 400 mg via INTRAVENOUS

## 2017-04-18 MED ORDER — GADOBENATE DIMEGLUMINE 529 MG/ML IV SOLN
15.0000 mL | Freq: Once | INTRAVENOUS | Status: AC | PRN
  Administered 2017-04-18: 15 mL via INTRAVENOUS

## 2017-04-18 MED ORDER — DEXAMETHASONE SODIUM PHOSPHATE 10 MG/ML IJ SOLN
6.0000 mg | Freq: Two times a day (BID) | INTRAMUSCULAR | Status: DC
  Administered 2017-04-18 – 2017-04-19 (×2): 6 mg via INTRAVENOUS
  Filled 2017-04-18 (×2): qty 1

## 2017-04-18 MED ORDER — LACTATED RINGERS IV SOLN
INTRAVENOUS | Status: DC | PRN
  Administered 2017-04-18: 11:00:00 via INTRAVENOUS

## 2017-04-18 SURGICAL SUPPLY — 50 items
BALLN PULM 15 16.5 18 X 75CM (BALLOONS)
BALLN PULM 15 16.5 18X75 (BALLOONS)
BALLOON PULM 15 16.5 18X75 (BALLOONS) IMPLANT
CANISTER SUCT 3000ML PPV (MISCELLANEOUS) ×4 IMPLANT
CLEANER TIP ELECTROSURG 2X2 (MISCELLANEOUS) ×4 IMPLANT
COVER BACK TABLE 60X90IN (DRAPES) ×4 IMPLANT
COVER MAYO STAND STRL (DRAPES) ×4 IMPLANT
COVER SURGICAL LIGHT HANDLE (MISCELLANEOUS) ×2 IMPLANT
CRADLE DONUT ADULT HEAD (MISCELLANEOUS) ×2 IMPLANT
DECANTER SPIKE VIAL GLASS SM (MISCELLANEOUS) ×4 IMPLANT
DRAPE HALF SHEET 40X57 (DRAPES) ×6 IMPLANT
ELECT COATED BLADE 2.86 ST (ELECTRODE) ×4 IMPLANT
ELECT REM PT RETURN 9FT ADLT (ELECTROSURGICAL) ×4
ELECTRODE REM PT RTRN 9FT ADLT (ELECTROSURGICAL) ×2 IMPLANT
GAUZE PETROLATUM 1 X8 (GAUZE/BANDAGES/DRESSINGS) IMPLANT
GAUZE SPONGE 4X4 16PLY XRAY LF (GAUZE/BANDAGES/DRESSINGS) ×4 IMPLANT
GLOVE BIO SURGEON STRL SZ7.5 (GLOVE) ×4 IMPLANT
GLOVE BIOGEL PI IND STRL 7.5 (GLOVE) IMPLANT
GLOVE BIOGEL PI INDICATOR 7.5 (GLOVE) ×2
GOWN STRL REUS W/ TWL LRG LVL3 (GOWN DISPOSABLE) ×4 IMPLANT
GOWN STRL REUS W/TWL LRG LVL3 (GOWN DISPOSABLE) ×12
GUARD TEETH (MISCELLANEOUS) ×2 IMPLANT
HOLDER TRACH TUBE VELCRO 19.5 (MISCELLANEOUS) ×4 IMPLANT
KIT BASIN OR (CUSTOM PROCEDURE TRAY) ×4 IMPLANT
KIT SUCTION CATH 14FR (SUCTIONS) IMPLANT
KIT TURNOVER KIT B (KITS) ×4 IMPLANT
NDL HYPO 25GX1X1/2 BEV (NEEDLE) ×2 IMPLANT
NDL TRANS ORAL INJECTION (NEEDLE) IMPLANT
NEEDLE HYPO 25GX1X1/2 BEV (NEEDLE) ×4 IMPLANT
NEEDLE TRANS ORAL INJECTION (NEEDLE) IMPLANT
NS IRRIG 1000ML POUR BTL (IV SOLUTION) ×4 IMPLANT
PACK EENT II TURBAN DRAPE (CUSTOM PROCEDURE TRAY) ×4 IMPLANT
PAD ARMBOARD 7.5X6 YLW CONV (MISCELLANEOUS) ×8 IMPLANT
PATTIES SURGICAL .5 X3 (DISPOSABLE) IMPLANT
PENCIL BUTTON HOLSTER BLD 10FT (ELECTRODE) ×4 IMPLANT
SOLUTION ANTI FOG 6CC (MISCELLANEOUS) ×2 IMPLANT
SPONGE DRAIN TRACH 4X4 STRL 2S (GAUZE/BANDAGES/DRESSINGS) ×4 IMPLANT
SPONGE INTESTINAL PEANUT (DISPOSABLE) ×2 IMPLANT
SURGILUBE 2OZ TUBE FLIPTOP (MISCELLANEOUS) IMPLANT
SUT SILK 0 FSL (SUTURE) ×4 IMPLANT
SUT SILK 2 0 REEL (SUTURE) ×4 IMPLANT
SUT SILK 2 0 SH CR/8 (SUTURE) ×4 IMPLANT
SUT VIC AB 2-0 FS1 27 (SUTURE) IMPLANT
SYR 20ML ECCENTRIC (SYRINGE) ×4 IMPLANT
SYR BULB 3OZ (MISCELLANEOUS) ×4 IMPLANT
SYR CONTROL 10ML LL (SYRINGE) ×4 IMPLANT
TOWEL OR 17X24 6PK STRL BLUE (TOWEL DISPOSABLE) ×8 IMPLANT
TUBE CONNECTING 12'X1/4 (SUCTIONS) ×2
TUBE CONNECTING 12X1/4 (SUCTIONS) ×4 IMPLANT
WATER STERILE IRR 1000ML POUR (IV SOLUTION) ×4 IMPLANT

## 2017-04-18 NOTE — Brief Op Note (Signed)
04/18/2017  12:57 PM  PATIENT:  Glen [name]  47 y.o. male  PRE-OPERATIVE DIAGNOSIS:  difficult intubation  POST-OPERATIVE DIAGNOSIS:  difficult intubation  PROCEDURE:  Procedure(s): DIRECT LARYNGOSCOPY (N/A) TRANSNASAL FIBER OPTIC LARYNGOSCOPY (N/A)  SURGEON:  Surgeon(s) and Role:    [name], MD - Primary  PHYSICIAN ASSISTANT:   ASSISTANTS: none   ANESTHESIA:   general  EBL: None  BLOOD ADMINISTERED:none  DRAINS: none   LOCAL MEDICATIONS USED:  None  SPECIMEN:  No Specimen  DISPOSITION OF SPECIMEN:  N/A  COUNTS:  YES  TOURNIQUET:  * No tourniquets in log *  DICTATION: .Other Dictation: Dictation Number [id number]  PLAN OF CARE: Return to ICU  PATIENT DISPOSITION:  PACU - hemodynamically stable.   Delay start of Pharmacological VTE agent (>24hrs) due to surgical blood loss or risk of bleeding: no

## 2017-04-18 NOTE — Progress Notes (Signed)
RT Note-Patient had been changed to wean by CCM, not tolerating well, placed back to full support, patient is scheduled to go to the OR.
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
## 2017-04-18 NOTE — Progress Notes (Signed)
Patient had a period of apnea. He desaturated into the 70s. He was placed on a non-rebreather and his oxygen improved. Patient is now on a nasal cannula at 4L. Will notify doctor and continue to monitor. Thayer Ohm D

## 2017-04-18 NOTE — Progress Notes (Signed)
  RT note-Patient with questionable apnea or very shallow respirations with Sp02 in the mid 70's, placed on NRB 100% and then transitioned to 4l/min [place]. Sp02 now 100%m family remains at the bedside.

## 2017-04-18 NOTE — Op Note (Signed)
Glen [name], Glen [name]              ACCOUNT NO.:  [id number]  MEDICAL RECORD NO.:  [id number]  LOCATION:  [id number]                        FACILITY:  [facility]  PHYSICIAN:  Onnie Graham, MD     DATE OF BIRTH:  11-04-1970  DATE OF PROCEDURE:  04/18/2017 DATE OF DISCHARGE:                              OPERATIVE REPORT   PREOPERATIVE DIAGNOSIS:  Difficult intubation and stridor.  POSTOPERATIVE DIAGNOSIS:  Difficult intubation and stridor.  PROCEDURES: 1. Direct laryngoscopy. 2. Transnasal fiberoptic laryngoscopy.  SURGEON:  Onnie Graham, MD  ANESTHESIA:  General endotracheal anesthesia.  COMPLICATIONS:  None.  INDICATION:  The patient is a 47 year old male who underwent emergency craniotomy a little over a week ago for subdural hematoma.  Intubation was difficult for the case, and consultation was requested from ENT. Evaluation of the airway at that time demonstrated a nodule granulation tissue at the previous trach site on the anterior wall of the trachea, and a rightward curvature to the trachea slightly.  He was able to be easily intubated.  He has recovered from his surgery and was extubated last week, but developed stridor and difficulty breathing, had to be reintubated.  He had been able to be weaned from the ventilator and is ready for extubation.  Thus, he was brought to the operating room for evaluation.  FINDINGS:  Upon direct laryngoscopy, the epiglottis and vocal cords were without edema.  The endotracheal tube was removed, and there was inflammatory changes of the posterior glottis as expected, and the same tracheal findings as before with a small nodular granulation at the anterior tracheal wall at the site of the previous tracheostomy and some curvature to the right of the trachea.  There was a little bit of a narrowed area more distally in the mid trachea as well, but not obstructing.  The patient was able to be extubated and fiberoptic exam of the larynx after  extubation demonstrated adequate airway space and epiglottis.  He did not follow commands, however, to try to demonstrate vocal cord movement.  DESCRIPTION OF PROCEDURE:  The patient was identified in the intensive care unit and brought to the operative suite and put on the operative table in supine position.  Anesthesia was induced, and the eyes taped closed.  The bed was turned 90 degrees from Anesthesia and damp gauze placed over the upper gum.  A Storz laryngoscope was placed in the supraglottic position, and the endotracheal tube was then backed out of the airway after deflating the cuff.  The larynx and trachea were then examined with a 0-degree telescope with findings noted above.  A 6.5 endotracheal tube was then placed and cuff inflated, and the patient ventilated through the endotracheal tube.  A discussion was then had with the Critical Care team about the status of his anatomy and the possibility for extubation, and they agreed with proceeding if the anatomy looks favorable.  Thus, the laryngoscope was taken out of suspension and removed from the patient's mouth over the endotracheal tube.  He was turned back to anesthesia and allowed to wake up fully. He was then extubated and breathe comfortably with no stridor.  The flexible laryngoscope was then placed to the  right nasal passage to view the larynx.  Findings were noted above.  After this, the patient was returned to anesthesia for transport to the recovery room in stable condition.  Of note, photographs were made during direct laryngoscopy and placed on chart.     Onnie Graham, MD     DDB/MEDQ  D:  04/18/2017  T:  04/18/2017  Job:  [id number]  cc:   Dr. __________

## 2017-04-18 NOTE — Anesthesia Postprocedure Evaluation (Signed)
Anesthesia Post Note  Patient: Glen [name]  Procedure(s) Performed: DIRECT LARYNGOSCOPY (N/A ) TRANSNASAL FIBER OPTIC LARYNGOSCOPY (N/A Throat)     Patient location during evaluation: PACU Anesthesia Type: General Level of consciousness: awake and alert Pain management: pain level controlled Vital Signs Assessment: post-procedure vital signs reviewed and stable Respiratory status: spontaneous breathing, nonlabored ventilation, respiratory function stable and patient connected to nasal cannula oxygen Cardiovascular status: blood pressure returned to baseline and stable Postop Assessment: no apparent nausea or vomiting Anesthetic complications: no    Last Vitals:  Vitals:   04/18/17 1334 04/18/17 1342  BP: 125/72 121/76  Pulse: (!) 59   Resp: 15   Temp:    SpO2: 95%     Last Pain:  Vitals:   04/18/17 0800  TempSrc: Axillary  PainSc:                  Jimmie Dattilio S

## 2017-04-18 NOTE — Anesthesia Preprocedure Evaluation (Signed)
Anesthesia Evaluation  Patient identified by MRN, date of birth, ID bandGeneral Assessment Comment:Patient intubated  Reviewed: Allergy & Precautions, NPO status , Patient's Chart, lab work & pertinent test results  Airway Mallampati: II  TM Distance: >3 FB Neck ROM: Full    Dental no notable dental hx.    Pulmonary neg pulmonary ROS,    Pulmonary exam normal breath sounds clear to auscultation       Cardiovascular negative cardio ROS Normal cardiovascular exam Rhythm:Regular Rate:Normal     Neuro/Psych Seizures -,  CVA negative psych ROS   GI/Hepatic negative GI ROS, Neg liver ROS,   Endo/Other  negative endocrine ROS  Renal/GU negative Renal ROS  negative genitourinary   Musculoskeletal negative musculoskeletal ROS (+)   Abdominal   Peds negative pediatric ROS (+)  Hematology negative hematology ROS (+)   Anesthesia Other Findings   Reproductive/Obstetrics negative OB ROS                             Anesthesia Physical Anesthesia Plan  ASA: III  Anesthesia Plan: General   Post-op Pain Management:    Induction: Inhalational  PONV Risk Score and Plan: 2 and Ondansetron  Airway Management Planned: Oral ETT  Additional Equipment:   Intra-op Plan:   Post-operative Plan: Post-operative intubation/ventilation  Informed Consent: I have reviewed the patients History and Physical, chart, labs and discussed the procedure including the risks, benefits and alternatives for the proposed anesthesia with the patient or authorized representative who has indicated his/her understanding and acceptance.   Dental advisory given  Plan Discussed with: CRNA and Surgeon  Anesthesia Plan Comments:         Anesthesia Quick Evaluation

## 2017-04-18 NOTE — Progress Notes (Signed)
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  Apparently fell 2 weeks ago hitting his head.  Complained of a headache to his mother on the morning of 3/24 while at church.  Additionally, he was recently changed from depakote to [place] due to hallucinations by Dr. Krista Blue who treats him for seizures.   There was concern for sepsis with initial presentation of low-grade fever and elevated WBC at [facility] and was treated with rocephin.  CT head showed a large subdural hematoma on the right.  He was transferred to [facility] ED, lethargic with temperature of 102.8, unknown baseline mental status, admitted to Neurosurgery and taken to OR for craniotomy and hematoma evacuation.  He was given Novoseven in OR as patient had been taking arixta.  ENT consulted in OR due to anesthesia team having difficulty passing ETT past glottis with inspiratory stridor, however remained easy to ventilate with bag/mask ventilation where a 7.0 ETT was placed using a Storz laryngoscope. Patient returns to ICU post-operatively and PCCM consulted for ventilator management and medical management.    SUBJECTIVE: Failed extubation on 3/28 due to stridor and remains intubated and mechanically ventilated.  He has not been able to move air around the 6.5 endotracheal tube nor have we been able to advance the tube which is  very high on the chest x-ray.  Considering that they encountered similar difficulties when the intubated him in the operating room, Dr. Pearline Cables asked ENT to evaluate the patient to determine whether he has a subglottic stenosis.  They evaluated him on 4/1 and are planning for direct laryngoscopy with probable tracheostomy today 4/2.    Had tremors / seizures like activity overnight 3/31.  EEG 4/1 performed and demonstrated diffuse nonspecific cerebral dysfunction.  CT also performed and demonstrated stable SDH.  MRI 4/2 demonstrated bilateral SDH's that are stable, post op changes, stable, old right and left sided infarcts, mild parenchymal brain volume loss, stable scattered chronic parenchymal hemorrhages.   VITAL SIGNS: BP 100/68   Pulse 61   Temp 98.5 F (36.9 C) (Axillary)   Resp 17   Ht 5\' 10"  (1.778 m)   Wt 85 kg (187 lb 6.3 oz)   SpO2 100%   BMI 26.89 kg/m   HEMODYNAMICS:    VENTILATOR SETTINGS: Vent Mode: PRVC FiO2 (%):  [40 %] 40 % Set Rate:  [12 bmp] 12 bmp Vt Set:  [580 mL] 580 mL PEEP:  [5 cmH20] 5 cmH20 Pressure Support:  [5 cmH20] 5 cmH20 Plateau Pressure:  [13 cmH20-20 cmH20] 14 cmH20  INTAKE / OUTPUT: I/O last 3 completed shifts: In: 1761 [I.V.:1850; NG/GT:1743] Out: 4025 [Urine:4025]  PHYSICAL EXAMINATION:  General: Intubated young male in no distress.   Neuro: Spontaneous eye opening, but not fully following commands this AM.  Pupils are equal.  Eyes track appropriately. Lungs: He is unlabored on a pressure support of 5.  There is symmetric air movement, CTAB. Abdomen: The abdomen is soft without organomegaly masses or tenderness.  Musculoskeletal: No dependent edema.  LABS:  BMET Recent Labs  Lab 04/15/17 0843 04/17/17 0248 04/18/17 0337  NA 140 136 135  K 3.9 4.3 4.5  CL 103 101 99*  CO2 26 26 26   BUN 13 17 22*  CREATININE 0.87 0.76 0.85  GLUCOSE 130* 142* 131*    Electrolytes Recent Labs  Lab 04/14/17 0440 04/15/17 0843 04/17/17 0248  04/18/17 0337  CALCIUM 8.7* 9.4 8.8* 8.8*  MG 2.0 2.2 2.2 2.3  PHOS 4.2 2.6  --  3.5    CBC Recent Labs  Lab 04/15/17 0843 04/17/17 0248 04/18/17 0337  WBC 21.4* 19.2* 17.1*  HGB 13.5 12.0* 12.5*  HCT 40.7 37.8* 37.9*  PLT 352 403* 391    Coag's No results for input(s): APTT, INR in the last 168 hours.  Sepsis Markers No results for input(s): LATICACIDVEN, PROCALCITON, O2SATVEN in the last 168 hours.  ABG Recent Labs  Lab 04/11/17 1530 04/13/17 2033  PHART 7.527* 7.410  PCO2ART 27.2* 45.3  PO2ART 186.0* 416.0*    Liver Enzymes No results for input(s): AST, ALT, ALKPHOS, BILITOT, ALBUMIN in the last 168 hours.  Cardiac Enzymes No results for input(s): TROPONINI, PROBNP in the last 168 hours.  Glucose Recent Labs  Lab 04/17/17 0836 04/17/17 1206 04/17/17 1615 04/17/17 2013 04/17/17 2355 04/18/17 0346  GLUCAP 143* 148* 155* 135* 149* 133*    Imaging Ct Head Wo Contrast  Result Date: 04/17/2017 CLINICAL DATA:  47 y/o  M; intracranial hemorrhage for follow-up. EXAM: CT HEAD WITHOUT CONTRAST TECHNIQUE: Contiguous axial images were obtained from the base of the skull through the vertex without intravenous contrast. COMPARISON:  04/14/2017 CT head.  08/19/2013 CT head. FINDINGS: Brain: Stable subdural hematomas over the right greater than left cerebral convexities and along the right tentorium with areas of retracted clot. Stable effacement of right frontal lobe sulci, partial effacement of frontal horn of right lateral ventricle, and minimal right-to-left midline shift of septum pellucidum. Stable densities within the left-greater-than-right globus palatine from 2015 compatible with dystrophic calcifications. Stable encephalomalacia within the left posteromedial frontal lobe, right parietal lobe, and inferior posterior cerebellum. Interval resolution of pneumocephalus. No new acute intracranial hemorrhage, stroke, or focal mass effect identified. No herniation. Stable  position of left frontal approach ventriculostomy catheter with tip in the suprasellar cistern traversing frontal horn of left lateral ventricle, foramen of Monro, and third ventricle. Vascular: No hyperdense vessel or unexpected calcification. Skull: Postsurgical changes related to chronic frontal and recent right parietal craniotomy are stable. Stable chronic suboccipital craniectomy postsurgical changes. Sinuses/Orbits: No acute finding. Other: None. IMPRESSION: 1. Stable right-greater-than-left subdural hematoma and associated mass effect with minimal right-to-left midline shift. 2. Stable areas of encephalomalacia in left frontal lobe, right parietal lobe, and posterior cerebellum. 3. No new acute intracranial abnormality identified. Electronically Signed   By: Kristine Garbe M.D.   On: 04/17/2017 18:31   Mr Jeri Cos KZ Contrast  Result Date: 04/18/2017 CLINICAL DATA:  Concern for recurrent seizures, speech difficulties. Status post subdural evacuation April 10, 2017. History of brain cancer and stroke. EXAM: MRI HEAD WITHOUT AND WITH CONTRAST TECHNIQUE: Multiplanar, multiecho pulse sequences of the brain and surrounding structures were obtained without and with intravenous contrast. CONTRAST:  44mL MULTIHANCE GADOBENATE DIMEGLUMINE 529 MG/ML IV SOLN COMPARISON:  CT HEAD April 17, 2017 and MRI of the head March 07, 2017 FINDINGS: Multiple sequences are moderately motion degraded. INTRACRANIAL CONTENTS: T1 bright subdural fluid collections LEFT frontal and LEFT parietal lobes measuring to 7  mm. RIGHT frontotemporal collection measuring to 16 mm. Falcotentorial subdural hematoma measuring to 6 mm. Regional mass effect without midline shift. Scattered foci of susceptibility artifact unchanged from prior imaging, possible treatment related. Old hemorrhage along LEFT frontal ventriculoperitoneal catheter. Mild parenchymal brain volume loss, ex vacuo dilatation RIGHT greater LEFT occipital horns. RIGHT  parietoccipital lobe encephalomalacia. LEFT mesial posterior frontal lobe encephalomalacia. Faint FLAIR T2 hyperintense signal bilateral basal ganglia. Severe cerebellar atrophy and inferior cerebellar encephalomalacia with ex vacuo dilatation fourth ventricle. VASCULAR: Normal major intracranial vascular flow voids present at skull base. SKULL AND UPPER CERVICAL SPINE: No abnormal sellar expansion. No suspicious calvarial bone marrow signal. Old suboccipital decompressive craniectomy. Old bilateral craniotomies. RIGHT scalp skin staples. Craniocervical junction maintained. SINUSES/ORBITS: Trace LEFT mastoid effusion. Trace maxillary sinus mucosal thickening.The included ocular globes and orbital contents are non-suspicious. OTHER: None. IMPRESSION: 1. No acute intracranial process, specifically no acute infarct. 2. Bilateral subacute subdural hematomas measuring to 16 mm on the RIGHT. Small subacute falcotentorial subdural hematoma. 3. Status post suboccipital craniotomy with severe cerebellar atrophy and encephalomalacia. 4. Old RIGHT parietoccipital and LEFT frontal lobe infarcts. 5. Mild parenchymal brain volume loss. LEFT ventriculoperitoneal shunt without hydrocephalus. 6. Stable scattered chronic parenchymal hemorrhages. Electronically Signed   By: Elon Alas M.D.   On: 04/18/2017 03:48   Dg Chest Port 1 View  Result Date: 04/18/2017 CLINICAL DATA:  Respiratory failure.  Ventilator support. EXAM: PORTABLE CHEST 1 VIEW COMPARISON:  04/17/2017 FINDINGS: Endotracheal tube tip is 3 cm above the carina. Nasogastric tube enters the abdomen. VP shunt tubing overlies the left chest. Lungs are well aerated. No collapse or consolidation. Mediastinal shadows are normal. IMPRESSION: Lines and tubes well positioned.  Lungs remain clear. Electronically Signed   By: Nelson Chimes M.D.   On: 04/18/2017 07:44    DISCUSSION:      This is a 47 year old with a prior CNS tumor followed by a medulloblastoma with VP  shunt placement who presented with altered mental status and a subdural hematoma.  The subdural was evacuated and he was subsequently suspected of having seizure activity with need  totransfer to ICU for control of seizures.  Of note he was a difficult intubation. Extubation was attempted afternoon of 3/28 but unfortunately was not successful due to stridor that was refractory to racemic epi and decadron.  He required re-intubation and had to use 6.5 ETT due to edematous cords.  There were similar difficulties with intubation in the operating room I have asked ENT to evaluate the patient for possible subglottic stenosis on 4/1.  ASSESSMENT / PLAN:  PULMONARY A:  He remains intubated after he failed extubation yesterday (3/28) due to stridor requiring re-intubation. Vocal cord edema. ? Subglottic stenosis - unable to move air around 6.5 ETT and unable to advance tube further. P: Continue vent support and continue daily SBT. Bronchial hygiene. Continue Decadron for now, frequency lowered to q12 hrs today 4/2 (from q6).  Continue to wean to off. ENT has seen pt, appreciate recs.  Planning for OR today for direct laryngoscopy and likely trach.  CARDIOVASCULAR A:  No acute issues. P: No interventions required.  RENAL A: Hyponatremia - corrected with isotonic fluid administration.  His plasma cortisol was only 5.4 3/29 which is potentially normal in a nonstressed situation.    P: Monitor BMP.  GASTROINTESTINAL A:  Stress ulcer prophylaxis. Nutrition. P: Continue Protonix Continue TF's  HEMATOLOGIC A:  VTE prophylaxis. P: He is on no chemical prophylaxis for DVTs due to his  CNS surgery, he is solely on SCDs.  ENDOCRINE A:  Hypothyroidism. P: Continue synthroid.  NEUROLOGIC A:  SDH s/p evacuation by neurosurgery (Dr. [name]). Seizures - had  tremors / possible facial twitching / seizures overnight 3/31.  EEG with generalized cerebral dysfunction. Sedation needs due to  mechanical ventilation. Hx CNS tumor followed by a medulloblastoma with VP shunt. P Post op care per neurosurgery. Continue Keppra 1500 BID per neurology. Neurology following. Ensure that his electrolytes are corrected to make sure that he does not have a metabolic substrate for ongoing seizures. Sedation: Propofol gtt / fentanyl PRN. RASS goal: 0 to -1. Daily WUA.  CC time: 30 min.   Montey Hora, [facility] Pager: [phone]  or [phone] 04/18/2017, 8:13 AM

## 2017-04-18 NOTE — Progress Notes (Signed)
Spoke with Aaron Edelman, RN (for Dr. [name]) regarding pt's Keppra and Decadron. Ok to change Keppra to IV as OG has been removed with extubation. Aaron Edelman will call back if tapering decadron (orders placed previously this morning by CCM) is not what Dr. [name] would like done.   Pt's mother called and has been updated regarding extubation.

## 2017-04-18 NOTE — Progress Notes (Signed)
RT note-Patient removed from ventilator, OR team in room preparing for transport.

## 2017-04-18 NOTE — Progress Notes (Signed)
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  Apparently fell 2 weeks ago hitting his head.   CT scan showed large SDH with minimal midline shift R greater than L side    PT Comments    Pt with good improvement in function between standing trials within session. Pt able to tolerate standing x 3 and did not attempt transfers OOB as pt with pending sx today and returned to bed. Pt with ETT maintained at 23cm at the lip throughout session. Pt not following commands to move extremities in bed for positioning or exercise but able to engage actively for standing. Will continue to follow.   PRVC 40%, peep 5 SpO2 100%   Follow Up Recommendations  CIR;Supervision/Assistance - 24 hour     Equipment Recommendations  None recommended by PT    Recommendations for Other Services       Precautions / Restrictions Precautions Precautions: Fall Precaution Comments: ETT 23cm    Mobility  Bed Mobility Overal bed mobility: Needs Assistance Bed Mobility: Supine to Sit;Sit to Supine     Supine to sit: Mod assist;+2 for physical assistance Sit to supine: Min assist   General bed mobility comments: assist to pivot to EOB with assist and guidance for lower body and trunk. mIn assist with guarding lines and tubes for return to bed  Transfers Overall transfer level: Needs assistance   Transfers: Sit to/from Stand Sit to Stand: Max assist;+2  physical assistance;Mod assist         General transfer comment: on initial trial pt max +2 to stand with assist for anterior translation and bil knees blocked, repeated 2 trials with mod assist +2 with pt able to maintain standing grossly 1 min on second trial with cues for anterior translation. With mod assist to advance bil LE pt able to side step toward [facility]  Ambulation/Gait                 Stairs            Wheelchair Mobility    Modified Rankin (Stroke Patients Only) Modified Rankin (Stroke Patients Only) Pre-Morbid Rankin Score: Moderate disability Modified Rankin: Severe disability     Balance Overall balance assessment: Needs assistance Sitting-balance support: Bilateral upper extremity supported Sitting balance-Leahy Scale: Poor Sitting balance - Comments: pt varied with mod-minguard assist with cues for anterior translation and safety Postural control: Posterior lean Standing balance support: Bilateral upper extremity supported Standing balance-Leahy Scale: Poor Standing balance comment: pt with cues for anterior translation to achieve midline                            Cognition Arousal/Alertness: Awake/alert Behavior During Therapy: Restless Overall Cognitive Status: Difficult to assess Area of Impairment: Following commands;Safety/judgement                       Following Commands: Follows one step commands inconsistently Safety/Judgement: Decreased awareness of deficits;Decreased awareness of safety            Exercises  General Comments        Pertinent Vitals/Pain Pain Assessment: Faces Pain Score: 3  Pain Location: pt pointing to ETT  Pain Intervention(s): Repositioned    Home Living                      Prior Function            PT Goals (current goals can now be found in the care plan section) Progress towards PT goals: Progressing toward goals    Frequency           PT Plan  Current plan remains appropriate    Co-evaluation              AM-PAC PT "6 Clicks" Daily Activity  Outcome Measure  Difficulty turning over in bed (including adjusting bedclothes, sheets and blankets)?: Unable Difficulty moving from lying on back to sitting on the side of the bed? : Unable Difficulty sitting down on and standing up from a chair with arms (e.g., wheelchair, bedside commode, etc,.)?: Unable Help needed moving to and from a bed to chair (including a wheelchair)?: A Lot Help needed walking in hospital room?: Total Help needed climbing 3-5 steps with a railing? : Total 6 Click Score: 7    End of Session Equipment Utilized During Treatment: Gait belt Activity Tolerance: Patient tolerated treatment well Patient left: in bed;with call bell/phone within reach;with restraints reapplied;with bed alarm set;with nursing/sitter in room Nurse Communication: Mobility status;Precautions PT Visit Diagnosis: Other abnormalities of gait and mobility (R26.89);Muscle weakness (generalized) (M62.81);Other symptoms and signs involving the nervous system (R29.898)     Time: 2563-8937 PT Time Calculation (min) (ACUTE ONLY): 26 min  Charges:  $Therapeutic Activity: 8-22 mins                    G Codes:       Elwyn Reach, PT [phone]    Rowen Wilmer B Meegan Shanafelt 04/18/2017, 12:06 PM

## 2017-04-18 NOTE — Transfer of Care (Signed)
Immediate Anesthesia Transfer of Care Note  Patient: JENNER ROSIER  Procedure(s) Performed: DIRECT LARYNGOSCOPY (N/A ) TRANSNASAL FIBER OPTIC LARYNGOSCOPY (N/A Throat)  Patient Location: PACU  Anesthesia Type:General  Level of Consciousness: drowsy  Airway & Oxygen Therapy: Patient Spontanous Breathing and Patient connected to face mask oxygen  Post-op Assessment: Report given to RN and Post -op Vital signs reviewed and stable  Post vital signs: Reviewed and stable  Last Vitals:  Vitals Value Taken Time  BP    Temp    Pulse 68 04/18/2017  7:56 PM  Resp 12 04/18/2017  7:56 PM  SpO2 100 % 04/18/2017  7:56 PM  Vitals shown include unvalidated device data.  Last Pain:  Vitals:   04/18/17 1600  TempSrc: Axillary  PainSc:          Complications: No apparent anesthesia complications

## 2017-04-18 NOTE — Evaluation (Signed)
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  Apparently fell 2 weeks ago hitting his head.   CT scan showed large SDH with minimal midline shift R greater than L side   Clinical Impression   Upon arrival, pt was supine in bed with vent in place and beating right hand on pillow to get someone's attention. Per chart review, pt living with his parents. Pt currently requiring Max A for ADLs due to decreased safety and Max A +2 for sit<>stand transfer from EOB. Pt presenting WFL strength for BUEs but with decreased motor control and coordination. Pt with inconsistent following of commands and required short, simple commands and increased time. Pt will require further acute OT to facilitate safe dc. Recommend dc to CIR for further intensive OT to optimize safety and independence with ADLs, decreased caregiver burden, and return to PLOF.    ETT maintained at 23cm Peep 5 SpO2 100%    Follow Up Recommendations  CIR;Supervision/Assistance - 24 hour    Equipment Recommendations  Other (comment)(Defer to next venu)    Recommendations for Other Services Rehab consult;PT consult;Speech consult     Precautions / Restrictions Precautions Precautions: Fall Precaution Comments: ETT 23cm Restrictions Weight Bearing Restrictions: No      Mobility Bed Mobility Overal bed mobility: Needs Assistance Bed Mobility: Supine to Sit;Sit to Supine      Supine to sit: Mod assist;+2 for physical assistance Sit to supine: Min assist   General bed mobility comments: assist to pivot to EOB with assist and guidance for lower body and trunk. mIn assist with guarding lines and tubes for return to bed  Transfers Overall transfer level: Needs assistance Equipment used: 2 person hand held assist Transfers: Sit to/from Stand Sit to Stand: Max assist;+2 physical assistance;Mod assist         General transfer comment: on initial trial pt max +2 to stand with assist for anterior translation and bil knees blocked, repeated 2 trials with mod assist +2 with pt able to maintain standing grossly 1 min on second trial with cues for anterior translation. With mod assist to advance bil LE pt able to side step toward [facility]    Balance Overall balance assessment: Needs assistance Sitting-balance support: Bilateral upper extremity supported Sitting balance-Leahy Scale: Poor Sitting balance - Comments: pt varied with mod-minguard assist with cues for anterior translation and safety Postural control: Posterior lean Standing balance support: Bilateral upper extremity supported Standing balance-Leahy Scale: Poor Standing balance comment: pt with cues for anterior translation to achieve midline                           ADL either performed or assessed with clinical judgement   ADL Overall ADL's : Needs assistance/impaired Eating/Feeding: NPO   Grooming: Maximal assistance;Cueing for sequencing;Sitting   Upper Body Bathing: Maximal assistance;Sitting   Lower Body Bathing: Maximal assistance;Bed level   Upper Body Dressing : Maximal assistance;Sitting   Lower Body Dressing: Maximal assistance;Bed level Lower Body Dressing Details (indicate cue type and reason): Donned socks  Functional mobility during ADLs: Moderate assistance;Maximal assistance;+2 for physical assistance(sit<>stand only) General ADL Comments: Due to  cognition and impulsivity, pt requiring Max A for ADLs for safety. Pt performing sit<>Stand transfer at EOB with Mod-Max A +2. Pt highly impulsive and restless. Difficulty following simple commands.      Vision         Perception     Praxis      Pertinent Vitals/Pain Pain Assessment: Faces Pain Score: 3  Faces Pain Scale: Hurts little more Pain Location: pt pointing to ETT  Pain Intervention(s): Repositioned;Monitored during session     Hand Dominance     Extremity/Trunk Assessment Upper Extremity Assessment Upper Extremity Assessment: Difficult to assess due to impaired cognition;RUE deficits/detail;LUE deficits/detail RUE Deficits / Details: WFL strength. Poor initation of movement and difficulty with coorindation. Pt with rapid movements and difficulty with motor planning and control. LUE Deficits / Details: WFL strength. Poor initation of movement and difficulty with coorindation. Pt with rapid movements and difficulty with motor planning and control.   Lower Extremity Assessment Lower Extremity Assessment: Defer to PT evaluation RLE Deficits / Details: moves LE spontaneously against gravity in bed.  bears full weight in standing.  movement is uncoordinated RLE Coordination: decreased fine motor LLE Deficits / Details: see R LE, L LE appears more uncoordinated and lower tone than R LE LLE Coordination: decreased fine motor;decreased gross motor       Communication Communication Communication: Other (comment)(Intubated)   Cognition Arousal/Alertness: Awake/alert Behavior During Therapy: Restless Overall Cognitive Status: Difficult to assess Area of Impairment: Following commands;Safety/judgement                       Following Commands: Follows one step commands inconsistently Safety/Judgement: Decreased awareness of deficits;Decreased awareness of safety     General Comments: Following simple commands inconsistantly   General Comments  VSS     Exercises     Shoulder Instructions      Home Living Family/patient expects to be discharged to:: Private residence Living Arrangements: Parent                               Additional Comments: Pt unable to provide PLOF and home set up      Prior Functioning/Environment Level of Independence: Needs assistance        Comments: No family present. Pt unable to provide further information on home set up and PLOF        OT Problem List: Decreased activity tolerance;Impaired balance (sitting and/or standing);Decreased cognition;Decreased coordination;Decreased safety awareness;Decreased knowledge of use of DME or AE;Decreased knowledge of precautions;Impaired UE functional use;Pain      OT Treatment/Interventions: Self-care/ADL training;Therapeutic exercise;Energy conservation;DME and/or AE instruction;Therapeutic activities;Patient/family education;Cognitive remediation/compensation    OT Goals(Current goals can be found in the care plan section) Acute Rehab OT Goals Patient Stated Goal: pt unable to participate with goals OT Goal Formulation: With patient Time For Goal Achievement: 05/02/17 Potential to Achieve Goals: Good  OT Frequency: Min 2X/week   Barriers to D/C:            Co-evaluation              AM-PAC PT "6 Clicks" Daily Activity     Outcome Measure Help from another person eating meals?: Total Help from another person taking care of personal grooming?: A Lot Help from another person toileting, which includes using toliet, bedpan, or urinal?: A Lot  Help from another person bathing (including washing, rinsing, drying)?: A Lot Help from another person to put on and taking off regular upper body clothing?: A Lot Help from another person to put on and taking off regular lower body clothing?: A Lot 6 Click Score: 11   End of Session Equipment Utilized During Treatment: Gait belt Nurse Communication: Mobility status  Activity Tolerance:  Patient tolerated treatment well Patient left: in bed;with call bell/phone within reach;with bed alarm set;with family/visitor present;with restraints reapplied  OT Visit Diagnosis: Unsteadiness on feet (R26.81);Other abnormalities of gait and mobility (R26.89);Muscle weakness (generalized) (M62.81);Other symptoms and signs involving cognitive function;Pain Pain - part of body: (pointing to vent)                Time: 1002-1027 OT Time Calculation (min): 25 min Charges:  OT General Charges $OT Visit: 1 Visit OT Evaluation $OT Eval Moderate Complexity: 1 Mod G-Codes:     [place], OTR/L Acute Rehab Pager: [phone] Office: [place] 04/18/2017, 2:10 PM

## 2017-04-18 NOTE — Progress Notes (Addendum)
Subjective: Patient reports intubated, startles, but not following commands.  Objective: Vital signs in last 24 hours: Temp:  [98.2 F (36.8 C)-98.9 F (37.2 C)] 98.5 F (36.9 C) (04/02 0400) Pulse Rate:  [49-100] 49 (04/02 0700) Resp:  [12-26] 12 (04/02 0700) BP: (100-149)/(65-97) 100/68 (04/02 0700) SpO2:  [91 %-100 %] 100 % (04/02 0700) FiO2 (%):  [40 %] 40 % (04/02 0354) Weight:  [85 kg (187 lb 6.3 oz)] 85 kg (187 lb 6.3 oz) (04/02 0416)  Intake/Output from previous day: 04/01 0701 - 04/02 0700 In: 2273 [I.V.:1250; NG/GT:1023] Out: 2925 [Urine:2925] Intake/Output this shift: No intake/output data recorded.  Physical Exam: Resting, intubated, startles.  MAE spontaneously.  Dressing CDI.  Lab Results: Recent Labs    04/17/17 0248 04/18/17 0337  WBC 19.2* 17.1*  HGB 12.0* 12.5*  HCT 37.8* 37.9*  PLT 403* 391   BMET Recent Labs    04/17/17 0248 04/18/17 0337  NA 136 135  K 4.3 4.5  CL 101 99*  CO2 26 26  GLUCOSE 142* 131*  BUN 17 22*  CREATININE 0.76 0.85  CALCIUM 8.8* 8.8*    Studies/Results: Ct Head Wo Contrast  Result Date: 04/17/2017 CLINICAL DATA:  47 y/o  M; intracranial hemorrhage for follow-up. EXAM: CT HEAD WITHOUT CONTRAST TECHNIQUE: Contiguous axial images were obtained from the base of the skull through the vertex without intravenous contrast. COMPARISON:  04/14/2017 CT head.  08/19/2013 CT head. FINDINGS: Brain: Stable subdural hematomas over the right greater than left cerebral convexities and along the right tentorium with areas of retracted clot. Stable effacement of right frontal lobe sulci, partial effacement of frontal horn of right lateral ventricle, and minimal right-to-left midline shift of septum pellucidum. Stable densities within the left-greater-than-right globus palatine from 2015 compatible with dystrophic calcifications. Stable encephalomalacia within the left posteromedial frontal lobe, right parietal lobe, and inferior posterior  cerebellum. Interval resolution of pneumocephalus. No new acute intracranial hemorrhage, stroke, or focal mass effect identified. No herniation. Stable position of left frontal approach ventriculostomy catheter with tip in the suprasellar cistern traversing frontal horn of left lateral ventricle, foramen of Monro, and third ventricle. Vascular: No hyperdense vessel or unexpected calcification. Skull: Postsurgical changes related to chronic frontal and recent right parietal craniotomy are stable. Stable chronic suboccipital craniectomy postsurgical changes. Sinuses/Orbits: No acute finding. Other: None. IMPRESSION: 1. Stable right-greater-than-left subdural hematoma and associated mass effect with minimal right-to-left midline shift. 2. Stable areas of encephalomalacia in left frontal lobe, right parietal lobe, and posterior cerebellum. 3. No new acute intracranial abnormality identified. Electronically Signed   By: Kristine Garbe M.D.   On: 04/17/2017 18:31   Mr Jeri Cos ZJ Contrast  Result Date: 04/18/2017 CLINICAL DATA:  Concern for recurrent seizures, speech difficulties. Status post subdural evacuation April 10, 2017. History of brain cancer and stroke. EXAM: MRI HEAD WITHOUT AND WITH CONTRAST TECHNIQUE: Multiplanar, multiecho pulse sequences of the brain and surrounding structures were obtained without and with intravenous contrast. CONTRAST:  76mL MULTIHANCE GADOBENATE DIMEGLUMINE 529 MG/ML IV SOLN COMPARISON:  CT HEAD April 17, 2017 and MRI of the head March 07, 2017 FINDINGS: Multiple sequences are moderately motion degraded. INTRACRANIAL CONTENTS: T1 bright subdural fluid collections LEFT frontal and LEFT parietal lobes measuring to 7 mm. RIGHT frontotemporal collection measuring to 16 mm. Falcotentorial subdural hematoma measuring to 6 mm. Regional mass effect without midline shift. Scattered foci of susceptibility artifact unchanged from prior imaging, possible treatment related. Old  hemorrhage along LEFT frontal ventriculoperitoneal catheter. Mild parenchymal  brain volume loss, ex vacuo dilatation RIGHT greater LEFT occipital horns. RIGHT parietoccipital lobe encephalomalacia. LEFT mesial posterior frontal lobe encephalomalacia. Faint FLAIR T2 hyperintense signal bilateral basal ganglia. Severe cerebellar atrophy and inferior cerebellar encephalomalacia with ex vacuo dilatation fourth ventricle. VASCULAR: Normal major intracranial vascular flow voids present at skull base. SKULL AND UPPER CERVICAL SPINE: No abnormal sellar expansion. No suspicious calvarial bone marrow signal. Old suboccipital decompressive craniectomy. Old bilateral craniotomies. RIGHT scalp skin staples. Craniocervical junction maintained. SINUSES/ORBITS: Trace LEFT mastoid effusion. Trace maxillary sinus mucosal thickening.The included ocular globes and orbital contents are non-suspicious. OTHER: None. IMPRESSION: 1. No acute intracranial process, specifically no acute infarct. 2. Bilateral subacute subdural hematomas measuring to 16 mm on the RIGHT. Small subacute falcotentorial subdural hematoma. 3. Status post suboccipital craniotomy with severe cerebellar atrophy and encephalomalacia. 4. Old RIGHT parietoccipital and LEFT frontal lobe infarcts. 5. Mild parenchymal brain volume loss. LEFT ventriculoperitoneal shunt without hydrocephalus. 6. Stable scattered chronic parenchymal hemorrhages. Electronically Signed   By: Elon Alas M.D.   On: 04/18/2017 03:48   Dg Chest Port 1 View  Result Date: 04/17/2017 CLINICAL DATA:  Shortness of breath. History of craniotomy for evacuation of a subdural hematoma 1 week ago. EXAM: PORTABLE CHEST 1 VIEW COMPARISON:  Chest x-ray of April 15, 2017 FINDINGS: The lungs are reasonably well inflated and clear. The heart and pulmonary vascularity are normal. The endotracheal tube tip projects 4.2 cm above the carina. The esophagogastric tube tip projects below the inferior margin of  the image likely in the distal stomach or proximal duodenum. A left-sided ventriculoperitoneal shunt tube is visible. IMPRESSION: No pneumonia nor CHF. The support tubes are in reasonable position. Electronically Signed   By: David  Martinique M.D.   On: 04/17/2017 08:06    Assessment/Plan: Continue support.  Trach today.  Imaging with both CT and MRI show persistent, but smaller SDH without shift.  No hydrocephalus or infarct.    LOS: 9 days    [name], MD 04/18/2017, 7:38 AM

## 2017-04-19 ENCOUNTER — Inpatient Hospital Stay (HOSPITAL_COMMUNITY): Payer: Medicare Other

## 2017-04-19 ENCOUNTER — Encounter (HOSPITAL_COMMUNITY): Payer: Self-pay | Admitting: Otolaryngology

## 2017-04-19 LAB — GLUCOSE, CAPILLARY
GLUCOSE-CAPILLARY: 101 mg/dL — AB (ref 65–99)
GLUCOSE-CAPILLARY: 109 mg/dL — AB (ref 65–99)
GLUCOSE-CAPILLARY: 94 mg/dL (ref 65–99)
GLUCOSE-CAPILLARY: 95 mg/dL (ref 65–99)
Glucose-Capillary: 120 mg/dL — ABNORMAL HIGH (ref 65–99)
Glucose-Capillary: 101 mg/dL — ABNORMAL HIGH (ref 65–99)

## 2017-04-19 LAB — BASIC METABOLIC PANEL
ANION GAP: 11 (ref 5–15)
BUN: 23 mg/dL — ABNORMAL HIGH (ref 6–20)
CALCIUM: 8.6 mg/dL — AB (ref 8.9–10.3)
CO2: 24 mmol/L (ref 22–32)
CREATININE: 0.84 mg/dL (ref 0.61–1.24)
Chloride: 98 mmol/L — ABNORMAL LOW (ref 101–111)
Glucose, Bld: 112 mg/dL — ABNORMAL HIGH (ref 65–99)
Potassium: 4.6 mmol/L (ref 3.5–5.1)
Sodium: 133 mmol/L — ABNORMAL LOW (ref 135–145)

## 2017-04-19 LAB — CBC
HCT: 41.3 % (ref 39.0–52.0)
HEMOGLOBIN: 14.2 g/dL (ref 13.0–17.0)
MCH: 30.2 pg (ref 26.0–34.0)
MCHC: 34.4 g/dL (ref 30.0–36.0)
MCV: 87.9 fL (ref 78.0–100.0)
PLATELETS: 391 10*3/uL (ref 150–400)
RBC: 4.7 MIL/uL (ref 4.22–5.81)
RDW: 13.8 % (ref 11.5–15.5)
WBC: 20 10*3/uL — ABNORMAL HIGH (ref 4.0–10.5)

## 2017-04-19 LAB — PHOSPHORUS: PHOSPHORUS: 4.4 mg/dL (ref 2.5–4.6)

## 2017-04-19 LAB — MAGNESIUM: Magnesium: 2.4 mg/dL (ref 1.7–2.4)

## 2017-04-19 MED ORDER — SODIUM CHLORIDE 0.9 % IV SOLN
100.0000 mg | Freq: Two times a day (BID) | INTRAVENOUS | Status: DC
  Administered 2017-04-19 – 2017-04-20 (×3): 100 mg via INTRAVENOUS
  Filled 2017-04-19 (×4): qty 10

## 2017-04-19 MED ORDER — SODIUM CHLORIDE 0.9 % IV SOLN
100.0000 mg | Freq: Once | INTRAVENOUS | Status: AC
  Administered 2017-04-19: 100 mg via INTRAVENOUS
  Filled 2017-04-19: qty 10

## 2017-04-19 MED ORDER — ENSURE ENLIVE PO LIQD
237.0000 mL | Freq: Two times a day (BID) | ORAL | Status: DC
  Administered 2017-04-21 – 2017-04-23 (×5): 237 mL via ORAL

## 2017-04-19 NOTE — Progress Notes (Signed)
Nutrition Follow-up  INTERVENTION:   Ensure Enlive po BID, each supplement provides 350 kcal and 20 grams of protein  D/C Vital AF 1.2 and Prostat  NUTRITION DIAGNOSIS:   Inadequate oral intake related to dysphagia as evidenced by (diet just advanced). Ongoing.   GOAL:   Patient will meet greater than or equal to 90% of their needs Progressing.  MONITOR:   PO intake, Supplement acceptance  ASSESSMENT:   Pt with PMH of glioblastoma s/p surgical removal 1993, autoimmune cerebritis, seizures, CVA, HIT who was admitted 3/24 from [place] with increased confusion x 2 days PTA. He fell and hit his head 2 weeks PTA. Tx to [facility], CT shows large R SDH now s/p crani with JP drain placement 3/25.   Spoke with RN and SLP. Diet advanced after MBS today Pt unable to answer any questions at this time  4/2 extubated 4/3 dysphagia diet advanced after MBS  Medications reviewed and include: miralax, synthroid Labs reviewed: Na 133 (L)    Diet Order:  DIET - DYS 1 Room service appropriate? Yes; Fluid consistency: Thin  EDUCATION NEEDS:   No education needs have been identified at this time  Skin:  Skin Assessment: (head incision)  Last BM:  3/31  Height:   Ht Readings from Last 1 Encounters:  04/13/17 5\' 10"  (1.778 m)    Weight:   Wt Readings from Last 1 Encounters:  04/19/17 189 lb 2.5 oz (85.8 kg)    Ideal Body Weight:  75.4 kg  BMI:  Body mass index is 27.14 kg/m.  Estimated Nutritional Needs:   Kcal:  2000-2300  Protein:  100-115 grams  Fluid:  > 2 L/day  Maylon Peppers RD, LDN, CNSC [phone] Pager [phone] After Hours Pager

## 2017-04-19 NOTE — Progress Notes (Signed)
Modified Barium Swallow Progress Note  Patient Details  Name: MAKYA YURKO MRN: [id number] Date of Birth: [birth year]  Today's Date: 04/19/2017  Modified Barium Swallow completed.  Full report located under Chart Review in the Imaging Section.  Brief recommendations include the following:  Clinical Impression  Pt presents with a primary oral dysphagia marked by impaired oral motor planning with decreased initiation of lip seal, decreased coordination of bolus control, leading to diffuse spillage of material within oral cavity.  There is delayed preparation of solid materials for swallow.  Once bolus material reaches pharynx, a swallow is triggered with reliable/consistent airway protection, adequate pharyngeal contraction, no residue post-swallow.  There were no incidents of penetration nor aspiration. Recommend starting a dysphagia 1 diet with thin liquids; meds whole in puree.  Encourage self-feeding as much as able with 1:1 assist as needed.  SLP will follow for safety/diet progression.    Please order speech/language evaluation.     Swallow Evaluation Recommendations       SLP Diet Recommendations: Dysphagia 1 (Puree) solids;Thin liquid   Liquid Administration via: Cup   Medication Administration: Whole meds with puree   Supervision: Full assist for feeding   Compensations: Small sips/bites;Minimize environmental distractions       Oral Care Recommendations: Oral care BID        Juan Quam Laurice 04/19/2017,2:56 PM

## 2017-04-19 NOTE — Progress Notes (Addendum)
Subjective: Patient reports "I ...had...brain...surgery"  Objective: Vital signs in last 24 hours: Temp:  [97 F (36.1 C)-98.3 F (36.8 C)] 97.6 F (36.4 C) (04/03 0400) Pulse Rate:  [59-87] 65 (04/03 0700) Resp:  [11-27] 16 (04/03 0700) BP: (112-146)/(70-112) 116/74 (04/03 0700) SpO2:  [73 %-100 %] 98 % (04/03 0700) FiO2 (%):  [40 %] 40 % (04/02 1200) Weight:  [85.8 kg (189 lb 2.5 oz)] 85.8 kg (189 lb 2.5 oz) (04/03 0500)  Intake/Output from previous day: 04/02 0701 - 04/03 0700 In: 1635 [I.V.:1500; IV Piggyback:135] Out: 2380 [Urine:2380] Intake/Output this shift: No intake/output data recorded.  Now extubated. Resting quietly, twitches right hand and eyelids. Opens eyes to touch and follows. Given significant time, he states "I had brain surgery". Other vocalizations unintelligible. Taps chest, abdomen, and face when frustrated with speech - unsure of his communication techniques with mom prior to this SDH.   Lab Results: Recent Labs    04/18/17 0337 04/19/17 0344  WBC 17.1* 20.0*  HGB 12.5* 14.2  HCT 37.9* 41.3  PLT 391 391   BMET Recent Labs    04/18/17 0337 04/19/17 0344  NA 135 133*  K 4.5 4.6  CL 99* 98*  CO2 26 24  GLUCOSE 131* 112*  BUN 22* 23*  CREATININE 0.85 0.84  CALCIUM 8.8* 8.6*    Studies/Results: Ct Head Wo Contrast  Result Date: 04/17/2017 CLINICAL DATA:  47 y/o  M; intracranial hemorrhage for follow-up. EXAM: CT HEAD WITHOUT CONTRAST TECHNIQUE: Contiguous axial images were obtained from the base of the skull through the vertex without intravenous contrast. COMPARISON:  04/14/2017 CT head.  08/19/2013 CT head. FINDINGS: Brain: Stable subdural hematomas over the right greater than left cerebral convexities and along the right tentorium with areas of retracted clot. Stable effacement of right frontal lobe sulci, partial effacement of frontal horn of right lateral ventricle, and minimal right-to-left midline shift of septum pellucidum. Stable  densities within the left-greater-than-right globus palatine from 2015 compatible with dystrophic calcifications. Stable encephalomalacia within the left posteromedial frontal lobe, right parietal lobe, and inferior posterior cerebellum. Interval resolution of pneumocephalus. No new acute intracranial hemorrhage, stroke, or focal mass effect identified. No herniation. Stable position of left frontal approach ventriculostomy catheter with tip in the suprasellar cistern traversing frontal horn of left lateral ventricle, foramen of Monro, and third ventricle. Vascular: No hyperdense vessel or unexpected calcification. Skull: Postsurgical changes related to chronic frontal and recent right parietal craniotomy are stable. Stable chronic suboccipital craniectomy postsurgical changes. Sinuses/Orbits: No acute finding. Other: None. IMPRESSION: 1. Stable right-greater-than-left subdural hematoma and associated mass effect with minimal right-to-left midline shift. 2. Stable areas of encephalomalacia in left frontal lobe, right parietal lobe, and posterior cerebellum. 3. No new acute intracranial abnormality identified. Electronically Signed   By: Kristine Garbe M.D.   On: 04/17/2017 18:31   Mr Jeri Cos QI Contrast  Result Date: 04/18/2017 CLINICAL DATA:  Concern for recurrent seizures, speech difficulties. Status post subdural evacuation April 10, 2017. History of brain cancer and stroke. EXAM: MRI HEAD WITHOUT AND WITH CONTRAST TECHNIQUE: Multiplanar, multiecho pulse sequences of the brain and surrounding structures were obtained without and with intravenous contrast. CONTRAST:  53mL MULTIHANCE GADOBENATE DIMEGLUMINE 529 MG/ML IV SOLN COMPARISON:  CT HEAD April 17, 2017 and MRI of the head March 07, 2017 FINDINGS: Multiple sequences are moderately motion degraded. INTRACRANIAL CONTENTS: T1 bright subdural fluid collections LEFT frontal and LEFT parietal lobes measuring to 7 mm. RIGHT frontotemporal collection  measuring to 16  mm. Falcotentorial subdural hematoma measuring to 6 mm. Regional mass effect without midline shift. Scattered foci of susceptibility artifact unchanged from prior imaging, possible treatment related. Old hemorrhage along LEFT frontal ventriculoperitoneal catheter. Mild parenchymal brain volume loss, ex vacuo dilatation RIGHT greater LEFT occipital horns. RIGHT parietoccipital lobe encephalomalacia. LEFT mesial posterior frontal lobe encephalomalacia. Faint FLAIR T2 hyperintense signal bilateral basal ganglia. Severe cerebellar atrophy and inferior cerebellar encephalomalacia with ex vacuo dilatation fourth ventricle. VASCULAR: Normal major intracranial vascular flow voids present at skull base. SKULL AND UPPER CERVICAL SPINE: No abnormal sellar expansion. No suspicious calvarial bone marrow signal. Old suboccipital decompressive craniectomy. Old bilateral craniotomies. RIGHT scalp skin staples. Craniocervical junction maintained. SINUSES/ORBITS: Trace LEFT mastoid effusion. Trace maxillary sinus mucosal thickening.The included ocular globes and orbital contents are non-suspicious. OTHER: None. IMPRESSION: 1. No acute intracranial process, specifically no acute infarct. 2. Bilateral subacute subdural hematomas measuring to 16 mm on the RIGHT. Small subacute falcotentorial subdural hematoma. 3. Status post suboccipital craniotomy with severe cerebellar atrophy and encephalomalacia. 4. Old RIGHT parietoccipital and LEFT frontal lobe infarcts. 5. Mild parenchymal brain volume loss. LEFT ventriculoperitoneal shunt without hydrocephalus. 6. Stable scattered chronic parenchymal hemorrhages. Electronically Signed   By: Elon Alas M.D.   On: 04/18/2017 03:48   Dg Chest Port 1 View  Result Date: 04/18/2017 CLINICAL DATA:  Respiratory failure.  Ventilator support. EXAM: PORTABLE CHEST 1 VIEW COMPARISON:  04/17/2017 FINDINGS: Endotracheal tube tip is 3 cm above the carina. Nasogastric tube enters the  abdomen. VP shunt tubing overlies the left chest. Lungs are well aerated. No collapse or consolidation. Mediastinal shadows are normal. IMPRESSION: Lines and tubes well positioned.  Lungs remain clear. Electronically Signed   By: Nelson Chimes M.D.   On: 04/18/2017 07:44    Assessment/Plan:   LOS: 10 days  Continue suppoprt   Verdis Prime 04/19/2017, 8:03 AM   Patient extubated and verbal.

## 2017-04-19 NOTE — Evaluation (Signed)
Clinical/Bedside Swallow Evaluation Patient Details  Name: Glen [name] MRN: [id number] Date of Birth: 13-Nov-1970  Today's Date: 04/19/2017 Time: SLP Start Time (ACUTE ONLY): 1200 SLP Stop Time (ACUTE ONLY): 1230 SLP Time Calculation (min) (ACUTE ONLY): 30 min  Past Medical History:  Past Medical History:  Diagnosis Date  . Brain cancer ([place])   . Hypercholesteremia   . Seizure ([place])   . Stroke ([place])   . Thyroid disorder    Past Surgical History:  Past Surgical History:  Procedure Laterality Date  . APPENDECTOMY     1980s  . BRAIN SURGERY     1993  . CRANIOTOMY Right 04/09/2017   Procedure: CRANIOTOMY HEMATOMA EVACUATION SUBDURAL;  Surgeon: Erline Levine, MD;  Location: [facility];  Service: Neurosurgery;  Laterality: Right;  . DIRECT LARYNGOSCOPY N/A 04/09/2017   Procedure: DIRECT LARYNGOSCOPY;  Surgeon: Erline Levine, MD;  Location: [facility];  Service: Neurosurgery;  Laterality: N/A;  . DIRECT LARYNGOSCOPY N/A 04/09/2017   Procedure: DIRECT LARYNGOSCOPY;  Surgeon: [name], MD;  Location: [facility];  Service: ENT;  Laterality: N/A;  . DIRECT LARYNGOSCOPY N/A 04/18/2017   Procedure: DIRECT LARYNGOSCOPY;  Surgeon: [name], MD;  Location: [facility];  Service: ENT;  Laterality: N/A;  . 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 Intubated 3/24-28; reintubated 3/28 due to stridor, notes indicate difficult intubation.  Extubated 4/2. ENT direct laryngoscopy 4/2 revealed:  "inflammatory changes of the posterior glottis as expected, small nodular granulation at the anterior tracheal wall at the site of the previous tracheostomy and some curvature to the right of the trachea.  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  Unable to seal lips around spoon or cup, but opened mouth to receive ice chips and water.  Pt initiated a swallow response, which elicited an explosive cough in reaction to water.  Pt self-fed with hand-over-hand assist.  Recommend proceeding with MBS to determine safest potential PO diet .  D/W RN.  Scheduled for 2 pm today.  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 Intubated 3/24-28; reintubated 3/28 due to stridor, notes indicate difficult intubation.  Extubated 4/2. ENT direct laryngoscopy 4/2 revealed: "inflammatory changes of the posterior glottis as expected, small nodular granulation at the anterior tracheal wall at the site of the previous tracheostomy and some curvature to the right of the trachea.  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 Diet Prior to this Study: NPO Temperature Spikes Noted: No Respiratory Status: Room air History of Recent Intubation: Yes Length of Intubations (days): 5 days Date extubated: 04/18/17 Behavior/Cognition: Alert Oral Cavity Assessment: Within Functional Limits Oral Care Completed by SLP: No Oral Cavity - Dentition: Missing dentition Self-Feeding Abilities: Needs assist Patient Positioning: Upright in bed Baseline Vocal Quality: Hoarse Volitional Cough: Cognitively unable to elicit Volitional Swallow: Unable to elicit    Oral/Motor/Sensory Function Overall Oral Motor/Sensory Function: Generalized oral weakness Facial Symmetry: Within Functional Limits   Ice Chips Ice chips: Impaired Oral Phase Impairments: Impaired mastication   Thin Liquid Thin Liquid: Impaired Presentation: Cup;Self Fed Oral Phase Impairments: Reduced labial seal Oral Phase  Functional Implications: Right anterior spillage;Left anterior spillage Pharyngeal  Phase Impairments: Suspected delayed Swallow;Cough - Immediate    Nectar Thick Nectar Thick Liquid: Not tested   Honey Thick Honey Thick Liquid: Not tested   Puree Puree: Not tested   Solid   GO   Solid: Not tested        Juan Quam Laurice 04/19/2017,12:40 PM

## 2017-04-19 NOTE — Progress Notes (Signed)
Patient had seizure like activity for 3 minutes.  Paged neurology MD.  Orders given for Vimpat IVPB to be administered.

## 2017-04-19 NOTE — Progress Notes (Signed)
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  On 3/24 complained of headache.  Found to have large right SDH with low grade fever and elevated WBC.  [facility]red [facility] and taken for craniotomy with hematoma evacuation.  ENT consulted in OR for difficult airway.    Failed extubation on 3/28 due to stridor and remains intubated and mechanically ventilated.  He has not been able to move air around the 6.5 endotracheal tube nor have we been able to advance the tube which is very high on the chest x-ray.  Considering that they encountered similar difficulties when the intubated him in the operating room, Dr. Pearline Cables asked ENT to evaluate the patient to determine whether he has a subglottic stenosis on 4/1 with plans for direct laryngoscopy and possible trach.   Had tremors / seizures like activity overnight 3/31.  EEG 4/1 performed and demonstrated diffuse nonspecific cerebral dysfunction.  CT also performed and demonstrated stable SDH.  MRI 4/2 demonstrated bilateral SDH's that are stable, post op changes, stable, old right and left sided infarcts, mild parenchymal brain volume loss, stable scattered chronic parenchymal hemorrhages. 4/2 taken to OR and successfully extubated by ENT.  SUBJECTIVE: Overnight, 2-3 mins of facial twitching noted without other neuro  changes treated with vimpat 100mg  IV x 1 Remains on 2L 100% [place]  VITAL SIGNS: BP 116/74   Pulse 65   Temp 97.6 F (36.4 C) (Axillary)   Resp 16   Ht 5\' 10"  (1.778 m)   Wt 189 lb 2.5 oz (85.8 kg)   SpO2 98%   BMI 27.14 kg/m   HEMODYNAMICS:    VENTILATOR SETTINGS:  INTAKE / OUTPUT: I/O last 3 completed shifts: In: 2588 [I.V.:2150; NG/GT:303; IV Piggyback:135] Out: 3780 [Urine:3780]  PHYSICAL EXAMINATION:  General:  Chronically ill appearing male lying in bed in NAD HEENT: MM pink/moist, pupils 3/reactive, crani site intact with staples, no stridor, vocalization/moans strong qualilty Neuro: Awake, tracks with eyes, does not follow commands, moving all extremities CV: s1s2 rrr, no m/r/g PULM: even/shallow, lungs bilaterally clear, 100% on room air on my exam QM:GQQP, non-tender, bs active  Extremities: warm/dry, no edema  Skin: no rashes   LABS:  BMET Recent Labs  Lab 04/17/17 0248 04/18/17 0337 04/19/17 0344  NA 136 135 133*  K 4.3 4.5 4.6  CL 101 99* 98*  CO2 26 26 24   BUN 17 22* 23*  CREATININE 0.76 0.85 0.84  GLUCOSE 142* 131* 112*    Electrolytes Recent Labs  Lab 04/15/17 0843 04/17/17 0248 04/18/17 0337 04/19/17 0344  CALCIUM 9.4 8.8* 8.8* 8.6*  MG 2.2 2.2 2.3 2.4  PHOS 2.6  --  3.5 4.4    CBC Recent Labs  Lab 04/17/17 0248 04/18/17 0337 04/19/17 0344  WBC 19.2* 17.1* 20.0*  HGB 12.0* 12.5* 14.2  HCT 37.8* 37.9* 41.3  PLT 403* 391 391    Coag's No results for input(s): APTT, INR in the last 168 hours.  Sepsis Markers No results for input(s): LATICACIDVEN, PROCALCITON, O2SATVEN in the last 168 hours.  ABG Recent Labs  Lab 04/13/17 2033 04/18/17 1635  PHART 7.410 7.468*  PCO2ART 45.3 42.9  PO2ART 416.0* 199.0*    Liver Enzymes No results for input(s): AST, ALT, ALKPHOS, BILITOT, ALBUMIN in the last 168 hours.  Cardiac Enzymes No results for input(s): TROPONINI, PROBNP in the last 168 hours.  Glucose Recent Labs  Lab  04/18/17 0346 04/18/17 0836 04/18/17 1558 04/18/17 2007 04/18/17 2344 04/19/17 0340  GLUCAP 133* 123* 92 103* 103* 120*    Imaging No results found.  DISCUSSION:      This is a 47 year old with a prior CNS tumor followed by a medulloblastoma with VP shunt placement who presented with altered mental status and a subdural hematoma.  The subdural was evacuated and he was subsequently suspected of having seizure activity with need  totransfer to ICU for control of seizures.  Of note he was a difficult intubation. Extubation was attempted afternoon of 3/28 but unfortunately was not successful due to stridor that was refractory to racemic epi and decadron.  He required re-intubation and had to use 6.5 ETT due to edematous cords.  ENT re-consulted 4/1 and taken to the OR on 4/2 for successful extubation.  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 P: No interventions required.  RENAL A: Hyponatremia -    P: NS at 50 ml/hr Monitor BMP.  GASTROINTESTINAL A:  NPO Nutrition. P: D/c PPI for SUP SLP eval today, if fails will need need cortrak placed   HEMATOLOGIC A:  VTE prophylaxis. P: SCDs only given CNS surgery  ENDOCRINE A:  Hypothyroidism. P: Continue synthroid CBG q 4 while NPO  NEUROLOGIC A:  SDH s/p evacuation by neurosurgery (Dr. [name]). Seizures - had  tremors / possible facial twitching / seizures overnight 3/31/ EEG with generalized cerebral dysfunction.  Facial twitching again noted 4/2. Hx CNS tumor followed by a medulloblastoma with VP shunt. P Post op care per neurosurgery. Continue Keppra 1500 BID per  neurology. Neurology following. PT/OT evaluations   Nothing further to add.  PCCM will sign off.  Please do not hesitate to call us back if we can be of any further assistance.  CC time: 30 mins  Kennieth Rad, AGACNP-BC  Pulmonary & Critical Care Pgr: [phone] or if no answer [phone] 04/19/2017, 8:19 AM

## 2017-04-19 NOTE — Progress Notes (Signed)
RN called for possible facial twitching 2-3 min. No change in exam per nursing. On Keppra 1500 BID. Given one dose Vimpat 100mg  IV x1. Decision to continue second AED per neurology team after rounds.  -- Amie Portland, MD [facility] Pager: [phone] If 7pm to 7am, please call on call as listed on AMION.

## 2017-04-19 NOTE — Progress Notes (Signed)
Subjective: Extubated  Exam: Vitals:   04/19/17 1000 04/19/17 1100  BP: 112/76 121/72  Pulse:  64  Resp:  14  Temp:    SpO2:  100%   Gen: In bed, NAD Resp: non-labored breathing, no acute distress Abd: soft, nt   Neuro: MS: Awake, alert, engages with the examiner but does not follow commands. CN: Appears to blink to threat bilaterally. Blinks symmetrically, crossess midline in both directions, though doe snot fully track me to either side.  Motor: He moves all extremities with good strength Sensory: Localizes bilaterally  Impression: 47 year old male with subdural hematoma and concern for recurrent partial seizures.  He apparently had some EEG negative abnormal movements earlier in the hospitalization, and therefore I think it is less clear if these were recurrent seizures.  He is at high risk, however, and therefore I think that adding a second agent is reasonable.   Recommendations: 1) Continue Keppra 2) Vimpat 100mg  BID added 3) Will follow.   Roland Rack, MD [facility] [phone]  If 7pm- 7am, please page neurology on call as listed in [place].

## 2017-04-20 ENCOUNTER — Encounter (HOSPITAL_COMMUNITY): Payer: Self-pay

## 2017-04-20 ENCOUNTER — Inpatient Hospital Stay (HOSPITAL_COMMUNITY): Payer: Medicare Other

## 2017-04-20 DIAGNOSIS — D72829 Elevated white blood cell count, unspecified: Secondary | ICD-10-CM

## 2017-04-20 DIAGNOSIS — S06303S Unspecified focal traumatic brain injury with loss of consciousness of 1 hour to 5 hours 59 minutes, sequela: Secondary | ICD-10-CM

## 2017-04-20 LAB — GLUCOSE, CAPILLARY
GLUCOSE-CAPILLARY: 62 mg/dL — AB (ref 65–99)
GLUCOSE-CAPILLARY: 77 mg/dL (ref 65–99)
Glucose-Capillary: 95 mg/dL (ref 65–99)
Glucose-Capillary: 89 mg/dL (ref 65–99)
Glucose-Capillary: 81 mg/dL (ref 65–99)

## 2017-04-20 MED ORDER — SODIUM CHLORIDE 0.9 % IV BOLUS
500.0000 mL | INTRAVENOUS | Status: AC
  Administered 2017-04-20: 500 mL via INTRAVENOUS

## 2017-04-20 MED ORDER — SODIUM CHLORIDE 0.9 % IV BOLUS
500.0000 mL | Freq: Once | INTRAVENOUS | Status: AC
  Administered 2017-04-20: 500 mL via INTRAVENOUS

## 2017-04-20 MED ORDER — LORAZEPAM 2 MG/ML IJ SOLN
1.0000 mg | Freq: Once | INTRAMUSCULAR | Status: AC
  Administered 2017-04-20: 1 mg via INTRAVENOUS

## 2017-04-20 MED ORDER — DEXTROSE-NACL 5-0.9 % IV SOLN
INTRAVENOUS | Status: DC
  Administered 2017-04-20 – 2017-04-22 (×5): via INTRAVENOUS

## 2017-04-20 MED ORDER — VALPROATE SODIUM 500 MG/5ML IV SOLN
2000.0000 mg | INTRAVENOUS | Status: AC
  Administered 2017-04-20: 2000 mg via INTRAVENOUS
  Filled 2017-04-20: qty 20

## 2017-04-20 MED ORDER — LORAZEPAM 2 MG/ML IJ SOLN: 1.0000 mg | Freq: Once | INTRAMUSCULAR | Status: AC

## 2017-04-20 MED ORDER — LORAZEPAM 2 MG/ML IJ SOLN
INTRAMUSCULAR | Status: AC
  Administered 2017-04-20: 1 mg via INTRAVENOUS
  Filled 2017-04-20: qty 1

## 2017-04-20 MED ORDER — SODIUM CHLORIDE 0.9 % IV SOLN
100.0000 mg | INTRAVENOUS | Status: AC
  Administered 2017-04-20: 100 mg via INTRAVENOUS
  Filled 2017-04-20: qty 10

## 2017-04-20 MED ORDER — SODIUM CHLORIDE 0.9 % IV BOLUS
500.0000 mL | Freq: Once | INTRAVENOUS | Status: AC
  Administered 2017-04-21: via INTRAVENOUS

## 2017-04-20 MED ORDER — SODIUM CHLORIDE 0.9 % IV SOLN
200.0000 mg | Freq: Two times a day (BID) | INTRAVENOUS | Status: DC
  Administered 2017-04-20 – 2017-04-23 (×6): 200 mg via INTRAVENOUS
  Filled 2017-04-20 (×9): qty 20

## 2017-04-20 MED ORDER — VALPROATE SODIUM 500 MG/5ML IV SOLN
500.0000 mg | Freq: Three times a day (TID) | INTRAVENOUS | Status: DC
  Administered 2017-04-20 – 2017-04-21 (×2): 500 mg via INTRAVENOUS
  Filled 2017-04-20 (×4): qty 5

## 2017-04-20 NOTE — Progress Notes (Signed)
  Speech Language Pathology Treatment: Dysphagia  Patient Details Name: Glen [name] MRN: [id number] DOB: 1970/05/22 Today's Date: 04/20/2017 Time: 1791-5056 SLP Time Calculation (min) (ACUTE ONLY): 9 min  Assessment / Plan / Recommendation Clinical Impression  Pt presents with reduced oral acceptance of therapeutic PO trials consistent with current diet recommendations despite Max verbal, visual, sensory and tactile cues. Per RN pt had not consumed PO yet this day. Of note, pt had anterior spillage of saliva from the right corner of his mouth, indicating reduced manipulation and management of secretions compared to previous session. Recommend continuing current diet recommendations of Dys 1 (puree) and thin liquids and aspiration precautions, full supervision to ensure safety and provide 1:1 assist as needed. Would only attempt PO trials when pt is accepting. SLP will continue to follow up for safety and potential to advance solid textures.    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 Intubated 3/24-28; reintubated 3/28 due to stridor, notes indicate difficult intubation.  Extubated 4/2. ENT direct laryngoscopy 4/2 revealed: "inflammatory changes of the posterior glottis as expected, small nodular granulation at the anterior tracheal wall at the site of the previous tracheostomy and some curvature to the right of the trachea.  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, since his diagnosis of  autoimmune cerebritis, he has significant decline in his functional status, with rehabilitation, he was eventually able to ambulate with walker, still with very unsteady ataxic gait, loss of hearing, slurred speech, difficulty reading, he had regained significant recovery from 2016, was able to begin to read his Bible again      SLP Plan  Continue with current plan of care      Recommendations  Diet recommendations: Dysphagia 1 (puree);Thin liquid Liquids provided via: Cup Medication Administration: Whole meds with puree Supervision: Staff to assist with self feeding;Full supervision/cueing for compensatory strategies Compensations: Small sips/bites;Minimize environmental distractions Postural Changes and/or Swallow Maneuvers: Seated upright 90 degrees                Oral Care Recommendations: Oral care BID Follow up Recommendations: Inpatient Rehab SLP Visit Diagnosis: Dysphagia, oropharyngeal phase (R13.12) Plan: Continue with current plan of care       GO              Glen [name] SLP Student Clinician'  Glen [name] 04/20/2017, 1:48 PM

## 2017-04-20 NOTE — Progress Notes (Signed)
Hypoglycemic Event  CBG: 62  Treatment: D5 in NS at 100cc/hr  Symptoms: None    Possible Reasons for Event: Inadequate meal intake r/t sedation post-seizure activity  Comments/MD notified: Dr. Katheran Awe, Roselyn Reef P

## 2017-04-20 NOTE — Progress Notes (Addendum)
Paged [facility] on call MD regarding Hypotension.  500 ml bolus ordered.  Hypotension persisted so paged MD again.  MD came and assessed patient, ordered Lactic Acid lab, and another 500 mL bolus.

## 2017-04-20 NOTE — Progress Notes (Signed)
Subjective: Patient reports nonverbal this morning  Objective: Vital signs in last 24 hours: Temp:  [97.5 F (36.4 C)-98.7 F (37.1 C)] 98.7 F (37.1 C) (04/04 0826) Pulse Rate:  [63-88] 73 (04/04 0800) Resp:  [0-24] 19 (04/04 0800) BP: (89-171)/(59-105) 89/59 (04/04 0800) SpO2:  [93 %-100 %] 96 % (04/04 0800) Weight:  [83.6 kg (184 lb 4.9 oz)] 83.6 kg (184 lb 4.9 oz) (04/04 0400)  Intake/Output from previous day: 04/03 0701 - 04/04 0700 In: 1560 [P.O.:90; I.V.:1200; IV Piggyback:270] Out: 2000 [Urine:2000] Intake/Output this shift: Total I/O In: 50 [I.V.:50] Out: -   Awake, not following commands with this visit, nonverbal. PEARL Scalp incision well-approximated, without erythema or drainage.   Lab Results: Recent Labs    04/18/17 0337 04/19/17 0344  WBC 17.1* 20.0*  HGB 12.5* 14.2  HCT 37.9* 41.3  PLT 391 391   BMET Recent Labs    04/18/17 0337 04/19/17 0344  NA 135 133*  K 4.5 4.6  CL 99* 98*  CO2 26 24  GLUCOSE 131* 112*  BUN 22* 23*  CREATININE 0.85 0.84  CALCIUM 8.8* 8.6*    Studies/Results: Dg Chest Port 1 View  Result Date: 04/19/2017 CLINICAL DATA:  Status post CVA, seizure; history of brain malignancy. EXAM: PORTABLE CHEST 1 VIEW COMPARISON:  Chest x-ray of April 18, 2017 FINDINGS: There has been interval extubation of the trachea and esophagus. The lungs are reasonably well expanded and clear. The heart and pulmonary vascularity are normal. There is no pleural effusion. A ventriculoperitoneal shunt tube is present to the left of midline. IMPRESSION: No active cardiopulmonary disease. Interval extubation of the trachea and esophagus. Electronically Signed   By: David  Martinique M.D.   On: 04/19/2017 09:36   Dg Swallowing Func-speech Pathology  Result Date: 04/19/2017 Objective Swallowing Evaluation: Type of Study: MBS-Modified Barium Swallow Study  Patient Details Name: Glen [name] MRN: [id number] Date of Birth: 1970-08-25 Today's Date: 04/19/2017 Time:  SLP Start Time (ACUTE ONLY): 8341 -SLP Stop Time (ACUTE ONLY): 1425 SLP Time Calculation (min) (ACUTE ONLY): 30 min Past Medical History: Past Medical History: Diagnosis Date . Brain cancer ([place])  . Hypercholesteremia  . Seizure ([place])  . Stroke ([place])  . Thyroid disorder  Past Surgical History: Past Surgical History: Procedure Laterality Date . APPENDECTOMY    1980s . BRAIN SURGERY    1993 . CRANIOTOMY Right 04/09/2017  Procedure: CRANIOTOMY HEMATOMA EVACUATION SUBDURAL;  Surgeon: Erline Levine, MD;  Location: [place];  Service: Neurosurgery;  Laterality: Right; . DIRECT LARYNGOSCOPY N/A 04/09/2017  Procedure: DIRECT LARYNGOSCOPY;  Surgeon: Erline Levine, MD;  Location: [place];  Service: Neurosurgery;  Laterality: N/A; . DIRECT LARYNGOSCOPY N/A 04/09/2017  Procedure: DIRECT LARYNGOSCOPY;  Surgeon: [name], MD;  Location: [employer];  Service: ENT;  Laterality: N/A; . DIRECT LARYNGOSCOPY N/A 04/18/2017  Procedure: DIRECT LARYNGOSCOPY;  Surgeon: [name], MD;  Location: [place];  Service: ENT;  Laterality: N/A; . 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 Intubated 3/24-28; reintubated 3/28 due to stridor, notes indicate difficult intubation.  Extubated 4/2. ENT direct laryngoscopy 4/2 revealed: "inflammatory changes of the  posterior glottis as expected, small nodular granulation at the anterior tracheal wall at the site of the previous tracheostomy and some curvature to the right of the trachea.  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, he has significant decline in his functional status, with rehabilitation, he was eventually able to ambulate with walker, still with very unsteady ataxic gait, loss of hearing, slurred speech, difficulty reading, he had regained significant recovery from 2016, was able to begin to read his Bible again  Subjective: alert, Assessment / Plan / Recommendation CHL IP CLINICAL IMPRESSIONS 04/19/2017 Clinical Impression Pt presents with a primary oral dysphagia marked by impaired oral motor planning with decreased initiation of lip seal, decreased coordination of bolus control, leading to diffuse spillage of material within oral cavity.  There is delayed preparation of solid materials for swallow.  Once bolus material reaches pharynx, a swallow is triggered with reliable/consistent airway protection, adequate pharyngeal contraction, no residue post-swallow.  There were no incidents of penetration nor aspiration. Recommend starting a dysphagia 1 diet with thin liquids; meds whole in puree.  Encourage self-feeding as much as able with 1:1 assist as needed.  SLP will follow for safety/diet progression.  Please order speech/language evaluation.   SLP Visit Diagnosis Dysphagia, oral phase (R13.11) Attention and concentration deficit following -- Frontal lobe and executive function deficit following -- Impact on safety and function Mild aspiration risk   CHL IP TREATMENT RECOMMENDATION 04/19/2017 Treatment Recommendations Therapy as outlined in treatment plan below   No flowsheet data found. CHL IP DIET RECOMMENDATION 04/19/2017 SLP Diet Recommendations Dysphagia 1 (Puree) solids;Thin liquid Liquid Administration via Cup Medication Administration Whole meds with puree Compensations Small sips/bites;Minimize  environmental distractions Postural Changes --   CHL IP OTHER RECOMMENDATIONS 04/19/2017 Recommended Consults -- Oral Care Recommendations Oral care BID Other Recommendations --   CHL IP FOLLOW UP RECOMMENDATIONS 04/19/2017 Follow up Recommendations Other (comment)   CHL IP FREQUENCY AND DURATION 04/19/2017 Speech Therapy Frequency (ACUTE ONLY) min 2x/week Treatment Duration 2 weeks      CHL IP ORAL PHASE 04/19/2017 Oral Phase Impaired Oral - Pudding Teaspoon -- Oral - Pudding Cup -- Oral - Honey Teaspoon -- Oral - Honey Cup -- Oral - Nectar Teaspoon -- Oral - Nectar Cup -- Oral - Nectar Straw -- Oral - Thin Teaspoon Left anterior bolus loss;Right anterior bolus loss;Incomplete tongue to palate contact;Impaired mastication;Reduced posterior propulsion;Holding of bolus;Decreased bolus cohesion Oral - Thin Cup Left anterior bolus loss;Right anterior bolus loss;Incomplete tongue to palate contact;Impaired mastication;Reduced posterior propulsion;Holding of bolus;Decreased bolus cohesion Oral - Thin Straw -- Oral - Puree Left anterior bolus loss;Right anterior bolus loss;Incomplete tongue to palate contact;Impaired mastication;Reduced posterior propulsion;Holding of bolus;Delayed oral transit;Decreased bolus cohesion Oral - Mech Soft Left anterior bolus loss;Right anterior bolus loss;Incomplete tongue to palate contact;Impaired mastication;Reduced posterior propulsion;Holding of bolus;Decreased bolus cohesion;Delayed oral transit Oral - Regular -- Oral - Multi-Consistency -- Oral - Pill -- Oral Phase - Comment --  CHL IP PHARYNGEAL PHASE 04/19/2017 Pharyngeal Phase Impaired Pharyngeal- Pudding Teaspoon -- Pharyngeal -- Pharyngeal- Pudding Cup -- Pharyngeal -- Pharyngeal- Honey Teaspoon -- Pharyngeal -- Pharyngeal- Honey Cup -- Pharyngeal -- Pharyngeal- Nectar Teaspoon -- Pharyngeal -- Pharyngeal- Nectar Cup -- Pharyngeal -- Pharyngeal- Nectar Straw -- Pharyngeal -- Pharyngeal- Thin Teaspoon Delayed swallow initiation-pyriform  sinuses Pharyngeal -- Pharyngeal- Thin Cup Delayed swallow initiation-pyriform sinuses Pharyngeal -- Pharyngeal- Thin Straw -- Pharyngeal -- Pharyngeal- Puree -- Pharyngeal -- Pharyngeal- Mechanical Soft -- Pharyngeal -- Pharyngeal- Regular -- Pharyngeal -- Pharyngeal- Multi-consistency -- Pharyngeal -- Pharyngeal- Pill -- Pharyngeal -- Pharyngeal Comment --  No flowsheet data found. No flowsheet data found. Juan Quam Laurice 04/19/2017, 2:57 PM               Assessment/Plan:   LOS: 11 days  OK per DrStern to transfer to [facility]. CIR consult per Dr.Stern.   Verdis Prime 04/20/2017, 9:37 AM

## 2017-04-20 NOTE — Progress Notes (Signed)
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  Apparently fell 2 weeks PTA hitting his head.   CT scan showed large SDH with minimal midline shift R greater than L side. Extubated 4/2    PT Comments    Pt nonverbal with facial twitching throughout session. Pt continues to not follow commands for extremity movement but will automatically move arm if tangled in a wire and will assist with transfers, standing and gait. Pt with progression to OOB activity today and limited gait and will continue to benefit from acute and CIR therapies.     Follow Up Recommendations  CIR;Supervision/Assistance - 24 hour     Equipment Recommendations       Recommendations for Other Services       Precautions / Restrictions Precautions Precautions: Fall    Mobility  Bed Mobility Overal bed mobility: Needs Assistance Bed Mobility: Supine to Sit Rolling: Max assist;+2 for physical assistance   Supine to sit: +2 for physical assistance;Max assist     General bed mobility comments: assist to initiate task, assist to move LEs off bed.  After assist provided to lift trunk, he did assist minimally, pivot from supine to sit  Transfers Overall transfer level: Needs assistance Equipment used: 2 person hand held assist;Rolling walker (2 wheeled) Transfers: Sit to/from [facility] Sit to Stand: Mod assist;+2 physical assistance;Max assist Stand pivot transfers:  Max assist;+2 physical assistance       General transfer comment: Pt moved sit to stand from bed with mod A +2.  Assist to initiate activity, as well as assist to steady and to extend hips.  He required max A +2 to weight shift and step to chair.  Moved sit to stand from recliner, and required max A +2 to boost up from low surface   Ambulation/Gait Ambulation/Gait assistance: Max assist;+2 physical assistance Ambulation Distance (Feet): 8 Feet Assistive device: Rolling walker (2 wheeled) Gait Pattern/deviations: Step-to pattern;Trunk flexed   Gait velocity interpretation: Below normal speed for age/gender General Gait Details: max facilitation at pelvis for weight shift and advancement of limbs, with cues and tactile assist able to advance bil LE with use of RW. Pt required max assist to control and direct RW with mobility, chair followed closely behind with pt sitting unexpectedly with fatigue. Would greatly benefit from +3 for gait to manage pt and lines   Stairs            Wheelchair Mobility    Modified Rankin (Stroke Patients Only) Modified Rankin (Stroke Patients Only) Pre-Morbid Rankin Score: Moderate disability Modified Rankin: Severe disability     Balance Overall balance assessment: Needs assistance Sitting-balance support: Single extremity supported;Bilateral upper extremity supported;No upper extremity supported;Feet supported Sitting balance-Leahy Scale: Poor Sitting balance - Comments: Pt with Rt lateral lean/head and neck rotated to the Rt.  He initially required mod A to sit EOB, progressed to min guard for ~4 mins.  At end of session, required max A to sit EOB, likely due to fatigue  Postural control: Right lateral lean;Posterior lean Standing balance support: Bilateral upper extremity supported;Single extremity supported Standing balance-Leahy Scale: Poor Standing  balance comment: Pt initially required mod A +2, progressed to light min A +2. He spontaneously  reached up with bil. UEs to hold onto therapists to support himself.   Second attempt as standing with RW - required min A +2 with bil. UE support of RW                             Cognition Arousal/Alertness: Awake/alert Behavior During Therapy: Flat affect Overall Cognitive Status: Impaired/Different from baseline Area of Impairment: Attention;Following commands                   Current Attention Level: Focused   Following Commands: Follows one step commands with increased time;Follows one step commands inconsistently Safety/Judgement: Decreased awareness of deficits;Decreased awareness of safety     General Comments: Pt with eyes open.  twitching of eyes, face and mouth noted.  Lt gaze preference.  He will intermittently attempt to look to therapist when max multi modal cues provided.  He followed no other commands except "look at me".  He will however, spontaneously move extremities with cueing to assist with activity (although very inconsistent).  Cognition difficult to assess as he is non verbal, and unsure if he has communication deficit, and appears as if he may be apraxic       Exercises      General Comments General comments (skin integrity, edema, etc.): VSS with sats 95% on RA      Pertinent Vitals/Pain Pain Assessment: Faces Faces Pain Scale: No hurt    Home Living                      Prior Function            PT Goals (current goals can now be found in the care plan section) Progress towards PT goals: Progressing toward goals    Frequency           PT Plan Current plan remains appropriate    Co-evaluation              AM-PAC PT "6 Clicks" Daily Activity  Outcome Measure  Difficulty turning over in bed (including adjusting bedclothes, sheets and blankets)?: Unable Difficulty moving from lying on back to sitting on the side of the bed? : Unable Difficulty sitting down on and standing up from a chair with arms  (e.g., wheelchair, bedside commode, etc,.)?: Unable Help needed moving to and from a bed to chair (including a wheelchair)?: A Lot Help needed walking in hospital room?: A Lot Help needed climbing 3-5 steps with a railing? : Total 6 Click Score: 8    End of Session Equipment Utilized During Treatment: Gait belt Activity Tolerance: Patient tolerated treatment well Patient left: in chair;with call bell/phone within reach;with chair alarm set Nurse Communication: Mobility status;Precautions PT Visit Diagnosis: Other abnormalities of gait and mobility (R26.89);Muscle weakness (generalized) (M62.81);Other symptoms and signs involving the nervous system (R29.898)     Time: 7035-0093 PT Time Calculation (min) (ACUTE ONLY): 24 min  Charges:  $Gait Training: 8-22 mins                    G Codes:       Elwyn Reach, PT [phone]    [place] 04/20/2017, 1:48 PM

## 2017-04-20 NOTE — Progress Notes (Signed)
EEG complete - results pending 

## 2017-04-20 NOTE — Evaluation (Cosign Needed)
Speech Language Pathology Evaluation Patient Details Name: Glen [name] MRN: [id number] DOB: 22-Feb-1970 Today's Date: 04/20/2017 Time: 3244-0102 SLP Time Calculation (min) (ACUTE ONLY): 13 min  Problem List:  Patient Active Problem List   Diagnosis Date Noted  . Abnormal movements   . Subdural hematoma ([place]) 04/09/2017  . Seizures ([place]) 02/13/2017   Past Medical History:  Past Medical History:  Diagnosis Date  . Brain cancer ([place])   . Hypercholesteremia   . Seizure ([place])   . Stroke ([place])   . Thyroid disorder    Past Surgical History:  Past Surgical History:  Procedure Laterality Date  . APPENDECTOMY     1980s  . BRAIN SURGERY     1993  . CRANIOTOMY Right 04/09/2017   Procedure: CRANIOTOMY HEMATOMA EVACUATION SUBDURAL;  Surgeon: Erline Levine, MD;  Location: [employer];  Service: Neurosurgery;  Laterality: Right;  . DIRECT LARYNGOSCOPY N/A 04/09/2017   Procedure: DIRECT LARYNGOSCOPY;  Surgeon: Erline Levine, MD;  Location: [place];  Service: Neurosurgery;  Laterality: N/A;  . DIRECT LARYNGOSCOPY N/A 04/09/2017   Procedure: DIRECT LARYNGOSCOPY;  Surgeon: [name], MD;  Location: [place];  Service: ENT;  Laterality: N/A;  . DIRECT LARYNGOSCOPY N/A 04/18/2017   Procedure: DIRECT LARYNGOSCOPY;  Surgeon: [name], MD;  Location: [place];  Service: ENT;  Laterality: N/A;  . 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 Intubated 3/24-28; reintubated 3/28 due to stridor,  notes indicate difficult intubation.  Extubated 4/2. ENT direct laryngoscopy 4/2 revealed: "inflammatory changes of the posterior glottis as expected, small nodular granulation at the anterior tracheal wall at the site of the previous tracheostomy and some curvature to the right of the trachea.  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 Pt benefited from SLP standing on right side to best facilitate communication/attention, as indicated by improved focused attention to auditory stimulation, increased eye contact frequency, and one vocalization of "hey" only when SLP was on pt's right. SLP provided max visual, tactile, verbal cues, with pt responding inconsistently to select one step commands. Overall assessment was limited by reduced knowledge of pt's baseline cognitive communication ability and pt's fluctuating mental status. Of note, pt had reduced response to yes/no questions and attention to simple functional task of eating compared to previous session with SLP on 4/3. SLP will follow up with further intervention regarding above deficits.      SLP Assessment  SLP Recommendation/Assessment: Patient  needs continued Speech Lanaguage Pathology Services SLP Visit Diagnosis: Cognitive communication  deficit (R41.841);Attention and concentration deficit Attention and concentration deficit following: Nontraumatic intracerebral hemorrhage    Follow Up Recommendations  Inpatient Rehab    Frequency and Duration min 2x/week  2 weeks      SLP Evaluation Cognition  Overall Cognitive Status: Within Functional Limits for tasks assessed Arousal/Alertness: Awake/alert Orientation Level: Other (comment)(limited response to orientation questions) Attention: Focused Focused Attention: Impaired Focused Attention Impairment: Functional basic;Verbal basic       Comprehension  Auditory Comprehension Overall Auditory Comprehension: Impaired Yes/No Questions: Impaired Basic Biographical Questions: 0-25% accurate Commands: Impaired One Step Basic Commands: 0-24% accurate Interfering Components: Attention;Hearing EffectiveTechniques: Increased volume;Visual/Gestural cues(standing on pt's right) Visual Recognition/Discrimination Discrimination: Not tested Reading Comprehension Reading Status: Not tested    Expression Expression Primary Mode of Expression: Verbal Verbal Expression Overall Verbal Expression: Impaired Initiation: Impaired Automatic Speech: Social Response(one vocalization, "Hey") Level of Generative/Spontaneous Verbalization: Word Naming: Not tested Pragmatics: Unable to assess Interfering Components: Attention;Premorbid deficit   Oral / Motor  Oral Motor/Sensory Function Overall Oral Motor/Sensory Function: Other (comment)(unable to assess this session) Motor Speech Overall Motor Speech: Other (comment)(unable to assess secondary to limited verbalizations)   GO                   Martin[place] Gerlene Glassburn SLP Student Clinician  [place] Aarik Blank 04/20/2017, 1:38 PM

## 2017-04-20 NOTE — Progress Notes (Signed)
Subjective: Began having focal seizures this morning.   Exam: Vitals:   04/20/17 0900 04/20/17 1000  BP: 103/64 105/75  Pulse: 80 89  Resp: (!) 24 (!) 21  Temp:    SpO2: 96% 95%   Gen: In bed, NAD Resp: non-labored breathing, no acute distress Abd: soft, nt   Neuro: MS: Awake, alert, engages with the examiner but does not follow commands. KX:FGHW gaze prefference with nystagmus, doe snot cross midline. Clonic LEFT facial movements.  Motor: He moves all extremities with good strength, no clonic activity in the limbs.   Impression: 47 year old male with subdural hematoma an  with recurrent partial seizures.   Recommendations: 1) Continue Keppra 2) Additional 100mg  IV vimpat, increase to 200mg  BID 3) If no response will load with depakote.  4) will follow  Roland Rack, MD [facility] [phone]  If 7pm- 7am, please page neurology on call as listed in [place].

## 2017-04-20 NOTE — Progress Notes (Signed)
I will follow pt's progress with therapy to assist with planning dispo when appropriate. We need to see consistent ability to participated with therapies. [phone]

## 2017-04-20 NOTE — Consult Note (Signed)
Physical Medicine and Rehabilitation Consult Reason for Consult: Decreased functional mobility Referring Physician: Dr. [name]   HPI: Glen [name] is a 47 y.o. right-handed male with history of glioblastoma status post surgical removal tumor 1993 by Dr. 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 Krista Blue.  Presented 04/09/2017 upon transfer from [facility] with altered mental status times 2 days as well as reports of recent falls.  Per chart review patient uses a walker was able to speak and interact with family.  There was initial concern for some possible sepsis with noted low-grade fever and elevated WBC and treated with Rocephin while at [facility].  Cranial CT scan from outside hospital showed large subdural hematoma on the right.  Required intubation with difficult airway noted inspiratory stridor was intubated by ENT prior to going to the OR.  Underwent craniotomy hematoma evacuation 04/10/2017 per Dr. [name].  Hospital course noted seizures EEG with generalized cerebral dysfunction follow-up per neurology services and Keppra was adjusted as well as the addition of Vimpat.  Patient slowly weaned from ventilator.  Later underwent transnasal fiberoptic laryngoscopy 04/18/2017 per Dr. [name] after initial stridor and follow-up after extubation.  MRSA PCR screening positive for contact precautions.  Currently maintained on a dysphagia 1 thin liquid diet.  Therapy evaluations have been completed and recommendations of physical medicine rehab consult.   Review of Systems  Unable to perform ROS: Acuity of condition   Past Medical History:  Diagnosis Date  . Brain cancer ([place])   . Hypercholesteremia   . Seizure ([place])   . Stroke ([place])   . Thyroid disorder    Past Surgical History:  Procedure Laterality Date  . APPENDECTOMY     1980s  . BRAIN SURGERY     1993  . CRANIOTOMY  Right 04/09/2017   Procedure: CRANIOTOMY HEMATOMA EVACUATION SUBDURAL;  Surgeon: Erline Levine, MD;  Location: [place];  Service: Neurosurgery;  Laterality: Right;  . DIRECT LARYNGOSCOPY N/A 04/09/2017   Procedure: DIRECT LARYNGOSCOPY;  Surgeon: Erline Levine, MD;  Location: [place];  Service: Neurosurgery;  Laterality: N/A;  . DIRECT LARYNGOSCOPY N/A 04/09/2017   Procedure: DIRECT LARYNGOSCOPY;  Surgeon: [name], MD;  Location: [place];  Service: ENT;  Laterality: N/A;  . DIRECT LARYNGOSCOPY N/A 04/18/2017   Procedure: DIRECT LARYNGOSCOPY;  Surgeon: [name], MD;  Location: [place];  Service: ENT;  Laterality: N/A;  . TRACHEOSTOMY TUBE PLACEMENT N/A 04/18/2017   Procedure: TRANSNASAL FIBER OPTIC LARYNGOSCOPY;  Surgeon: [name], MD;  Location: [place];  Service: ENT;  Laterality: N/A;   Family History  Adopted: Yes   Social History:  reports that he has never smoked. He has never used smokeless tobacco. He reports that he does not drink alcohol or use drugs. Allergies:  Allergies  Allergen Reactions  . Heparin Anaphylaxis    Patient has HITT  . Lidocaine Hives  . Phenytoin Sodium Extended Rash   Medications Prior to Admission  Medication Sig Dispense Refill  . atorvastatin (LIPITOR) 20 MG tablet Take 20 mg by mouth daily.  2  . divalproex (DEPAKOTE) 250 MG DR tablet Take 250-500 mg by mouth 2 (two) times daily. 250mg  in the morning and 500mg  at bedtime  0  . FLUoxetine (PROZAC) 20 MG capsule Take by mouth daily.  2  . fondaparinux (ARIXTRA) 2.5 MG/0.5ML SOLN injection INJECT 2.5 MG SUBCUTANEOUSLY DAILY  2  . levETIRAcetam (KEPPRA) 750  MG tablet Take 1 tablet (750 mg total) by mouth every 12 (twelve) hours. 60 tablet 0  . levothyroxine (SYNTHROID, LEVOTHROID) 75 MCG tablet Take 75 mcg by mouth daily.  2  . loratadine (CLARITIN) 10 MG tablet Take 10 mg by mouth daily.  1    Home: Home Living Family/patient expects to be discharged to:: Private residence Living Arrangements:  Parent Additional Comments: Pt unable to provide PLOF and home set up  Functional History: Prior Function Level of Independence: Needs assistance Gait / Transfers Assistance Needed: per MOM pt walks with RW Comments: No family present. Pt unable to provide further information on home set up and PLOF Functional Status:  Mobility: Bed Mobility Overal bed mobility: Needs Assistance Bed Mobility: Supine to Sit, Sit to Supine Rolling: Max assist Sidelying to sit: Max assist, +2 for physical assistance Supine to sit: Mod assist, +2 for physical assistance Sit to supine: Min assist General bed mobility comments: assist to pivot to EOB with assist and guidance for lower body and trunk. mIn assist with guarding lines and tubes for return to bed Transfers Overall transfer level: Needs assistance Equipment used: 2 person hand held assist Transfers: Sit to/from Stand Sit to Stand: Max assist, +2 physical assistance, Mod assist Stand pivot transfers: Max assist, +2 physical assistance General transfer comment: on initial trial pt max +2 to stand with assist for anterior translation and bil knees blocked, repeated 2 trials with mod assist +2 with pt able to maintain standing grossly 1 min on second trial with cues for anterior translation. With mod assist to advance bil LE pt able to side step toward [facility] Ambulation/Gait General Gait Details: not tested    ADL: ADL Overall ADL's : Needs assistance/impaired Eating/Feeding: NPO Grooming: Maximal assistance, Cueing for sequencing, Sitting Upper Body Bathing: Maximal assistance, Sitting Lower Body Bathing: Maximal assistance, Bed level Upper Body Dressing : Maximal assistance, Sitting Lower Body Dressing: Maximal assistance, Bed level Lower Body Dressing Details (indicate cue type and reason): Donned socks Functional mobility during ADLs: Moderate assistance, Maximal assistance, +2 for physical assistance(sit<>stand only) General ADL Comments:  Due to cognition and impulsivity, pt requiring Max A for ADLs for safety. Pt performing sit<>Stand transfer at EOB with Mod-Max A +2. Pt highly impulsive and restless. Difficulty following simple commands.   Cognition: Cognition Overall Cognitive Status: Difficult to assess Orientation Level: (P) Disoriented X4, Other (comment)(incomprehensible speech) Cognition Arousal/Alertness: Awake/alert Behavior During Therapy: Restless Overall Cognitive Status: Difficult to assess Area of Impairment: Following commands, Safety/judgement Following Commands: Follows one step commands inconsistently Safety/Judgement: Decreased awareness of deficits, Decreased awareness of safety General Comments: Following simple commands inconsistantly Difficult to assess due to: Intubated  Blood pressure 103/64, pulse 80, temperature 98.7 F (37.1 C), resp. rate (!) 24, height 5\' 10"  (1.778 m), weight 83.6 kg (184 lb 4.9 oz), SpO2 96 %. Physical Exam  Vitals reviewed. HENT:  Old craniotomy site well-healed  Eyes:  Pupils sluggish but reactive to light  Neck: Normal range of motion. Neck supple. No thyromegaly present.  Cardiovascular: Normal rate, regular rhythm and normal heart sounds.  Respiratory: Effort normal and breath sounds normal. No respiratory distress.  GI: Soft. Bowel sounds are normal. He exhibits no distension.  Neurological:  Patient is alert but nonverbal.  He would not follow commands.  He does appear to track people in the room but inconsistently.  Noted facial twitching.  Skin: Skin is warm and dry.    Results for orders placed or performed during the hospital  encounter of 04/09/17 (from the past 24 hour(s))  Glucose, capillary     Status: Abnormal   Collection Time: 04/19/17 11:47 AM  Result Value Ref Range   Glucose-Capillary 109 (H) 65 - 99 mg/dL  Glucose, capillary     Status: Abnormal   Collection Time: 04/19/17  3:41 PM  Result Value Ref Range   Glucose-Capillary 101 (H) 65 -  99 mg/dL  Glucose, capillary     Status: None   Collection Time: 04/19/17  7:41 PM  Result Value Ref Range   Glucose-Capillary 94 65 - 99 mg/dL  Glucose, capillary     Status: None   Collection Time: 04/19/17 11:34 PM  Result Value Ref Range   Glucose-Capillary 95 65 - 99 mg/dL  Glucose, capillary     Status: None   Collection Time: 04/20/17  3:38 AM  Result Value Ref Range   Glucose-Capillary 95 65 - 99 mg/dL  Glucose, capillary     Status: None   Collection Time: 04/20/17  8:25 AM  Result Value Ref Range   Glucose-Capillary 89 65 - 99 mg/dL   Dg Chest Port 1 View  Result Date: 04/19/2017 CLINICAL DATA:  Status post CVA, seizure; history of brain malignancy. EXAM: PORTABLE CHEST 1 VIEW COMPARISON:  Chest x-ray of April 18, 2017 FINDINGS: There has been interval extubation of the trachea and esophagus. The lungs are reasonably well expanded and clear. The heart and pulmonary vascularity are normal. There is no pleural effusion. A ventriculoperitoneal shunt tube is present to the left of midline. IMPRESSION: No active cardiopulmonary disease. Interval extubation of the trachea and esophagus. Electronically Signed   By: David  Martinique M.D.   On: 04/19/2017 09:36   Dg Swallowing Func-speech Pathology  Result Date: 04/19/2017 Objective Swallowing Evaluation: Type of Study: MBS-Modified Barium Swallow Study  Patient Details Name: DERRYL UHER MRN: [id number] Date of Birth: [birth year] Today's Date: 04/19/2017 Time: SLP Start Time (ACUTE ONLY): 5093 -SLP Stop Time (ACUTE ONLY): 1425 SLP Time Calculation (min) (ACUTE ONLY): 30 min Past Medical History: Past Medical History: Diagnosis Date . Brain cancer ([place])  . Hypercholesteremia  . Seizure ([place])  . Stroke ([place])  . Thyroid disorder  Past Surgical History: Past Surgical History: Procedure Laterality Date . APPENDECTOMY    1980s . BRAIN SURGERY    1993 . CRANIOTOMY Right 04/09/2017  Procedure: CRANIOTOMY HEMATOMA EVACUATION SUBDURAL;  Surgeon: Erline Levine, MD;  Location: [place];  Service: Neurosurgery;  Laterality: Right; . DIRECT LARYNGOSCOPY N/A 04/09/2017  Procedure: DIRECT LARYNGOSCOPY;  Surgeon: Erline Levine, MD;  Location: [place];  Service: Neurosurgery;  Laterality: N/A; . DIRECT LARYNGOSCOPY N/A 04/09/2017  Procedure: DIRECT LARYNGOSCOPY;  Surgeon: [name], MD;  Location: [place];  Service: ENT;  Laterality: N/A; . DIRECT LARYNGOSCOPY N/A 04/18/2017  Procedure: DIRECT LARYNGOSCOPY;  Surgeon: [name], MD;  Location: [place];  Service: ENT;  Laterality: N/A; . 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 Intubated 3/24-28; reintubated 3/28 due to stridor, notes indicate difficult intubation.  Extubated 4/2. ENT direct laryngoscopy 4/2 revealed: "inflammatory changes of the posterior glottis as expected, small  nodular granulation at the anterior tracheal wall at the site of the previous tracheostomy and some curvature to the right of the trachea.  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, he has significant decline in his functional status, with rehabilitation, he was eventually able to ambulate with walker, still with very unsteady ataxic gait, loss of hearing, slurred speech, difficulty reading, he had regained significant recovery from 2016,  was able to begin to read his Bible again  Subjective: alert, Assessment / Plan / Recommendation CHL IP CLINICAL IMPRESSIONS 04/19/2017 Clinical Impression Pt presents with a primary oral dysphagia marked by impaired oral motor planning with decreased initiation of lip seal, decreased coordination of bolus control, leading to diffuse spillage of material within oral cavity.  There is delayed preparation of solid materials for swallow.  Once bolus material reaches pharynx, a swallow is triggered with reliable/consistent airway protection, adequate pharyngeal contraction, no residue post-swallow.  There were no incidents of penetration nor aspiration. Recommend starting a dysphagia 1 diet with thin liquids; meds whole in puree.  Encourage self-feeding as much as able with 1:1 assist as needed.  SLP will follow for safety/diet progression.  Please order speech/language evaluation.   SLP Visit Diagnosis Dysphagia, oral phase (R13.11) Attention and concentration deficit following -- Frontal lobe and executive function deficit following -- Impact on safety and function Mild aspiration risk   CHL IP TREATMENT RECOMMENDATION 04/19/2017 Treatment Recommendations Therapy as outlined in treatment plan below   No flowsheet data found. CHL IP DIET RECOMMENDATION 04/19/2017 SLP Diet Recommendations Dysphagia 1 (Puree) solids;Thin liquid Liquid Administration via Cup Medication Administration Whole meds with puree Compensations Small sips/bites;Minimize environmental distractions Postural Changes --   CHL IP OTHER RECOMMENDATIONS 04/19/2017 Recommended Consults -- Oral Care Recommendations Oral care BID Other Recommendations --   CHL IP FOLLOW UP RECOMMENDATIONS 04/19/2017 Follow up Recommendations Other (comment)   CHL IP FREQUENCY AND DURATION 04/19/2017 Speech Therapy Frequency (ACUTE ONLY) min 2x/week Treatment Duration 2 weeks      CHL IP ORAL PHASE 04/19/2017 Oral Phase Impaired Oral - Pudding Teaspoon -- Oral - Pudding Cup -- Oral -  Honey Teaspoon -- Oral - Honey Cup -- Oral - Nectar Teaspoon -- Oral - Nectar Cup -- Oral - Nectar Straw -- Oral - Thin Teaspoon Left anterior bolus loss;Right anterior bolus loss;Incomplete tongue to palate contact;Impaired mastication;Reduced posterior propulsion;Holding of bolus;Decreased bolus cohesion Oral - Thin Cup Left anterior bolus loss;Right anterior bolus loss;Incomplete tongue to palate contact;Impaired mastication;Reduced posterior propulsion;Holding of bolus;Decreased bolus cohesion Oral - Thin Straw -- Oral - Puree Left anterior bolus loss;Right anterior bolus loss;Incomplete tongue to palate contact;Impaired mastication;Reduced posterior propulsion;Holding of bolus;Delayed oral transit;Decreased bolus cohesion Oral - Mech Soft Left anterior bolus loss;Right anterior bolus loss;Incomplete tongue to palate contact;Impaired mastication;Reduced posterior propulsion;Holding of bolus;Decreased bolus cohesion;Delayed oral transit Oral - Regular -- Oral - Multi-Consistency -- Oral - Pill -- Oral Phase - Comment --  CHL IP PHARYNGEAL PHASE 04/19/2017 Pharyngeal Phase Impaired Pharyngeal- Pudding Teaspoon -- Pharyngeal -- Pharyngeal- Pudding Cup -- Pharyngeal -- Pharyngeal- Honey Teaspoon -- Pharyngeal -- Pharyngeal- Honey Cup -- Pharyngeal -- Pharyngeal- Nectar Teaspoon -- Pharyngeal -- Pharyngeal- Nectar Cup -- Pharyngeal -- Pharyngeal- Nectar Straw -- Pharyngeal -- Pharyngeal- Thin Teaspoon Delayed swallow initiation-pyriform sinuses Pharyngeal -- Pharyngeal- Thin  Cup Delayed swallow initiation-pyriform sinuses Pharyngeal -- Pharyngeal- Thin Straw -- Pharyngeal -- Pharyngeal- Puree -- Pharyngeal -- Pharyngeal- Mechanical Soft -- Pharyngeal -- Pharyngeal- Regular -- Pharyngeal -- Pharyngeal- Multi-consistency -- Pharyngeal -- Pharyngeal- Pill -- Pharyngeal -- Pharyngeal Comment --  No flowsheet data found. No flowsheet data found. Juan Quam Laurice 04/19/2017, 2:57 PM                Assessment/Plan: Diagnosis: Right SDH after fall, hx of glioblastoma, seizure disorder 1. Does the need for close, 24 hr/day medical supervision in concert with the patient's rehab needs make it unreasonable for this patient to be served in a less intensive setting? Potentially 2. Co-Morbidities requiring supervision/potential complications: multiple issues noted above related to his glioblastoma 3. Due to bladder management, bowel management, safety, skin/wound care, disease management, medication administration, pain management and patient education, does the patient require 24 hr/day rehab nursing? Yes 4. Does the patient require coordinated care of a physician, rehab nurse, PT (1-2 hrs/day, 5 days/week), OT (1-2 hrs/day, 5 days/week) and SLP (1-2 hrs/day, 5 days/week) to address physical and functional deficits in the context of the above medical diagnosis(es)? Yes/potentially Addressing deficits in the following areas: balance, endurance, locomotion, strength, transferring, bowel/bladder control, bathing, dressing, feeding, grooming, toileting, cognition, speech and language 5. Can the patient actively participate in an intensive therapy program of at least 3 hrs of therapy per day at least 5 days per week? Potentially 6. The potential for patient to make measurable gains while on inpatient rehab is good and fair 7. Anticipated functional outcomes upon discharge from inpatient rehab are min assist and mod assist  with PT, min assist and mod assist with OT, min assist and mod assist with SLP. 8. Estimated rehab length of stay to reach the above functional goals is: 20+ days 9. Anticipated D/C setting: Home 10. Anticipated post D/C treatments: [place] therapy 11. Overall Rehab/Functional Prognosis: fair  RECOMMENDATIONS: This patient's condition is appropriate for continued rehabilitative care in the following setting: To be determined Patient has agreed to participate in recommended program.  N/A Note that insurance prior authorization may be required for reimbursement for recommended care.  Comment: Will need to see consistent ability to participate in therapy before considering inpatient rehab.   Meredith Staggers, MD, [facility] 04/20/2017    Lavon Paganini Jolley, PA-C 04/20/2017

## 2017-04-20 NOTE — Procedures (Signed)
History: 47 year old male admitted with with subdural hematoma.  Had an episode of focal status epilepticus earlier today, evaluate for ongoing seizure  Sedation: Ativan prior to study  Technique: This is a 21 channel routine scalp EEG performed at the bedside with bipolar and monopolar montages arranged in accordance to the international 10/20 system of electrode placement. One channel was dedicated to EKG recording.    Background: At the onset of his recording, he is asleep with intermittent sleep spindles, these are attenuated on the right side.  There is evidence of breach rhythm on the right.  Following stimulation, there does appear to be a posterior dominant rhythm of 10 Hz, though most of the EEG is obscured by muscle artifact during the waking.,  He immediately falls back asleep.  Photic stimulation: Physiologic driving is not performed  EEG Abnormalities: 1) breach artifact in the right 2) attenuation of faster frequencies over the right hemisphere  Clinical Interpretation: This EEG is suggestive of a right hemispheric dysfunction as can be seen with overlying mass lesion such as subdural hematoma. There was no seizure or definite seizure predisposition recorded on this study. Please note that a normal EEG does not preclude the possibility of epilepsy.   Roland Rack, MD [facility] [phone]  If 7pm- 7am, please page neurology on call as listed in [place].

## 2017-04-20 NOTE — Progress Notes (Signed)
[facility] Progress Note Patient Name: Glen [name] DOB: 1970/03/23 MRN: [id number]   Date of Service  04/20/2017  HPI/Events of Note  [facility] hospitalist concerned regarding transient drop in BP which responded to fluid bolus.  eICU Interventions  VSS currently stable. MAP 74. No indication for PCCM re-consultation.        Kerry Kass Ogan 04/20/2017, 11:15 PM

## 2017-04-20 NOTE — Progress Notes (Signed)
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  Apparently fell 2 weeks ago hitting his head.   CT scan showed large SDH with minimal midline shift R greater than L side   OT comments  Pt seen in conjunction with PT.  He will follow occasional command to look to therapist, but does not follow other commands.  He however, was able to move sit to stand with mod A+2 - max A +2, and will spontaneously attempt to assist.  He ambulated ~8' with max A +2  - needs assist to weight shift, and advance LEs.   Pt appears apraxic and with difficulty initiating movement.   Recommend CIR.   Follow Up Recommendations  CIR;Supervision/Assistance - 24 hour    Equipment Recommendations  None recommended by OT    Recommendations for Other Services      Precautions / Restrictions         Mobility Bed Mobility Overal bed mobility: Needs Assistance Bed Mobility: Supine to Sit Rolling: Max assist;+2 for physical assistance         General bed mobility comments: assist to initiate task, assist to move LEs off bed.  After assist provided to lift trunk, he did assist minimally   Transfers Overall transfer level: Needs assistance Equipment used: 2 person hand held assist;Rolling walker (2 wheeled) Transfers: Sit to/from [facility] Sit to Stand: Mod assist;+2 physical assistance;Max assist Stand pivot transfers: Max assist;+2  physical assistance       General transfer comment: Pt moved sit to stand from bed with mod A +2.  Assist to initiate activity, as well as assist to steady and to extend hips.  He required max A +2 to weight shift and step to chair.  Moved sit to stand from recliner, and required max A +2 to boost up from low surface     Balance Overall balance assessment: Needs assistance Sitting-balance support: Single extremity supported;Bilateral upper extremity supported;No upper extremity supported;Feet supported Sitting balance-Leahy Scale: Poor Sitting balance - Comments: Pt with Rt lateral lean/head and neck rotated to the Rt.  He initially required mod A to sit EOB, progressed to min guard for ~4 mins.  At end of session, required max A to sit EOB, likely due to fatigue  Postural control: Right lateral lean;Posterior lean Standing balance support: Bilateral upper extremity supported;Single extremity supported Standing balance-Leahy Scale: Poor Standing balance comment: Pt initially required mod A +2, progressed to light min A +2. He spontaneously reached up with bil. UEs to hold onto therapists to support himself.   Second attempt as standing with RW - required min A +2 with bil. UE support of RW                            ADL either performed or assessed with clinical judgement   ADL Overall ADL's : Needs assistance/impaired Eating/Feeding: NPO  Functional mobility during ADLs: Maximal assistance;+2 for physical assistance;Rolling walker General ADL Comments: Did not engage in ADL tasks despite max cueing      Vision   Additional Comments: Pt with eyes deviated to Lt, and head/neck rotated to Rt.  Oscillopsia noted bil.  He will, on occasion look to therapist on his Lt    Perception     Praxis      Cognition Arousal/Alertness: Awake/alert Behavior During Therapy: Flat affect Overall Cognitive Status: Within Functional Limits  for tasks assessed Area of Impairment: Attention;Following commands                   Current Attention Level: Focused   Following Commands: Follows one step commands with increased time;Follows one step commands inconsistently       General Comments: Pt with eyes open.  twitching of eyes, face and mouth noted.  Lt gaze preference.  He will intermittently attempt to look to therapist when max multi modal cues provided.  He followed no other commands except "look at me".  He will however, spontaneously move extremities with cueing to assist with activity (although very inconsistent).  Cognition difficult to assess as he is non verbal, and unsure if he has communication deficit, and appears as if he may be apraxic         Exercises     Shoulder Instructions       General Comments VSS with sats 95% on RA    Pertinent Vitals/ Pain       Pain Assessment: Faces Faces Pain Scale: No hurt  Home Living                                          Prior Functioning/Environment              Frequency  Min 2X/week        Progress Toward Goals  OT Goals(current goals can now be found in the care plan section)  Progress towards OT goals: Progressing toward goals     Plan Discharge plan remains appropriate    Co-evaluation                 AM-PAC PT "6 Clicks" Daily Activity     Outcome Measure   Help from another person eating meals?: Total Help from another person taking care of personal grooming?: Total Help from another person toileting, which includes using toliet, bedpan, or urinal?: Total Help from another person bathing (including washing, rinsing, drying)?: Total Help from another person to put on and taking off regular upper body clothing?: Total Help from another person to put on and taking off regular lower body clothing?: Total 6 Click Score: 6    End of Session Equipment Utilized During Treatment: Gait belt  OT Visit  Diagnosis: Unsteadiness on feet (R26.81);Other abnormalities of gait and mobility (R26.89);Muscle weakness (generalized) (M62.81);Other symptoms and signs involving cognitive function   Activity Tolerance Patient tolerated treatment well   Patient Left in chair;with call bell/phone within reach;with chair alarm set   Nurse Communication Mobility status        Time: 6045-4098 OT Time Calculation (min): 34 min  Charges: OT General Charges $OT Visit: 1 Visit OT Treatments $Neuromuscular Re-education: 8-22 mins  [facility], OTR/L [phone]    Lucille Passy M 04/20/2017, 12:46 PM

## 2017-04-20 NOTE — Progress Notes (Addendum)
Pt having pronounced rhythmic chewing motions of L jaw and L beating nystagmus, Dr. Leonel Ramsay paged, 2 mg Ativan given, second dose Vimpat given.  1230:  Pt still having symptoms (although subdued), despite aforementioned interventions.  Dr. Leonel Ramsay paged again.

## 2017-04-20 NOTE — Progress Notes (Signed)
Patient ID: Glen [name], male   DOB: Nov 28, 1970, 47 y.o.   MRN: [id number]  PROGRESS NOTE    Glen [name]  KGY:[id number] DOB: June 29, 1970 DOA: 04/09/2017 PCP: [name], PA-C   Brief Narrative:  47 year old male with history of glioblastoma status post removal in 1993, seizure secondary to fourth ventricle medulloblastoma status post left frontal VP shunt, prior CVA, heparin-induced thrombocytopenia on Arixtra, autoimmune encephalomyelitis, testosterone deficiency, hypothyroidism, hyperlipidemia, previous respiratory failure requiring tracheostomy who presented to [facility] emergency department on 04/09/2017 with confusion for 2 days and fall 2 weeks prior.  He was found to have large subdural hematoma and was transferred to [facility] under neurosurgery service.  He underwent craniotomy with hematoma evacuation.  ENT was consulted for difficult airway.  He initially failed extubation on 04/13/2017.  He had seizure-like activities overnight of 04/16/2017 and EEG performed had demonstrated diffuse nonspecific cerebral dysfunction.  Neurology was consulted.  MRI on 04/18/2017 demonstrated bilateral SDH which were stable.  Patient was extubated in OR on 04/18/2017 by ENT. PCCM has signed off.  TRH was requested to follow-up on the patient from 04/20/2017.  Patient is currently under neurosurgery service.   Assessment & Plan:   Active Problems:   Subdural hematoma (HCC)   Abnormal movements  Subdural hematoma status post evacuation -Management as per primary neurosurgery team  Acute hypoxic respiratory failure with stridor -Patient has already been extubated on 04/18/2017. PCCM has signed off. -Currently on room air -Incentive spirometry  Recurrent partial seizures in a patient with history of CNS tumor followed by a medulloblastoma with VP shunt -Neurology following.  Continue antiepileptics as per neurology -Seizure precautions.  Aspiration precautions.  Fall  precautions  Leukocytosis -Probably reactive from Decadron use.  Off Decadron -No labs today.  Repeat a.m. Labs  Hypothyroidism -Continue Synthroid  Generalized deconditioning -PT/OT/SLP eval  Subjective: Patient seen and examined at bedside.  He is awake but hardly answers any questions.  No obvious seizure-like activity seen.  No overnight fever reported.  Objective: Vitals:   04/20/17 0900 04/20/17 1000 04/20/17 1100 04/20/17 1200  BP: 103/64 105/75 109/80 107/72  Pulse: 80 89 87 87  Resp: (!) 24 (!) 21 20 19   Temp:      TempSrc:      SpO2: 96% 95% 95% 95%  Weight:      Height:        Intake/Output Summary (Last 24 hours) at 04/20/2017 1339 Last data filed at 04/20/2017 1000 Gross per 24 hour  Intake 1650 ml  Output 1350 ml  Net 300 ml   Filed Weights   04/18/17 0416 04/19/17 0500 04/20/17 0400  Weight: 85 kg (187 lb 6.3 oz) 85.8 kg (189 lb 2.5 oz) 83.6 kg (184 lb 4.9 oz)    Examination:  General exam: Appears calm and comfortable.  Looks older than stated age.  Awake but hardly answers any questions. Respiratory system: Bilateral decreased breath sound at bases Cardiovascular system: S1 & S2 heard, rate controlled  gastrointestinal system: Abdomen is nondistended, soft and nontender. Normal bowel sounds heard. Extremities: No cyanosis, clubbing, edema      Data Reviewed: I have personally reviewed following labs and imaging studies  CBC: Recent Labs  Lab 04/14/17 0440 04/15/17 0843 04/17/17 0248 04/18/17 0337 04/19/17 0344  WBC 8.4 21.4* 19.2* 17.1* 20.0*  NEUTROABS 7.3  --  16.1*  --   --   HGB 11.2* 13.5 12.0* 12.5* 14.2  HCT 34.1* 40.7 37.8* 37.9* 41.3  MCV 87.2  89.8 89.8 89.2 87.9  PLT 280 352 403* 391 867   Basic Metabolic Panel: Recent Labs  Lab 04/13/17 1837 04/14/17 0440 04/15/17 0843 04/17/17 0248 04/18/17 0337 04/19/17 0344  NA  --  136 140 136 135 133*  K  --  3.8 3.9 4.3 4.5 4.6  CL  --  105 103 101 99* 98*  CO2  --  23 26 26  26 24   GLUCOSE  --  137* 130* 142* 131* 112*  BUN  --  10 13 17  22* 23*  CREATININE  --  0.73 0.87 0.76 0.85 0.84  CALCIUM  --  8.7* 9.4 8.8* 8.8* 8.6*  MG 1.5* 2.0 2.2 2.2 2.3 2.4  PHOS 3.6 4.2 2.6  --  3.5 4.4   GFR: Estimated Creatinine Clearance: 112.3 mL/min (by C-G formula based on SCr of 0.84 mg/dL). Liver Function Tests: No results for input(s): AST, ALT, ALKPHOS, BILITOT, PROT, ALBUMIN in the last 168 hours. No results for input(s): LIPASE, AMYLASE in the last 168 hours. No results for input(s): AMMONIA in the last 168 hours. Coagulation Profile: No results for input(s): INR, PROTIME in the last 168 hours. Cardiac Enzymes: No results for input(s): CKTOTAL, CKMB, CKMBINDEX, TROPONINI in the last 168 hours. BNP (last 3 results) No results for input(s): PROBNP in the last 8760 hours. HbA1C: No results for input(s): HGBA1C in the last 72 hours. CBG: Recent Labs  Lab 04/19/17 1941 04/19/17 2334 04/20/17 0338 04/20/17 0825 04/20/17 1207  GLUCAP 94 95 95 89 81   Lipid Profile: No results for input(s): CHOL, HDL, LDLCALC, TRIG, CHOLHDL, LDLDIRECT in the last 72 hours. Thyroid Function Tests: No results for input(s): TSH, T4TOTAL, FREET4, T3FREE, THYROIDAB in the last 72 hours. Anemia Panel: No results for input(s): VITAMINB12, FOLATE, FERRITIN, TIBC, IRON, RETICCTPCT in the last 72 hours. Sepsis Labs: No results for input(s): PROCALCITON, LATICACIDVEN in the last 168 hours.  No results found for this or any previous visit (from the past 240 hour(s)).       Radiology Studies: Dg Chest Port 1 View  Result Date: 04/19/2017 CLINICAL DATA:  Status post CVA, seizure; history of brain malignancy. EXAM: PORTABLE CHEST 1 VIEW COMPARISON:  Chest x-ray of April 18, 2017 FINDINGS: There has been interval extubation of the trachea and esophagus. The lungs are reasonably well expanded and clear. The heart and pulmonary vascularity are normal. There is no pleural effusion. A  ventriculoperitoneal shunt tube is present to the left of midline. IMPRESSION: No active cardiopulmonary disease. Interval extubation of the trachea and esophagus. Electronically Signed   By: David  Martinique M.D.   On: 04/19/2017 09:36   Dg Swallowing Func-speech Pathology  Result Date: 04/19/2017 Objective Swallowing Evaluation: Type of Study: MBS-Modified Barium Swallow Study  Patient Details Name: Glen [name] MRN: [id number] Date of Birth: [birth year] Today's Date: 04/19/2017 Time: SLP Start Time (ACUTE ONLY): 6283 -SLP Stop Time (ACUTE ONLY): 1425 SLP Time Calculation (min) (ACUTE ONLY): 30 min Past Medical History: Past Medical History: Diagnosis Date . Brain cancer ([place])  . Hypercholesteremia  . Seizure ([place])  . Stroke ([place])  . Thyroid disorder  Past Surgical History: Past Surgical History: Procedure Laterality Date . APPENDECTOMY    1980s . BRAIN SURGERY    1993 . CRANIOTOMY Right 04/09/2017  Procedure: CRANIOTOMY HEMATOMA EVACUATION SUBDURAL;  Surgeon: Erline Levine, MD;  Location: [place];  Service: Neurosurgery;  Laterality: Right; . DIRECT LARYNGOSCOPY N/A 04/09/2017  Procedure: DIRECT LARYNGOSCOPY;  Surgeon: Erline Levine, MD;  Location: [place] [facility];  Service: Neurosurgery;  Laterality: N/A; . DIRECT LARYNGOSCOPY N/A 04/09/2017  Procedure: DIRECT LARYNGOSCOPY;  Surgeon: [name], MD;  Location: [place];  Service: ENT;  Laterality: N/A; . DIRECT LARYNGOSCOPY N/A 04/18/2017  Procedure: DIRECT LARYNGOSCOPY;  Surgeon: [name], MD;  Location: [place];  Service: ENT;  Laterality: N/A; . 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 Intubated 3/24-28; reintubated 3/28 due to stridor, notes indicate difficult intubation.  Extubated 4/2. ENT direct laryngoscopy 4/2 revealed: "inflammatory changes of the posterior glottis as expected, small nodular granulation at the anterior tracheal wall at the site of the previous tracheostomy and some curvature to the right of the trachea.  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, he has significant decline in his functional status, with rehabilitation, he was eventually able to ambulate with walker, still with very unsteady ataxic gait, loss of hearing, slurred speech, difficulty reading, he had regained significant recovery from 2016, was able to begin to read his Bible again  Subjective: alert, Assessment / Plan / Recommendation CHL IP CLINICAL IMPRESSIONS 04/19/2017 Clinical Impression Pt presents with a primary oral dysphagia marked by impaired oral motor planning with decreased initiation of lip seal, decreased coordination of bolus control, leading to diffuse spillage of material within oral cavity.  There is delayed preparation of solid materials for swallow.  Once bolus material reaches pharynx, a swallow is triggered with reliable/consistent airway protection, adequate pharyngeal contraction, no residue post-swallow.  There were no incidents of penetration nor aspiration. Recommend starting a dysphagia 1 diet with thin liquids; meds whole in puree.  Encourage self-feeding as much as able with 1:1 assist as needed.  SLP will follow for safety/diet progression.  Please order speech/language evaluation.   SLP Visit Diagnosis Dysphagia, oral phase (R13.11) Attention and  concentration deficit following -- Frontal lobe and executive function deficit following -- Impact on safety and function Mild aspiration risk   CHL IP TREATMENT RECOMMENDATION 04/19/2017 Treatment Recommendations Therapy as outlined in treatment plan below   No flowsheet data found. CHL IP DIET RECOMMENDATION 04/19/2017 SLP Diet Recommendations Dysphagia 1 (Puree) solids;Thin liquid Liquid Administration via Cup Medication Administration Whole meds with puree Compensations Small sips/bites;Minimize environmental distractions Postural Changes --   CHL IP OTHER RECOMMENDATIONS 04/19/2017 Recommended Consults -- Oral Care Recommendations Oral care BID Other Recommendations --   CHL IP FOLLOW UP RECOMMENDATIONS 04/19/2017 Follow up Recommendations Other (comment)   CHL IP FREQUENCY AND DURATION 04/19/2017 Speech Therapy Frequency (ACUTE ONLY) min 2x/week Treatment Duration 2 weeks      CHL IP ORAL PHASE 04/19/2017 Oral Phase Impaired Oral - Pudding Teaspoon -- Oral - Pudding Cup -- Oral - Honey Teaspoon -- Oral - Honey Cup -- Oral - Nectar Teaspoon -- Oral - Nectar Cup -- Oral - Nectar Straw -- Oral - Thin Teaspoon Left anterior bolus loss;Right anterior bolus loss;Incomplete tongue to palate contact;Impaired  mastication;Reduced posterior propulsion;Holding of bolus;Decreased bolus cohesion Oral - Thin Cup Left anterior bolus loss;Right anterior bolus loss;Incomplete tongue to palate contact;Impaired mastication;Reduced posterior propulsion;Holding of bolus;Decreased bolus cohesion Oral - Thin Straw -- Oral - Puree Left anterior bolus loss;Right anterior bolus loss;Incomplete tongue to palate contact;Impaired mastication;Reduced posterior propulsion;Holding of bolus;Delayed oral transit;Decreased bolus cohesion Oral - Mech Soft Left anterior bolus loss;Right anterior bolus loss;Incomplete tongue to palate contact;Impaired mastication;Reduced posterior propulsion;Holding of bolus;Decreased bolus cohesion;Delayed oral transit Oral -  Regular -- Oral - Multi-Consistency -- Oral - Pill -- Oral Phase - Comment --  CHL IP PHARYNGEAL PHASE 04/19/2017 Pharyngeal Phase Impaired Pharyngeal- Pudding Teaspoon -- Pharyngeal -- Pharyngeal- Pudding Cup -- Pharyngeal -- Pharyngeal- Honey Teaspoon -- Pharyngeal -- Pharyngeal- Honey Cup -- Pharyngeal -- Pharyngeal- Nectar Teaspoon -- Pharyngeal -- Pharyngeal- Nectar Cup -- Pharyngeal -- Pharyngeal- Nectar Straw -- Pharyngeal -- Pharyngeal- Thin Teaspoon Delayed swallow initiation-pyriform sinuses Pharyngeal -- Pharyngeal- Thin Cup Delayed swallow initiation-pyriform sinuses Pharyngeal -- Pharyngeal- Thin Straw -- Pharyngeal -- Pharyngeal- Puree -- Pharyngeal -- Pharyngeal- Mechanical Soft -- Pharyngeal -- Pharyngeal- Regular -- Pharyngeal -- Pharyngeal- Multi-consistency -- Pharyngeal -- Pharyngeal- Pill -- Pharyngeal -- Pharyngeal Comment --  No flowsheet data found. No flowsheet data found. Juan Quam Laurice 04/19/2017, 2:57 PM                   Scheduled Meds: . atorvastatin  20 mg Oral Daily  . feeding supplement (ENSURE ENLIVE)  237 mL Oral BID BM  . FLUoxetine  20 mg Oral Daily  . levothyroxine  50 mcg Oral QAC breakfast  . polyethylene glycol  17 g Oral Daily   Continuous Infusions: . sodium chloride 50 mL/hr at 04/20/17 1000  . lacosamide (VIMPAT) IV    . levETIRAcetam Stopped (04/20/17 1150)  . valproate sodium 2,000 mg (04/20/17 1300)     LOS: 11 days        Aline August, MD [facility] Pager [phone]  If 7PM-7AM, please contact night-coverage [email or web address] Password [email or web address] 04/20/2017, 1:39 PM

## 2017-04-21 DIAGNOSIS — R21 Rash and other nonspecific skin eruption: Secondary | ICD-10-CM

## 2017-04-21 LAB — BASIC METABOLIC PANEL
ANION GAP: 8 (ref 5–15)
BUN: 16 mg/dL (ref 6–20)
CALCIUM: 8 mg/dL — AB (ref 8.9–10.3)
CO2: 28 mmol/L (ref 22–32)
CREATININE: 0.98 mg/dL (ref 0.61–1.24)
Chloride: 99 mmol/L — ABNORMAL LOW (ref 101–111)
GLUCOSE: 91 mg/dL (ref 65–99)
Potassium: 4.3 mmol/L (ref 3.5–5.1)
Sodium: 135 mmol/L (ref 135–145)

## 2017-04-21 LAB — CBC WITH DIFFERENTIAL/PLATELET
BASOS ABS: 0 10*3/uL (ref 0.0–0.1)
Basophils Relative: 0 %
Eosinophils Absolute: 0.3 10*3/uL (ref 0.0–0.7)
Eosinophils Relative: 2 %
HCT: 40.5 % (ref 39.0–52.0)
Hemoglobin: 13.1 g/dL (ref 13.0–17.0)
LYMPHS ABS: 2.7 10*3/uL (ref 0.7–4.0)
Lymphocytes Relative: 18 %
MCH: 28.9 pg (ref 26.0–34.0)
MCHC: 32.3 g/dL (ref 30.0–36.0)
MCV: 89.4 fL (ref 78.0–100.0)
MONO ABS: 0.9 10*3/uL (ref 0.1–1.0)
Monocytes Relative: 6 %
Neutro Abs: 11.2 10*3/uL — ABNORMAL HIGH (ref 1.7–7.7)
Neutrophils Relative %: 74 %
PLATELETS: 327 10*3/uL (ref 150–400)
RBC: 4.53 MIL/uL (ref 4.22–5.81)
RDW: 13.7 % (ref 11.5–15.5)
WBC: 15.1 10*3/uL — AB (ref 4.0–10.5)

## 2017-04-21 LAB — GLUCOSE, CAPILLARY
GLUCOSE-CAPILLARY: 100 mg/dL — AB (ref 65–99)
GLUCOSE-CAPILLARY: 119 mg/dL — AB (ref 65–99)
GLUCOSE-CAPILLARY: 81 mg/dL (ref 65–99)
GLUCOSE-CAPILLARY: 84 mg/dL (ref 65–99)
Glucose-Capillary: 79 mg/dL (ref 65–99)
Glucose-Capillary: 87 mg/dL (ref 65–99)

## 2017-04-21 LAB — LACTIC ACID, PLASMA: Lactic Acid, Venous: 0.8 mmol/L (ref 0.5–1.9)

## 2017-04-21 LAB — MAGNESIUM: Magnesium: 2.3 mg/dL (ref 1.7–2.4)

## 2017-04-21 MED ORDER — SODIUM CHLORIDE 0.9 % IV BOLUS: 500.0000 mL | Freq: Once | INTRAVENOUS | Status: DC

## 2017-04-21 NOTE — Progress Notes (Signed)
Subjective: Patient reports alert, but nonverbal  Objective: Vital signs in last 24 hours: Temp:  [97.5 F (36.4 C)-98.5 F (36.9 C)] 98.5 F (36.9 C) (04/05 0800) Pulse Rate:  [56-87] 72 (04/05 0930) Resp:  [12-21] 20 (04/05 0930) BP: (73-122)/(44-80) 97/66 (04/05 0930) SpO2:  [95 %-100 %] 98 % (04/05 0930) Weight:  [84.7 kg (186 lb 11.7 oz)] 84.7 kg (186 lb 11.7 oz) (04/05 0500)  Intake/Output from previous day: 04/04 0701 - 04/05 0700 In: 2793.3 [P.O.:240; I.V.:2253.3; IV Piggyback:300] Out: 1875 [Urine:1875] Intake/Output this shift: Total I/O In: 657 [P.O.:357; I.V.:300] Out: 525 [Urine:525]  Physical Exam: New rash on forehead, otherwise stable.  Lab Results: Recent Labs    04/19/17 0344 04/21/17 0501  WBC 20.0* 15.1*  HGB 14.2 13.1  HCT 41.3 40.5  PLT 391 327   BMET Recent Labs    04/19/17 0344 04/21/17 0501  NA 133* 135  K 4.6 4.3  CL 98* 99*  CO2 24 28  GLUCOSE 112* 91  BUN 23* 16  CREATININE 0.84 0.98  CALCIUM 8.6* 8.0*    Studies/Results: Dg Swallowing Func-speech Pathology  Result Date: 04/19/2017 Objective Swallowing Evaluation: Type of Study: MBS-Modified Barium Swallow Study  Patient Details Name: Glen [name] MRN: [id number] Date of Birth: 03/26/1970 Today's Date: 04/19/2017 Time: SLP Start Time (ACUTE ONLY): 0630 -SLP Stop Time (ACUTE ONLY): 1425 SLP Time Calculation (min) (ACUTE ONLY): 30 min Past Medical History: Past Medical History: Diagnosis Date . Brain cancer ([place])  . Hypercholesteremia  . Seizure ([place])  . Stroke ([place])  . Thyroid disorder  Past Surgical History: Past Surgical History: Procedure Laterality Date . APPENDECTOMY    1980s . BRAIN SURGERY    1993 . CRANIOTOMY Right 04/09/2017  Procedure: CRANIOTOMY HEMATOMA EVACUATION SUBDURAL;  Surgeon: Erline Levine, MD;  Location: [place];  Service: Neurosurgery;  Laterality: Right; . DIRECT LARYNGOSCOPY N/A 04/09/2017  Procedure: DIRECT LARYNGOSCOPY;  Surgeon: Erline Levine, MD;  Location: [place];  Service: Neurosurgery;  Laterality: N/A; . DIRECT LARYNGOSCOPY N/A 04/09/2017  Procedure: DIRECT LARYNGOSCOPY;  Surgeon: [name], MD;  Location: [place];  Service: ENT;  Laterality: N/A; . DIRECT LARYNGOSCOPY N/A 04/18/2017  Procedure: DIRECT LARYNGOSCOPY;  Surgeon: [name], MD;  Location: [place];  Service: ENT;  Laterality: N/A; . 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 Intubated 3/24-28; reintubated 3/28 due to stridor, notes indicate difficult intubation.  Extubated 4/2. ENT direct laryngoscopy 4/2 revealed: "inflammatory changes of the posterior glottis as expected, small nodular granulation at the anterior tracheal wall at the site of the previous tracheostomy and some curvature to the right of the trachea.  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, he has significant decline in his functional status, with rehabilitation, he was eventually able to ambulate with walker, still with very unsteady ataxic gait, loss of hearing, slurred speech, difficulty reading, he had regained significant recovery from  2016, was able to begin to read his Bible again  Subjective: alert, Assessment / Plan / Recommendation CHL IP CLINICAL IMPRESSIONS 04/19/2017 Clinical Impression Pt presents with a primary oral  dysphagia marked by impaired oral motor planning with decreased initiation of lip seal, decreased coordination of bolus control, leading to diffuse spillage of material within oral cavity.  There is delayed preparation of solid materials for swallow.  Once bolus material reaches pharynx, a swallow is triggered with reliable/consistent airway protection, adequate pharyngeal contraction, no residue post-swallow.  There were no incidents of penetration nor aspiration. Recommend starting a dysphagia 1 diet with thin liquids; meds whole in puree.  Encourage self-feeding as much as able with 1:1 assist as needed.  SLP will follow for safety/diet progression.  Please order speech/language evaluation.   SLP Visit Diagnosis Dysphagia, oral phase (R13.11) Attention and concentration deficit following -- Frontal lobe and executive function deficit following -- Impact on safety and function Mild aspiration risk   CHL IP TREATMENT RECOMMENDATION 04/19/2017 Treatment Recommendations Therapy as outlined in treatment plan below   No flowsheet data found. CHL IP DIET RECOMMENDATION 04/19/2017 SLP Diet Recommendations Dysphagia 1 (Puree) solids;Thin liquid Liquid Administration via Cup Medication Administration Whole meds with puree Compensations Small sips/bites;Minimize environmental distractions Postural Changes --   CHL IP OTHER RECOMMENDATIONS 04/19/2017 Recommended Consults -- Oral Care Recommendations Oral care BID Other Recommendations --   CHL IP FOLLOW UP RECOMMENDATIONS 04/19/2017 Follow up Recommendations Other (comment)   CHL IP FREQUENCY AND DURATION 04/19/2017 Speech Therapy Frequency (ACUTE ONLY) min 2x/week Treatment Duration 2 weeks      CHL IP ORAL PHASE 04/19/2017 Oral Phase Impaired Oral - Pudding Teaspoon -- Oral - Pudding Cup -- Oral - Honey Teaspoon -- Oral - Honey Cup -- Oral - Nectar Teaspoon -- Oral - Nectar Cup -- Oral - Nectar Straw -- Oral - Thin Teaspoon Left anterior bolus loss;Right anterior bolus loss;Incomplete  tongue to palate contact;Impaired mastication;Reduced posterior propulsion;Holding of bolus;Decreased bolus cohesion Oral - Thin Cup Left anterior bolus loss;Right anterior bolus loss;Incomplete tongue to palate contact;Impaired mastication;Reduced posterior propulsion;Holding of bolus;Decreased bolus cohesion Oral - Thin Straw -- Oral - Puree Left anterior bolus loss;Right anterior bolus loss;Incomplete tongue to palate contact;Impaired mastication;Reduced posterior propulsion;Holding of bolus;Delayed oral transit;Decreased bolus cohesion Oral - Mech Soft Left anterior bolus loss;Right anterior bolus loss;Incomplete tongue to palate contact;Impaired mastication;Reduced posterior propulsion;Holding of bolus;Decreased bolus cohesion;Delayed oral transit Oral - Regular -- Oral - Multi-Consistency -- Oral - Pill -- Oral Phase - Comment --  CHL IP PHARYNGEAL PHASE 04/19/2017 Pharyngeal Phase Impaired Pharyngeal- Pudding Teaspoon -- Pharyngeal -- Pharyngeal- Pudding Cup -- Pharyngeal -- Pharyngeal- Honey Teaspoon -- Pharyngeal -- Pharyngeal- Honey Cup -- Pharyngeal -- Pharyngeal- Nectar Teaspoon -- Pharyngeal -- Pharyngeal- Nectar Cup -- Pharyngeal -- Pharyngeal- Nectar Straw -- Pharyngeal -- Pharyngeal- Thin Teaspoon Delayed swallow initiation-pyriform sinuses Pharyngeal -- Pharyngeal- Thin Cup Delayed swallow initiation-pyriform sinuses Pharyngeal -- Pharyngeal- Thin Straw -- Pharyngeal -- Pharyngeal- Puree -- Pharyngeal -- Pharyngeal- Mechanical Soft -- Pharyngeal -- Pharyngeal- Regular -- Pharyngeal -- Pharyngeal- Multi-consistency -- Pharyngeal -- Pharyngeal- Pill -- Pharyngeal -- Pharyngeal Comment --  No flowsheet data found. No flowsheet data found. Glen [name] 04/19/2017, 2:57 PM               Assessment/Plan: Mobilize with PT.  No new seizures.  ? Rash from Vimpat?    LOS: 12 days    [name], MD 04/21/2017, 10:58 AM

## 2017-04-21 NOTE — Progress Notes (Signed)
Patient ID: Glen [name], male   DOB: 18-Nov-1970, 47 y.o.   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  He was found to have large subdural hematoma and was transferred to [facility] under neurosurgery service.  He underwent craniotomy with hematoma evacuation.  ENT was consulted for difficult airway.  He initially failed extubation on 04/13/2017.  He had seizure-like activities overnight of 04/16/2017 and EEG performed had demonstrated diffuse nonspecific cerebral dysfunction.  Neurology was consulted.  MRI on 04/18/2017 demonstrated bilateral SDH which were stable.  Patient was extubated in OR on 04/18/2017 by ENT. PCCM has signed off.  TRH was requested to follow-up on the patient from 04/20/2017.  Patient is currently under neurosurgery service.   Assessment & Plan:   Active Problems:   Subdural hematoma (HCC)   Abnormal movements  Subdural hematoma status post evacuation -Management as per primary neurosurgery team  Acute hypoxic respiratory failure with stridor -Patient has already been extubated on 04/18/2017. PCCM has signed off. -Incentive spirometry -Patient was saturating close to 100% this morning and was on low flow nasal cannula oxygen.  Discussed with RN to wean as tolerated.  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   Continue antiepileptics as per neurology -Seizure precautions.  Aspiration precautions.  Fall precautions -Currently on IV Vimpat and Keppra.  Depakote which had been started was stopped on 4/5 due to skin rash likely related to it.  Discussed with Dr. Leonel Ramsay.  If has recurrent seizures then planning to add Trileptal.  Leukocytosis -Probably reactive from Decadron use.  Off Decadron -Improving.  Hypothyroidism -Continue Synthroid  Generalized deconditioning -PT/OT/SLP eval  Skin rash -Suspected due to Depakote allergy.  Depakote discontinued by neurology.  Monitor closely.  May need to add Depakote to list of allergies.  Subjective: Patient unable to provide history due to mental status changes and?  Aphasia.  As per RN, no seizures since morning of 4/4.  Response by opening eyes but cannot follow commands and unable to understand speech.  New left-sided face/scalp rash.  Objective: Vitals:   04/21/17 1700 04/21/17 1730 04/21/17 1800 04/21/17 1830  BP: 130/79 104/62 (!) 115/91 (!) 110/55  Pulse: 81 97 99 100  Resp: (!) 23 (!) 27 (!) 23 (!) 22  Temp:      TempSrc:      SpO2: 95% 98% 98% 97%  Weight:      Height:        Intake/Output Summary (Last 24 hours) at 04/21/2017 1855 Last data filed at 04/21/2017 1800 Gross per 24 hour  Intake 3502 ml  Output 2245 ml  Net 1257 ml   Filed Weights   04/19/17 0500 04/20/17 0400 04/21/17 0500  Weight: 85.8 kg (189 lb 2.5 oz) 83.6 kg (184 lb 4.9 oz) 84.7 kg (186 lb 11.7 oz)    Examination:  General exam: Young male, looks older than stated age, lying comfortably supine in bed. Respiratory system: Bilateral decreased breath sound at bases.  Rest clear to auscultation.  No increased work of breathing. Cardiovascular system: S1 & S2 heard, rate controlled.  RRR.  No JVD, murmurs or pedal edema.  Telemetry: Sinus rhythm. gastrointestinal system: Abdomen is nondistended, soft and nontender. Normal bowel sounds heard. Extremities: No  cyanosis, clubbing, edema. Moves all limbs symmetrically. CNS: Sleeping.  Opened eyes to call.  Unable to understand speech.  Does not follow commands. Skin: Erythematous maculopapular rash left side of face, neck and left parietal scalp.  Right parietal scalp surgical staples intact.      Data Reviewed: I have personally reviewed following labs and imaging studies  CBC: Recent Labs  Lab 04/15/17 0843 04/17/17 0248 04/18/17 0337 04/19/17 0344 04/21/17 0501  WBC 21.4* 19.2* 17.1* 20.0* 15.1*  NEUTROABS  --  16.1*  --   --  11.2*  HGB 13.5 12.0* 12.5* 14.2 13.1  HCT 40.7 37.8* 37.9* 41.3 40.5  MCV 89.8 89.8 89.2 87.9 89.4  PLT 352 403* 391 391 725   Basic Metabolic Panel: Recent Labs  Lab 04/15/17 0843 04/17/17 0248 04/18/17 0337 04/19/17 0344 04/21/17 0501  NA 140 136 135 133* 135  K 3.9 4.3 4.5 4.6 4.3  CL 103 101 99* 98* 99*  CO2 26 26 26 24 28   GLUCOSE 130* 142* 131* 112* 91  BUN 13 17 22* 23* 16  CREATININE 0.87 0.76 0.85 0.84 0.98  CALCIUM 9.4 8.8* 8.8* 8.6* 8.0*  MG 2.2 2.2 2.3 2.4 2.3  PHOS 2.6  --  3.5 4.4  --    GFR: Estimated Creatinine Clearance: 96.2 mL/min (by C-G formula based on SCr of 0.98 mg/dL). CBG: Recent Labs  Lab 04/21/17 0026 04/21/17 0406 04/21/17 0729 04/21/17 1118 04/21/17 1542  GLUCAP 84 79 81 87 100*   Sepsis Labs: Recent Labs  Lab 04/20/17 2358  LATICACIDVEN 0.8    No results found for this or any previous visit (from the past 240 hour(s)).       Radiology Studies: No results found.      Scheduled Meds: . atorvastatin  20 mg Oral Daily  . feeding supplement (ENSURE ENLIVE)  237 mL Oral BID BM  . FLUoxetine  20 mg Oral Daily  . levothyroxine  50 mcg Oral QAC breakfast  . polyethylene glycol  17 g Oral Daily   Continuous Infusions: . sodium chloride Stopped (04/21/17 0207)  . dextrose 5 % and 0.9% NaCl 100 mL/hr at 04/21/17 1800  . lacosamide (VIMPAT) IV Stopped (04/21/17 1020)  . levETIRAcetam Stopped  (04/21/17 1004)  . sodium chloride       LOS: 12 days    Vernell Leep, MD, FACP, [facility]. [facility] Pager [phone]  If 7PM-7AM, please contact night-coverage [email or web address] Password [email or web address] 04/21/2017, 7:01 PM

## 2017-04-21 NOTE — Progress Notes (Signed)
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 Intubated 3/24-28; reintubated 3/28 due to stridor, notes indicate difficult intubation.  Extubated 4/2. ENT direct laryngoscopy 4/2 revealed: "inflammatory changes of the posterior glottis as expected, small nodular granulation at the anterior tracheal wall at the site of the previous tracheostomy and some curvature to the right of the trachea.  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, he has significant decline in his functional status, with rehabilitation, he was eventually able to ambulate with walker, still with very unsteady ataxic gait, loss of hearing, slurred speech, difficulty reading, he had regained significant recovery from 2016, was able to begin to read his Bible again  Respiratory Status Supplemental O2 delivered via (comment)  Oral Cavity - Dentition Missing dentition  Patient observed directly with PO's Yes  Type of PO's observed Dysphagia 3 (soft);Thin liquids  Feeding Total assist  Liquids provided via Straw;Cup  Treatment Provided  Treatment provided Dysphagia;Cognitive-Linquistic  Dysphagia/Cognitive Linguistic Treatment: Pt exhibits improvements in mentation this date as compared to prior ST interactions (per notes). Trialed  upgraded PO textures including mechanical soft (peaches this date). Rotary chewing and significantly prolonged mastication noted without functional bolus cohesion or adequate propulsion of bolus towards the pharynx. Residuals of soft solids remained or oral cavity, SLP extracted. Dysphagia 1 (puree) remains the safest diet until functional mastication of solid PO can be achieved orally. Thin liquids were also assessed with suspected decreased motor planning. Oral discoordination noted. Pt responsive to simple questions this date, stated last name, and current city. Aphasia perists with perseverations, hesitations, and decreased expressive output at increased length. ST to continue to monitor.    Treatment Methods Skilled observation;Upgraded PO texture trial;Therapeutic exercise  Oral Phase Signs & Symptoms Prolonged bolus formation;Other (comment) (rotary chew, with bolus residuals on tongue, discoordinated)  Pharyngeal Phase Signs & Symptoms Suspected delayed swallow initiation  Type of cueing Verbal;Visual;Tactile  Amount of cueing Moderate  Cognitive-Linquistic Treatment  Treatment focused on Cognition;Aphasia  Skilled Treatment see note   SLP - End of Session  Patient left in bed;with call bell/phone within reach  Nurse Communication Swallow strategies reviewed;Treatment plan;Cognitive/Linguistic strategies reviewed;Aspiration precautions reviewed;Diet recommendation  Assessment / Recommendations / Plan  Plan Continue with current plan of care  Dysphagia Recommendations  Diet recommendations Dysphagia 1 (puree);Thin liquid  Liquids provided via Straw;Cup  Medication Administration Crushed with puree  Supervision Staff to assist with self feeding;Full supervision/cueing for compensatory strategies  Compensations Small sips/bites;Minimize environmental distractions  Postural Changes and/or Swallow Maneuvers Seated upright 90 degrees  General Recommendations  Oral Care Recommendations Oral  care BID  Follow up Recommendations Inpatient Rehab  SLP Visit Diagnosis Dysphagia, oropharyngeal phase (R13.12)  Attention and concentration deficit following Nontraumatic intracerebral hemorrhage  Progression Toward Goals  Progression toward goals Progressing toward goals  Potential to Achieve Goals (ACUTE ONLY) Fair  Potential Considerations (ACUTE ONLY) Severity of impairments;Co-morbidities;Previous level of  function  SLP Time Calculation  SLP Start Time (ACUTE ONLY) 1445  SLP Stop Time (ACUTE ONLY) 1510  SLP Time Calculation (min) (ACUTE ONLY) 25 min  SLP Evaluations  $ SLP Speech Visit 1 Visit  SLP Evaluations  $Swallowing Treatment 1 Procedure    Anarie Kalish MA, CCC-SLP Acute Care Speech Language Pathologist

## 2017-04-21 NOTE — Progress Notes (Signed)
Subjective: No further seizures since yesterday  Exam: Vitals:   04/21/17 1900 04/21/17 1930  BP: 114/86 110/73  Pulse: 99 96  Resp: (!) 23 (!) 24  Temp:    SpO2: 98% 95%   Gen: In bed, NAD Resp: non-labored breathing, no acute distress Abd: soft, nt Skin: Patchy maculopapular rash   Neuro: MS: Awake, alert, engages with the examiner but does not follow commands. CN: Extraocular movements intact, no facial twitching Motor: He moves all extremities with good strength, no clonic activity in the limbs.   Impression: 47 year old male with subdural hematoma an  with recurrent partial seizures.  Yesterday, Depakote was needed to abort his status epilepticus, but of the agent started this is the most likely to cause his maculopapular rash.  I would be hesitant to continue this and think that [place] with Vimpat at a higher dose may be adequate at preventing seizures.  If he were to have any further episodes then I would favor starting Trileptal.  Recommendations: 1) Continue Keppra 2) Vimpat 200 mill grams twice daily 3) discontinue Depakote  4) will follow  Roland Rack, MD [facility] [phone]  If 7pm- 7am, please page neurology on call as listed in [place].

## 2017-04-22 ENCOUNTER — Other Ambulatory Visit: Payer: Self-pay

## 2017-04-22 LAB — GLUCOSE, CAPILLARY
GLUCOSE-CAPILLARY: 100 mg/dL — AB (ref 65–99)
GLUCOSE-CAPILLARY: 105 mg/dL — AB (ref 65–99)
GLUCOSE-CAPILLARY: 91 mg/dL (ref 65–99)
GLUCOSE-CAPILLARY: 92 mg/dL (ref 65–99)
Glucose-Capillary: 88 mg/dL (ref 65–99)
Glucose-Capillary: 104 mg/dL — ABNORMAL HIGH (ref 65–99)

## 2017-04-22 NOTE — Progress Notes (Signed)
Subjective: No further seizures   Exam: Vitals:   04/22/17 1200 04/22/17 1300  BP: 116/84 126/84  Pulse: 93 (!) 101  Resp: (!) 24 (!) 29  Temp: 98.8 F (37.1 C)   SpO2: 92% 94%   Gen: In bed, NAD Resp: non-labored breathing, no acute distress Abd: soft, nt Skin: Patchy maculopapular rash, slightly more extensie, but less erythematous than yesterday   Neuro: MS: Awake, alert, engages with the examiner, gives thumbs up to command.  CN: Extraocular movements intact, no facial twitching Motor: He moves all extremities with good strength, no clonic activity in the limbs.   Impression: 47 year old male with subdural hematoma an  with recurrent partial seizures.  Yesterday, Depakote was needed to abort his status epilepticus, but of the agent started this is the most likely to cause his maculopapular rash.  Keppra with Vimpat at a higher dose than he was on prior to his status epilepticus may be adequate at preventing seizures.  If he were to have any further episodes then I would favor starting Trileptal.  Recommendations: 1) Continue Keppra 1.5g BID 2) Vimpat 200 mill grams twice daily 3) will follow  Roland Rack, MD [facility] [phone]  If 7pm- 7am, please page neurology on call as listed in [place].

## 2017-04-22 NOTE — Progress Notes (Signed)
Patient ID: Glen [name], male   DOB: October 27, 1970, 47 y.o.   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  He was found to have large subdural hematoma and was transferred to [facility] under neurosurgery service.  He underwent craniotomy with hematoma evacuation.  ENT was consulted for difficult airway.  He initially failed extubation on 04/13/2017.  He had seizure-like activities overnight of 04/16/2017 and EEG performed had demonstrated diffuse nonspecific cerebral dysfunction.  Neurology was consulted.  MRI on 04/18/2017 demonstrated bilateral SDH which were stable.  Patient was extubated in OR on 04/18/2017 by ENT. PCCM has signed off.  TRH was requested to follow-up on the patient from 04/20/2017.  Patient is currently under neurosurgery service.   Assessment & Plan:   Active Problems:   Subdural hematoma (HCC)   Abnormal movements  Subdural hematoma status post evacuation -Management as per primary neurosurgery team  Acute hypoxic respiratory failure with stridor -Patient was extubated on 04/18/2017. PCCM has signed off. -Incentive spirometry -Hypoxia resolved.  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  Continue antiepileptics as per neurology -Seizure precautions.  Aspiration precautions.  Fall  precautions -Currently on IV Vimpat at higher dose than he was on prior to his status epilepticus and Keppra.  Depakote which had been started to abort his status epilepticus on 4/4 had to be discontinued on 4/5 due to skin rash likely related to it.  Discussed with Dr. Leonel Ramsay today.  If has recurrent seizures then planning to add Trileptal.  Leukocytosis -Probably reactive from Decadron use.  Off Decadron -Improving.  Hypothyroidism -Continue Synthroid  Generalized deconditioning -PT/OT/SLP eval  Skin rash -Suspected due to Depakote allergy.  Depakote discontinued by neurology.  Monitor closely.  May need to add Depakote to list of allergies but not fully certain regarding allergy at.  Skin rash appears more extensive but less acute than 4/6.  Subjective: Confused & unable to provide history. Does make a thumbs up sign. No seizures reported since 4/4.  As per RN, no acute issues reported.  Objective: Vitals:   04/22/17 1200 04/22/17 1300 04/22/17 1400 04/22/17 1500  BP: 116/84 126/84 124/87 122/78  Pulse: 93 (!) 101 78 90  Resp: (!) 24 (!) 29 (!) 22 19  Temp: 98.8 F (37.1 C)     TempSrc: Oral     SpO2: 92% 94% 97% 96%  Weight:      Height:        Intake/Output Summary (Last 24 hours) at 04/22/2017 1537 Last data filed at 04/22/2017 1500 Gross per 24 hour  Intake 3510 ml  Output 2580 ml  Net 930 ml   Filed Weights   04/20/17 0400 04/21/17 0500 04/22/17 0500  Weight: 83.6 kg (184 lb 4.9 oz) 84.7 kg (186 lb 11.7 oz) 84.1 kg (185 lb 6.5 oz)    Examination:  General exam: Young male, looks older than stated age, seen sitting up in bed and trying to pull off his SCDs. Respiratory  system: Clear to auscultation.  No increased work of breathing. Cardiovascular system: S1 & S2 heard, rate controlled.  RRR.  No JVD, murmurs or pedal edema.  Telemetry personally reviewed: Sinus rhythm. gastrointestinal system: Abdomen is nondistended, soft and nontender. Normal bowel sounds  heard.  Stable. Extremities: No cyanosis, clubbing, edema. Moves all limbs symmetrically. CNS: Alert and only follows rare instructions i.e thumbs up.  Unable to understand speech.  Does not follow commands. Skin: Erythematous maculopapular rash left side of face, neck and left parietal scalp.  Right parietal scalp surgical staples intact.  Skin rash over left side of face/neck and parietal scalp is less erythematous.  Does have a couple of spots of rash over upper chest.  None on the back.      Data Reviewed: I have personally reviewed following labs and imaging studies  CBC: Recent Labs  Lab 04/17/17 0248 04/18/17 0337 04/19/17 0344 04/21/17 0501  WBC 19.2* 17.1* 20.0* 15.1*  NEUTROABS 16.1*  --   --  11.2*  HGB 12.0* 12.5* 14.2 13.1  HCT 37.8* 37.9* 41.3 40.5  MCV 89.8 89.2 87.9 89.4  PLT 403* 391 391 034   Basic Metabolic Panel: Recent Labs  Lab 04/17/17 0248 04/18/17 0337 04/19/17 0344 04/21/17 0501  NA 136 135 133* 135  K 4.3 4.5 4.6 4.3  CL 101 99* 98* 99*  CO2 26 26 24 28   GLUCOSE 142* 131* 112* 91  BUN 17 22* 23* 16  CREATININE 0.76 0.85 0.84 0.98  CALCIUM 8.8* 8.8* 8.6* 8.0*  MG 2.2 2.3 2.4 2.3  PHOS  --  3.5 4.4  --    GFR: Estimated Creatinine Clearance: 96.2 mL/min (by C-G formula based on SCr of 0.98 mg/dL). CBG: Recent Labs  Lab 04/21/17 2023 04/22/17 0007 04/22/17 0719 04/22/17 1125 04/22/17 1519  GLUCAP 119* 100* 88 91 92   Sepsis Labs: Recent Labs  Lab 04/20/17 2358  LATICACIDVEN 0.8    No results found for this or any previous visit (from the past 240 hour(s)).       Radiology Studies: No results found.      Scheduled Meds: . atorvastatin  20 mg Oral Daily  . feeding supplement (ENSURE ENLIVE)  237 mL Oral BID BM  . FLUoxetine  20 mg Oral Daily  . levothyroxine  50 mcg Oral QAC breakfast  . polyethylene glycol  17 g Oral Daily   Continuous Infusions: . sodium chloride Stopped (04/21/17 0207)  . dextrose 5 % and  0.9% NaCl 100 mL/hr at 04/22/17 1000  . lacosamide (VIMPAT) IV Stopped (04/22/17 1053)  . levETIRAcetam Stopped (04/22/17 1009)  . sodium chloride       LOS: 13 days    Vernell Leep, MD, FACP, [facility]. [facility] Pager [phone]  If 7PM-7AM, please contact night-coverage [email or web address] Password [email or web address] 04/22/2017, 3:37 PM

## 2017-04-22 NOTE — Progress Notes (Signed)
NEUROSURGERY PROGRESS NOTE  Doing well. Still has some dysarthria Incision CDI  Temp:  [98.3 F (36.8 C)-99.3 F (37.4 C)] 98.4 F (36.9 C) (04/06 0800) Pulse Rate:  [42-111] 88 (04/06 0800) Resp:  [16-27] 23 (04/06 0800) BP: (73-130)/(44-91) 111/75 (04/06 0800) SpO2:  [90 %-100 %] 99 % (04/06 0800) Weight:  [84.1 kg (185 lb 6.5 oz)] 84.1 kg (185 lb 6.5 oz) (04/06 0500)  Plan: D/C staples. Continue therapies  Eleonore Chiquito, NP 04/22/2017 10:02 AM

## 2017-04-23 LAB — GLUCOSE, CAPILLARY
GLUCOSE-CAPILLARY: 104 mg/dL — AB (ref 65–99)
GLUCOSE-CAPILLARY: 111 mg/dL — AB (ref 65–99)
GLUCOSE-CAPILLARY: 86 mg/dL (ref 65–99)
Glucose-Capillary: 97 mg/dL (ref 65–99)
Glucose-Capillary: 130 mg/dL — ABNORMAL HIGH (ref 65–99)

## 2017-04-23 MED ORDER — LEVETIRACETAM 750 MG PO TABS
1500.0000 mg | ORAL_TABLET | Freq: Two times a day (BID) | ORAL | Status: DC
  Administered 2017-04-23: 1500 mg via ORAL
  Filled 2017-04-23 (×2): qty 2

## 2017-04-23 MED ORDER — LACOSAMIDE 200 MG PO TABS
200.0000 mg | ORAL_TABLET | Freq: Two times a day (BID) | ORAL | Status: DC
  Administered 2017-04-23 – 2017-04-24 (×2): 200 mg via ORAL
  Filled 2017-04-23 (×2): qty 1

## 2017-04-23 NOTE — Progress Notes (Signed)
1900: Handoff report received from RN. Pt resting in bed with 4 family members visiting at bedside; advised about visitor policy of 2 at a time. Family asking about pt's glasses which are not with him in the room. Will follow up. Discussed plan of care for the shift; pt amenable to plan.   0000: Pt resting comfortably.  0400: Pt continues resting comfortably.  0700: Handoff report given to RN. No acute events overnight.

## 2017-04-23 NOTE — Progress Notes (Signed)
Patient ID: Glen [name], male   DOB: November 21, 1970, 47 y.o.   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  He was found to have large subdural hematoma and was transferred to [facility] under neurosurgery service.  He underwent craniotomy with hematoma evacuation.  ENT was consulted for difficult airway.  He initially failed extubation on 04/13/2017.  He had seizure-like activities overnight of 04/16/2017 and EEG performed had demonstrated diffuse nonspecific cerebral dysfunction.  Neurology was consulted.  MRI on 04/18/2017 demonstrated bilateral SDH which were stable.  Patient was extubated in OR on 04/18/2017 by ENT. PCCM has signed off.  TRH was requested to follow-up on the patient from 04/20/2017.  Patient is currently under neurosurgery service.   Assessment & Plan:   Active Problems:   Subdural hematoma (HCC)   Abnormal movements  Subdural hematoma status post evacuation -Management as per primary neurosurgery team  Acute hypoxic respiratory failure with stridor -Patient was extubated on 04/18/2017. PCCM has signed off. -Incentive spirometry -Hypoxia resolved.  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  Continue antiepileptics as per neurology -Seizure precautions.  Aspiration precautions.  Fall  precautions -Currently on IV Vimpat at higher dose than he was on prior to his status epilepticus and Keppra.  Depakote which had been started to abort his status epilepticus on 4/4 had to be discontinued on 4/5 due to skin rash likely related to it.   -Discussed with Dr. Leonel Ramsay, Neurology and please refer to his sign off note 4/7 for details.  Skin rash improving.  In summary he recommends continuing Keppra 1.5 g twice daily + Vimpat 200 mg twice daily.  If rash worsens or fails to continue improving, could consider changing Vimpat to Lyrica (would start at 100 mg twice daily).  If rash is improving, but has further breakthrough seizures, then recommends adding Lyrica as a third agent.  He states that patient was on Depakote PTA but sometimes large doses may cause side effects that slower introduction does not.  Since patient developed rash with 2 different AEDs at this point (phenytoin and Depakote) he recommends avoiding an agent less likely to cause a rash.  Hence instead of Trileptal previously recommended, consider Lyrica if needed.  Leukocytosis -Probably reactive from Decadron use.  Off Decadron -Improving.  Hypothyroidism -Continue Synthroid  Generalized deconditioning -PT/OT/SLP eval  Skin rash -Suspected due to Depakote allergy.  Depakote discontinued by neurology.  Monitor closely.  May need to add Depakote to list of allergies but not fully certain regarding allergy at.  Skin rash appears to be improving.  Subjective: No further seizures since 4/4.  No change in mental status.  As per RN, no acute issues.  Objective: Vitals:   04/23/17 1000 04/23/17 1100 04/23/17 1158 04/23/17 1200  BP: 126/72 104/70  104/72  Pulse: 86 73  75  Resp: (!) 23 19  (!) 21  Temp:   (!) 97.5 F (36.4 C)   TempSrc:   Axillary  SpO2: 92% 98%  98%  Weight:      Height:        Intake/Output Summary (Last 24 hours) at 04/23/2017 1345 Last data filed at 04/23/2017 1200 Gross per 24 hour  Intake  3426 ml  Output 4020 ml  Net -594 ml   Filed Weights   04/21/17 0500 04/22/17 0500 04/23/17 0500  Weight: 84.7 kg (186 lb 11.7 oz) 84.1 kg (185 lb 6.5 oz) 83.4 kg (183 lb 13.8 oz)    Examination:  General exam: Young male, looks older than stated age, lying comfortably supine in bed. Respiratory system: Clear to auscultation.  No increased work of breathing. Cardiovascular system: S1 & S2 heard, rate controlled.  RRR.  No JVD, murmurs or pedal edema.  Telemetry personally reviewed: Sinus rhythm. gastrointestinal system: Abdomen is nondistended, soft and nontender. Normal bowel sounds heard.  Stable. Extremities: No cyanosis, clubbing, edema. Moves all limbs symmetrically. CNS: Alert and only follows rare instructions i.e thumbs up.  Unable to understand speech.  Does not follow commands. Skin: Erythematous maculopapular rash left side of face, neck and left parietal scalp.  Right parietal scalp surgical staples intact.  Skin rash over left side of face/neck and parietal scalp is less erythematous.  Does have a couple of spots of rash over upper chest.  None on the back.  Improving compared to 2 days ago.      Data Reviewed: I have personally reviewed following labs and imaging studies  CBC: Recent Labs  Lab 04/17/17 0248 04/18/17 0337 04/19/17 0344 04/21/17 0501  WBC 19.2* 17.1* 20.0* 15.1*  NEUTROABS 16.1*  --   --  11.2*  HGB 12.0* 12.5* 14.2 13.1  HCT 37.8* 37.9* 41.3 40.5  MCV 89.8 89.2 87.9 89.4  PLT 403* 391 391 629   Basic Metabolic Panel: Recent Labs  Lab 04/17/17 0248 04/18/17 0337 04/19/17 0344 04/21/17 0501  NA 136 135 133* 135  K 4.3 4.5 4.6 4.3  CL 101 99* 98* 99*  CO2 26 26 24 28   GLUCOSE 142* 131* 112* 91  BUN 17 22* 23* 16  CREATININE 0.76 0.85 0.84 0.98  CALCIUM 8.8* 8.8* 8.6* 8.0*  MG 2.2 2.3 2.4 2.3  PHOS  --  3.5 4.4  --    GFR: Estimated Creatinine Clearance: 96.2 mL/min (by C-G formula based on SCr of 0.98 mg/dL). CBG: Recent Labs   Lab 04/22/17 1519 04/22/17 2028 04/22/17 2346 04/23/17 0416 04/23/17 0819  GLUCAP 92 104* 105* 104* 97   Sepsis Labs: Recent Labs  Lab 04/20/17 2358  LATICACIDVEN 0.8    No results found for this or any previous visit (from the past 240 hour(s)).       Radiology Studies: No results found.      Scheduled Meds: . atorvastatin  20 mg Oral Daily  . feeding supplement (ENSURE ENLIVE)  237 mL Oral BID BM  . FLUoxetine  20 mg Oral Daily  . levothyroxine  50 mcg Oral QAC breakfast  . polyethylene glycol  17 g Oral Daily   Continuous Infusions: . sodium chloride Stopped (04/21/17 0207)  . dextrose 5 % and 0.9% NaCl 100 mL/hr at 04/23/17 0600  . lacosamide (VIMPAT) IV Stopped (04/23/17 1118)  . levETIRAcetam Stopped (04/23/17 1103)  . sodium chloride       LOS: 14 days    Vernell Leep, MD, FACP, [facility]. [facility] Pager [phone]  If 7PM-7AM, please contact night-coverage [email or web address] Password [email or web address] 04/23/2017, 1:45 PM

## 2017-04-23 NOTE — Progress Notes (Signed)
NEUROSURGERY PROGRESS NOTE  Patient resting comfortably in bed. No issues overnight. Incision CDI.   Temp:  [97.6 F (36.4 C)-98.8 F (37.1 C)] 97.6 F (36.4 C) (04/07 0418) Pulse Rate:  [78-123] 93 (04/07 0600) Resp:  [18-38] 18 (04/07 0600) BP: (97-132)/(52-99) 119/79 (04/07 0600) SpO2:  [86 %-100 %] 98 % (04/07 0600) Weight:  [83.4 kg (183 lb 13.8 oz)] 83.4 kg (183 lb 13.8 oz) (04/07 0500)  Plan: No new nsgy recom at this time.   Eleonore Chiquito, NP 04/23/2017 7:49 AM

## 2017-04-23 NOTE — Progress Notes (Signed)
Spoke with Dr. Algis Liming and Dr. Tobias Alexander. Both stated Glen [name] can transfer to the floor. Order to transfer to 4NP received from Margo Aye, NP.

## 2017-04-23 NOTE — Progress Notes (Addendum)
Subjective: No further seizures, rash appears to be improving.   Exam: Vitals:   04/23/17 0700 04/23/17 0800  BP: 109/78 104/76  Pulse: 83 86  Resp: 16 18  Temp:  98.1 F (36.7 C)  SpO2: 98% 94%   Gen: In bed, NAD Resp: non-labored breathing, no acute distress Abd: soft, nt Skin: Patchy maculopapular rash, slightly more extensie, but less erythematous than yesterday   Neuro: MS: Awake, alert, engages with the examiner, gives thumbs up to command. Does not follow other commands consistently.  CN: Extraocular movements intact, no facial twitching Motor: He moves all extremities with good strength, no clonic activity in the limbs.   Impression: 47 year old male with subdural hematoma an  with recurrent partial seizures.  Depakote was needed to abort his status epilepticus, but of the agents started this is the most likely to cause his maculopapular rash.  He was on it orally prior, but sometimes large loads will cause side effects that slower introduction does not.   Keppra with Vimpat at a higher dose than he was on prior to his status epilepticus may be adequate at preventing seizures.  If he were to have any further episodes then I would typically favor starting Trileptal, however since he has developed rash with two different AEDs at this point(phenytoin and depakote) I think using an agent less likely to cause rash like lyrica may be more prudent.   Recommendations: 1) Continue Keppra 1.5g BID 2) Vimpat 200 mill grams twice daily 3) if rash worsens, or fails to continue improving, could consider change from vimpat to lyrica(would start at 100mg  BID)  4) If rash is improving, but has further breakthrough seizures, would add lyrica as a third agent.  5) He has now been seizure free for > 48 hours. Neurology will be available as needed, please call with further events or questions.    Roland Rack, MD [facility] [phone]  If 7pm- 7am, please page  neurology on call as listed in [place].

## 2017-04-24 ENCOUNTER — Inpatient Hospital Stay (HOSPITAL_COMMUNITY)
Admission: RE | Admit: 2017-04-24 | Discharge: 2017-05-16 | DRG: 945 | Disposition: A | Discharge status: Home Health | Payer: Medicare Other | Source: Intra-hospital | Attending: Physical Medicine & Rehabilitation | Admitting: Physical Medicine & Rehabilitation

## 2017-04-24 DIAGNOSIS — G40909 Epilepsy, unspecified, not intractable, without status epilepticus: Secondary | ICD-10-CM

## 2017-04-24 DIAGNOSIS — S065XAA Traumatic subdural hemorrhage with loss of consciousness status unknown, initial encounter: Secondary | ICD-10-CM | POA: Diagnosis present

## 2017-04-24 DIAGNOSIS — K5900 Constipation, unspecified: Secondary | ICD-10-CM | POA: Diagnosis present

## 2017-04-24 DIAGNOSIS — R569 Unspecified convulsions: Secondary | ICD-10-CM | POA: Diagnosis not present

## 2017-04-24 DIAGNOSIS — S065X9D Traumatic subdural hemorrhage with loss of consciousness of unspecified duration, subsequent encounter: Secondary | ICD-10-CM | POA: Diagnosis not present

## 2017-04-24 DIAGNOSIS — Z8661 Personal history of infections of the central nervous system: Secondary | ICD-10-CM

## 2017-04-24 DIAGNOSIS — E785 Hyperlipidemia, unspecified: Secondary | ICD-10-CM | POA: Diagnosis present

## 2017-04-24 DIAGNOSIS — R1312 Dysphagia, oropharyngeal phase: Secondary | ICD-10-CM | POA: Diagnosis not present

## 2017-04-24 DIAGNOSIS — E039 Hypothyroidism, unspecified: Secondary | ICD-10-CM | POA: Diagnosis present

## 2017-04-24 DIAGNOSIS — Z22322 Carrier or suspected carrier of Methicillin resistant Staphylococcus aureus: Secondary | ICD-10-CM | POA: Diagnosis not present

## 2017-04-24 DIAGNOSIS — Z982 Presence of cerebrospinal fluid drainage device: Secondary | ICD-10-CM | POA: Diagnosis not present

## 2017-04-24 DIAGNOSIS — R296 Repeated falls: Secondary | ICD-10-CM | POA: Diagnosis present

## 2017-04-24 DIAGNOSIS — S065X9A Traumatic subdural hemorrhage with loss of consciousness of unspecified duration, initial encounter: Secondary | ICD-10-CM | POA: Diagnosis present

## 2017-04-24 DIAGNOSIS — S065X0D Traumatic subdural hemorrhage without loss of consciousness, subsequent encounter: Secondary | ICD-10-CM | POA: Diagnosis not present

## 2017-04-24 DIAGNOSIS — E871 Hypo-osmolality and hyponatremia: Secondary | ICD-10-CM

## 2017-04-24 DIAGNOSIS — R1314 Dysphagia, pharyngoesophageal phase: Secondary | ICD-10-CM | POA: Diagnosis not present

## 2017-04-24 DIAGNOSIS — Z85841 Personal history of malignant neoplasm of brain: Secondary | ICD-10-CM | POA: Diagnosis not present

## 2017-04-24 DIAGNOSIS — S065X2S Traumatic subdural hemorrhage with loss of consciousness of 31 minutes to 59 minutes, sequela: Secondary | ICD-10-CM

## 2017-04-24 DIAGNOSIS — S065X3S Traumatic subdural hemorrhage with loss of consciousness of 1 hour to 5 hours 59 minutes, sequela: Secondary | ICD-10-CM | POA: Diagnosis not present

## 2017-04-24 DIAGNOSIS — R1310 Dysphagia, unspecified: Secondary | ICD-10-CM

## 2017-04-24 DIAGNOSIS — C719 Malignant neoplasm of brain, unspecified: Secondary | ICD-10-CM

## 2017-04-24 DIAGNOSIS — Z8673 Personal history of transient ischemic attack (TIA), and cerebral infarction without residual deficits: Secondary | ICD-10-CM

## 2017-04-24 DIAGNOSIS — R4189 Other symptoms and signs involving cognitive functions and awareness: Secondary | ICD-10-CM | POA: Diagnosis present

## 2017-04-24 DIAGNOSIS — W19XXXD Unspecified fall, subsequent encounter: Secondary | ICD-10-CM | POA: Diagnosis present

## 2017-04-24 LAB — GLUCOSE, CAPILLARY
GLUCOSE-CAPILLARY: 103 mg/dL — AB (ref 65–99)
Glucose-Capillary: 80 mg/dL (ref 65–99)
Glucose-Capillary: 92 mg/dL (ref 65–99)

## 2017-04-24 MED ORDER — DOCUSATE SODIUM 50 MG/5ML PO LIQD: 100.0000 mg | Freq: Two times a day (BID) | ORAL | Status: DC | PRN

## 2017-04-24 MED ORDER — LEVOTHYROXINE SODIUM 50 MCG PO TABS: 50.0000 ug | ORAL_TABLET | Freq: Every day | ORAL | 3 refills | Status: DC

## 2017-04-24 MED ORDER — ACETAMINOPHEN 325 MG PO TABS
650.0000 mg | ORAL_TABLET | ORAL | Status: DC | PRN
  Administered 2017-04-26 – 2017-05-01 (×4): 650 mg via ORAL
  Filled 2017-04-24 (×4): qty 2

## 2017-04-24 MED ORDER — BISACODYL 10 MG RE SUPP: 10.0000 mg | Freq: Every day | RECTAL | 0 refills | Status: DC | PRN

## 2017-04-24 MED ORDER — LACOSAMIDE 50 MG PO TABS
200.0000 mg | ORAL_TABLET | Freq: Two times a day (BID) | ORAL | Status: DC
  Administered 2017-04-24 – 2017-05-16 (×44): 200 mg via ORAL
  Filled 2017-04-24 (×45): qty 4

## 2017-04-24 MED ORDER — LEVETIRACETAM 100 MG/ML PO SOLN
1500.0000 mg | Freq: Two times a day (BID) | ORAL | Status: DC
  Administered 2017-04-24: 1500 mg via ORAL
  Filled 2017-04-24: qty 15

## 2017-04-24 MED ORDER — ONDANSETRON HCL 4 MG/2ML IJ SOLN: 4.0000 mg | INTRAMUSCULAR | Status: DC | PRN

## 2017-04-24 MED ORDER — FLUOXETINE HCL 20 MG PO CAPS
20.0000 mg | ORAL_CAPSULE | Freq: Every day | ORAL | Status: DC
  Administered 2017-04-25 – 2017-05-16 (×22): 20 mg via ORAL
  Filled 2017-04-24 (×22): qty 1

## 2017-04-24 MED ORDER — LEVETIRACETAM 100 MG/ML PO SOLN
1500.0000 mg | Freq: Two times a day (BID) | ORAL | Status: DC
  Administered 2017-04-24 – 2017-05-16 (×44): 1500 mg via ORAL
  Filled 2017-04-24 (×45): qty 15

## 2017-04-24 MED ORDER — SORBITOL 70 % SOLN
30.0000 mL | Freq: Every day | Status: DC | PRN
  Administered 2017-04-27: 30 mL via ORAL
  Filled 2017-04-24: qty 30

## 2017-04-24 MED ORDER — POLYETHYLENE GLYCOL 3350 17 G PO PACK
17.0000 g | PACK | Freq: Every day | ORAL | Status: DC
  Administered 2017-04-25 – 2017-05-16 (×21): 17 g via ORAL
  Filled 2017-04-24 (×21): qty 1

## 2017-04-24 MED ORDER — BISACODYL 10 MG RE SUPP: 10.0000 mg | Freq: Every day | RECTAL | Status: DC | PRN

## 2017-04-24 MED ORDER — SENNOSIDES-DOCUSATE SODIUM 8.6-50 MG PO TABS: 1.0000 | ORAL_TABLET | Freq: Every evening | ORAL | Status: DC | PRN

## 2017-04-24 MED ORDER — ALBUTEROL SULFATE (2.5 MG/3ML) 0.083% IN NEBU: 2.5000 mg | INHALATION_SOLUTION | RESPIRATORY_TRACT | 12 refills | Status: DC | PRN

## 2017-04-24 MED ORDER — DOCUSATE SODIUM 50 MG/5ML PO LIQD: 100.0000 mg | Freq: Two times a day (BID) | ORAL | 0 refills | Status: DC | PRN

## 2017-04-24 MED ORDER — ATORVASTATIN CALCIUM 20 MG PO TABS
20.0000 mg | ORAL_TABLET | Freq: Every day | ORAL | Status: DC
  Administered 2017-04-25 – 2017-05-16 (×22): 20 mg via ORAL
  Filled 2017-04-24 (×22): qty 1

## 2017-04-24 MED ORDER — ONDANSETRON HCL 4 MG PO TABS: 4.0000 mg | ORAL_TABLET | Freq: Four times a day (QID) | ORAL | Status: DC | PRN

## 2017-04-24 MED ORDER — ONDANSETRON HCL 4 MG PO TABS: 4.0000 mg | ORAL_TABLET | ORAL | Status: DC | PRN

## 2017-04-24 MED ORDER — ENSURE ENLIVE PO LIQD
237.0000 mL | Freq: Two times a day (BID) | ORAL | Status: DC
  Administered 2017-04-25 – 2017-05-16 (×27): 237 mL via ORAL

## 2017-04-24 MED ORDER — LEVOTHYROXINE SODIUM 50 MCG PO TABS
50.0000 ug | ORAL_TABLET | Freq: Every day | ORAL | Status: DC
Start: 2017-04-25 — End: 2017-05-16
  Administered 2017-04-25 – 2017-05-16 (×22): 50 ug via ORAL
  Filled 2017-04-24 (×22): qty 1

## 2017-04-24 MED ORDER — ALBUTEROL SULFATE (2.5 MG/3ML) 0.083% IN NEBU: 2.5000 mg | INHALATION_SOLUTION | RESPIRATORY_TRACT | Status: DC | PRN

## 2017-04-24 MED ORDER — ONDANSETRON HCL 4 MG/2ML IJ SOLN: 4.0000 mg | Freq: Four times a day (QID) | INTRAMUSCULAR | Status: DC | PRN

## 2017-04-24 MED ORDER — LACOSAMIDE 200 MG PO TABS: 200.0000 mg | ORAL_TABLET | Freq: Two times a day (BID) | ORAL | 3 refills | Status: DC

## 2017-04-24 MED ORDER — ACETAMINOPHEN 650 MG RE SUPP: 650.0000 mg | RECTAL | Status: DC | PRN

## 2017-04-24 NOTE — H&P (Signed)
Physical Medicine and Rehabilitation Admission H&P        Chief Complaint  Patient presents with  . Subdural Hematoma    Transfer- [place] [facility]  : HPI: Mr. Juanita Devincent is a 47 year old right-handed male with history of glioblastoma status post surgical removal tumor/VP shunt in 1993 by Dr. 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 Krista Blue. Presented 04/09/2017 upon transfer from [facility] with altered mental status times 2 days as well as reports of recent falls. Per chart review uses a walker was able to speak and interact with family. There was initial concern for possible sepsis with noted low-grade fever elevated WBC and treated with Rocephin while at [facility]. Cranial CT scan from outside hospital showed large subdural hematoma on the right. Required intubation with difficult airway noted inspiratory stridor requiring intubation by ENT prior to going to the OR where he underwent craniotomy hematoma evacuation 04/10/2017 by Dr. [name]. Hospital course noted seizures with EEG showing generalized cerebral dysfunction follow-up per neurology services maintained on Keppra as well as adjustments in the addition of Vimpat. Patient slowly weaned from ventilator. Later underwent transnasal fiberoptic laryngoscopy 04/18/2017 per Dr. [name] after initial stridor and follow-up after extubation. Bouts of leukocytosis felt to be reactive due to steroids. MRSA PCR screen positive with contact precautions. Dysphagia #1 thin liquid diet. Physical and occupational therapy ongoing patient was admitted for a comprehensive rehab program    Review of Systems  Unable to perform ROS: Acuity of condition       Past Medical History:  Diagnosis Date  . Brain cancer ([place])   . Hypercholesteremia   . Seizure ([place])   . Stroke ([place])   . Thyroid disorder         Past  Surgical History:  Procedure Laterality Date  . APPENDECTOMY     1980s  . BRAIN SURGERY     1993  . CRANIOTOMY Right 04/09/2017   Procedure: CRANIOTOMY HEMATOMA EVACUATION SUBDURAL;  Surgeon: Erline Levine, MD;  Location: [place];  Service: Neurosurgery;  Laterality: Right;  . DIRECT LARYNGOSCOPY N/A 04/09/2017   Procedure: DIRECT LARYNGOSCOPY;  Surgeon: Erline Levine, MD;  Location: [place];  Service: Neurosurgery;  Laterality: N/A;  . DIRECT LARYNGOSCOPY N/A 04/09/2017   Procedure: DIRECT LARYNGOSCOPY;  Surgeon: [name], MD;  Location: [place];  Service: ENT;  Laterality: N/A;  . DIRECT LARYNGOSCOPY N/A 04/18/2017   Procedure: DIRECT LARYNGOSCOPY;  Surgeon: [name], MD;  Location: [place];  Service: ENT;  Laterality: N/A;  . TRACHEOSTOMY TUBE PLACEMENT N/A 04/18/2017   Procedure: TRANSNASAL FIBER OPTIC LARYNGOSCOPY;  Surgeon: [name], MD;  Location: [place];  Service: ENT;  Laterality: N/A;   Family History  Adopted: Yes   Social History:  reports that he has never smoked. He has never used smokeless tobacco. He reports that he does not drink alcohol or use drugs. Allergies:       Allergies  Allergen Reactions  . Heparin Anaphylaxis    Patient has HITT  . Lidocaine Hives  . Depakote [Divalproex Sodium] Rash  . Phenytoin Sodium Extended Rash         Medications Prior to Admission  Medication Sig Dispense Refill  . atorvastatin (LIPITOR) 20 MG tablet Take 20 mg by mouth daily.  2  . divalproex (DEPAKOTE) 250 MG DR tablet Take 250-500 mg by mouth 2 (two) times daily. 250mg  in the morning and 500mg  at  bedtime  0  . FLUoxetine (PROZAC) 20 MG capsule Take by mouth daily.  2  . fondaparinux (ARIXTRA) 2.5 MG/0.5ML SOLN injection INJECT 2.5 MG SUBCUTANEOUSLY DAILY  2  . levETIRAcetam (KEPPRA) 750 MG tablet Take 1 tablet (750 mg total) by mouth every 12 (twelve) hours. 60 tablet 0  . levothyroxine (SYNTHROID, LEVOTHROID) 75 MCG tablet Take 75 mcg by mouth daily.   2  . loratadine (CLARITIN) 10 MG tablet Take 10 mg by mouth daily.  1    Drug Regimen Review  Drug regimen was reviewed and remains appropriate with no significant issues identified  Home: Home Living Family/patient expects to be discharged to:: Private residence Living Arrangements: Parent(Lives with his parents since 71; attached apartment to hi) Available Help at Discharge: Family, Available 24 hours/day(Mom, Pamala Hurry, is 59 and capable; Dad [place] legaaly blind, niece) Type of Home: Apartment(apartment built attatched to Rhonda's home) Home Access: Ramped entrance Home Layout: One level Bathroom Shower/Tub: Tub/shower unit, Architectural technologist: Standard Bathroom Accessibility: Yes Additional Comments: Mom clarified PLOF and home set up on 04/24/17  Lives With: (parents)   Functional History: Prior Function Level of Independence: Needs assistance Gait / Transfers Assistance Needed: Mom reports Mod I with RW; feeds self, bathes self with her supervision Comments: clarified with Mom  Functional Status:  Mobility: Bed Mobility Overal bed mobility: Needs Assistance Bed Mobility: Supine to Sit Rolling: Max assist, +2 for physical assistance Sidelying to sit: Max assist Supine to sit: Mod assist, +2 for safety/equipment Sit to supine: Min assist General bed mobility comments: Pt requires assist to initiate all aspects of activiites.  He initially was rigid and somewhat resistive to movement.  He requires increased time to process commands and initate activity with cues and assist to initiate legs off of bed and elevating trunk with HOB 30 degrees then pt able to take over and scoot to EOB Transfers Overall transfer level: Needs assistance Equipment used: Rolling walker (2 wheeled), 2 person hand held assist Transfers: Sit to/from Stand Sit to Stand: Min assist, +2 physical assistance, +2 safety/equipment Stand pivot transfers: Min assist, +2 physical assistance, +2  safety/equipment General transfer comment: Pt required min A to move into standing, and min A to steady, increased time to process and initiate Ambulation/Gait Ambulation/Gait assistance: Min assist, +2 physical assistance, +2 safety/equipment, Mod assist Ambulation Distance (Feet): 150 Feet Assistive device: Rolling walker (2 wheeled) Gait Pattern/deviations: Step-through pattern, Decreased stride length, Trunk flexed General Gait Details: pt able to walk with RW with assist to initiate mod +2 with progression to min +2 with decreased speed, veering left with assist to reposition RW and trunk. walked without Rw grossly 10' with pt with increased hesitation  Gait velocity interpretation: Below normal speed for age/gender  ADL: ADL Overall ADL's : Needs assistance/impaired Eating/Feeding: NPO Grooming: Oral care, Maximal assistance, Standing Grooming Details (indicate cue type and reason): Requires hand over hand assist.  He demonstrated difficulty orienting toothbrugh appropriately.  After signfiicant repetition, pt took over task and performed with min A in standing  Upper Body Bathing: Maximal assistance, Sitting Lower Body Bathing: Maximal assistance, Bed level Upper Body Dressing : Maximal assistance, Sitting Lower Body Dressing: Maximal assistance, Bed level Lower Body Dressing Details (indicate cue type and reason): Donned socks Toilet Transfer: Minimal assistance, Ambulation, Comfort height toilet, BSC, RW, Grab bars Toileting- Clothing Manipulation and Hygiene: Minimal assistance, Sit to/from stand Functional mobility during ADLs: Minimal assistance, +2 for physical assistance, +2 for safety/equipment, Rolling walker General ADL  Comments: Did not engage in ADL tasks despite max cueing   Cognition: Cognition Overall Cognitive Status: Impaired/Different from baseline Arousal/Alertness: Awake/alert Orientation Level: Other (comment)(unable to assess no speech) Attention:  Focused Focused Attention: Impaired Focused Attention Impairment: Functional basic, Verbal basic Cognition Arousal/Alertness: Awake/alert Behavior During Therapy: Flat affect Overall Cognitive Status: Impaired/Different from baseline Area of Impairment: Orientation, Attention, Memory, Following commands, Problem solving Orientation Level: Disoriented to, Place, Time, Situation Current Attention Level: Sustained Memory: Decreased short-term memory Following Commands: Follows one step commands with increased time, Follows one step commands inconsistently Safety/Judgement: Decreased awareness of deficits, Decreased awareness of safety Problem Solving: Slow processing, Decreased initiation, Difficulty sequencing, Requires verbal cues, Requires tactile cues General Comments: Pt is HOH and responds well with information presented on the Rt.  He follows one step commands ~60% of the time, with a delay.   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  Requires max A for sequencing during grooming tasks  Difficult to assess due to: Hard of hearing/deaf  Physical Exam: Blood pressure 109/77, pulse (!) 104, temperature 98.1 F (36.7 C), resp. rate 19, height 5\' 10"  (1.778 m), weight 86.1 kg (189 lb 13.1 oz), SpO2 99 %. Physical Exam  Vitals reviewed. HENT:  Multiple nodules on head from prior surgeries, shuts, etc. Scalp wounds clean  Eyes: Right eye exhibits no discharge. Left eye exhibits no discharge.  Pupils round and reactive to light  Neck: Normal range of motion. Neck supple. No tracheal deviation present. No thyromegaly present.  Cardiovascular: Normal rate, regular rhythm and normal heart sounds. Exam reveals no friction rub.  No murmur heard. Respiratory: Effort normal and breath sounds normal. No respiratory distress.  GI: Soft. Bowel sounds are normal. He exhibits no distension.  Skin.  Warm and dry.  Craniotomy sites clean and dry Neurological.  Patient  alert.  Sitting up in chair eating lunch with the assistance of his mother.  He did make eye contact with examiner. Seemed to attempt to voice responses to my basic questions.  Inconsistent to follow one-step simple commands---struggled to initiate basic tasks and perseverated on others.  He moves right side preferrably over left, but inconsistent effort with basic MMT.  Psych: flat     LabResultsLast48Hours       Results for orders placed or performed during the hospital encounter of 04/09/17 (from the past 48 hour(s))  Glucose, capillary     Status: None   Collection Time: 04/22/17  3:19 PM  Result Value Ref Range   Glucose-Capillary 92 65 - 99 mg/dL  Glucose, capillary     Status: Abnormal   Collection Time: 04/22/17  8:28 PM  Result Value Ref Range   Glucose-Capillary 104 (H) 65 - 99 mg/dL  Glucose, capillary     Status: Abnormal   Collection Time: 04/22/17 11:46 PM  Result Value Ref Range   Glucose-Capillary 105 (H) 65 - 99 mg/dL  Glucose, capillary     Status: Abnormal   Collection Time: 04/23/17  4:16 AM  Result Value Ref Range   Glucose-Capillary 104 (H) 65 - 99 mg/dL  Glucose, capillary     Status: None   Collection Time: 04/23/17  8:19 AM  Result Value Ref Range   Glucose-Capillary 97 65 - 99 mg/dL  Glucose, capillary     Status: Abnormal   Collection Time: 04/23/17  5:09 PM  Result Value Ref Range   Glucose-Capillary 111 (H) 65 - 99 mg/dL   Comment 1 Notify RN  Comment 2 Document in Chart   Glucose, capillary     Status: Abnormal   Collection Time: 04/23/17  7:46 PM  Result Value Ref Range   Glucose-Capillary 130 (H) 65 - 99 mg/dL  Glucose, capillary     Status: None   Collection Time: 04/23/17 11:13 PM  Result Value Ref Range   Glucose-Capillary 86 65 - 99 mg/dL  Glucose, capillary     Status: None   Collection Time: 04/24/17  3:14 AM  Result Value Ref Range   Glucose-Capillary 80 65 - 99 mg/dL  Glucose, capillary     Status:  None   Collection Time: 04/24/17  7:12 AM  Result Value Ref Range   Glucose-Capillary 92 65 - 99 mg/dL  Glucose, capillary     Status: Abnormal   Collection Time: 04/24/17 11:41 AM  Result Value Ref Range   Glucose-Capillary 103 (H) 65 - 99 mg/dL     ImagingResults(Last48hours)  No results found.       Medical Problem List and Plan: 1.Decreased functional mobilitysecondary to right subdural hematoma after fall complicated by acute hypoxic respiratory failure and stridor as well as history of glioblastoma and seizure disorder             -admit to inpatient rehab 2. DVT Prophylaxis/Anticoagulation/history of heparin-induced thrombocytopenia: SCDs. Chronic Arixtra currently on hold due to hematoma 3. Pain Management:Tylenol as needed 4. Mood:Prozac 20 mg daily 5. Neuropsych: This patientiscapable of making decisions on hisown behalf. 6. Skin/Wound Care:Routine skin checks 7. Fluids/Electrolytes/Nutrition:Routine INO's with follow-up chemistries upon admission 8.Seizure disorder. Vimpat 200 mg twice daily, Keppra 1500 mg twice daily. Follow-up neurology services Dr. Krista Blue as outpatient 9.Dysphagia. Dysphagia #1 thin liquids. Follow-up speech therapy             -full supervision with meals 10.Hypothyroidism. Synthroid 11.Hyperlipidemia. Lipitor 12.Leukocytosis. Felt to be reactive from recent Decadron 13.  MRSA PCR screening positive.  Maintain contact precautions 14.  Constipation.  Laxative assistance       Post Admission Physician Evaluation: 1. Functional deficits secondary  to Right SDH after fall. 2. Patient is admitted to receive collaborative, interdisciplinary care between the physiatrist, rehab nursing staff, and therapy team. 3. Patient's level of medical complexity and substantial therapy needs in context of that medical necessity cannot be provided at a lesser intensity of care such as a SNF. 4. Patient has  experienced substantial functional loss from his/her baseline which was documented above under the "Functional History" and "Functional Status" headings.  Judging by the patient's diagnosis, physical exam, and functional history, the patient has potential for functional progress which will result in measurable gains while on inpatient rehab.  These gains will be of substantial and practical use upon discharge  in facilitating mobility and self-care at the household level. 5. Physiatrist will provide 24 hour management of medical needs as well as oversight of the therapy plan/treatment and provide guidance as appropriate regarding the interaction of the two. 6. The Preadmission Screening has been reviewed and patient status is unchanged unless otherwise stated above. 7. 24 hour rehab nursing will assist with bladder management, bowel management, safety, skin/wound care, disease management, medication administration, pain management and patient education  and help integrate therapy concepts, techniques,education, etc. 8. PT will assess and treat for/with: Lower extremity strength, range of motion, stamina, balance, functional mobility, safety, adaptive techniques and equipment, NMR, family education.   Goals are: min assist to supervision. 9. OT will assess and treat for/with: ADL's, functional mobility, safety, upper  extremity strength, adaptive techniques and equipment, NMR, family education.   Goals are: min to mod assist. Therapy may potentially proceed with showering this patient. 10. SLP will assess and treat for/with: cognition, speech, language, swallowing.  Goals are: min to mod assist. 11. Case Management and Social Worker will assess and treat for psychological issues and discharge planning. 12. Team conference will be held weekly to assess progress toward goals and to determine barriers to discharge. 13. Patient will receive at least 3 hours of therapy per day at least 5 days per week. 14. ELOS:  20-30 days       15. Prognosis:  excellent     I have personally performed a face to face diagnostic evaluation on this patient. Additionally, I have reviewed the physician assistant's documentation and concur with PA's documentation above.   Meredith Staggers, MD, Mellody Drown  04/24/2017  Lavon Paganini [place], PA-C 04/24/2017

## 2017-04-24 NOTE — PMR Pre-admission (Addendum)
PMR Admission Coordinator Pre-Admission Assessment  Patient: Glen [name] is an 47 y.o., male MRN: [id number] DOB: [birth year] Height: 5\' 10"  (177.8 cm) Weight: 86.1 kg (189 lb 13.1 oz)              Insurance Information HMO:     PPO:      PCP:      IPA:      80/20: yes     OTHER: no HMO PRIMARY: Medicare a and b      Policy#: [id number]      Subscriber: pt Benefits:  Phone #: passport one online     Name: 04/24/2017 Eff. Date: 11/17/1993     Deduct: $1364      Out of Pocket Max: none      Life Max: none CIR: 100%      SNF: 20 full days Outpatient: 80%     Co-Pay: 20% Home Health: 100%      Co-Pay: none DME: 80%     Co-Pay: 20% Providers: pt choice  SECONDARY: Medicaid  Of [place]  Policy#: [id number]     Subscriber: pt  Medicaid Application Date:       Case Manager:  Disability Application Date:       Case Worker:   Patient is  disabled  Emergency Facilities manager Information    Name Relation Home Work Mobile   [place] Mother   [phone]   Terrilee Files [phone]       Current Medical History  Patient Admitting Diagnosis: right SDH after fall  History of Present Illness:  HPI: Glen [name] is a 47 y.o. 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 Krista Blue.  Presented 04/09/2017 upon transfer from [facility] with altered mental status times 2 days as well as reports of recent falls.   There was initial concern for some possible sepsis with noted low-grade fever and elevated WBC and treated with Rocephin while at [facility].  Cranial CT scan from outside hospital showed large subdural hematoma on the right.  Required intubation with difficult airway noted inspiratory stridor was intubated by ENT prior to going to the OR.  Underwent craniotomy hematoma evacuation  04/10/2017 per Dr. [name].  Hospital course noted seizures EEG with generalized cerebral dysfunction follow-up per neurology services and Keppra was adjusted as well as the addition of Vimpat.  Patient slowly weaned from ventilator.  Later underwent transnasal fiberoptic laryngoscopy 04/18/2017 per Dr. [name] after initial stridor and follow-up after extubation.  MRSA PCR screening positive for contact precautions.  Currently maintained on a dysphagia 1 thin liquid diet.    Past Medical History  Past Medical History:  Diagnosis Date  . Brain cancer ([place])   . Hypercholesteremia   . Seizure ([place])   . Stroke ([place])   . Thyroid disorder     Family History  family history is not on file. He was adopted.  Prior Rehab/Hospitalizations:  Has the patient had major surgery during 100 days prior to admission? No   CIR 2004 with Dr. [name]. [facility]. Then returned home with his parents  Current Medications   Current Facility-Administered Medications:  .  acetaminophen (TYLENOL) tablet 650 mg, 650 mg, Oral, Q4H PRN, 650 mg at 04/11/17 1548 **OR** acetaminophen (TYLENOL) suppository 650 mg, 650 mg, Rectal, Q4H PRN, [name], MD,  650 mg at 04/10/17 0755 .  albuterol (PROVENTIL) (2.5 MG/3ML) 0.083% nebulizer solution 2.5 mg, 2.5 mg, Nebulization, Q4H PRN, [name], MD .  atorvastatin (LIPITOR) tablet 20 mg, 20 mg, Oral, Daily, [name], MD, 20 mg at 04/24/17 1129 .  bisacodyl (DULCOLAX) suppository 10 mg, 10 mg, Rectal, Daily PRN, [name], MD .  docusate (COLACE) 50 MG/5ML liquid 100 mg, 100 mg, Per Tube, BID PRN, [name], MD, 100 mg at 04/13/17 0904 .  feeding supplement (ENSURE ENLIVE) (ENSURE ENLIVE) liquid 237 mL, 237 mL, Oral, BID BM, Erline Levine, MD, 237 mL at 04/23/17 1055 .  FLUoxetine (PROZAC) capsule 20 mg, 20 mg, Oral, Daily, [name], MD, 20 mg at 04/24/17 1126 .  HYDROmorphone (DILAUDID) injection  0.25-0.5 mg, 0.25-0.5 mg, Intravenous, Q5 min PRN, Myrtie Soman, MD .  labetalol (NORMODYNE,TRANDATE) injection 10-40 mg, 10-40 mg, Intravenous, Q10 min PRN, [name], MD .  lacosamide (VIMPAT) tablet 200 mg, 200 mg, Oral, BID, Erline Levine, MD, 200 mg at 04/24/17 1126 .  levETIRAcetam (KEPPRA) 100 MG/ML solution 1,500 mg, 1,500 mg, Oral, BID, Erline Levine, MD, 1,500 mg at 04/24/17 1251 .  levothyroxine (SYNTHROID, LEVOTHROID) tablet 50 mcg, 50 mcg, Oral, QAC breakfast, [name], MD, 50 mcg at 04/24/17 1129 .  ondansetron (ZOFRAN) tablet 4 mg, 4 mg, Oral, Q4H PRN **OR** ondansetron (ZOFRAN) injection 4 mg, 4 mg, Intravenous, Q4H PRN, [name], MD .  polyethylene glycol (MIRALAX / GLYCOLAX) packet 17 g, 17 g, Oral, Daily, [name], MD, 17 g at 04/24/17 1125 .  promethazine (PHENERGAN) injection 6.25-12.5 mg, 6.25-12.5 mg, Intravenous, Q15 min PRN, Myrtie Soman, MD .  senna-docusate (Senokot-S) tablet 1 tablet, 1 tablet, Oral, QHS PRN, [name], MD  Patients Current Diet: DIET - DYS 1 Room service appropriate? Yes; Fluid consistency: Thin Fall precautions Seizure precautions Aspiration precautions  Precautions / Restrictions Precautions Precautions: Fall Precaution Comments: ETT 23cm Restrictions Weight Bearing Restrictions: No   Has the patient had 2 or more falls or a fall with injury in the past year?No  Prior Activity Level Limited Community (1-2x/wk): Mod I with RW; does not drive  Development worker, international aid / Equipment Home Assistive Devices/Equipment: Built-in shower seat, Dentures (specify type), Eyeglasses, Shower chair with back, Walker (specify type), Wheelchair  Prior Device Use: Indicate devices/aids used by the patient prior to current illness, exacerbation or injury? Walker  Prior Functional Level Prior Function Level of Independence: Needs assistance Gait / Transfers Assistance Needed: Mom reports Mod I with RW; feeds self, bathes self with her  supervision Comments: clarified with Mom  Self Care: Did the patient need help bathing, dressing, using the toilet or eating?  Needed some help  Indoor Mobility: Did the patient need assistance with walking from room to room (with or without device)? Independent  Stairs: Did the patient need assistance with internal or external stairs (with or without device)? Needed some help  Functional Cognition: Did the patient need help planning regular tasks such as shopping or remembering to take medications? Needed some help  Current Functional Level Cognition  Arousal/Alertness: Awake/alert Overall Cognitive Status: Impaired/Different from baseline Difficult to assess due to: Hard of hearing/deaf Current Attention Level: Sustained Orientation Level: Other (comment)(unable to assess no speech) Following Commands: Follows one step commands with increased time, Follows one step commands inconsistently Safety/Judgement: Decreased awareness of deficits, Decreased awareness of safety General Comments: Pt is HOH and responds well with information presented on the Rt.  He follows one step commands ~60%  of the time, with a delay.   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  Requires max A for sequencing during grooming tasks  Attention: Focused Focused Attention: Impaired Focused Attention Impairment: Functional basic, Verbal basic    Extremity Assessment (includes Sensation/Coordination)  Upper Extremity Assessment: Difficult to assess due to impaired cognition, RUE deficits/detail, LUE deficits/detail RUE Deficits / Details: WFL strength. Poor initation of movement and difficulty with coorindation. Pt with rapid movements and difficulty with motor planning and control. LUE Deficits / Details: WFL strength. Poor initation of movement and difficulty with coorindation. Pt with rapid movements and difficulty with motor planning and control.  Lower Extremity Assessment: Defer  to PT evaluation RLE Deficits / Details: moves LE spontaneously against gravity in bed.  bears full weight in standing.  movement is uncoordinated RLE Coordination: decreased fine motor LLE Deficits / Details: see R LE, L LE appears more uncoordinated and lower tone than R LE LLE Coordination: decreased fine motor, decreased gross motor    ADLs  Overall ADL's : Needs assistance/impaired Eating/Feeding: NPO Grooming: Oral care, Maximal assistance, Standing Grooming Details (indicate cue type and reason): Requires hand over hand assist.  He demonstrated difficulty orienting toothbrugh appropriately.  After signfiicant repetition, pt took over task and performed with min A in standing  Upper Body Bathing: Maximal assistance, Sitting Lower Body Bathing: Maximal assistance, Bed level Upper Body Dressing : Maximal assistance, Sitting Lower Body Dressing: Maximal assistance, Bed level Lower Body Dressing Details (indicate cue type and reason): Donned socks Toilet Transfer: Minimal assistance, Ambulation, Comfort height toilet, BSC, RW, Grab bars Toileting- Clothing Manipulation and Hygiene: Minimal assistance, Sit to/from stand Functional mobility during ADLs: Minimal assistance, +2 for physical assistance, +2 for safety/equipment, Rolling walker General ADL Comments: Did not engage in ADL tasks despite max cueing     Mobility  Overal bed mobility: Needs Assistance Bed Mobility: Supine to Sit Rolling: Max assist, +2 for physical assistance Sidelying to sit: Max assist Supine to sit: Mod assist, +2 for safety/equipment Sit to supine: Min assist General bed mobility comments: Pt requires assist to initiate all aspects of activiites.  He initially was rigid and somewhat resistive to movement.  He requires increased time to process commands and initate activity with cues and assist to initiate legs off of bed and elevating trunk with HOB 30 degrees then pt able to take over and scoot to EOB     Transfers  Overall transfer level: Needs assistance Equipment used: Rolling walker (2 wheeled), 2 person hand held assist Transfers: Sit to/from Stand Sit to Stand: Min assist, +2 physical assistance, +2 safety/equipment Stand pivot transfers: Min assist, +2 physical assistance, +2 safety/equipment General transfer comment: Pt required min A to move into standing, and min A to steady, increased time to process and initiate    Ambulation / Gait / Stairs / Wheelchair Mobility  Ambulation/Gait Ambulation/Gait assistance: Min assist, +2 physical assistance, +2 safety/equipment, Mod assist Ambulation Distance (Feet): 150 Feet Assistive device: Rolling walker (2 wheeled) Gait Pattern/deviations: Step-through pattern, Decreased stride length, Trunk flexed General Gait Details: pt able to walk with RW with assist to initiate mod +2 with progression to min +2 with decreased speed, veering left with assist to reposition RW and trunk. walked without Rw grossly 10' with pt with increased hesitation  Gait velocity interpretation: Below normal speed for age/gender    Posture / Balance Dynamic Sitting Balance Sitting balance - Comments: Pt requires min A initially for static sitting, progressed to  close min guard assist  Balance Overall balance assessment: Needs assistance Sitting-balance support: Single extremity supported, Bilateral upper extremity supported, No upper extremity supported, Feet supported Sitting balance-Leahy Scale: Poor Sitting balance - Comments: Pt requires min A initially for static sitting, progressed to close min guard assist  Postural control: Right lateral lean, Posterior lean Standing balance support: Single extremity supported, During functional activity Standing balance-Leahy Scale: Poor Standing balance comment: Requires min A for static standing     Special needs/care consideration BiPAP/CPAP  N/a CPM  N/a Continuous Drip IV  N/a Dialysis  N/a Life Vest  N/a Oxygen   N/a Special Bed  N/a Trach Size n/a Wound Vac n/a Skin surgical incision. Small red area to left of forehead, dry scaly skin, abrasion right arm, rash face and neck noted Bowel mgmt: incontinent LBM 04/21/17 Bladder mgmt:external catheter Diabetic mgmt n/a Mom manages his meds; HOH since 2015   Previous Home Environment Living Arrangements: Parent(Lives with his parents since 1994; attached apartment to hi)  Lives With: (parents) Available Help at Discharge: Family, Available 24 hours/day(Mom, Pamala Hurry, is 16 and capable; Dad [place] legaaly blind, niece) Type of Home: Apartment(apartment built attatched to Rhonda's home) Home Layout: One level Home Access: Ramped entrance Bathroom Shower/Tub: Tub/shower unit, Architectural technologist: Standard Bathroom Accessibility: Yes How Accessible: Accessible via walker Lykens: No Additional Comments: Mom clarified PLOF and home set up on 04/24/17  Lives with parents since 1994. Attached apartment/suite to his sister's home, [name]. He is a retired Mining engineer. Now disabled. Divorced with a 79 year old son that he has not seen in 2 years. [name] works, her 70 year old daughter, Marye Round , is in the home and currently unemployed. Family very supportive of [name].  Discharge Living Setting Plans for Discharge Living Setting: Lives with (comment), Apartment(parents in attatched inlaw apartment) Type of Home at Discharge: Apartment Discharge Home Layout: One level Discharge Home Access: [place] entrance Discharge Bathroom Shower/Tub: Tub/shower unit, Curtain Discharge Bathroom Toilet: Standard Discharge Bathroom Accessibility: Yes How Accessible: Accessible via walker Does the patient have any problems obtaining your medications?: No  Social/Family/Support Systems Patient Roles: Parent(has 78 year old son he has not seem in 2 years) Contact Information: Pamala Hurry, his Mom and sister, [name] Anticipated Caregiver: Mom, sisters, brother  and niece, [place] Anticipated Ambulance person Information: see above Ability/Limitations of Caregiver: Mom is 58 years old and can do superivsion to min assist Caregiver Availability: 24/7 Discharge Plan Discussed with Primary Caregiver: Yes Is Caregiver In Agreement with Plan?: Yes Does Caregiver/Family have Issues with Lodging/Transportation while Pt is in Rehab?: No  Goals/Additional Needs Patient/Family Goal for Rehab: min assist with PT, OT, and SLP Expected length of stay: ELOS 20 plus days per MD Additional Information: 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 Issues have been addressed and update has been discussed with Dr. [name] and patient now appropriate for inpatient rehabilitation. Will admit to inpatient rehab today.   Preadmission Screen Completed By:  Cleatrice Burke, 04/24/2017 2:27  PM ______________________________________________________________________   Discussed status with Dr. [name] on 04/24/2017 at  1429 and received telephone approval for admission today.  Admission Coordinator:  Cleatrice Burke, time 8756 Date 04/24/2017

## 2017-04-24 NOTE — Progress Notes (Signed)
Patient ID: Glen [name], male   DOB: [birth year], 47 y.o.   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  He was found to have large subdural hematoma and was transferred to [facility] under neurosurgery service.  He underwent craniotomy with hematoma evacuation.  ENT was consulted for difficult airway.  He initially failed extubation on 04/13/2017.  He had seizure-like activities overnight of 04/16/2017 and EEG performed had demonstrated diffuse nonspecific cerebral dysfunction.  Neurology was consulted.  MRI on 04/18/2017 demonstrated bilateral SDH which were stable.  Patient was extubated in OR on 04/18/2017 by ENT. PCCM has signed off.  TRH was requested to follow-up on the patient from 04/20/2017.  Patient was under neurosurgery service.   Assessment & Plan:   Active Problems:   Subdural hematoma (HCC)   Abnormal movements  Subdural hematoma status post evacuation -Management as per primary neurosurgery team.  Noted that patient was discharged by primary service to inpatient rehab.  I saw patient while he was still admitted in the hospital on 4N  Acute hypoxic respiratory failure with stridor -Patient was extubated on 04/18/2017. PCCM has signed off. -Incentive spirometry -Hypoxia resolved.  Recurrent partial seizures in a patient with history of CNS tumor followed by a medulloblastoma with VP shunt/status  epilepticus -Neurology was consulted and assisted with AED management. -Seizure precautions.  Aspiration precautions.  Fall precautions - Was on IV Vimpat at higher dose than he was on prior to his status epilepticus and Keppra.  Depakote which had been started to abort his status epilepticus on 4/4 had to be discontinued on 4/5 due to skin rash likely related to it.   -Discussed with Dr. Leonel Ramsay, Neurology and please refer to his sign off note 4/7 for details.  Skin rash almost resolved.  In summary he recommends continuing Keppra 1.5 g twice daily + Vimpat 200 mg twice daily.  If rash worsens or fails to continue improving, could consider changing Vimpat to Lyrica (would start at 100 mg twice daily).  If rash is improving, but has further breakthrough seizures, then recommends adding Lyrica as a third agent.  He states that patient was on Depakote PTA but sometimes large doses may cause side effects that slower introduction does not.  Since patient developed rash with 2 different AEDs at this point (phenytoin and Depakote) he recommends avoiding an agent less likely to cause a rash.  Hence instead of Trileptal previously recommended, consider Lyrica if needed. -As per discussion with RN, no further seizures since 04/20/17.  AEDs were changed to oral 4/7. -Recommend outpatient Neurology follow-up.  Also no driving if he was.  Leukocytosis -Probably reactive from Decadron use.  Off Decadron -Improving.  Hypothyroidism -Continue Synthroid  Generalized deconditioning -PT/OT/SLP eval.  Patient has been discharged to inpatient rehab for further evaluation and management.  Skin rash -Suspected due to Depakote allergy.  Depakote discontinued by neurology.  May need to add Depakote to list of allergies but not fully certain regarding allergy.  Skin rash appears to be resolving.  Subjective: Unable to obtain  history from patient.  Sitting up in bed and tracks activity with his eyes.  Unintelligible  speech.  As per RN, no seizures since 4/4 and no acute issues noted.  Objective: Vitals:   04/24/17 0800 04/24/17 1128 04/24/17 1600 04/24/17 1637  BP: 99/67 109/77 104/76   Pulse: 74 (!) 104 97   Resp: (!) 22 19 (!) 22   Temp:    97.6 F (36.4 C)  TempSrc:      SpO2: 94% 99% 95%   Weight:      Height:        Intake/Output Summary (Last 24 hours) at 04/24/2017 1839 Last data filed at 04/24/2017 1300 Gross per 24 hour  Intake 480 ml  Output 1400 ml  Net -920 ml   Filed Weights   04/22/17 0500 04/23/17 0500 04/24/17 0631  Weight: 84.1 kg (185 lb 6.5 oz) 83.4 kg (183 lb 13.8 oz) 86.1 kg (189 lb 13.1 oz)    Examination:  General exam: Young male, looks older than stated age, sitting up comfortably in bed. Respiratory system: Clear to auscultation.  No increased work of breathing. Cardiovascular system: S1 & S2 heard, rate controlled.  RRR.  No JVD, murmurs or pedal edema.  Telemetry personally reviewed: Sinus rhythm. Gastrointestinal system: Abdomen is nondistended, soft and nontender. Normal bowel sounds heard.  Stable. Extremities: No cyanosis, clubbing, edema. Moves all limbs symmetrically. CNS: Alert and tracks activity with eyes but does not follow instructions today.  Unable to understand speech.  Skin: Erythematous maculopapular rash left side of face, neck and left parietal scalp and anterior chest almost resolved.  Right parietal scalp surgical staples intact.      Data Reviewed: I have personally reviewed following labs and imaging studies  CBC: Recent Labs  Lab 04/18/17 0337 04/19/17 0344 04/21/17 0501  WBC 17.1* 20.0* 15.1*  NEUTROABS  --   --  11.2*  HGB 12.5* 14.2 13.1  HCT 37.9* 41.3 40.5  MCV 89.2 87.9 89.4  PLT 391 391 443   Basic Metabolic Panel: Recent Labs  Lab 04/18/17 0337 04/19/17 0344 04/21/17 0501  NA 135 133* 135  K 4.5 4.6 4.3  CL 99* 98* 99*  CO2 26 24 28   GLUCOSE 131* 112* 91  BUN 22* 23* 16  CREATININE 0.85 0.84 0.98  CALCIUM  8.8* 8.6* 8.0*  MG 2.3 2.4 2.3  PHOS 3.5 4.4  --    GFR: Estimated Creatinine Clearance: 96.2 mL/min (by C-G formula based on SCr of 0.98 mg/dL). CBG: Recent Labs  Lab 04/23/17 1946 04/23/17 2313 04/24/17 0314 04/24/17 0712 04/24/17 1141  GLUCAP 130* 86 80 92 103*   Sepsis Labs: Recent Labs  Lab 04/20/17 2358  LATICACIDVEN 0.8    No results found for this or any previous visit (from the past 240 hour(s)).       Radiology Studies: No results found.      Scheduled Meds:  Continuous Infusions:    LOS: 15 days    Vernell Leep, MD, FACP, [facility]. [facility] Pager [phone]  If 7PM-7AM, please contact night-coverage [email or web address] Password [facility] 04/24/2017, 6:39 PM

## 2017-04-24 NOTE — Progress Notes (Signed)
  Speech Language Pathology Treatment: Dysphagia;Cognitive-Linquistic  Patient Details Name: Glen [name] MRN: [id number] DOB: [birth year] Today's Date: 04/24/2017 Time: 4709-6283 SLP Time Calculation (min) (ACUTE ONLY): 24 min  Assessment / Plan / Recommendation Clinical Impression  Treatment focused on both dysphagia and cognitive linguistic goals. Patient sleeping upon arrival, repositioned by SLP to maximize safety with po intake and alert state for ability to participate in functional self feeding activity. Patient able to follow basic 1-step commands today only in the context of a familiar task and with max cueing today with less than 10% accuracy. Otherwise, max tactile (HOH) assistance required for carryout of basic 1-step directions as well as for sustained attention to basic task. Able to consume pureed solids without overt indication of aspiration but with continued prolonged oral transit of bolus, excessive oral manipulation. One time cough post swallow noted with thin liquids but otherwise, patient able to protect airway. Current diet remains appropriate. Will continue current goals.    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 Intubated 3/24-28; reintubated 3/28 due to stridor, notes indicate difficult intubation.  Extubated 4/2. ENT direct laryngoscopy 4/2 revealed: "inflammatory changes of the posterior glottis as expected, small nodular granulation at the anterior tracheal wall at the site of the previous tracheostomy and some curvature to the right of the trachea.  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, he has significant decline in his functional status, with rehabilitation, he was eventually able to ambulate with walker, still with very unsteady ataxic gait, loss of hearing, slurred speech, difficulty reading, he had regained significant recovery from 2016, was able to begin to read his Bible again      SLP Plan  Continue with current plan of care       Recommendations  Diet recommendations: Thin liquid;Dysphagia 1 (puree) Liquids provided via: Cup;Straw Medication Administration: Crushed with puree Supervision: Staff to assist with self feeding;Full supervision/cueing for compensatory strategies Compensations: Small sips/bites;Minimize environmental distractions Postural Changes and/or Swallow Maneuvers: Seated upright 90 degrees                Oral Care Recommendations: Oral care BID Follow up Recommendations: Inpatient Rehab SLP Visit Diagnosis: Dysphagia, oropharyngeal phase (R13.12) Attention and concentration deficit following: Nontraumatic intracerebral hemorrhage Plan: Continue with current plan of care       [place], [place] [phone]      Treyshaun Keatts Meryl 04/24/2017, 10:10 AM

## 2017-04-24 NOTE — Progress Notes (Signed)
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  Apparently fell 2 weeks PTA hitting his head.   CT scan showed large SDH with minimal midline shift R greater than L side. Extubated 4/2   OT comments  Pt significantly improved today.  He followed one step commands ~60% of the time. 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  He was able to ambulate with min A +2, and brushed teeth with max A hand over hand assist while standing at sink, but was able to take over task at end of session with requiring min A to complete task.  He will benefit from CIR.    Follow Up Recommendations  CIR;Supervision/Assistance - 24 hour    Equipment Recommendations  None recommended by OT    Recommendations for Other Services      Precautions / Restrictions Precautions Precautions: Fall       Mobility Bed Mobility Overal bed mobility: Needs Assistance Bed Mobility: Supine to Sit   Sidelying to sit: Max assist       General bed mobility comments: Pt requires assist to initiate all aspects of activiites.  He initially was rigid and somewhat resistive to movement.  He requires increased time to process commands and initate activity   Transfers Overall transfer level: Needs assistance Equipment used: Rolling walker (2 wheeled);2 person hand held assist Transfers: Sit to/from [facility] Sit to Stand:  Min assist;+2 physical assistance;+2 safety/equipment Stand pivot transfers: Min assist;+2 physical assistance;+2 safety/equipment       General transfer comment: Pt required min A to move into standing, and min A to steady     Balance Overall balance assessment: Needs assistance Sitting-balance support: Single extremity supported;Bilateral upper extremity supported;No upper extremity supported;Feet supported Sitting balance-Leahy Scale: Poor Sitting balance - Comments: Pt requires min A initially for static sitting, progressed to close min guard assist    Standing balance support: Single extremity supported;During functional activity Standing balance-Leahy Scale: Poor Standing balance comment: Requires min A for static standing                            ADL either performed or assessed with clinical judgement   ADL Overall ADL's : Needs assistance/impaired     Grooming: Oral care;Maximal assistance;Standing Grooming Details (indicate cue type and reason): Requires hand over hand assist.  He demonstrated difficulty orienting toothbrugh appropriately.  After signfiicant repetition, pt took over task and performed with min A in standing                  Toilet Transfer: Minimal assistance;Ambulation;Comfort height toilet;BSC;RW;Grab bars   Toileting- Clothing Manipulation and Hygiene: Minimal assistance;Sit to/from stand       Functional mobility during ADLs: Minimal assistance;+2 for physical assistance;+2 for safety/equipment;Rolling walker       Vision   Additional Comments: Pt with Rt gaze preference, but will spontaneously look to Rt when stimuli presented    Perception     Praxis      Cognition Arousal/Alertness: Awake/alert Behavior During Therapy: Flat affect Overall  Cognitive Status: Impaired/Different from baseline Area of Impairment: Orientation;Attention;Memory;Following commands;Problem solving                 Orientation Level:  Disoriented to;Place;Time;Situation Current Attention Level: Sustained Memory: Decreased short-term memory Following Commands: Follows one step commands with increased time;Follows one step commands inconsistently     Problem Solving: Slow processing;Decreased initiation;Difficulty sequencing;Requires verbal cues;Requires tactile cues General Comments: Pt is HOH and responds well with information presented on the Rt.  He follows one step commands ~60% of the time, with a delay.   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  Requires max A for sequencing during grooming tasks         Exercises     Shoulder Instructions       General Comments HR to 143     Pertinent Vitals/ Pain       Pain Assessment: Faces Faces Pain Scale: No hurt  Home Living                                          Prior Functioning/Environment              Frequency  Min 2X/week        Progress Toward Goals  OT Goals(current goals can now be found in the care plan section)  Progress towards OT goals: Progressing toward goals     Plan Discharge plan remains appropriate    Co-evaluation                 AM-PAC PT "6 Clicks" Daily Activity     Outcome Measure   Help from another person eating meals?: A Lot Help from another person taking care of personal grooming?: A Lot Help from another person toileting, which includes using toliet, bedpan, or urinal?: A Lot Help from another person bathing (including washing, rinsing, drying)?: A Lot Help from another person to put on and taking off regular upper body clothing?: A Lot Help from another person to put on and taking off regular lower body clothing?: Total 6 Click Score: 11    End of Session Equipment Utilized During Treatment: Gait belt  OT Visit Diagnosis: Unsteadiness on feet (R26.81);Other abnormalities of gait and mobility (R26.89);Muscle weakness (generalized) (M62.81);Other  symptoms and signs involving cognitive function   Activity Tolerance Patient tolerated treatment well   Patient Left in chair;with call bell/phone within reach;with nursing/sitter in room   Nurse Communication Mobility status        Time: 1610-9604 OT Time Calculation (min): 28 min  Charges: OT General Charges $OT Visit: 1 Visit OT Treatments $Neuromuscular Re-education: 8-22 mins  [facility], OTR/L [phone]    Lucille Passy M 04/24/2017, 12:28 PM

## 2017-04-24 NOTE — Progress Notes (Signed)
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  Apparently fell 2 weeks PTA hitting his head.   CT scan showed large SDH with minimal midline shift R greater than L side. Extubated 4/2    PT Comments    Pt awake alert and talking during session today. Pt able to state name, reply yes and no to questions and responding to commands when directed loudly in right ear. Pt smiling and responsive to assist with delay. Pt with greatly improved command following, mobility and gait. Pt with progression to min +2 for gait today and able to walk in hall. Pt moving bil UE when cued to do so. Continue to recommend CIR as pt progressing well with excellent home support.    Follow Up Recommendations  CIR;Supervision/Assistance - 24 hour     Equipment Recommendations  None recommended by PT    Recommendations for Other Services       Precautions / Restrictions Precautions Precautions: Fall    Mobility  Bed Mobility Overal bed mobility: Needs Assistance Bed Mobility: Supine to Sit   Supine to sit: Mod assist;+2 for safety/equipment     General bed mobility comments: Pt requires assist to initiate all aspects of activiites.  He initially was rigid and somewhat resistive to movement.  He requires increased time to process commands and initate activity with cues and assist to initiate legs off of bed and elevating trunk with HOB 30 degrees then pt able to take over and  scoot to EOB  Transfers Overall transfer level: Needs assistance Equipment used: Rolling walker (2 wheeled);2 person hand held assist Transfers: Sit to/from Stand Sit to Stand: Min assist;+2 physical assistance;+2 safety/equipment        General transfer comment: Pt required min A to move into standing, and min A to steady, increased time to process and initiate  Ambulation/Gait Ambulation/Gait assistance: Min assist;+2 physical assistance;+2 safety/equipment;Mod assist Ambulation Distance (Feet): 150 Feet Assistive device: Rolling walker (2 wheeled) Gait Pattern/deviations: Step-through pattern;Decreased stride length;Trunk flexed   Gait velocity interpretation: Below normal speed for age/gender General Gait Details: pt able to walk with RW with assist to initiate mod +2 with progression to min +2 with decreased speed, veering left with assist to reposition RW and trunk. walked without Rw grossly 10' with pt with increased hesitation    Stairs            Wheelchair Mobility    Modified Rankin (Stroke Patients Only) Modified Rankin (Stroke Patients Only) Pre-Morbid Rankin Score: Moderate disability Modified Rankin: Severe disability     Balance Overall balance assessment: Needs assistance Sitting-balance support: Single extremity supported;Bilateral upper extremity supported;No upper extremity supported;Feet supported Sitting balance-Leahy Scale: Poor Sitting balance - Comments: Pt requires min A initially for static sitting, progressed to close min guard assist    Standing balance support: Single extremity supported;During functional activity Standing balance-Leahy Scale: Poor Standing balance comment: Requires min A for static standing                             Cognition Arousal/Alertness: Awake/alert Behavior During  Therapy: Flat affect Overall Cognitive Status: Impaired/Different from baseline Area of Impairment:  Orientation;Attention;Memory;Following commands;Problem solving                 Orientation Level: Disoriented to;Place;Time;Situation Current Attention Level: Sustained Memory: Decreased short-term memory Following Commands: Follows one step commands with increased time;Follows one step commands inconsistently     Problem Solving: Slow processing;Decreased initiation;Difficulty sequencing;Requires verbal cues;Requires tactile cues General Comments: Pt is HOH and responds well with information presented on the Rt.  He follows one step commands ~60% of the time, with a delay.   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  Requires max A for sequencing during grooming tasks       Exercises      General Comments General comments (skin integrity, edema, etc.): HR to 143       Pertinent Vitals/Pain Pain Assessment: No/denies pain Faces Pain Scale: No hurt    Home Living   Living Arrangements: Parent(Lives with his parents since 1994; attached apartment to hi) Available Help at Discharge: Family;Available 24 hours/day(Mom, Pamala Hurry, is 37 and capable; Dad is legally blind, niece) Type of Home: Apartment(apartment built attached to Rhonda's home) Home Access: Ramped entrance   Home Layout: One level   Additional Comments: Mom clarified PLOF and home set up on 04/24/17    Prior Function    Gait / Transfers Assistance Needed: Mom reports Mod I with RW; feeds self, bathes self with her supervision   Comments: clarified with Mom   PT Goals (current goals can now be found in the care plan section) Progress towards PT goals: Progressing toward goals    Frequency           PT Plan Current plan remains appropriate    Co-evaluation              AM-PAC PT "6 Clicks" Daily Activity  Outcome Measure  Difficulty turning over in bed (including adjusting bedclothes, sheets and blankets)?: A Lot Difficulty moving from lying on back to sitting on  the side of the bed? : A Lot Difficulty sitting down on and standing up from a chair with arms (e.g., wheelchair, bedside commode, etc,.)?: A Lot Help needed moving to and from a bed to chair (including a wheelchair)?: A Lot Help needed walking in hospital room?: A Lot Help needed climbing 3-5 steps with a railing? : A Lot 6 Click Score: 12    End of Session Equipment Utilized During Treatment: Gait belt Activity Tolerance: Patient tolerated treatment well Patient left: in chair;with call bell/phone within reach;with chair alarm set;with nursing/sitter in room Nurse Communication: Mobility status;Precautions PT Visit Diagnosis: Other abnormalities of gait and mobility (R26.89);Muscle weakness (generalized) (M62.81);Other symptoms and signs involving the nervous system (R29.898)     Time: 4315-4008 PT Time Calculation (min) (ACUTE ONLY): 31 min  Charges:  $Gait Training: 8-22 mins                    G Codes:       Elwyn Reach, PT [phone]    [facility] 04/24/2017, 12:45 PM

## 2017-04-24 NOTE — Progress Notes (Signed)
Pt admitted to unit with no family at bedside. Pt A&O to self with delayed responses and slurred speech evident. RN reviewed safety plan with pt and assessed his awareness of the use of the call bell. Pt unable to relay call buttons for the need for nursing or use of call bell. Pt only able to follow simple commands with max cues and extra time needed. Pt also has no awareness of b/b incidents with assessment for a need for condom cath. Pt was actively voiding with assessing bladder function and he was unaware of void. RN unable to complete any of the admission questions due to cognition. SRx3 in place, bed alarm in use and call bell with in reach. RN continues to educate use of call bell with pt.

## 2017-04-24 NOTE — Progress Notes (Signed)
I met with pt at bedside, observed him work with PT and OT ambulating in hallway, and then contacted his Feliciana Rossetti by phone. Mom and family can provide 24/7 physical assist of pt in the home. He has lived with his parents since 11. Pt and his parents live in an attached apartment to daughter's Rhonda's home. Mom, grand daughter, [place] and pt's siblings prefer inpt rehab and then will provide for him in his apartment with his parents. I will discuss with Dr. [name] and follow up today. [phone]

## 2017-04-24 NOTE — Care Management Note (Signed)
Case Management Note  Patient Details  Name: Glen [name] MRN: [id number] Date of Birth: 24-Mar-1970  Subjective/Objective:   Pt admitted on 04/09/17 with increasing confusion after falling approximately 2 weeks ago.  Pt found to have large RT SDH; s/p craniotomy on 04/09/17.  PTA, pt resided at home with mother.                   Action/Plan: Pt currently remains sedated and intubated.  Will follow for discharge planning as pt progresses.    Expected Discharge Date:  04/24/17               Expected Discharge Plan:  [place]  In-House Referral:     Discharge planning Services  CM Consult  Post Acute Care Choice:    Choice offered to:     DME Arranged:    DME Agency:     HH Arranged:    HH Agency:     Status of Service:  Completed, signed off  If discussed at [employer] [facility] [employer], dates discussed:    Additional Comments:  04/24/17 J. Adarrius Graeff, RN, BSN Pt medically stable for dc and pt has been accepted for admission to [facility] today.    Reinaldo Raddle, RN, BSN  Trauma/Neuro ICU Case Manager [phone]

## 2017-04-24 NOTE — Progress Notes (Signed)
I discussed with Dr.Swartz and contacted Dr. Saintclair Halsted for approval to admit pt to CIR today. I met with pt, his parents and brother at bedside. They are in agreement to admit today. RN CM, Almyra Free, made aware. I will make the arrangements to admit today. [phone]

## 2017-04-24 NOTE — Progress Notes (Signed)
Meredith Staggers, MD  Physician  Physical Medicine and Rehabilitation  PMR Pre-admission  Addendum  Date of Service:  04/24/2017 12:34 PM       Related encounter: ED to Hosp-Admission (Current) from 04/09/2017 in [place]           [] Hide copied text  [] Hover for details   PMR Admission Coordinator Pre-Admission Assessment  Patient: Glen [name] is an 47 y.o., male MRN: [id number] DOB: Feb 17, 1970 Height: 5\' 10"  (177.8 cm) Weight: 86.1 kg (189 lb 13.1 oz)                                                                                                                                                  Insurance Information HMO:     PPO:      PCP:      IPA:      80/20: yes     OTHER: no HMO PRIMARY: Medicare a and b      Policy#: [id number]      Subscriber: pt Benefits:  Phone #: passport one online     Name: 04/24/2017 Eff. Date: 11/17/1993     Deduct: $1364      Out of Pocket Max: none      Life Max: none CIR: 100%      SNF: 20 full days Outpatient: 80%     Co-Pay: 20% Home Health: 100%      Co-Pay: none DME: 80%     Co-Pay: 20% Providers: pt choice  SECONDARY: Medicaid  Of [place]  Policy#: [id number]     Subscriber: pt  Medicaid Application Date:       Case Manager:  Disability Application Date:       Case Worker:   Patient is  disabled  Emergency Contact Information         Contact Information    Name Relation Home Work Mobile   [place] Mother   [phone]   Terrilee Files [phone]       Current Medical History  Patient Admitting Diagnosis: right SDH after fall  History of Present Illness:  DTO:IZTIWP L Lambethis a 47 y.o.right-handed malewith history of glioblastoma status post surgical removal tumor 1993 and autoimmune cerebritis, prior CVA 2015, 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 Krista Blue. Presented 04/09/2017 upon transfer from [facility] with altered mental status times 2 days as well as reports of recent falls. There was initial concern for some possible sepsis with noted low-grade fever and elevated WBC and treated with Rocephin while at [facility]. Cranial CT scan from outside hospital showed large subdural hematoma on the right. Required intubation with difficult airway noted inspiratory stridor was intubated by ENT prior to going to the OR. Underwent craniotomy hematoma evacuation 04/10/2017 per Dr. [name]. Hospital course noted seizures EEG with  generalized cerebral dysfunction follow-up per neurology services and Keppra was adjustedas well as the addition of Vimpat. Patient slowly weaned from ventilator. Later underwent transnasal fiberoptic laryngoscopy 04/18/2017 per Dr. [name] after initial stridor and follow-up after extubation.MRSA PCR screening positive for contact precautions.Currently maintained on a dysphagia 1 thin liquid diet.   Past Medical History      Past Medical History:  Diagnosis Date  . Brain cancer ([place])   . Hypercholesteremia   . Seizure ([place])   . Stroke ([place])   . Thyroid disorder     Family History  family history is not on file. He was adopted.  Prior Rehab/Hospitalizations:  Has the patient had major surgery during 100 days prior to admission? No   CIR 2004 with Dr. [name]. [facility] to [facility]. Then returned home with his parents  Current Medications   Current Facility-Administered Medications:  .  acetaminophen (TYLENOL) tablet 650 mg, 650 mg, Oral, Q4H PRN, 650 mg at 04/11/17 1548 **OR** acetaminophen (TYLENOL) suppository 650 mg, 650 mg, Rectal, Q4H PRN, [name], MD, 650 mg at 04/10/17 0755 .  albuterol (PROVENTIL) (2.5 MG/3ML) 0.083% nebulizer solution 2.5 mg, 2.5 mg, Nebulization, Q4H PRN, [name], MD .  atorvastatin (LIPITOR) tablet 20 mg, 20 mg, Oral, Daily, [name], MD, 20 mg at  04/24/17 1129 .  bisacodyl (DULCOLAX) suppository 10 mg, 10 mg, Rectal, Daily PRN, [name], MD .  docusate (COLACE) 50 MG/5ML liquid 100 mg, 100 mg, Per Tube, BID PRN, [name], MD, 100 mg at 04/13/17 0904 .  feeding supplement (ENSURE ENLIVE) (ENSURE ENLIVE) liquid 237 mL, 237 mL, Oral, BID BM, Erline Levine, MD, 237 mL at 04/23/17 1055 .  FLUoxetine (PROZAC) capsule 20 mg, 20 mg, Oral, Daily, [name], MD, 20 mg at 04/24/17 1126 .  HYDROmorphone (DILAUDID) injection 0.25-0.5 mg, 0.25-0.5 mg, Intravenous, Q5 min PRN, Myrtie Soman, MD .  labetalol (NORMODYNE,TRANDATE) injection 10-40 mg, 10-40 mg, Intravenous, Q10 min PRN, [name], MD .  lacosamide (VIMPAT) tablet 200 mg, 200 mg, Oral, BID, Erline Levine, MD, 200 mg at 04/24/17 1126 .  levETIRAcetam (KEPPRA) 100 MG/ML solution 1,500 mg, 1,500 mg, Oral, BID, Erline Levine, MD, 1,500 mg at 04/24/17 1251 .  levothyroxine (SYNTHROID, LEVOTHROID) tablet 50 mcg, 50 mcg, Oral, QAC breakfast, [name], MD, 50 mcg at 04/24/17 1129 .  ondansetron (ZOFRAN) tablet 4 mg, 4 mg, Oral, Q4H PRN **OR** ondansetron (ZOFRAN) injection 4 mg, 4 mg, Intravenous, Q4H PRN, [name], MD .  polyethylene glycol (MIRALAX / GLYCOLAX) packet 17 g, 17 g, Oral, Daily, [name], MD, 17 g at 04/24/17 1125 .  promethazine (PHENERGAN) injection 6.25-12.5 mg, 6.25-12.5 mg, Intravenous, Q15 min PRN, Myrtie Soman, MD .  senna-docusate (Senokot-S) tablet 1 tablet, 1 tablet, Oral, QHS PRN, [name], MD  Patients Current Diet: DIET - DYS 1 Room service appropriate? Yes; Fluid consistency: Thin Fall precautions Seizure precautions Aspiration precautions  Precautions / Restrictions Precautions Precautions: Fall Precaution Comments: ETT 23cm Restrictions Weight Bearing Restrictions: No   Has the patient had 2 or more falls or a fall with injury in the past year?No  Prior Activity Level Limited Community (1-2x/wk): Mod I with RW; does  not drive  Development worker, international aid / Equipment Home Assistive Devices/Equipment: Built-in shower seat, Dentures (specify type), Eyeglasses, Shower chair with back, Walker (specify type), Wheelchair  Prior Device Use: Indicate devices/aids used by the patient prior to current illness, exacerbation or injury? Gilford Rile  Prior Functional Level Prior Function Level of Independence: Needs assistance Gait / Transfers Assistance Needed: Mom reports Mod I with RW; feeds self, bathes self with her supervision Comments: clarified with Mom  Self Care: Did the patient need help bathing, dressing, using the toilet or eating?  Needed some help  Indoor Mobility: Did the patient need assistance with walking from room to room (with or without device)? Independent  Stairs: Did the patient need assistance with internal or external stairs (with or without device)? Needed some help  Functional Cognition: Did the patient need help planning regular tasks such as shopping or remembering to take medications? Needed some help  Current Functional Level Cognition  Arousal/Alertness: Awake/alert Overall Cognitive Status: Impaired/Different from baseline Difficult to assess due to: Hard of hearing/deaf Current Attention Level: Sustained Orientation Level: Other (comment)(unable to assess no speech) Following Commands: Follows one step commands with increased time, Follows one step commands inconsistently Safety/Judgement: Decreased awareness of deficits, Decreased awareness of safety General Comments: Pt is HOH and responds well with information presented on the Rt.  He follows one step commands ~60% of the time, with a delay.   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  Requires max A for sequencing during grooming tasks  Attention: Focused Focused Attention: Impaired Focused Attention Impairment: Functional basic, Verbal basic    Extremity Assessment (includes  Sensation/Coordination)  Upper Extremity Assessment: Difficult to assess due to impaired cognition, RUE deficits/detail, LUE deficits/detail RUE Deficits / Details: WFL strength. Poor initation of movement and difficulty with coorindation. Pt with rapid movements and difficulty with motor planning and control. LUE Deficits / Details: WFL strength. Poor initation of movement and difficulty with coorindation. Pt with rapid movements and difficulty with motor planning and control.  Lower Extremity Assessment: Defer to PT evaluation RLE Deficits / Details: moves LE spontaneously against gravity in bed.  bears full weight in standing.  movement is uncoordinated RLE Coordination: decreased fine motor LLE Deficits / Details: see R LE, L LE appears more uncoordinated and lower tone than R LE LLE Coordination: decreased fine motor, decreased gross motor    ADLs  Overall ADL's : Needs assistance/impaired Eating/Feeding: NPO Grooming: Oral care, Maximal assistance, Standing Grooming Details (indicate cue type and reason): Requires hand over hand assist.  He demonstrated difficulty orienting toothbrugh appropriately.  After signfiicant repetition, pt took over task and performed with min A in standing  Upper Body Bathing: Maximal assistance, Sitting Lower Body Bathing: Maximal assistance, Bed level Upper Body Dressing : Maximal assistance, Sitting Lower Body Dressing: Maximal assistance, Bed level Lower Body Dressing Details (indicate cue type and reason): Donned socks Toilet Transfer: Minimal assistance, Ambulation, Comfort height toilet, BSC, RW, Grab bars Toileting- Clothing Manipulation and Hygiene: Minimal assistance, Sit to/from stand Functional mobility during ADLs: Minimal assistance, +2 for physical assistance, +2 for safety/equipment, Rolling walker General ADL Comments: Did not engage in ADL tasks despite max cueing     Mobility  Overal bed mobility: Needs Assistance Bed Mobility:  Supine to Sit Rolling: Max assist, +2 for physical assistance Sidelying to sit: Max assist Supine to sit: Mod assist, +2 for safety/equipment Sit to supine: Min assist General bed mobility comments: Pt requires assist to initiate all aspects of activiites.  He initially was rigid and somewhat resistive to movement.  He requires increased time to process commands and initate activity with cues and assist to initiate legs off of bed and elevating trunk with HOB 30 degrees then pt able to take over  and scoot to EOB    Transfers  Overall transfer level: Needs assistance Equipment used: Rolling walker (2 wheeled), 2 person hand held assist Transfers: Sit to/from Stand Sit to Stand: Min assist, +2 physical assistance, +2 safety/equipment Stand pivot transfers: Min assist, +2 physical assistance, +2 safety/equipment General transfer comment: Pt required min A to move into standing, and min A to steady, increased time to process and initiate    Ambulation / Gait / Stairs / Wheelchair Mobility  Ambulation/Gait Ambulation/Gait assistance: Min assist, +2 physical assistance, +2 safety/equipment, Mod assist Ambulation Distance (Feet): 150 Feet Assistive device: Rolling walker (2 wheeled) Gait Pattern/deviations: Step-through pattern, Decreased stride length, Trunk flexed General Gait Details: pt able to walk with RW with assist to initiate mod +2 with progression to min +2 with decreased speed, veering left with assist to reposition RW and trunk. walked without Rw grossly 10' with pt with increased hesitation  Gait velocity interpretation: Below normal speed for age/gender    Posture / Balance Dynamic Sitting Balance Sitting balance - Comments: Pt requires min A initially for static sitting, progressed to close min guard assist  Balance Overall balance assessment: Needs assistance Sitting-balance support: Single extremity supported, Bilateral upper extremity supported, No upper extremity  supported, Feet supported Sitting balance-Leahy Scale: Poor Sitting balance - Comments: Pt requires min A initially for static sitting, progressed to close min guard assist  Postural control: Right lateral lean, Posterior lean Standing balance support: Single extremity supported, During functional activity Standing balance-Leahy Scale: Poor Standing balance comment: Requires min A for static standing     Special needs/care consideration BiPAP/CPAP  N/a CPM  N/a Continuous Drip IV  N/a Dialysis  N/a Life Vest  N/a Oxygen  N/a Special Bed  N/a Trach Size n/a Wound Vac n/a Skin surgical incision. Small red area to left of forehead, dry scaly skin, abrasion right arm, rash face and neck noted Bowel mgmt: incontinent LBM 04/21/17 Bladder mgmt:external catheter Diabetic mgmt n/a Mom manages his meds; HOH since 2015   Previous Home Environment Living Arrangements: Parent(Lives with his parents since 1994; attached apartment to hi)  Lives With: (parents) Available Help at Discharge: Family, Available 24 hours/day(Mom, Pamala Hurry, is 83 and capable; Dad [place] legaaly blind, niece) Type of Home: Apartment(apartment built attatched to Rhonda's home) Home Layout: One level Home Access: Ramped entrance Bathroom Shower/Tub: Tub/shower unit, Architectural technologist: Standard Bathroom Accessibility: Yes How Accessible: Accessible via walker [place]: No Additional Comments: Mom clarified PLOF and home set up on 04/24/17  Lives with parents since 1994. Attached apartment/suite to his sister's home, [name]. He is a retired Mining engineer. Now disabled. Divorced with a 21 year old son that he has not seen in 2 years. [name] works, her 80 year old daughter, Marye Round , is in the home and currently unemployed. Family very supportive of [name].  Discharge Living Setting Plans for Discharge Living Setting: Lives with (comment), Apartment(parents in attatched inlaw apartment) Type of Home at  Discharge: Apartment Discharge Home Layout: One level Discharge Home Access: [place] entrance Discharge Bathroom Shower/Tub: Tub/shower unit, Curtain Discharge Bathroom Toilet: Standard Discharge Bathroom Accessibility: Yes How Accessible: Accessible via walker Does the patient have any problems obtaining your medications?: No  Social/Family/Support Systems Patient Roles: Parent(has 7 year old son he has not seem in 2 years) Contact Information: Pamala Hurry, his Mom and sister, [name] Anticipated Caregiver: Mom, sisters, brother and niece, [place] Anticipated Ambulance person Information: see above Ability/Limitations of Caregiver: Mom is 76 years old and  can do superivsion to min assist Caregiver Availability: 24/7 Discharge Plan Discussed with Primary Caregiver: Yes Is Caregiver In Agreement with Plan?: Yes Does Caregiver/Family have Issues with Lodging/Transportation while Pt is in Rehab?: No  Goals/Additional Needs Patient/Family Goal for Rehab: min assist with PT, OT, and SLP Expected length of stay: ELOS 20 plus days per MD Additional Information: 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 Issues have been addressed and update has been discussed with Dr. [name] and patient now appropriate for inpatient rehabilitation. Will admit to inpatient rehab today.   Preadmission Screen  Completed By:  Cleatrice Burke, 04/24/2017 2:27 PM ______________________________________________________________________   Discussed status with Dr. [name] on 04/24/2017 at  1429 and received telephone approval for admission today.  Admission Coordinator:  Cleatrice Burke, time [id number] Date 04/24/2017         Revision History

## 2017-04-24 NOTE — H&P (Signed)
Physical Medicine and Rehabilitation Admission H&P    Chief Complaint  Patient presents with  . Subdural Hematoma    Transfer- [facility]  : HPI: Mr. Glen [name] is a 47 year old right-handed male with history of glioblastoma status post surgical removal tumor/VP shunt in 1993 by Dr. 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 Krista Blue.  Presented 04/09/2017 upon transfer from [facility] with altered mental status times 2 days as well as reports of recent falls.  Per chart review uses a walker was able to speak and interact with family.  There was initial concern for possible sepsis with noted low-grade fever elevated WBC and treated with Rocephin while at [facility].  Cranial CT scan from outside hospital showed large subdural hematoma on the right.  Required intubation with difficult airway noted inspiratory stridor requiring intubation by ENT prior to going to the OR where he underwent craniotomy hematoma evacuation 04/10/2017 by Dr. [name].  Hospital course noted seizures with EEG showing generalized cerebral dysfunction follow-up per neurology services maintained on Keppra as well as adjustments in the addition of Vimpat.  Patient slowly weaned from ventilator.  Later underwent transnasal fiberoptic laryngoscopy 04/18/2017 per Dr. [name] after initial stridor and follow-up after extubation.  Bouts of leukocytosis felt to be reactive due to steroids.  MRSA PCR screen positive with contact precautions.  Dysphagia #1 thin liquid diet.  Physical and occupational therapy ongoing patient was admitted for a comprehensive rehab program    Review of Systems  Unable to perform ROS: Acuity of condition   Past Medical History:  Diagnosis Date  . Brain cancer ([place])   . Hypercholesteremia   . Seizure ([place])   . Stroke ([place])   . Thyroid disorder    Past Surgical History:  Procedure  Laterality Date  . APPENDECTOMY     1980s  . BRAIN SURGERY     1993  . CRANIOTOMY Right 04/09/2017   Procedure: CRANIOTOMY HEMATOMA EVACUATION SUBDURAL;  Surgeon: Erline Levine, MD;  Location: [place];  Service: Neurosurgery;  Laterality: Right;  . DIRECT LARYNGOSCOPY N/A 04/09/2017   Procedure: DIRECT LARYNGOSCOPY;  Surgeon: Erline Levine, MD;  Location: [place];  Service: Neurosurgery;  Laterality: N/A;  . DIRECT LARYNGOSCOPY N/A 04/09/2017   Procedure: DIRECT LARYNGOSCOPY;  Surgeon: [name], MD;  Location: [place];  Service: ENT;  Laterality: N/A;  . DIRECT LARYNGOSCOPY N/A 04/18/2017   Procedure: DIRECT LARYNGOSCOPY;  Surgeon: [name], MD;  Location: [place];  Service: ENT;  Laterality: N/A;  . TRACHEOSTOMY TUBE PLACEMENT N/A 04/18/2017   Procedure: TRANSNASAL FIBER OPTIC LARYNGOSCOPY;  Surgeon: [name], MD;  Location: [place];  Service: ENT;  Laterality: N/A;   Family History  Adopted: Yes   Social History:  reports that he has never smoked. He has never used smokeless tobacco. He reports that he does not drink alcohol or use drugs. Allergies:  Allergies  Allergen Reactions  . Heparin Anaphylaxis    Patient has HITT  . Lidocaine Hives  . Depakote [Divalproex Sodium] Rash  . Phenytoin Sodium Extended Rash   Medications Prior to Admission  Medication Sig Dispense Refill  . atorvastatin (LIPITOR) 20 MG tablet Take 20 mg by mouth daily.  2  . divalproex (DEPAKOTE) 250 MG DR tablet Take 250-500 mg by mouth 2 (two) times daily. 250mg  in the morning and 500mg  at bedtime  0  . FLUoxetine (PROZAC) 20 MG capsule Take by  mouth daily.  2  . fondaparinux (ARIXTRA) 2.5 MG/0.5ML SOLN injection INJECT 2.5 MG SUBCUTANEOUSLY DAILY  2  . levETIRAcetam (KEPPRA) 750 MG tablet Take 1 tablet (750 mg total) by mouth every 12 (twelve) hours. 60 tablet 0  . levothyroxine (SYNTHROID, LEVOTHROID) 75 MCG tablet Take 75 mcg by mouth daily.  2  . loratadine (CLARITIN) 10 MG tablet Take 10 mg by mouth  daily.  1    Drug Regimen Review  Drug regimen was reviewed and remains appropriate with no significant issues identified  Home: Home Living Family/patient expects to be discharged to:: Private residence Living Arrangements: Parent(Lives with his parents since 16; attached apartment to hi) Available Help at Discharge: Family, Available 24 hours/day(Mom, Pamala Hurry, is 66 and capable; Dad [place] legaaly blind, niece) Type of Home: Apartment(apartment built attatched to Rhonda's home) Home Access: Ramped entrance Home Layout: One level Bathroom Shower/Tub: Tub/shower unit, Architectural technologist: Standard Bathroom Accessibility: Yes Additional Comments: Mom clarified PLOF and home set up on 04/24/17  Lives With: (parents)   Functional History: Prior Function Level of Independence: Needs assistance Gait / Transfers Assistance Needed: Mom reports Mod I with RW; feeds self, bathes self with her supervision Comments: clarified with Mom  Functional Status:  Mobility: Bed Mobility Overal bed mobility: Needs Assistance Bed Mobility: Supine to Sit Rolling: Max assist, +2 for physical assistance Sidelying to sit: Max assist Supine to sit: Mod assist, +2 for safety/equipment Sit to supine: Min assist General bed mobility comments: Pt requires assist to initiate all aspects of activiites.  He initially was rigid and somewhat resistive to movement.  He requires increased time to process commands and initate activity with cues and assist to initiate legs off of bed and elevating trunk with HOB 30 degrees then pt able to take over and scoot to EOB Transfers Overall transfer level: Needs assistance Equipment used: Rolling walker (2 wheeled), 2 person hand held assist Transfers: Sit to/from Stand Sit to Stand: Min assist, +2 physical assistance, +2 safety/equipment Stand pivot transfers: Min assist, +2 physical assistance, +2 safety/equipment General transfer comment: Pt required min A to move into  standing, and min A to steady, increased time to process and initiate Ambulation/Gait Ambulation/Gait assistance: Min assist, +2 physical assistance, +2 safety/equipment, Mod assist Ambulation Distance (Feet): 150 Feet Assistive device: Rolling walker (2 wheeled) Gait Pattern/deviations: Step-through pattern, Decreased stride length, Trunk flexed General Gait Details: pt able to walk with RW with assist to initiate mod +2 with progression to min +2 with decreased speed, veering left with assist to reposition RW and trunk. walked without Rw grossly 10' with pt with increased hesitation  Gait velocity interpretation: Below normal speed for age/gender    ADL: ADL Overall ADL's : Needs assistance/impaired Eating/Feeding: NPO Grooming: Oral care, Maximal assistance, Standing Grooming Details (indicate cue type and reason): Requires hand over hand assist.  He demonstrated difficulty orienting toothbrugh appropriately.  After signfiicant repetition, pt took over task and performed with min A in standing  Upper Body Bathing: Maximal assistance, Sitting Lower Body Bathing: Maximal assistance, Bed level Upper Body Dressing : Maximal assistance, Sitting Lower Body Dressing: Maximal assistance, Bed level Lower Body Dressing Details (indicate cue type and reason): Donned socks Toilet Transfer: Minimal assistance, Ambulation, Comfort height toilet, BSC, RW, Grab bars Toileting- Clothing Manipulation and Hygiene: Minimal assistance, Sit to/from stand Functional mobility during ADLs: Minimal assistance, +2 for physical assistance, +2 for safety/equipment, Rolling walker General ADL Comments: Did not engage in ADL tasks despite max cueing  Cognition: Cognition Overall Cognitive Status: Impaired/Different from baseline Arousal/Alertness: Awake/alert Orientation Level: Other (comment)(unable to assess no speech) Attention: Focused Focused Attention: Impaired Focused Attention Impairment: Functional  basic, Verbal basic Cognition Arousal/Alertness: Awake/alert Behavior During Therapy: Flat affect Overall Cognitive Status: Impaired/Different from baseline Area of Impairment: Orientation, Attention, Memory, Following commands, Problem solving Orientation Level: Disoriented to, Place, Time, Situation Current Attention Level: Sustained Memory: Decreased short-term memory Following Commands: Follows one step commands with increased time, Follows one step commands inconsistently Safety/Judgement: Decreased awareness of deficits, Decreased awareness of safety Problem Solving: Slow processing, Decreased initiation, Difficulty sequencing, Requires verbal cues, Requires tactile cues General Comments: Pt is HOH and responds well with information presented on the Rt.  He follows one step commands ~60% of the time, with a delay.   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  Requires max A for sequencing during grooming tasks  Difficult to assess due to: Hard of hearing/deaf  Physical Exam: Blood pressure 109/77, pulse (!) 104, temperature 98.1 F (36.7 C), resp. rate 19, height 5\' 10"  (1.778 m), weight 86.1 kg (189 lb 13.1 oz), SpO2 99 %. Physical Exam  Vitals reviewed. HENT:  Multiple nodules on head from prior surgeries, shuts, etc. Scalp wounds clean  Eyes: Right eye exhibits no discharge. Left eye exhibits no discharge.  Pupils round and reactive to light  Neck: Normal range of motion. Neck supple. No tracheal deviation present. No thyromegaly present.  Cardiovascular: Normal rate, regular rhythm and normal heart sounds. Exam reveals no friction rub.  No murmur heard. Respiratory: Effort normal and breath sounds normal. No respiratory distress.  GI: Soft. Bowel sounds are normal. He exhibits no distension.  Skin.  Warm and dry.  Craniotomy sites clean and dry Neurological.  Patient alert.  Sitting up in chair eating lunch with the assistance of his mother.  He did  make eye contact with examiner. Seemed to attempt to voice responses to my basic questions.  Inconsistent to follow one-step simple commands---struggled to initiate basic tasks and perseverated on others.  He moves right side preferrably over left, but inconsistent effort with basic MMT.  Psych: flat     Results for orders placed or performed during the hospital encounter of 04/09/17 (from the past 48 hour(s))  Glucose, capillary     Status: None   Collection Time: 04/22/17  3:19 PM  Result Value Ref Range   Glucose-Capillary 92 65 - 99 mg/dL  Glucose, capillary     Status: Abnormal   Collection Time: 04/22/17  8:28 PM  Result Value Ref Range   Glucose-Capillary 104 (H) 65 - 99 mg/dL  Glucose, capillary     Status: Abnormal   Collection Time: 04/22/17 11:46 PM  Result Value Ref Range   Glucose-Capillary 105 (H) 65 - 99 mg/dL  Glucose, capillary     Status: Abnormal   Collection Time: 04/23/17  4:16 AM  Result Value Ref Range   Glucose-Capillary 104 (H) 65 - 99 mg/dL  Glucose, capillary     Status: None   Collection Time: 04/23/17  8:19 AM  Result Value Ref Range   Glucose-Capillary 97 65 - 99 mg/dL  Glucose, capillary     Status: Abnormal   Collection Time: 04/23/17  5:09 PM  Result Value Ref Range   Glucose-Capillary 111 (H) 65 - 99 mg/dL   Comment 1 Notify RN    Comment 2 Document in Chart   Glucose, capillary     Status: Abnormal   Collection Time:  04/23/17  7:46 PM  Result Value Ref Range   Glucose-Capillary 130 (H) 65 - 99 mg/dL  Glucose, capillary     Status: None   Collection Time: 04/23/17 11:13 PM  Result Value Ref Range   Glucose-Capillary 86 65 - 99 mg/dL  Glucose, capillary     Status: None   Collection Time: 04/24/17  3:14 AM  Result Value Ref Range   Glucose-Capillary 80 65 - 99 mg/dL  Glucose, capillary     Status: None   Collection Time: 04/24/17  7:12 AM  Result Value Ref Range   Glucose-Capillary 92 65 - 99 mg/dL  Glucose, capillary     Status:  Abnormal   Collection Time: 04/24/17 11:41 AM  Result Value Ref Range   Glucose-Capillary 103 (H) 65 - 99 mg/dL   No results found.     Medical Problem List and Plan: 1.  Decreased functional mobility secondary to right subdural hematoma after fall complicated by acute hypoxic respiratory failure and stridor as well as history of glioblastoma and seizure disorder  -admit to inpatient rehab 2.  DVT Prophylaxis/Anticoagulation/history of heparin-induced thrombocytopenia: SCDs.  Chronic Arixtra currently on hold due to hematoma 3. Pain Management: Tylenol as needed 4. Mood: Prozac 20 mg daily 5. Neuropsych: This patient is capable of making decisions on his own behalf. 6. Skin/Wound Care: Routine skin checks 7. Fluids/Electrolytes/Nutrition: Routine INO's with follow-up chemistries upon admission 8.  Seizure disorder.  Vimpat 200 mg twice daily, Keppra 1500 mg twice daily.  Follow-up neurology services Dr. Krista Blue as outpatient 9.  Dysphagia.  Dysphagia #1 thin liquids.  Follow-up speech therapy   -full supervision with meals 10.  Hypothyroidism.  Synthroid 11.  Hyperlipidemia.  Lipitor 12.  Leukocytosis.  Felt to be reactive from recent Decadron 13.  MRSA PCR screening positive.  Maintain contact precautions 14.  Constipation.  Laxative assistance       Post Admission Physician Evaluation: 1. Functional deficits secondary  to Right SDH after fall. 2. Patient is admitted to receive collaborative, interdisciplinary care between the physiatrist, rehab nursing staff, and therapy team. 3. Patient's level of medical complexity and substantial therapy needs in context of that medical necessity cannot be provided at a lesser intensity of care such as a SNF. 4. Patient has experienced substantial functional loss from his/her baseline which was documented above under the "Functional History" and "Functional Status" headings.  Judging by the patient's diagnosis, physical exam, and functional  history, the patient has potential for functional progress which will result in measurable gains while on inpatient rehab.  These gains will be of substantial and practical use upon discharge  in facilitating mobility and self-care at the household level. 5. Physiatrist will provide 24 hour management of medical needs as well as oversight of the therapy plan/treatment and provide guidance as appropriate regarding the interaction of the two. 6. The Preadmission Screening has been reviewed and patient status is unchanged unless otherwise stated above. 7. 24 hour rehab nursing will assist with bladder management, bowel management, safety, skin/wound care, disease management, medication administration, pain management and patient education  and help integrate therapy concepts, techniques,education, etc. 8. PT will assess and treat for/with: Lower extremity strength, range of motion, stamina, balance, functional mobility, safety, adaptive techniques and equipment, NMR, family education.   Goals are: min assist to supervision. 9. OT will assess and treat for/with: ADL's, functional mobility, safety, upper extremity strength, adaptive techniques and equipment, NMR, family education.   Goals are: min to mod assist.  Therapy may potentially proceed with showering this patient. 10. SLP will assess and treat for/with: cognition, speech, language, swallowing.  Goals are: min to mod assist. 11. Case Management and Social Worker will assess and treat for psychological issues and discharge planning. 12. Team conference will be held weekly to assess progress toward goals and to determine barriers to discharge. 13. Patient will receive at least 3 hours of therapy per day at least 5 days per week. 14. ELOS: 20-30 days       15. Prognosis:  excellent     Meredith Staggers, MD, [place] [facility] 04/24/2017  Lavon Paganini [place], PA-C 04/24/2017

## 2017-04-24 NOTE — Progress Notes (Signed)
Meredith Staggers, MD  Physician  Physical Medicine and Rehabilitation  Consult Note  Signed  Date of Service:  04/20/2017 9:53 AM       Related encounter: ED to Hosp-Admission (Current) from 04/09/2017 in [place] All Collapse All      [] Hide copied text  [] Hover for details        Physical Medicine and Rehabilitation Consult Reason for Consult: Decreased functional mobility Referring Physician: Dr. [name]   HPI: Glen [name] is a 47 y.o. right-handed male with history of glioblastoma status post surgical removal tumor 1993 by Dr. 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 Krista Blue.  Presented 04/09/2017 upon transfer from [facility] with altered mental status times 2 days as well as reports of recent falls.  Per chart review patient uses a walker was able to speak and interact with family.  There was initial concern for some possible sepsis with noted low-grade fever and elevated WBC and treated with Rocephin while at [facility].  Cranial CT scan from outside hospital showed large subdural hematoma on the right.  Required intubation with difficult airway noted inspiratory stridor was intubated by ENT prior to going to the OR.  Underwent craniotomy hematoma evacuation 04/10/2017 per Dr. [name].  Hospital course noted seizures EEG with generalized cerebral dysfunction follow-up per neurology services and Keppra was adjusted as well as the addition of Vimpat.  Patient slowly weaned from ventilator.  Later underwent transnasal fiberoptic laryngoscopy 04/18/2017 per Dr. [name] after initial stridor and follow-up after extubation.  MRSA PCR screening positive for contact precautions.  Currently maintained on a dysphagia 1 thin liquid diet.  Therapy evaluations have been completed and recommendations of physical medicine  rehab consult.   Review of Systems  Unable to perform ROS: Acuity of condition       Past Medical History:  Diagnosis Date  . Brain cancer ([place])   . Hypercholesteremia   . Seizure ([place])   . Stroke ([place])   . Thyroid disorder         Past Surgical History:  Procedure Laterality Date  . APPENDECTOMY     1980s  . BRAIN SURGERY     1993  . CRANIOTOMY Right 04/09/2017   Procedure: CRANIOTOMY HEMATOMA EVACUATION SUBDURAL;  Surgeon: Erline Levine, MD;  Location: [place];  Service: Neurosurgery;  Laterality: Right;  . DIRECT LARYNGOSCOPY N/A 04/09/2017   Procedure: DIRECT LARYNGOSCOPY;  Surgeon: Erline Levine, MD;  Location: [place];  Service: Neurosurgery;  Laterality: N/A;  . DIRECT LARYNGOSCOPY N/A 04/09/2017   Procedure: DIRECT LARYNGOSCOPY;  Surgeon: [name], MD;  Location: [place];  Service: ENT;  Laterality: N/A;  . DIRECT LARYNGOSCOPY N/A 04/18/2017   Procedure: DIRECT LARYNGOSCOPY;  Surgeon: [name], MD;  Location: [place];  Service: ENT;  Laterality: N/A;  . TRACHEOSTOMY TUBE PLACEMENT N/A 04/18/2017   Procedure: TRANSNASAL FIBER OPTIC LARYNGOSCOPY;  Surgeon: [name], MD;  Location: [place];  Service: ENT;  Laterality: N/A;   Family History  Adopted: Yes   Social History:  reports that he has never smoked. He has never used smokeless tobacco. He reports that he does not drink alcohol or use drugs. Allergies:       Allergies  Allergen Reactions  . Heparin Anaphylaxis    Patient has HITT  . Lidocaine Hives  . Phenytoin  Sodium Extended Rash         Medications Prior to Admission  Medication Sig Dispense Refill  . atorvastatin (LIPITOR) 20 MG tablet Take 20 mg by mouth daily.  2  . divalproex (DEPAKOTE) 250 MG DR tablet Take 250-500 mg by mouth 2 (two) times daily. 250mg  in the morning and 500mg  at bedtime  0  . FLUoxetine (PROZAC) 20 MG capsule Take by mouth daily.  2  . fondaparinux (ARIXTRA) 2.5 MG/0.5ML SOLN injection INJECT 2.5 MG  SUBCUTANEOUSLY DAILY  2  . levETIRAcetam (KEPPRA) 750 MG tablet Take 1 tablet (750 mg total) by mouth every 12 (twelve) hours. 60 tablet 0  . levothyroxine (SYNTHROID, LEVOTHROID) 75 MCG tablet Take 75 mcg by mouth daily.  2  . loratadine (CLARITIN) 10 MG tablet Take 10 mg by mouth daily.  1    Home: Home Living Family/patient expects to be discharged to:: Private residence Living Arrangements: Parent Additional Comments: Pt unable to provide PLOF and home set up  Functional History: Prior Function Level of Independence: Needs assistance Gait / Transfers Assistance Needed: per MOM pt walks with RW Comments: No family present. Pt unable to provide further information on home set up and PLOF Functional Status:  Mobility: Bed Mobility Overal bed mobility: Needs Assistance Bed Mobility: Supine to Sit, Sit to Supine Rolling: Max assist Sidelying to sit: Max assist, +2 for physical assistance Supine to sit: Mod assist, +2 for physical assistance Sit to supine: Min assist General bed mobility comments: assist to pivot to EOB with assist and guidance for lower body and trunk. mIn assist with guarding lines and tubes for return to bed Transfers Overall transfer level: Needs assistance Equipment used: 2 person hand held assist Transfers: Sit to/from Stand Sit to Stand: Max assist, +2 physical assistance, Mod assist Stand pivot transfers: Max assist, +2 physical assistance General transfer comment: on initial trial pt max +2 to stand with assist for anterior translation and bil knees blocked, repeated 2 trials with mod assist +2 with pt able to maintain standing grossly 1 min on second trial with cues for anterior translation. With mod assist to advance bil LE pt able to side step toward [facility] Ambulation/Gait General Gait Details: not tested  ADL: ADL Overall ADL's : Needs assistance/impaired Eating/Feeding: NPO Grooming: Maximal assistance, Cueing for sequencing, Sitting Upper Body  Bathing: Maximal assistance, Sitting Lower Body Bathing: Maximal assistance, Bed level Upper Body Dressing : Maximal assistance, Sitting Lower Body Dressing: Maximal assistance, Bed level Lower Body Dressing Details (indicate cue type and reason): Donned socks Functional mobility during ADLs: Moderate assistance, Maximal assistance, +2 for physical assistance(sit<>stand only) General ADL Comments: Due to cognition and impulsivity, pt requiring Max A for ADLs for safety. Pt performing sit<>Stand transfer at EOB with Mod-Max A +2. Pt highly impulsive and restless. Difficulty following simple commands.   Cognition: Cognition Overall Cognitive Status: Difficult to assess Orientation Level: (P) Disoriented X4, Other (comment)(incomprehensible speech) Cognition Arousal/Alertness: Awake/alert Behavior During Therapy: Restless Overall Cognitive Status: Difficult to assess Area of Impairment: Following commands, Safety/judgement Following Commands: Follows one step commands inconsistently Safety/Judgement: Decreased awareness of deficits, Decreased awareness of safety General Comments: Following simple commands inconsistantly Difficult to assess due to: Intubated  Blood pressure 103/64, pulse 80, temperature 98.7 F (37.1 C), resp. rate (!) 24, height 5\' 10"  (1.778 m), weight 83.6 kg (184 lb 4.9 oz), SpO2 96 %. Physical Exam  Vitals reviewed. HENT:  Old craniotomy site well-healed  Eyes:  Pupils sluggish but reactive to  light  Neck: Normal range of motion. Neck supple. No thyromegaly present.  Cardiovascular: Normal rate, regular rhythm and normal heart sounds.  Respiratory: Effort normal and breath sounds normal. No respiratory distress.  GI: Soft. Bowel sounds are normal. He exhibits no distension.  Neurological:  Patient is alert but nonverbal.  He would not follow commands.  He does appear to track people in the room but inconsistently.  Noted facial twitching.  Skin: Skin is warm  and dry.    LabResultsLast24Hours       Results for orders placed or performed during the hospital encounter of 04/09/17 (from the past 24 hour(s))  Glucose, capillary     Status: Abnormal   Collection Time: 04/19/17 11:47 AM  Result Value Ref Range   Glucose-Capillary 109 (H) 65 - 99 mg/dL  Glucose, capillary     Status: Abnormal   Collection Time: 04/19/17  3:41 PM  Result Value Ref Range   Glucose-Capillary 101 (H) 65 - 99 mg/dL  Glucose, capillary     Status: None   Collection Time: 04/19/17  7:41 PM  Result Value Ref Range   Glucose-Capillary 94 65 - 99 mg/dL  Glucose, capillary     Status: None   Collection Time: 04/19/17 11:34 PM  Result Value Ref Range   Glucose-Capillary 95 65 - 99 mg/dL  Glucose, capillary     Status: None   Collection Time: 04/20/17  3:38 AM  Result Value Ref Range   Glucose-Capillary 95 65 - 99 mg/dL  Glucose, capillary     Status: None   Collection Time: 04/20/17  8:25 AM  Result Value Ref Range   Glucose-Capillary 89 65 - 99 mg/dL      ImagingResults(Last48hours)  Dg Chest Port 1 View  Result Date: 04/19/2017 CLINICAL DATA:  Status post CVA, seizure; history of brain malignancy. EXAM: PORTABLE CHEST 1 VIEW COMPARISON:  Chest x-ray of April 18, 2017 FINDINGS: There has been interval extubation of the trachea and esophagus. The lungs are reasonably well expanded and clear. The heart and pulmonary vascularity are normal. There is no pleural effusion. A ventriculoperitoneal shunt tube is present to the left of midline. IMPRESSION: No active cardiopulmonary disease. Interval extubation of the trachea and esophagus. Electronically Signed   By: David  Martinique M.D.   On: 04/19/2017 09:36   Dg Swallowing Func-speech Pathology  Result Date: 04/19/2017 Objective Swallowing Evaluation: Type of Study: MBS-Modified Barium Swallow Study  Patient Details Name: MOUHAMAD TEED MRN: [id number] Date of Birth: 04/09/1970 Today's Date: 04/19/2017  Time: SLP Start Time (ACUTE ONLY): 2094 -SLP Stop Time (ACUTE ONLY): 1425 SLP Time Calculation (min) (ACUTE ONLY): 30 min Past Medical History: Past Medical History: Diagnosis Date . Brain cancer ([place])  . Hypercholesteremia  . Seizure ([place])  . Stroke ([place])  . Thyroid disorder  Past Surgical History: Past Surgical History: Procedure Laterality Date . APPENDECTOMY    1980s . BRAIN SURGERY    1993 . CRANIOTOMY Right 04/09/2017  Procedure: CRANIOTOMY HEMATOMA EVACUATION SUBDURAL;  Surgeon: Erline Levine, MD;  Location: [place];  Service: Neurosurgery;  Laterality: Right; . DIRECT LARYNGOSCOPY N/A 04/09/2017  Procedure: DIRECT LARYNGOSCOPY;  Surgeon: Erline Levine, MD;  Location: [place];  Service: Neurosurgery;  Laterality: N/A; . DIRECT LARYNGOSCOPY N/A 04/09/2017  Procedure: DIRECT LARYNGOSCOPY;  Surgeon: [name], MD;  Location: [place];  Service: ENT;  Laterality: N/A; . DIRECT LARYNGOSCOPY N/A 04/18/2017  Procedure: DIRECT LARYNGOSCOPY;  Surgeon: [name], MD;  Location: [place];  Service: ENT;  Laterality: N/A; . 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 Intubated 3/24-28; reintubated 3/28 due to stridor, notes indicate difficult intubation.  Extubated 4/2. ENT direct laryngoscopy 4/2 revealed: "inflammatory changes of the posterior glottis as expected, small nodular granulation at the anterior tracheal wall at the site of the previous tracheostomy and some curvature to the right of the trachea.  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, since his diagnosis of autoimmune cerebritis, he has significant decline in his functional status, with rehabilitation, he was eventually able to ambulate with walker, still with very unsteady ataxic gait, loss of hearing, slurred speech, difficulty reading, he had regained significant recovery from 2016, was able to begin to read his Bible again  Subjective: alert, Assessment / Plan / Recommendation CHL IP CLINICAL IMPRESSIONS 04/19/2017 Clinical Impression Pt presents with a primary oral dysphagia marked by impaired oral motor planning with decreased initiation of lip seal, decreased coordination of bolus control, leading to diffuse spillage of material within oral cavity.  There is delayed preparation of solid materials for swallow.  Once bolus material reaches pharynx, a swallow is triggered with reliable/consistent airway protection, adequate pharyngeal contraction, no residue post-swallow.  There were no incidents of penetration nor aspiration. Recommend starting a dysphagia 1 diet with thin liquids; meds whole in puree.  Encourage self-feeding as much as able with 1:1 assist as needed.  SLP will follow for safety/diet progression.  Please order speech/language evaluation.   SLP Visit Diagnosis Dysphagia, oral phase (R13.11) Attention and concentration deficit following -- Frontal lobe and executive function deficit following -- Impact on safety and function Mild aspiration risk   CHL IP TREATMENT RECOMMENDATION 04/19/2017 Treatment Recommendations Therapy as outlined in treatment plan below   No flowsheet data found. CHL IP DIET RECOMMENDATION 04/19/2017 SLP Diet Recommendations Dysphagia 1 (Puree) solids;Thin liquid Liquid Administration via Cup Medication Administration Whole meds with puree Compensations Small  sips/bites;Minimize environmental distractions Postural Changes --   CHL IP OTHER RECOMMENDATIONS 04/19/2017 Recommended Consults -- Oral Care Recommendations Oral care BID Other Recommendations --   CHL IP FOLLOW UP RECOMMENDATIONS 04/19/2017 Follow up Recommendations Other (comment)   CHL IP FREQUENCY AND DURATION 04/19/2017 Speech Therapy Frequency (ACUTE ONLY) min 2x/week Treatment Duration 2 weeks      CHL IP ORAL PHASE 04/19/2017 Oral Phase Impaired Oral - Pudding Teaspoon -- Oral - Pudding Cup -- Oral - Honey Teaspoon -- Oral - Honey Cup -- Oral - Nectar Teaspoon -- Oral - Nectar Cup -- Oral - Nectar Straw -- Oral - Thin Teaspoon Left anterior bolus loss;Right anterior bolus loss;Incomplete tongue to palate contact;Impaired mastication;Reduced posterior propulsion;Holding of bolus;Decreased bolus cohesion Oral - Thin Cup Left anterior bolus loss;Right anterior bolus loss;Incomplete tongue to palate contact;Impaired mastication;Reduced posterior propulsion;Holding of bolus;Decreased bolus cohesion Oral - Thin Straw -- Oral - Puree Left anterior bolus loss;Right anterior bolus loss;Incomplete tongue to palate contact;Impaired mastication;Reduced posterior propulsion;Holding of bolus;Delayed oral transit;Decreased  bolus cohesion Oral - Mech Soft Left anterior bolus loss;Right anterior bolus loss;Incomplete tongue to palate contact;Impaired mastication;Reduced posterior propulsion;Holding of bolus;Decreased bolus cohesion;Delayed oral transit Oral - Regular -- Oral - Multi-Consistency -- Oral - Pill -- Oral Phase - Comment --  CHL IP PHARYNGEAL PHASE 04/19/2017 Pharyngeal Phase Impaired Pharyngeal- Pudding Teaspoon -- Pharyngeal -- Pharyngeal- Pudding Cup -- Pharyngeal -- Pharyngeal- Honey Teaspoon -- Pharyngeal -- Pharyngeal- Honey Cup -- Pharyngeal -- Pharyngeal- Nectar Teaspoon -- Pharyngeal -- Pharyngeal- Nectar Cup -- Pharyngeal -- Pharyngeal- Nectar Straw -- Pharyngeal -- Pharyngeal- Thin Teaspoon Delayed swallow  initiation-pyriform sinuses Pharyngeal -- Pharyngeal- Thin Cup Delayed swallow initiation-pyriform sinuses Pharyngeal -- Pharyngeal- Thin Straw -- Pharyngeal -- Pharyngeal- Puree -- Pharyngeal -- Pharyngeal- Mechanical Soft -- Pharyngeal -- Pharyngeal- Regular -- Pharyngeal -- Pharyngeal- Multi-consistency -- Pharyngeal -- Pharyngeal- Pill -- Pharyngeal -- Pharyngeal Comment --  No flowsheet data found. No flowsheet data found. Juan Quam Laurice 04/19/2017, 2:57 PM                Assessment/Plan: Diagnosis: Right SDH after fall, hx of glioblastoma, seizure disorder 1. Does the need for close, 24 hr/day medical supervision in concert with the patient's rehab needs make it unreasonable for this patient to be served in a less intensive setting? Potentially 2. Co-Morbidities requiring supervision/potential complications: multiple issues noted above related to his glioblastoma 3. Due to bladder management, bowel management, safety, skin/wound care, disease management, medication administration, pain management and patient education, does the patient require 24 hr/day rehab nursing? Yes 4. Does the patient require coordinated care of a physician, rehab nurse, PT (1-2 hrs/day, 5 days/week), OT (1-2 hrs/day, 5 days/week) and SLP (1-2 hrs/day, 5 days/week) to address physical and functional deficits in the context of the above medical diagnosis(es)? Yes/potentially Addressing deficits in the following areas: balance, endurance, locomotion, strength, transferring, bowel/bladder control, bathing, dressing, feeding, grooming, toileting, cognition, speech and language 5. Can the patient actively participate in an intensive therapy program of at least 3 hrs of therapy per day at least 5 days per week? Potentially 6. The potential for patient to make measurable gains while on inpatient rehab is good and fair 7. Anticipated functional outcomes upon discharge from inpatient rehab are min assist and mod assist  with  PT, min assist and mod assist with OT, min assist and mod assist with SLP. 8. Estimated rehab length of stay to reach the above functional goals is: 20+ days 9. Anticipated D/C setting: Home 10. Anticipated post D/C treatments: [place] therapy 11. Overall Rehab/Functional Prognosis: fair  RECOMMENDATIONS: This patient's condition is appropriate for continued rehabilitative care in the following setting: To be determined Patient has agreed to participate in recommended program. N/A Note that insurance prior authorization may be required for reimbursement for recommended care.  Comment: Will need to see consistent ability to participate in therapy before considering inpatient rehab.   Meredith Staggers, MD, [place] [facility] 04/20/2017    Lavon Paganini Angiulli, PA-C 04/20/2017          Revision History                        Routing History

## 2017-04-25 ENCOUNTER — Inpatient Hospital Stay (HOSPITAL_COMMUNITY): Payer: Medicare Other | Admitting: Speech Pathology

## 2017-04-25 ENCOUNTER — Inpatient Hospital Stay (HOSPITAL_COMMUNITY): Payer: Medicare Other | Admitting: Physical Therapy

## 2017-04-25 ENCOUNTER — Encounter (HOSPITAL_COMMUNITY): Payer: Self-pay | Admitting: *Deleted

## 2017-04-25 ENCOUNTER — Inpatient Hospital Stay (HOSPITAL_COMMUNITY): Payer: Medicare Other | Admitting: Occupational Therapy

## 2017-04-25 DIAGNOSIS — S065X3S Traumatic subdural hemorrhage with loss of consciousness of 1 hour to 5 hours 59 minutes, sequela: Secondary | ICD-10-CM

## 2017-04-25 DIAGNOSIS — R1312 Dysphagia, oropharyngeal phase: Secondary | ICD-10-CM

## 2017-04-25 LAB — CBC WITH DIFFERENTIAL/PLATELET
Basophils Absolute: 0 10*3/uL (ref 0.0–0.1)
Basophils Relative: 0 %
EOS PCT: 2 %
Eosinophils Absolute: 0.2 10*3/uL (ref 0.0–0.7)
HEMATOCRIT: 39 % (ref 39.0–52.0)
Hemoglobin: 13.1 g/dL (ref 13.0–17.0)
LYMPHS ABS: 2.8 10*3/uL (ref 0.7–4.0)
LYMPHS PCT: 27 %
MCH: 29.4 pg (ref 26.0–34.0)
MCHC: 33.6 g/dL (ref 30.0–36.0)
MCV: 87.6 fL (ref 78.0–100.0)
MONO ABS: 0.8 10*3/uL (ref 0.1–1.0)
Monocytes Relative: 8 %
NEUTROS ABS: 6.5 10*3/uL (ref 1.7–7.7)
Neutrophils Relative %: 63 %
PLATELETS: 306 10*3/uL (ref 150–400)
RBC: 4.45 MIL/uL (ref 4.22–5.81)
RDW: 13.8 % (ref 11.5–15.5)
WBC: 10.3 10*3/uL (ref 4.0–10.5)

## 2017-04-25 LAB — COMPREHENSIVE METABOLIC PANEL
ALT: 22 U/L (ref 17–63)
AST: 19 U/L (ref 15–41)
Albumin: 3 g/dL — ABNORMAL LOW (ref 3.5–5.0)
Alkaline Phosphatase: 54 U/L (ref 38–126)
Anion gap: 8 (ref 5–15)
BILIRUBIN TOTAL: 0.6 mg/dL (ref 0.3–1.2)
BUN: 11 mg/dL (ref 6–20)
CHLORIDE: 96 mmol/L — AB (ref 101–111)
CO2: 29 mmol/L (ref 22–32)
CREATININE: 1.12 mg/dL (ref 0.61–1.24)
Calcium: 8.6 mg/dL — ABNORMAL LOW (ref 8.9–10.3)
Glucose, Bld: 84 mg/dL (ref 65–99)
POTASSIUM: 3.7 mmol/L (ref 3.5–5.1)
Sodium: 133 mmol/L — ABNORMAL LOW (ref 135–145)
TOTAL PROTEIN: 5.7 g/dL — AB (ref 6.5–8.1)

## 2017-04-25 NOTE — Progress Notes (Signed)
[place] PHYSICAL MEDICINE & REHABILITATION     PROGRESS NOTE    Subjective/Complaints: No new issues overnight. Pt alert sitting in bed ROS: Limited due to cognitive/behavioral     Objective: Vital Signs: Blood pressure 116/80, pulse 87, temperature 99 F (37.2 C), temperature source Oral, resp. rate 19, weight 83.1 kg (183 lb 3.2 oz), SpO2 98 %. No results found. Recent Labs    04/25/17 0626  WBC 10.3  HGB 13.1  HCT 39.0  PLT 306   Recent Labs    04/25/17 0626  NA 133*  K 3.7  CL 96*  GLUCOSE 84  BUN 11  CREATININE 1.12  CALCIUM 8.6*   CBG (last 3)  Recent Labs    04/24/17 0314 04/24/17 0712 04/24/17 1141  GLUCAP 80 92 103*    Wt Readings from Last 3 Encounters:  04/25/17 83.1 kg (183 lb 3.2 oz)  04/24/17 86.1 kg (189 lb 13.1 oz)  02/13/17 91.2 kg (201 lb)    Physical Exam:    Constitutional: No distress . Vital signs reviewed. Irregularities on scalp HEENT: EOMI, oral membranes moist Cardiovascular: RRR without murmur. No JVD    Respiratory: CTA Bilaterally without wheezes or rales. Normal effort    GI: BS +, non-tender, non-distended  Skin. Warm and dry. Craniotomy sites clean and dry Neurological. alert, makes eye contact. Ongoing delays in processing  Psych: flat    Assessment/Plan: 1. Functional and cognitive deficits secondary to SDH which require 3+ hours per day of interdisciplinary therapy in a comprehensive inpatient rehab setting. Physiatrist is providing close team supervision and 24 hour management of active medical problems listed below. Physiatrist and rehab team continue to assess barriers to discharge/monitor patient progress toward functional and medical goals.  Function:  Bathing Bathing position      Bathing parts      Bathing assist        Upper Body Dressing/Undressing Upper body dressing                    Upper body assist        Lower Body Dressing/Undressing Lower body dressing                                   Lower body assist        Toileting Toileting          Toileting assist     Transfers Chair/bed transfer             Locomotion Ambulation           Wheelchair          Cognition Comprehension Comprehension assist level: Understands basic less than 25% of the time/ requires cueing >75% of the time  Expression Expression assist level: Expresses basis less than 25% of the time/requires cueing >75% of the time.  Social Interaction Social Interaction assist level: Interacts appropriately less than 25% of the time. May be withdrawn or combative.  Problem Solving Problem solving assist level: Solves basic less than 25% of the time - needs direction nearly all the time or does not effectively solve problems and may need a restraint for safety  Memory Memory assist level: Recognizes or recalls less than 25% of the time/requires cueing greater than 75% of the time   Medical Problem List and Plan: 1.Decreased functional mobilitysecondary to right subdural hematoma after fall complicated by acute hypoxic respiratory failure  and stridor as well as history of glioblastoma and seizure disorder -beginning therapies today. Significant cognitive delay although he is more alert 2. DVT Prophylaxis/Anticoagulation/history of heparin-induced thrombocytopenia: SCDs. Chronic Arixtra currently on hold due to hematoma 3. Pain Management:Tylenol as needed 4. Mood:Prozac 20 mg daily 5. Neuropsych: This patientiscapable of making decisions on hisown behalf. 6. Skin/Wound Care:Routine skin checks 7. Fluids/Electrolytes/Nutrition:encourage PO  -I personally reviewed the patient's labs today.   8.Seizure disorder. Vimpat 200 mg twice daily, Keppra 1500 mg twice daily. Follow-up neurology services Dr. Krista Blue as outpatient 9.Dysphagia. Dysphagia #1 thin liquids. Follow-up speech therapy -full supervision with  meals 10.Hypothyroidism. Synthroid 11.Hyperlipidemia. Lipitor 12.Leukocytosis. Felt to be reactive from recent Decadron---down to 10.3 today 4/9 13.MRSA PCR screening positive. Maintain contact precautions 14.Constipation. Laxative assistance   LOS (Days) [place] EVALUATION WAS PERFORMED  Meredith Staggers, MD 04/25/2017 8:44 AM

## 2017-04-25 NOTE — Evaluation (Signed)
Speech Language Pathology Assessment and Plan  Patient Details  Name: Glen [name] MRN: [id number] Date of Birth: [birth year]  SLP Diagnosis: Dysphagia;Cognitive Impairments  Rehab Potential: Fair ELOS: 28 days     Today's Date: 04/25/2017 SLP Individual Time: 0810-0905 SLP Individual Time Calculation (min): 55 min   Problem List:  Patient Active Problem List   Diagnosis Date Noted  . Traumatic subdural hematoma ([place]) 04/24/2017  . Abnormal movements   . Subdural hematoma ([place]) 04/09/2017  . Seizures ([place]) 02/13/2017   Past Medical History:  Past Medical History:  Diagnosis Date  . Brain cancer ([place])   . Hypercholesteremia   . Seizure ([place])   . Stroke ([place])   . Thyroid disorder    Past Surgical History:  Past Surgical History:  Procedure Laterality Date  . APPENDECTOMY     1980s  . BRAIN SURGERY     1993  . CRANIOTOMY Right 04/09/2017   Procedure: CRANIOTOMY HEMATOMA EVACUATION SUBDURAL;  Surgeon: Erline Levine, MD;  Location: [facility];  Service: Neurosurgery;  Laterality: Right;  . DIRECT LARYNGOSCOPY N/A 04/09/2017   Procedure: DIRECT LARYNGOSCOPY;  Surgeon: Erline Levine, MD;  Location: [facility];  Service: Neurosurgery;  Laterality: N/A;  . DIRECT LARYNGOSCOPY N/A 04/09/2017   Procedure: DIRECT LARYNGOSCOPY;  Surgeon: [name], MD;  Location: [facility];  Service: ENT;  Laterality: N/A;  . DIRECT LARYNGOSCOPY N/A 04/18/2017   Procedure: DIRECT LARYNGOSCOPY;  Surgeon: [name], MD;  Location: [facility];  Service: ENT;  Laterality: N/A;  . TRACHEOSTOMY TUBE PLACEMENT N/A 04/18/2017   Procedure: TRANSNASAL FIBER OPTIC LARYNGOSCOPY;  Surgeon: [name], MD;  Location: [facility];  Service: ENT;  Laterality: N/A;    Assessment / Plan / Recommendation Clinical Impression   Mr. Glen [name] is a 47 year old right-handed male with history of glioblastoma status post surgical removal tumor/VP shunt in 1993 by Dr. 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 Krista Blue. Presented 04/09/2017 upon transfer from [facility] with altered mental status times 2 days as well as reports of recent falls. Per chart review uses a walker was able to speak and interact with family. There was initial concern for possible sepsis with noted low-grade fever elevated WBC and treated with Rocephin while at [facility]. Cranial CT scan from outside hospital showed large subdural hematoma on the right. Required intubation with difficult airway noted inspiratory stridor requiring intubation by ENT prior to going to the OR where he underwent craniotomy hematoma evacuation 04/10/2017 by Dr. [name]. Hospital course noted seizures with EEG showing generalized cerebral dysfunction follow-up per neurology services maintained on Keppra as well as adjustments in the addition of Vimpat. Patient slowly weaned from ventilator. Later underwent transnasal fiberoptic laryngoscopy 04/18/2017 per Dr. [name] after initial stridor and follow-up after extubation. Bouts of leukocytosis felt to be reactive due to steroids. MRSA PCR screen positive with contact precautions. Dysphagia #1 thin liquid diet. Physical and occupational therapy ongoing patient was admitted for a comprehensive rehab program.  SLP evaluation was completed on 04/25/2017 with the following results:  Pt presents with s/s of a cognitively based oral dysphagia with prolonged oral phase resulting from decreased attention to boluses.  Subjectively, swallow response appears delayed; however, pt had no overt s/s of aspiration with purees or thin liquids.  As a result, recommend that pt remain on currently prescribed diet with full supervision for use of swallowing precautions.   Additionally, pt presents with severe  cognitive-linguistic deficits.  Pt demonstrates decreased task initiation and decreased focused attention to tasks which impacted all higher  level cognitive processes.  Pt had difficulty following 1 step commands and exhibited very little initiation of functional communication.  It is difficult to determine whether these deficits are indicative of a primary language deficit or are secondary to cognitive dysfunction and significant hearing impairment.   Given the abovementioned deficits, pt would benefit from skilled ST whiel inpatient in order to maximize functional independence and reduce burden of care prior to discharge.  Anticipate that pt will need 24/7 supervision at discharge in addition to [facility] follow up at unext level of care.      Skilled Therapeutic Interventions          Cognitive-linguistic and bedside swallow evaluation completed.  Due to the abovementioned deficits, pt needed total assist to complete basic, functional tasks.  Pt was left in bed with bed alarm set and call bell within reach.      SLP Assessment  Patient will need skilled Speech Lanaguage Pathology Services during CIR admission    Recommendations  SLP Diet Recommendations: Dysphagia 1 (Puree);Thin Liquid Administration via: Cup Medication Administration: Crushed with puree Supervision: Staff to assist with self feeding;Full supervision/cueing for compensatory strategies Compensations: Small sips/bites;Minimize environmental distractions Postural Changes and/or Swallow Maneuvers: Seated upright 90 degrees Oral Care Recommendations: Oral care BID Patient destination: Home Follow up Recommendations: Home Health SLP;Outpatient SLP;24 hour supervision/assistance Equipment Recommended: None recommended by SLP    SLP Frequency 3 to 5 out of 7 days   SLP Duration  SLP Intensity  SLP Treatment/Interventions 28 days   Minumum of 1-2 x/day, 30 to 90 minutes  Cognitive remediation/compensation;Cueing hierarchy;Dysphagia/aspiration precaution training;Functional tasks;Patient/family education;Multimodal communication approach;Internal/external  aids;Environmental controls    Pain Pain Assessment Pain Scale: 0-10 Pain Score: 0-No pain Faces Pain Scale: No hurt  Prior Functioning Cognitive/Linguistic Baseline: Baseline deficits Baseline deficit details: Per chart review:  loss of hearing, slurred speech, difficulty reading Type of Home: Apartment  Lives With: Family Available Help at Discharge: Family;Available 24 hours/day Vocation: On disability  Function:  Eating Eating   Modified Consistency Diet: Yes Eating Assist Level: Hand over hand assist           Cognition Comprehension Comprehension assist level: Understands basic 25 - 49% of the time/ requires cueing 50 - 75% of the time  Expression   Expression assist level: Expresses basis less than 25% of the time/requires cueing >75% of the time.  Social Interaction Social Interaction assist level: Interacts appropriately less than 25% of the time. May be withdrawn or combative.  Problem Solving Problem solving assist level: Solves basic less than 25% of the time - needs direction nearly all the time or does not effectively solve problems and may need a restraint for safety  Memory Memory assist level: Recognizes or recalls less than 25% of the time/requires cueing greater than 75% of the time   Short Term Goals: Week 1: SLP Short Term Goal 1 (Week 1): Pt will consume dys 1 textures and thin liquids with max assist for use of swallowing precautions and minimal overt s/s of aspiration over 3 consecutive sessions.  SLP Short Term Goal 2 (Week 1): Pt will complete basic, familiar tasks with max assist multimodal cues for task initiation and sequencing.   SLP Short Term Goal 3 (Week 1): Pt will locate items to the left of midline in 50% of opportunities during basic, familiar tasks with max assist multimodal cues.  SLP Short Term Goal 4 (Week 1): Pt will focus his attention to stimulation in 50% of opportunities with max assist multimodal cues.   SLP Short Term Goal 5  (Week 1): Pt will verbalize at the word level to convey needs and wants to caregivers with max assist multimodal cues.    Refer to Care Plan for Long Term Goals  Recommendations for other services: None   Discharge Criteria: Patient will be discharged from SLP if patient refuses treatment 3 consecutive times without medical reason, if treatment goals not met, if there is a change in medical status, if patient makes no progress towards goals or if patient is discharged from hospital.  The above assessment, treatment plan, treatment alternatives and goals were discussed and mutually agreed upon: No family available/patient unable  Windell Moulding L 04/25/2017, 11:26 AM

## 2017-04-25 NOTE — Progress Notes (Addendum)
[facility] Individual Statement of Services  Patient Name:  Glen [name]  Date:  04/25/2017  Welcome to the [facility].  Our goal is to provide you with an individualized program based on your diagnosis and situation, designed to meet your specific needs.  With this comprehensive rehabilitation program, you will be expected to participate in at least 3 hours of rehabilitation therapies Monday-Friday, with modified therapy programming on the weekends.  Your rehabilitation program will include the following services:  Physical Therapy (PT), Occupational Therapy (OT), Speech Therapy (ST), 24 hour per day rehabilitation nursing, Neuropsychology, Case Management (Social Worker), Rehabilitation Medicine, Nutrition Services and Pharmacy Services  Weekly team conferences will be held on Tuesdays to discuss your progress.  Your Social Worker will talk with you frequently to get your input and to update you on team discussions.  Team conferences with you and your family in attendance may also be held.  Expected length of stay:  3 weeks  Overall anticipated outcome:  Supervision with cueing and minimal assistance for stairs  Depending on your progress and recovery, your program may change. Your Social Worker will coordinate services and will keep you informed of any changes. Your Social Worker's name and contact numbers are listed  below.  The following services may also be recommended but are not provided by the [facility] will be made to provide these services after discharge if needed.  Arrangements include referral to agencies that provide these services.  Your insurance has been verified to be:  Medicare and Medicaid Your primary doctor is:  [name], PA  Pertinent information will be shared with your doctor and  your insurance company.  Social Worker:  Alfonse Alpers, LCSW  [phone] or [phone]  Information discussed with and copy given to patient by: Trey Sailors, 04/25/2017, 1:39 PM

## 2017-04-25 NOTE — Evaluation (Signed)
Physical Therapy Assessment and Plan  Patient Details  Name: Glen [name] MRN: [id number] Date of Birth: [birth year]  PT Diagnosis: Abnormality of gait, Cognitive deficits, Difficulty walking, Impaired cognition and Muscle weakness Rehab Potential: Good ELOS: 21-24 days   Today's Date: 04/25/2017 PT Individual Time: 0930-1027 PT Individual Time Calculation (min): 57 min    Problem List:  Patient Active Problem List   Diagnosis Date Noted  . Traumatic subdural hematoma ([place]) 04/24/2017  . Abnormal movements   . Subdural hematoma ([name]) 04/09/2017  . Seizures ([name]) 02/13/2017    Past Medical History:  Past Medical History:  Diagnosis Date  . Brain cancer ([name])   . Hypercholesteremia   . Seizure ([name])   . Stroke ([name])   . Thyroid disorder    Past Surgical History:  Past Surgical History:  Procedure Laterality Date  . APPENDECTOMY     1980s  . BRAIN SURGERY     1993  . CRANIOTOMY Right 04/09/2017   Procedure: CRANIOTOMY HEMATOMA EVACUATION SUBDURAL;  Surgeon: Erline Levine, MD;  Location: [facility];  Service: Neurosurgery;  Laterality: Right;  . DIRECT LARYNGOSCOPY N/A 04/09/2017   Procedure: DIRECT LARYNGOSCOPY;  Surgeon: Erline Levine, MD;  Location: [facility];  Service: Neurosurgery;  Laterality: N/A;  . DIRECT LARYNGOSCOPY N/A 04/09/2017   Procedure: DIRECT LARYNGOSCOPY;  Surgeon: [name], MD;  Location: [facility];  Service: ENT;  Laterality: N/A;  . DIRECT LARYNGOSCOPY N/A 04/18/2017   Procedure: DIRECT LARYNGOSCOPY;  Surgeon: [name], MD;  Location: [facility];  Service: ENT;  Laterality: N/A;  . TRACHEOSTOMY TUBE PLACEMENT N/A 04/18/2017   Procedure: TRANSNASAL FIBER OPTIC LARYNGOSCOPY;  Surgeon: [name], MD;  Location: [facility];  Service: ENT;  Laterality: N/A;    Assessment & Plan Clinical Impression: Patient is a 47 y.o. year old male with recent admission to the hospital on  04/09/2017 upon transfer from [facility] with altered mental status times 2 days as well  as reports of recent falls. Per chart review uses a walker was able to speak and interact with family. There was initial concern for possible sepsis with noted low-grade fever elevated WBC and treated with Rocephin while at [facility]. Cranial CT scan from outside hospital showed large subdural hematoma on the right. Required intubation with difficult airway noted inspiratory stridor requiring intubation by ENT prior to going to the OR where he underwent craniotomy hematoma evacuation 04/10/2017 by Dr. [name]. Hospital course noted seizures with EEG showing generalized cerebral dysfunction follow-up per neurology services maintained on Keppra as well as adjustments in the addition of Vimpat. Patient slowly weaned from ventilator. Later underwent transnasal fiberoptic laryngoscopy 04/18/2017 per Dr. [name] after initial stridor and follow-up after extubation. Bouts of leukocytosis felt to be reactive due to steroids. MRSA PCR screen positive with contact precautions. Dysphagia #1 thin liquid diet.  Patient transferred to CIR on 04/24/2017 .   Patient currently requires mod with mobility secondary to muscle weakness, motor apraxia, decreased coordination and decreased motor planning, decreased initiation, decreased attention, decreased awareness, decreased problem solving, decreased safety awareness, decreased memory and delayed processing and decreased standing balance and decreased balance strategies.  Prior to hospitalization, patient was modified independent  with mobility and lived with Family in a [place] home.  Home access is  Ramped entrance.  Patient will benefit from skilled PT intervention to maximize safe functional mobility, minimize fall risk and decrease caregiver burden for planned discharge home with 24 hour supervision.  Anticipate patient will benefit from follow  up [place] at discharge.  PT - End of Session Activity Tolerance: Tolerates 30+ min activity with multiple rests Endurance  Deficit: Yes PT Assessment Rehab Potential (ACUTE/IP ONLY): Good PT Barriers to Discharge: Decreased caregiver support PT Patient demonstrates impairments in the following area(s): Balance;Behavior;Endurance;Motor;Pain;Sensory PT Transfers Functional Problem(s): Bed Mobility;Bed to Chair;Car;Furniture PT Locomotion Functional Problem(s): Stairs;Ambulation;Wheelchair Mobility PT Plan PT Intensity: Minimum of 1-2 x/day ,45 to 90 minutes PT Frequency: 5 out of 7 days PT Duration Estimated Length of Stay: 21-24 days PT Treatment/Interventions: Ambulation/gait training;Pain management;Stair training;Balance/vestibular training;DME/adaptive equipment instruction;Patient/family education;Therapeutic Activities;Wheelchair propulsion/positioning;Cognitive remediation/compensation;Therapeutic Exercise;Community reintegration;Functional mobility training;UE/LE Strength taining/ROM;Discharge planning;Neuromuscular re-education;Splinting/orthotics;UE/LE Coordination activities PT Transfers Anticipated Outcome(s): supervision PT Locomotion Anticipated Outcome(s): supervision PT Recommendation Follow Up Recommendations: Home health PT Patient destination: Home Equipment Recommended: To be determined  Skilled Therapeutic Intervention Pt participated in skilled PT eval. Pt unable to provide history, no family present.  Pt requires max cuing for initiation of all tasks, pt demos motor apraxia, impaired coordination, impaired visual scanning, decreased focused attention and awareness. Pt performs stand pivot transfers with mod A, max cuing.  Sit to stand and gait up to 30' with RW with min/mod A, pt limited by decreased visual scanning requiring max cues to steer walker, min/mod A for side stepping and backing up to chair.  Pt performs stair negotiation x 8 stairs with min A, cues for safety and sequencing.  Simulated car transfer with max cues for safety, min A for steadying and balance. Pt chooses to lay down in  the back seat of car, unable to problem solve sitting in car in usual position.  Attempt w/c mobility with pt, pt unable to sequence and problem solve despite max multi modal cues.  Gait into bathroom and toilet transfer with min/mod A, max cuing, total A for hygiene and clothing management.  Pt left in w/c in room with quick release belt in place, needs at hand, RN present.   PT Evaluation Precautions/Restrictions Precautions Precautions: Fall Restrictions Weight Bearing Restrictions: No Pain Pain Assessment Pain Scale: Faces Pain Score: 0-No pain Faces Pain Scale: No hurt Home Living/Prior Functioning Home Living Available Help at Discharge: Family;Available 24 hours/day Type of Home: Apartment Home Access: Ramped entrance Home Layout: One level  Lives With: Family Prior Function Driving: No  Cognition Overall Cognitive Status: Impaired/Different from baseline Arousal/Alertness: Awake/alert Orientation Level: Oriented to person Attention: Focused Focused Attention: Impaired Focused Attention Impairment: Functional basic;Verbal basic Awareness: Impaired Awareness Impairment: Intellectual impairment Executive Function: Initiating Initiating: Impaired Initiating Impairment: Functional basic;Verbal basic Safety/Judgment: Impaired Sensation Sensation Light Touch: Impaired by gross assessment Proprioception: Impaired by gross assessment Coordination Gross Motor Movements are Fluid and Coordinated: No Fine Motor Movements are Fluid and Coordinated: No Motor  Motor Motor: Motor apraxia;Abnormal postural alignment and control   Trunk/Postural Assessment  Cervical Assessment Cervical Assessment: (fwd head) Thoracic Assessment Thoracic Assessment: Within Functional Limits Lumbar Assessment Lumbar Assessment: (posterior pelvic tilt) Postural Control Postural Control: Deficits on evaluation Righting Reactions: delayed  Balance Static Standing Balance Static Standing -  Comment/# of Minutes: min/mod A  Dynamic Standing Balance Dynamic Standing - Comments: min/mod A with RW, max A without AD Extremity Assessment      RLE Assessment RLE Assessment: (grossly 3/5) LLE Assessment LLE Assessment: (grossly 3/5)   See Function Navigator for Current Functional Status.   Refer to Care Plan for Long Term Goals  Recommendations for other services: None   Discharge Criteria: Patient will be discharged from PT if patient refuses treatment 3  consecutive times without medical reason, if treatment goals not met, if there is a change in medical status, if patient makes no progress towards goals or if patient is discharged from hospital.  The above assessment, treatment plan, treatment alternatives and goals were discussed and mutually agreed upon: by patient  DONAWERTH,KAREN 04/25/2017, 10:38 AM

## 2017-04-25 NOTE — Evaluation (Signed)
Occupational Therapy Assessment and Plan  Patient Details  Name: Glen [name] MRN: [id number] Date of Birth: 12/01/1970  OT Diagnosis: abnormal posture, apraxia, cognitive deficits, muscle weakness (generalized) and coordination disorder Rehab Potential: Rehab Potential (ACUTE ONLY): Fair ELOS: 21-24 days   Today's Date: 04/25/2017 OT Individual Time: 1045-1155 OT Individual Time Calculation (min): 70 min     Problem List:  Patient Active Problem List   Diagnosis Date Noted  . Traumatic subdural hematoma (HCC) 04/24/2017  . Abnormal movements   . Subdural hematoma (HCC) 04/09/2017  . Seizures (HCC) 02/13/2017    Past Medical History:  Past Medical History:  Diagnosis Date  . Brain cancer (HCC)   . Hypercholesteremia   . Seizure (HCC)   . Stroke (HCC)   . Thyroid disorder    Past Surgical History:  Past Surgical History:  Procedure Laterality Date  . APPENDECTOMY     1980s  . BRAIN SURGERY     1993  . CRANIOTOMY Right 04/09/2017   Procedure: CRANIOTOMY HEMATOMA EVACUATION SUBDURAL;  Surgeon: Stern, Joseph, MD;  Location: [facility] OR;  Service: Neurosurgery;  Laterality: Right;  . DIRECT LARYNGOSCOPY N/A 04/09/2017   Procedure: DIRECT LARYNGOSCOPY;  Surgeon: Stern, Joseph, MD;  Location: [facility] OR;  Service: Neurosurgery;  Laterality: N/A;  . DIRECT LARYNGOSCOPY N/A 04/09/2017   Procedure: DIRECT LARYNGOSCOPY;  Surgeon: Bates, Dwight, MD;  Location: [facility] OR;  Service: ENT;  Laterality: N/A;  . DIRECT LARYNGOSCOPY N/A 04/18/2017   Procedure: DIRECT LARYNGOSCOPY;  Surgeon: Bates, Dwight, MD;  Location: [facility] OR;  Service: ENT;  Laterality: N/A;  . TRACHEOSTOMY TUBE PLACEMENT N/A 04/18/2017   Procedure: TRANSNASAL FIBER OPTIC LARYNGOSCOPY;  Surgeon: Bates, Dwight, MD;  Location: [facility] OR;  Service: ENT;  Laterality: N/A;    Assessment & Plan Clinical Impression: Patient is a 47 y.o. year old male with history of glioblastoma status post surgical removal tumor/VP shunt in 1993 by Dr. 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 Yan. Presented 04/09/2017 upon transfer from [facility] with altered mental status times 2 days as well as reports of recent falls. Per chart review uses a walker was able to speak and interact with family. There was initial concern for possible sepsis with noted low-grade fever elevated WBC and treated with Rocephin while at [facility]. Cranial CT scan from outside hospital showed large subdural hematoma on the right. Required intubation with difficult airway noted inspiratory stridor requiring intubation by ENT prior to going to the OR where he underwent craniotomy hematoma evacuation 04/10/2017 by Dr. Stern. Hospital course noted seizures with EEG showing generalized cerebral dysfunction follow-up per neurology services maintained on Keppra as well as adjustments in the addition of Vimpat. Patient slowly weaned from ventilator. Later underwent transnasal fiberoptic laryngoscopy 04/18/2017 per Dr. Bates after initial stridor and follow-up after extubation. Bouts of leukocytosis felt to be reactive due to steroids. MRSA PCR screen positive with contact precautions. Dysphagia #1 thin liquid diet.Patient transferred to CIR on 04/24/2017 .    Patient currently requires max with basic self-care skills secondary to muscle weakness, decreased cardiorespiratoy endurance, motor apraxia, ataxia, decreased coordination and decreased motor planning, decreased initiation, decreased attention, decreased awareness, decreased problem solving, decreased safety awareness and decreased memory and decreased sitting balance, decreased standing balance and decreased balance strategies.  Prior to hospitalization, patient could complete ADLs with supervision.  Patient will benefit from skilled intervention to decrease level of assist with basic   self-care skills prior to  discharge home with care partner.  Anticipate patient will require 24 hour supervision and follow up home health.  OT - End of Session Activity Tolerance: Decreased this session Endurance Deficit: Yes Endurance Deficit Description: multiple rest breaks OT Assessment Rehab Potential (ACUTE ONLY): Fair OT Barriers to Discharge: Decreased caregiver support OT Barriers to Discharge Comments: lives with 70 y/o mother and father who is blind OT Patient demonstrates impairments in the following area(s): Balance;Safety;Behavior;Cognition;Vision;Endurance;Motor;Pain;Perception OT Basic ADL's Functional Problem(s): Grooming;Bathing;Dressing;Toileting OT Transfers Functional Problem(s): Toilet;Tub/Shower OT Additional Impairment(s): None OT Plan OT Intensity: Minimum of 1-2 x/day, 45 to 90 minutes OT Frequency: 5 out of 7 days OT Duration/Estimated Length of Stay: 21-24 days OT Treatment/Interventions: Balance/vestibular training;Discharge planning;Pain management;Self Care/advanced ADL retraining;Therapeutic Activities;UE/LE Coordination activities;Cognitive remediation/compensation;Functional mobility training;Patient/family education;Therapeutic Exercise;Community reintegration;DME/adaptive equipment instruction;Psychosocial support;UE/LE Strength taining/ROM OT Self Feeding Anticipated Outcome(s): S OT Basic Self-Care Anticipated Outcome(s): S OT Toileting Anticipated Outcome(s): S OT Bathroom Transfers Anticipated Outcome(s): S OT Recommendation Recommendations for Other Services: Other (comment)(none at this time) Patient destination: Home Follow Up Recommendations: Home health OT;24 hour supervision/assistance Equipment Recommended: To be determined   Skilled Therapeutic Intervention Upon entering the room, pt seated in wheelchair with no signs or c/o pain. Pt standing from wheelchair with min A and ambulating 10' with RW into bathroom with max multimodal cues and increased time. Pt seated  on TTB and able to initiate washing a few parts with increased time and tactile cuing. Pt required total hand over hand assistance to complete bathing task. Pt unable to follow verbal commands for sequencing transfer out of shower and tightly held on to wall and was resistive to therapist attempting to place hands on RW. Pt standing with mod standing balance for therapist to don brief and hospital gown. Pt returning to sit in wheelchair with quick release belt donned for safety. Pt seated at sink and initiating brushing teeth with set up and placement on brush in R UE. Pt remained seated in wheelchair at end of session with call bell within reach.   OT Evaluation Precautions/Restrictions  Precautions Precautions: Fall Restrictions Weight Bearing Restrictions: No Pain Pain Assessment Pain Scale: Faces Pain Score: 0-No pain Faces Pain Scale: No hurt Home Living/Prior Functioning Home Living Available Help at Discharge: Family, Available 24 hours/day Type of Home: Apartment Home Access: Ramped entrance Home Layout: One level Bathroom Shower/Tub: Tub/shower unit, Industrial/product designer: Yes  Lives With: Family Prior Function Level of Independence: Requires assistive device for independence, Needs assistance with ADLs Driving: No Vocation: On disability Vision Baseline Vision/History: Wears glasses Wears Glasses: At all times Patient Visual Report: No change from baseline Additional Comments: unable to formally test but will continue to assess in functional tasks Cognition Overall Cognitive Status: Impaired/Different from baseline Arousal/Alertness: Awake/alert Orientation Level: Person Year: (does not state) Month: (does not state) Day of Week: Other (Comment)(does not state) Memory: Impaired Immediate Memory Recall: (0/3) Memory Recall: (0/3 but also HOH and would not repeat) Attention: Focused Focused Attention: Impaired Focused Attention  Impairment: Verbal basic;Functional basic Awareness: Impaired Awareness Impairment: Intellectual impairment Problem Solving: Impaired Problem Solving Impairment: Functional basic;Verbal basic Executive Function: Initiating Initiating: Impaired Initiating Impairment: Verbal basic;Functional basic Safety/Judgment: Impaired Sensation Sensation Light Touch: Impaired by gross assessment Stereognosis: Not tested Hot/Cold: Not tested Proprioception: Impaired by gross assessment Coordination Gross Motor Movements are Fluid and Coordinated: No Fine Motor Movements are Fluid and Coordinated: No Motor  Motor Motor: Motor apraxia;Abnormal postural alignment and control  Mobility  Transfers Transfers: Sit to Stand;Stand to Sit Sit to Stand: 4: Min assist Sit to Stand Details: Verbal cues for precautions/safety;Tactile cues for sequencing;Tactile cues for initiation;Visual cues/gestures for sequencing Stand to Sit: 4: Min assist  Trunk/Postural Assessment  Cervical Assessment Cervical Assessment: Exceptions to WFL(forward head) Thoracic Assessment Thoracic Assessment: Within Functional Limits Lumbar Assessment Lumbar Assessment: Exceptions to WFL(posterior pelvic tilt) Postural Control Postural Control: Deficits on evaluation Righting Reactions: delayed  Balance Static Standing Balance Static Standing - Comment/# of Minutes: min/mod Dynamic Standing Balance Dynamic Standing - Comments: mod - max A Extremity/Trunk Assessment RUE Assessment RUE Assessment: Exceptions to WFL(unable to formally assess) LUE Assessment LUE Assessment: Exceptions to WFL(unable to formally assess)   See Function Navigator for Current Functional Status.   Refer to Care Plan for Long Term Goals  Recommendations for other services: None    Discharge Criteria: Patient will be discharged from OT if patient refuses treatment 3 consecutive times without medical reason, if treatment goals not met, if there  is a change in medical status, if patient makes no progress towards goals or if patient is discharged from hospital.  The above assessment, treatment plan, treatment alternatives and goals were discussed and mutually agreed upon: by patient  ,  P 04/25/2017, 12:05 PM  

## 2017-04-25 NOTE — Progress Notes (Signed)
   04/25/17 1339  What Happened  Was fall witnessed? No  Was patient injured? No  Patient found in hallway;on floor  Found by Staff-comment (Bri, NT)  Stated prior activity bathroom-unassisted (found scooting acorss floor to 'bathroom' incont bowel movem)  Follow Up  MD notified [name]  Family notified Yes-comment (mother)  Time family notified 29 (voicemail)  Additional tests No  Adult Fall Risk Assessment  Risk Factor Category (scoring not indicated) High fall risk per protocol (document High fall risk)  Patient's Fall Risk High Fall Risk (>13 points)  Adult Fall Risk Interventions  Required Bundle Interventions *See Row Information* High fall risk - low, moderate, and high requirements implemented  Additional Interventions Lap belt while in chair/wheelchair;Use of appropriate toileting equipment (bedpan, BSC, etc.)  Screening for Fall Injury Risk (To be completed on HIGH fall risk patients) - Assessing Need for Low Bed  Risk For Fall Injury- Low Bed Criteria Admitted as a result of a fall;Previous fall this admission  Will Implement Low Bed and Floor Mats Yes  Screening for Fall Injury Risk (To be completed on HIGH fall risk patients who do not meet crieteria for Low Bed) - Assessing Need for Floor Mats Only  Risk For Fall Injury- Criteria for Floor Mats Confusion/dementia (+CAM, CIWA, TBI, etc.)  Will Implement Floor Mats Yes  Vitals  Temp 98 F (36.7 C)  Temp Source Oral  BP 124/77  BP Location Right Arm  BP Method Automatic  Patient Position (if appropriate) Sitting  Pulse Rate 100  Pulse Rate Source Dinamap  Resp 20  Oxygen Therapy  SpO2 100 %  O2 Device Room Air  Pain Assessment  Pain Scale Faces  Pain Score 0  Neurological  Neuro (WDL) X  Level of Consciousness Alert  Orientation Level Oriented to person  Cognition Impulsive;Poor attention/concentration;Poor judgement;Poor safety awareness;Memory impairment  Speech Slurred/Dysarthria;Delayed  responses;Expressive aphasia  Neuro Symptoms Forgetful  Musculoskeletal  Musculoskeletal (WDL) X  Assistive Device Wheelchair  Generalized Weakness Yes  Integumentary  Integumentary (WDL) X  Skin Color Appropriate for ethnicity  Skin Condition Dry  Skin Integrity Ecchymosis  Ecchymosis Location Arm  Ecchymosis Location Orientation Right;Left

## 2017-04-26 ENCOUNTER — Inpatient Hospital Stay (HOSPITAL_COMMUNITY): Payer: Medicare Other | Admitting: Occupational Therapy

## 2017-04-26 ENCOUNTER — Inpatient Hospital Stay (HOSPITAL_COMMUNITY): Payer: Medicare Other | Admitting: Physical Therapy

## 2017-04-26 ENCOUNTER — Inpatient Hospital Stay (HOSPITAL_COMMUNITY): Payer: Medicare Other | Admitting: Speech Pathology

## 2017-04-26 NOTE — Progress Notes (Signed)
[place] PHYSICAL MEDICINE & REHABILITATION     PROGRESS NOTE    Subjective/Complaints: No new issues overnight. Pt alert sitting in bed ROS: Limited due to cognitive/behavioral     Objective: Vital Signs: Blood pressure 108/70, pulse 84, temperature 98 F (36.7 C), temperature source Oral, resp. rate 20, weight 90.7 kg (199 lb 15.7 oz), SpO2 95 %. No results found. Recent Labs    04/25/17 0626  WBC 10.3  HGB 13.1  HCT 39.0  PLT 306   Recent Labs    04/25/17 0626  NA 133*  K 3.7  CL 96*  GLUCOSE 84  BUN 11  CREATININE 1.12  CALCIUM 8.6*   CBG (last 3)  Recent Labs    04/24/17 0314 04/24/17 0712 04/24/17 1141  GLUCAP 80 92 103*    Wt Readings from Last 3 Encounters:  04/26/17 90.7 kg (199 lb 15.7 oz)  04/24/17 86.1 kg (189 lb 13.1 oz)  02/13/17 91.2 kg (201 lb)    Physical Exam:    Constitutional: No distress . Vital signs reviewed. Irregularities on scalp HEENT: EOMI, oral membranes moist Cardiovascular: RRR without murmur. No JVD    Respiratory: CTA Bilaterally without wheezes or rales. Normal effort    GI: BS +, non-tender, non-distended  Skin. Warm and dry. Craniotomy sites clean and dry Neurological. alert, makes eye contact. Ongoing delays in processing  Psych: flat    Assessment/Plan: 1. Functional and cognitive deficits secondary to SDH which require 3+ hours per day of interdisciplinary therapy in a comprehensive inpatient rehab setting. Physiatrist is providing close team supervision and 24 hour management of active medical problems listed below. Physiatrist and rehab team continue to assess barriers to discharge/monitor patient progress toward functional and medical goals.  Function:  Bathing Bathing position   Position: Shower  Bathing parts Body parts bathed by patient: Chest, Abdomen, Front perineal area Body parts bathed by helper: Right arm, Left arm, Buttocks, Right upper leg, Left upper leg, Right lower leg, Left lower  leg, Back  Bathing assist Assist Level: (max A)      Upper Body Dressing/Undressing Upper body dressing   What is the patient wearing?: Hospital gown                Upper body assist Assist Level: (total A)      Lower Body Dressing/Undressing Lower body dressing   What is the patient wearing?: Non-skid slipper socks         Non-skid slipper socks- Performed by patient: Don/doff right sock Non-skid slipper socks- Performed by helper: Don/doff left sock                  Lower body assist Assist for lower body dressing: (mod a)      Toileting Toileting Toileting activity did not occur: No continent bowel/bladder event Toileting steps completed by patient: Performs perineal hygiene Toileting steps completed by helper: Adjust clothing prior to toileting, Adjust clothing after toileting Toileting Assistive Devices: Grab bar or rail  Toileting assist Assist level: Touching or steadying assistance (Pt.75%)   Transfers Chair/bed transfer     Chair/bed transfer assist level: Moderate assist (Pt 50 - 74%/lift or lower)       Locomotion Ambulation     Max distance: 10 Assist level: Moderate assist (Pt 50 - 74%)   Wheelchair          Cognition Comprehension Comprehension assist level: Understands basic less than 25% of the time/ requires cueing >75% of the time  Expression Expression assist level: Expresses basis less than 25% of the time/requires cueing >75% of the time.  Social Interaction Social Interaction assist level: Interacts appropriately less than 25% of the time. May be withdrawn or combative.  Problem Solving Problem solving assist level: Solves basic less than 25% of the time - needs direction nearly all the time or does not effectively solve problems and may need a restraint for safety  Memory Memory assist level: Recognizes or recalls less than 25% of the time/requires cueing greater than 75% of the time   Medical Problem List and  Plan: 1.Decreased functional mobilitysecondary to right subdural hematoma after fall complicated by acute hypoxic respiratory failure and stridor as well as history of glioblastoma and seizure disorder -continue therapies   -delays in initiation/processing 2. DVT Prophylaxis/Anticoagulation/history of heparin-induced thrombocytopenia: SCDs. Chronic Arixtra currently on hold due to hematoma 3. Pain Management:Tylenol as needed 4. Mood:Prozac 20 mg daily 5. Neuropsych: This patientiscapable of making decisions on hisown behalf. 6. Skin/Wound Care:Routine skin checks 7. Fluids/Electrolytes/Nutrition:encourage PO   .follow labs intermittently   8.Seizure disorder. Vimpat 200 mg twice daily, Keppra 1500 mg twice daily. Follow-up neurology services Dr. Krista Blue as outpatient 9.Dysphagia. remains on Dysphagia #1 thin liquids.  y -full supervision with meals 10.Hypothyroidism. Synthroid 11.Hyperlipidemia. Lipitor 12.Leukocytosis. Felt to be reactive from recent Decadron---down to 10.3   4/9 13.MRSA PCR screening positive. Maintain contact precautions 14.Constipation. Laxative assistance   LOS (Days) [place] EVALUATION WAS PERFORMED  Meredith Staggers, MD 04/26/2017 9:09 AM

## 2017-04-26 NOTE — Progress Notes (Signed)
Physical Therapy Note  Patient Details  Name: Glen [name] MRN: [id number] Date of Birth: 03-25-1970 Today's Date: 04/26/2017    Time: 1316-1428 72 minutes  1:1 No c/o pain. Pt eating lunch when PT arrived.  Pt given initial cues for placing spoon in pts hand and gesturing cues to initiate eating. Pt then able to self feed entire lunch tray with cues only for small bites. Pt able to find items on tray and initiate drinking and wiping mouth as needed. Pt performs gait to/from toilet with RW and min/mod A.  Pt incontinent of urine but able to urinate more on toilet.  Pt performs dressing with max A for donning pants, total A to don shirt, min A for standing balance to don pants.  Pt performs gait 2 x 20' with min A, multimodal cues for initiation and safety with turning to sit.  nustep x 8 minutes with pt at 60-70 steps per minute self selected pace. Attempted to engage pt in coloring task as his mother had provided crayons and coloring book and states pt colors at home.  Pt requires max cuing to initiate, hand over hand to begin coloring and then perseverated on turning pages. Pt able to copy shapes of circle and square with mod multimodal cues, pt then with decreased attention, unable to attend to more coloring or drawing tasks.  Pt left at nurses stating with chair alarm and quick release belt in place.   Emilygrace Grothe 04/26/2017, 2:30 PM

## 2017-04-26 NOTE — Progress Notes (Signed)
Speech Language Pathology Daily Session Note  Patient Details  Name: KEYLIN PODOLSKY MRN: [id number] Date of Birth: 12/22/1970  Today's Date: 04/26/2017 SLP Individual Time: 1005-1030 SLP Individual Time Calculation (min): 25 min  Short Term Goals: Week 1: SLP Short Term Goal 1 (Week 1): Pt will consume dys 1 textures and thin liquids with max assist for use of swallowing precautions and minimal overt s/s of aspiration over 3 consecutive sessions.  SLP Short Term Goal 2 (Week 1): Pt will complete basic, familiar tasks with max assist multimodal cues for task initiation and sequencing.   SLP Short Term Goal 3 (Week 1): Pt will locate items to the left of midline in 50% of opportunities during basic, familiar tasks with max assist multimodal cues.   SLP Short Term Goal 4 (Week 1): Pt will focus his attention to stimulation in 50% of opportunities with max assist multimodal cues.   SLP Short Term Goal 5 (Week 1): Pt will verbalize at the word level to convey needs and wants to caregivers with max assist multimodal cues.    Skilled Therapeutic Interventions:   Pt was seen for skilled ST targeting goals for cognition and dysphagia.  Pt received sitting upright in wheelchair at nursing station.  Verbal initiation much improved in comparison to yesterday's evaluation and pt was able to respond appropriately to therapist's greeting and questions with mod assist cues for repetition of information.  With increased verbal output, speech noted to be slurred but this could also be related to pt's missing dentition.  Pt needed max assist to reorient to place, total assist to reorient to date, but was independently oriented to situation.  SLP facilitated the session with skilled observations completed during presentations of pt's currently prescribed diet.  Pt fed himself with mod-max assist verbal cues for task initiation.  No overt s/s of aspiration with purees or thin liquids.   Pt was returned to nursing  station and left in wheelchair with quick release belt donned.    Continue per current plan of care.      Function:  Eating Eating   Modified Consistency Diet: Yes Eating Assist Level: Supervision or verbal cues;Set up assist for   Eating Set Up Assist For: Opening containers       Cognition Comprehension Comprehension assist level: Understands basic 50 - 74% of the time/ requires cueing 25 - 49% of the time  Expression   Expression assist level: Expresses basic 25 - 49% of the time/requires cueing 50 - 75% of the time. Uses single words/gestures.  Social Interaction Social Interaction assist level: Interacts appropriately 25 - 49% of time - Needs frequent redirection.  Problem Solving Problem solving assist level: Solves basic less than 25% of the time - needs direction nearly all the time or does not effectively solve problems and may need a restraint for safety  Memory Memory assist level: Recognizes or recalls less than 25% of the time/requires cueing greater than 75% of the time    Pain Pain Assessment Pain Scale: 0-10 Pain Score: 0-No pain Faces Pain Scale: No hurt  Therapy/Group: Individual Therapy  Bayley Hurn, Selinda Orion 04/26/2017, 10:34 AM

## 2017-04-26 NOTE — Progress Notes (Signed)
Occupational Therapy Session Note  Patient Details  Name: Glen [name] MRN: [id number] Date of Birth: 01/02/1971  Today's Date: 04/26/2017 OT Individual Time: 1100-1159 OT Individual Time Calculation (min): 59 min    Short Term Goals: Week 1:  OT Short Term Goal 1 (Week 1): Pt will transfer onto toilet with min A overall to increase I with functional transfers.  OT Short Term Goal 2 (Week 1): Pt will don LB clothing items with mod A to decrease level of assistance with self care.  OT Short Term Goal 3 (Week 1): Pt will initiate grooming tasks with mod multimodal cues while seated at sink.   Skilled Therapeuabltic Interventions/Progress Updates:    Pt received at [employer] station. Pt with no c/o, signs, or symptoms of pain this session. OT propelled pt to [facility] gym to limit external distractions. Pt able to verbalize his name, mothers name, and birth date with increased time. Pt did not answer questions about location and situation when asked. OT wrote simple question on paper and pt looked at it but did not attempt to answer verbally. Pt engaged in dynavision task with lights off in order to encourage attention to board only. Pt initiated hitting 6 targets within 4 minutes with max multimodal cues. Pt did verbalize , "outside" when asked what he liked to do. Pt was very restless at this point. OT assisted pt outside via wheelchair and pt immediately appearing to be calmer and had big smile on face. OT returned pt back to room at end of session with family present. Quick release belt donned and chair alarm activated.   Therapy Documentation Precautions:  Precautions Precautions: Fall Restrictions Weight Bearing Restrictions: No General:   Vital Signs:   Pain: Pain Assessment Pain Scale: 0-10 Pain Score: 0-No pain Faces Pain Scale: No hurt  See Function Navigator for Current Functional Status.   Therapy/Group: Individual Therapy  Gypsy Decant 04/26/2017, 12:54 PM

## 2017-04-26 NOTE — Progress Notes (Signed)
Physical Therapy Session Note  Patient Details  Name: Glen [name] MRN: [id number] Date of Birth: 20-Jan-1970  Today's Date: 04/26/2017 PT Individual Time: 0900-0930 PT Individual Time Calculation (min): 30 min   Short Term Goals: Week 1:  PT Short Term Goal 1 (Week 1): pt will demo focused attention to functional task x 30 seconds wtih min A PT Short Term Goal 2 (Week 1): pt will perform gait x 50' in controlled environment with min A  Skilled Therapeutic Interventions/Progress Updates:    Pt supine in bed, no complaints of pain. Supine to sit with CGA with max cues and increased time to complete task. Sit to stand with min assist to RW. Ambulation x 10 ft from bed to bathroom with RW and mod assist for balance, pt ataxic. Standing balance with RW and min assist while brief change and pericare performed dependently. Ambulation x 10 ft from bathroom to w/c with RW and mod assist for balance. Standing balance with RW and min assist while gown adjusted. Pt is able to follow simple one-step commands with increased time to process throughout therapy session. Pt is able to verbally repeat directions and then follow them. Pt left seated in w/c at nurse's station with chair alarm and quick-release belt in place.  Therapy Documentation Precautions:  Precautions Precautions: Fall Restrictions Weight Bearing Restrictions: No  See Function Navigator for Current Functional Status.   Therapy/Group: Individual Therapy  Excell Seltzer, PT, DPT  04/26/2017, 12:16 PM

## 2017-04-26 NOTE — Patient Care Conference (Signed)
Inpatient RehabilitationTeam Conference and Plan of Care Update Date: 04/25/2017   Time: 2:30 PM    Patient Name: Glen [name]      Medical Record Number: [id number]  Date of Birth: 47-Apr-1972 Sex: Male         Room/Bed: 4W16C/4W16C-01 Payor Info: Payor: MEDICARE / Plan: MEDICARE PART A AND B / Product Type: *No Product type* /    Admitting Diagnosis: SDH's crani  Admit Date/Time:  04/24/2017  5:09 PM Admission Comments: No comment available   Primary Diagnosis:  <principal problem not specified> Principal Problem: <principal problem not specified>  Patient Active Problem List   Diagnosis Date Noted  . Traumatic subdural hematoma ([place]) 04/24/2017  . Abnormal movements   . Subdural hematoma ([place]) 04/09/2017  . Seizures ([place]) 02/13/2017    Expected Discharge Date: Expected Discharge Date: (3 wks)  Team Members Present: Physician leading conference: Dr. Alger Simons Social Worker Present: Lennart Pall, LCSW Nurse Present: Junius Creamer, RN PT Present: Roderic Ovens, PT OT Present: Benay Pillow, OT SLP Present: Weston Anna, SLP PPS Coordinator present : Daiva Nakayama, RN, CRRN     Current Status/Progress Goal Weekly Team Focus  Medical   Patient admitted after right subdural hemorrhage.  History significant for chronic seizures and glioblastoma  Maximize arousal and cognition to allow improved functional capabilities  Seizure control, nutrition, wound care management, normalization of sleep and wake cycle   Bowel/Bladder   incontinent of bowel and bladder, LBm 4-5  managed bowel and bladder with mod assist   timed tolieting   Swallow/Nutrition/ Hydration   Dys 1, thin liquids; full supervision, total assist   min assist   toleration of current diet    ADL's   mod A functional transfers with RW, max - total A for all other self care  supervision overall and min A for attention and awareness      Mobility   mod A transfers and gait with RW  supervision for mobility and  gait, min A attention and awareness  attention, awareness, functional mobility   Communication   total assist, limited initiation of verbal expression   min assist   verbal expression of immediate needs/wants to caregivers    Safety/Cognition/ Behavioral Observations  total assist  min assist   focused attention, visual scanning, initiation    Pain   no c/o pain  no c/o pain  Assess pain q shift and prn   Skin   healed incision to right side of head  no new skin issues  Assess skin q shift and prn    Rehab Goals Patient on target to meet rehab goals: Yes Rehab Goals Revised: none - pt's first conference on day of eval *See Care Plan and progress notes for long and short-term goals.     Barriers to Discharge  Current Status/Progress Possible Resolutions Date Resolved   Physician    Medical stability        Continue management as outlined in the chart      Nursing                  PT  Decreased caregiver support                 OT Decreased caregiver support  lives with 47 y/o mother and father who is blind             SLP                SW  Discharge Planning/Teaching Needs:  Family plans for pt to return to his home, but CSW needs to confirm the amount of care/supervision family can provide.  Family education offered closer to d/c.   Team Discussion:  Pt just being evaluated today and had a fall due to being able to slip under quick release belt.  Pt with multiple/complex medical issues.  Pt is currently min to mod A with mobility and cueing.  Pt showered today and initiated some with ADLs.  Pt is very HOH and it is unclear how much of this is affecting his ability to follow cues.  Pt's baseline is uncertain right now.  Pt is on D1 and thin liquids without any s/s of aspiration, but essentially non-verbal during speech eval, but talks more when his family arrived.  Goals are supervision overall.  Revisions to Treatment Plan:  none    Continued Need for Acute  Rehabilitation Level of Care: The patient requires daily medical management by a physician with specialized training in physical medicine and rehabilitation for the following conditions: Daily direction of a multidisciplinary physical rehabilitation program to ensure safe treatment while eliciting the highest outcome that is of practical value to the patient.: Yes Daily medical management of patient stability for increased activity during participation in an intensive rehabilitation regime.: Yes Daily analysis of laboratory values and/or radiology reports with any subsequent need for medication adjustment of medical intervention for : Neurological problems;Post surgical problems  Kennie Karapetian, Silvestre Mesi 04/27/2017, 11:28 AM

## 2017-04-27 ENCOUNTER — Inpatient Hospital Stay (HOSPITAL_COMMUNITY): Payer: Medicare Other | Admitting: Physical Therapy

## 2017-04-27 ENCOUNTER — Other Ambulatory Visit: Payer: Self-pay

## 2017-04-27 ENCOUNTER — Inpatient Hospital Stay (HOSPITAL_COMMUNITY): Payer: Medicare Other | Admitting: Occupational Therapy

## 2017-04-27 ENCOUNTER — Inpatient Hospital Stay (HOSPITAL_COMMUNITY): Payer: Medicare Other | Admitting: Speech Pathology

## 2017-04-27 NOTE — Progress Notes (Signed)
Physical Therapy Session Note  Patient Details  Name: Glen [name] MRN: [id number] Date of Birth: 08/09/1970  Today's Date: 04/27/2017 PT Individual Time: 0800-0900 PT Minutes: 60 minutes    Short Term Goals: Week 1:  PT Short Term Goal 1 (Week 1): pt will demo focused attention to functional task x 30 seconds wtih min A PT Short Term Goal 2 (Week 1): pt will perform gait x 50' in controlled environment with min A  Skilled Therapeutic Interventions/Progress Updates:    Pt supine in bed, easily arousable and agreeable to PT. Pt has no verbal complaints of pain and no body language indicating pain this AM. Supine to sit with min assist for trunk control with increased time and multimodal cues to initiate. Sit to stand with min assist to RW. Ambulation to bathroom with RW and min assist for balance. Standing balance with RW and min assist for brief change and pericare. Pt requires max to total assist for upper and lower body dressing with max cueing due to low initiation. Ambulation x 30 ft, x 75 ft with RW and min assist for balance, shuffling gait pattern. Nustep level 3 x 10 min with B UE/LE, avg of 80 steps/min. Pt left seated in w/c at nurse's station with chair alarm and quick release belt in place, set up with coloring book.  Therapy Documentation Precautions:  Precautions Precautions: Fall Restrictions Weight Bearing Restrictions: No  See Function Navigator for Current Functional Status.   Therapy/Group: Individual Therapy  Excell Seltzer, PT, DPT  04/27/2017, 8:46 AM

## 2017-04-27 NOTE — Progress Notes (Signed)
[facility] PHYSICAL MEDICINE & REHABILITATION     PROGRESS NOTE    Subjective/Complaints: Pt sitting up in bed. No new complaints. Denies pain  ROS: Limited due to cognitive/behavioral    Objective: Vital Signs: Blood pressure (!) 82/58, pulse 79, temperature 98.5 F (36.9 C), temperature source Oral, resp. rate 16, weight 85.7 kg (189 lb), SpO2 97 %. No results found. Recent Labs    04/25/17 0626  WBC 10.3  HGB 13.1  HCT 39.0  PLT 306   Recent Labs    04/25/17 0626  NA 133*  K 3.7  CL 96*  GLUCOSE 84  BUN 11  CREATININE 1.12  CALCIUM 8.6*   CBG (last 3)  Recent Labs    04/24/17 1141  GLUCAP 103*    Wt Readings from Last 3 Encounters:  04/27/17 85.7 kg (189 lb)  04/24/17 86.1 kg (189 lb 13.1 oz)  02/13/17 91.2 kg (201 lb)    Physical Exam:    Constitutional: No distress . Vital signs reviewed. HEENT: EOMI, oral membranes moist Cardiovascular: RRR without murmur. No JVD    Respiratory: CTA Bilaterally without wheezes or rales. Normal effort    GI: BS +, non-tender, non-distended  Skin. Warm and dry. Craniotomy sites clean and dry Neurological. alert, makes eye contact. HOH. Was quicker to follow simple commands. Moves all 4's with at least 4- to 4/5 strength. Senses pain in all 4's. Psych: flat but cooperative    Assessment/Plan: 1. Functional and cognitive deficits secondary to SDH which require 3+ hours per day of interdisciplinary therapy in a comprehensive inpatient rehab setting. Physiatrist is providing close team supervision and 24 hour management of active medical problems listed below. Physiatrist and rehab team continue to assess barriers to discharge/monitor patient progress toward functional and medical goals.  Function:  Bathing Bathing position   Position: Shower  Bathing parts Body parts bathed by patient: Chest, Abdomen, Front perineal area Body parts bathed by helper: Right arm, Left arm, Buttocks, Right upper leg, Left upper  leg, Right lower leg, Left lower leg, Back  Bathing assist Assist Level: (max A)      Upper Body Dressing/Undressing Upper body dressing   What is the patient wearing?: Hospital gown                Upper body assist Assist Level: (total A)      Lower Body Dressing/Undressing Lower body dressing   What is the patient wearing?: Non-skid slipper socks         Non-skid slipper socks- Performed by patient: Don/doff right sock Non-skid slipper socks- Performed by helper: Don/doff left sock                  Lower body assist Assist for lower body dressing: (mod a)      Naval architect activity did not occur: No continent bowel/bladder event Toileting steps completed by patient: Performs perineal hygiene Toileting steps completed by helper: Adjust clothing prior to toileting, Performs perineal hygiene, Adjust clothing after toileting Toileting Assistive Devices: Grab bar or rail  Toileting assist Assist level: Touching or steadying assistance (Pt.75%)   Transfers Chair/bed transfer   Chair/bed transfer method: Ambulatory Chair/bed transfer assist level: Touching or steadying assistance (Pt > 75%) Chair/bed transfer assistive device: Medical sales representative     Max distance: 75 ft Assist level: Moderate assist (Pt 50 - 74%)   Wheelchair          Cognition Comprehension Comprehension assist  level: Understands basic 50 - 74% of the time/ requires cueing 25 - 49% of the time  Expression Expression assist level: Expresses basis less than 25% of the time/requires cueing >75% of the time.  Social Interaction Social Interaction assist level: Interacts appropriately 25 - 49% of time - Needs frequent redirection.  Problem Solving Problem solving assist level: Solves basic less than 25% of the time - needs direction nearly all the time or does not effectively solve problems and may need a restraint for safety  Memory Memory assist level:  Recognizes or recalls less than 25% of the time/requires cueing greater than 75% of the time   Medical Problem List and Plan: 1.Decreased functional mobilitysecondary to right subdural hematoma after fall complicated by acute hypoxic respiratory failure and stridor as well as history of glioblastoma and seizure disorder -continue therapies   -working on initiation/processing 2. DVT Prophylaxis/Anticoagulation/history of heparin-induced thrombocytopenia: SCDs. Chronic Arixtra currently on hold due to hematoma 3. Pain Management:Tylenol as needed 4. Mood:Prozac 20 mg daily 5. Neuropsych: This patientiscapable of making decisions on hisown behalf. 6. Skin/Wound Care:Routine skin checks 7. Fluids/Electrolytes/Nutrition:encourage PO, eating well at present   .follow labs intermittently   8.Seizure disorder. Vimpat 200 mg twice daily, Keppra 1500 mg twice daily. Follow-up neurology services Dr. Krista Blue as outpatient 9.Dysphagia. remains on Dysphagia #1 thin liquids.  y -full supervision with meals 10.Hypothyroidism. Synthroid 11.Hyperlipidemia. Lipitor 12.Leukocytosis. Felt to be reactive from recent Decadron---down to 10.3   4/9   -recheck next week 13.MRSA PCR screening positive. Maintain contact precautions 14.Constipation. Laxative assistance   LOS (Days) [place] EVALUATION WAS PERFORMED  Meredith Staggers, MD 04/27/2017 9:06 AM

## 2017-04-27 NOTE — Progress Notes (Signed)
   04/27/17 1900  What Happened  Was fall witnessed? No (all interventions inplace)  Was patient injured? No  Patient found on floor  Found by Staff-comment (candice and Alta Shober )  Stated prior activity to/from bed, chair, or stretcher  Follow Up  MD notified Algis Liming  Time MD notified 7 Jeannene Patella Love saw pt after fall)  Additional tests No  Progress note created (see row info) Yes  Adult Fall Risk Assessment  Risk Factor Category (scoring not indicated) Fall has occurred during this admission (document High fall risk);High fall risk per protocol (document High fall risk)  Patient's Fall Risk High Fall Risk (>13 points)  Adult Fall Risk Interventions  Required Bundle Interventions *See Row Information* High fall risk - low, moderate, and high requirements implemented  Additional Interventions Lap belt while in chair/wheelchair;Family Supervision;Use of appropriate toileting equipment (bedpan, BSC, etc.);Room near nurses station (telesitter d/c due to lack of hearing and awareness)  Screening for Fall Injury Risk (To be completed on HIGH fall risk patients) - Assessing Need for Low Bed  Risk For Fall Injury- Low Bed Criteria Previous fall this admission  Will Implement Low Bed and Floor Mats Yes  Screening for Fall Injury Risk (To be completed on HIGH fall risk patients who do not meet crieteria for Low Bed) - Assessing Need for Floor Mats Only  Risk For Fall Injury- Criteria for Floor Mats Confusion/dementia (+CAM, CIWA, TBI, etc.);Noncompliant with safety precautions  Will Implement Floor Mats Yes  Vitals  Temp 98.9 F (37.2 C)  Temp Source Oral  BP 120/85  MAP (mmHg) 91  BP Location Left Arm  BP Method Automatic  Patient Position (if appropriate) Lying  Pulse Rate 96  Resp 18  Oxygen Therapy  SpO2 98 %  O2 Device Room Air  Pain Assessment  Pain Scale 0-10  Pain Score 0  Neurological  Neuro (WDL) X  Level of Consciousness Alert  Orientation Level Oriented to  person (poor awareness- unable to process)  Cognition Impulsive;Poor attention/concentration;Poor judgement;Poor safety awareness;Memory impairment  Speech Slurred/Dysarthria;Delayed responses;Expressive aphasia  Musculoskeletal  Musculoskeletal (WDL) X  Assistive Device Wheelchair  Generalized Weakness Yes  Integumentary  Integumentary (WDL) X  Skin Condition Dry  Skin Integrity Ecchymosis;Surgical Incision (see LDA);Abrasion  Abrasion Location Arm  Abrasion Location Orientation Right  Ecchymosis Location Arm  Ecchymosis Location Orientation Right;Left

## 2017-04-27 NOTE — IPOC Note (Signed)
Overall Plan of Care [facility]) Patient Details Name: Glen [name] MRN: [id number] DOB: 03-Feb-1970  Admitting Diagnosis: <principal problem not specified>SDH  Hospital Problems: Active Problems:   Traumatic subdural hematoma (HCC)     Functional Problem List: Nursing Bladder, Bowel, Endurance, Medication Management, Perception, Safety  PT Balance, Behavior, Endurance, Motor, Pain, Sensory  OT Balance, Safety, Behavior, Cognition, Vision, Endurance, Motor, Pain, Perception  SLP Cognition, Nutrition  TR         Basic ADL's: OT Grooming, Bathing, Dressing, Toileting     Advanced  ADL's: OT       Transfers: PT Bed Mobility, Bed to Chair, Car, Manufacturing systems engineer, Metallurgist: PT Stairs, Ambulation, Wheelchair Mobility     Additional Impairments: OT None  SLP Swallowing, Social Cognition, Communication expression Attention, Awareness  TR      Anticipated Outcomes Item Anticipated Outcome  Self Feeding S  Swallowing  Min assist    Basic self-care  S  Toileting  S   Bathroom Transfers S  Bowel/Bladder  Min assist  Transfers  supervision  Locomotion  supervision  Communication  Min assist   Cognition  Min assist   Pain  3 or less  Safety/Judgment  mod assist   Therapy Plan: PT Intensity: Minimum of 1-2 x/day ,45 to 90 minutes PT Frequency: 5 out of 7 days PT Duration Estimated Length of Stay: 21-24 days OT Intensity: Minimum of 1-2 x/day, 45 to 90 minutes OT Frequency: 5 out of 7 days OT Duration/Estimated Length of Stay: 21-24 days SLP Intensity: Minumum of 1-2 x/day, 30 to 90 minutes SLP Frequency: 3 to 5 out of 7 days SLP Duration/Estimated Length of Stay: 28 days     Team Interventions: Nursing Interventions Patient/Family Education, Disease Management/Prevention, Bladder Management, Bowel Management, Medication Management, Dysphagia/Aspiration Precaution Training, Cognitive Remediation/Compensation  PT interventions  Ambulation/gait training, Pain management, Stair training, Training and development officer, DME/adaptive equipment instruction, Patient/family education, Therapeutic Activities, Wheelchair propulsion/positioning, Cognitive remediation/compensation, Therapeutic Exercise, Community reintegration, Functional mobility training, UE/LE Strength taining/ROM, Discharge planning, Neuromuscular re-education, Splinting/orthotics, UE/LE Coordination activities  OT Interventions Balance/vestibular training, Discharge planning, Pain management, Self Care/advanced ADL retraining, Therapeutic Activities, UE/LE Coordination activities, Cognitive remediation/compensation, Functional mobility training, Patient/family education, Therapeutic Exercise, Community reintegration, Engineer, drilling, Psychosocial support, UE/LE Strength taining/ROM  SLP Interventions Cognitive remediation/compensation, Cueing hierarchy, Dysphagia/aspiration precaution training, Functional tasks, Patient/family education, Multimodal communication approach, Internal/external aids, Environmental controls  TR Interventions    SW/CM Interventions Discharge Planning, Psychosocial Support, Patient/Family Education   Barriers to Discharge MD  Medical stability  Nursing      PT Decreased caregiver support    OT Decreased caregiver support lives with 47 y/o mother and father who is blind  SLP      SW       Team Discharge Planning: Destination: PT-Home ,OT- Home , SLP-Home Projected Follow-up: PT-Home health PT, OT-  Home health OT, 24 hour supervision/assistance, SLP-Home Health SLP, Outpatient SLP, 24 hour supervision/assistance Projected Equipment Needs: PT-To be determined, OT- To be determined, SLP-None recommended by SLP Equipment Details: PT- , OT-  Patient/family involved in discharge planning: PT- Patient unable/family or caregiver not available,  OT-Patient, SLP-Patient unable/family or caregive not available  MD ELOS:  3wks + Medical Rehab Prognosis:  Good Assessment: The patient has been admitted for CIR therapies with the diagnosis of SDH hx of glioblastoma,seizures. The team will be addressing functional mobility, strength, stamina, balance, safety, adaptive techniques and equipment, self-care, bowel and bladder mgt, patient  and caregiver education, NMR, cognitive perceptual awareness, communication, swallowing, community reentry. Goals have been set at supervision to min assist with self-care and ADL's, supervision with mobility and min assist with cognition.    Meredith Staggers, MD, FAAPMR      See Team Conference Notes for weekly updates to the plan of care

## 2017-04-27 NOTE — Progress Notes (Signed)
Occupational Therapy Session Note  Patient Details  Name: Glen [name] MRN: [id number] Date of Birth: 05/15/1970  Today's Date: 04/27/2017 OT Individual Time: 1400-1500 OT Individual Time Calculation (min): 60 min    Short Term Goals: Week 1:  OT Short Term Goal 1 (Week 1): Pt will transfer onto toilet with min A overall to increase I with functional transfers.  OT Short Term Goal 2 (Week 1): Pt will don LB clothing items with mod A to decrease level of assistance with self care.  OT Short Term Goal 3 (Week 1): Pt will initiate grooming tasks with mod multimodal cues while seated at sink.   Skilled Therapeutic Interventions/Progress Updates:    Pt seen for OT session focusing on task initiation, attention to task, and functional mobility in w/c. Pt recevied sitting in w/c, agreeable to tx session and denying pain.  Completed coloring activity at table top level, initially requiring max cuing for initiation, however, once initiated, pt able to attend to drawing task ~20 seconds before needing redirection. He was unable to verbalize animal which he was coloring despite giving  Option of two. He was able to correctly select color of crayon from option of 3. Pt then completed w/c mobility throughout unit using B LEs, requiring mod cuing for awareness to environmental obstacles on R.  Pt returned to nurses station at end of session, QRB donned and personal items within reach.   Therapy Documentation Precautions:  Precautions Precautions: Fall Restrictions Weight Bearing Restrictions: No Pain:   No/denies pain  See Function Navigator for Current Functional Status.   Therapy/Group: Individual Therapy  Glen [name] 04/27/2017, 7:07 AM

## 2017-04-27 NOTE — Progress Notes (Signed)
Physical Therapy Session Note  Patient Details  Name: Glen [name] MRN: [id number] Date of Birth: [birth year]  Today's Date: 04/27/2017 PT Individual Time: 4076-8088 PT Individual Time Calculation (min): 25 min   Short Term Goals: Week 1:  PT Short Term Goal 1 (Week 1): pt will demo focused attention to functional task x 30 seconds wtih min A PT Short Term Goal 2 (Week 1): pt will perform gait x 50' in controlled environment with min A  Skilled Therapeutic Interventions/Progress Updates:      Pt received sitting in WC and agreeable to PT.   Pt transported to day room in [facility]. Stand pivot transfer to and from nustep with min assist and moderate multimodal cues for UE placement, WC safety, and technique. Nustep reciprocal movement and endurance training x 10 minutes, level 5, with min cues throughout for decreased speed to prevent excessive fatigue and mimic standard gait speed. Pt reports moderate fatigue following endurance/attention training on nustep, but unable to quantify on Borg scale.   Patient returned to room and left sitting in [facility] with brother and mother present, call bell in reach, and all needs met.       Therapy Documentation Precautions:  Precautions Precautions: Fall Restrictions Weight Bearing Restrictions: No Vital Signs: Therapy Vitals Temp: 98.3 F (36.8 C) Temp Source: Oral Pulse Rate: 89 Resp: 18 BP: 121/69 Patient Position (if appropriate): Sitting Oxygen Therapy SpO2: 100 % O2 Device: Room Air Pain: Pain Assessment Pain Scale: 0-10 Pain Score: 0-No pain  See Function Navigator for Current Functional Status.   Therapy/Group: Individual Therapy  Lorie Phenix 04/27/2017, 5:24 PM

## 2017-04-27 NOTE — Progress Notes (Signed)
Speech Language Pathology Daily Session Note  Patient Details  Name: Glen [name] MRN: [id number] Date of Birth: [birth year]  Today's Date: 04/27/2017 SLP Individual Time: 1950-9326 SLP Individual Time Calculation (min): 55 min  Short Term Goals: Week 1: SLP Short Term Goal 1 (Week 1): Pt will consume dys 1 textures and thin liquids with max assist for use of swallowing precautions and minimal overt s/s of aspiration over 3 consecutive sessions.  SLP Short Term Goal 2 (Week 1): Pt will complete basic, familiar tasks with max assist multimodal cues for task initiation and sequencing.   SLP Short Term Goal 3 (Week 1): Pt will locate items to the left of midline in 50% of opportunities during basic, familiar tasks with max assist multimodal cues.   SLP Short Term Goal 4 (Week 1): Pt will focus his attention to stimulation in 50% of opportunities with max assist multimodal cues.   SLP Short Term Goal 5 (Week 1): Pt will verbalize at the word level to convey needs and wants to caregivers with max assist multimodal cues.    Skilled Therapeutic Interventions:  Pt was seen for skilled ST targeting goals for dysphagia and cognition.  Pt continues to demonstrate improving initiation of tasks and verbalizations.  As a result, therapist facilitated the session with trials of dys 2 textures to work towards diet progression.  Pt has an atypical but functional oral phase for solids with complete clearance of residuals from the oral cavity.  Pt did have x2 episodes of coughing with straw sips of thin liquids which SLP suspects to be related to some extent of premature spillage resulting from decreased oral control of boluses of thin liquids.  Would recommend ongoing trials of dys 2 textures at next available appointment to continue working towards diet progression.  SLP also facilitated the session with a basic puzzle task to address visual scanning, attention, and initiation/sequencing.  Pt needed max to total  assist for problem solving to complete task but was able to sustain his attention to activity for ~10 minutes in a quiet environment with no cues needed for redirection.  Pt was returned to the nursing station in his wheelchair with quick release belt donned.  Continue per current plan of care.     Function:  Eating Eating   Modified Consistency Diet: Yes Eating Assist Level: Supervision or verbal cues;Set up assist for   Eating Set Up Assist For: Opening containers       Cognition Comprehension Comprehension assist level: Understands basic 50 - 74% of the time/ requires cueing 25 - 49% of the time  Expression   Expression assist level: Expresses basic 25 - 49% of the time/requires cueing 50 - 75% of the time. Uses single words/gestures.  Social Interaction Social Interaction assist level: Interacts appropriately 25 - 49% of time - Needs frequent redirection.  Problem Solving Problem solving assist level: Solves basic 25 - 49% of the time - needs direction more than half the time to initiate, plan or complete simple activities  Memory Memory assist level: Recognizes or recalls 25 - 49% of the time/requires cueing 50 - 75% of the time    Pain Pain Assessment Pain Scale: 0-10 Pain Score: 0-No pain  Therapy/Group: Individual Therapy  Deloise Marchant, Selinda Orion 04/27/2017, 4:05 PM

## 2017-04-28 ENCOUNTER — Inpatient Hospital Stay (HOSPITAL_COMMUNITY): Payer: Medicare Other | Admitting: Occupational Therapy

## 2017-04-28 ENCOUNTER — Inpatient Hospital Stay (HOSPITAL_COMMUNITY): Payer: Medicare Other

## 2017-04-28 ENCOUNTER — Inpatient Hospital Stay (HOSPITAL_COMMUNITY): Payer: Medicare Other | Admitting: Physical Therapy

## 2017-04-28 DIAGNOSIS — E871 Hypo-osmolality and hyponatremia: Secondary | ICD-10-CM

## 2017-04-28 DIAGNOSIS — R1310 Dysphagia, unspecified: Secondary | ICD-10-CM

## 2017-04-28 NOTE — Progress Notes (Signed)
[facility] PHYSICAL MEDICINE & REHABILITATION     PROGRESS NOTE    Subjective/Complaints: Patient seen lying in bed this morning. No reported issues overnight.  ROS: Limited due to [place]   Objective: Vital Signs: Blood pressure 104/71, pulse 79, temperature 98 F (36.7 C), temperature source Oral, resp. rate 18, weight 86 kg (189 lb 9.5 oz), SpO2 95 %. No results found. No results for input(s): WBC, HGB, HCT, PLT in the last 72 hours. No results for input(s): NA, K, CL, GLUCOSE, BUN, CREATININE, CALCIUM in the last 72 hours.  Invalid input(s): CO CBG (last 3)  No results for input(s): GLUCAP in the last 72 hours.  Wt Readings from Last 3 Encounters:  04/28/17 86 kg (189 lb 9.5 oz)  04/24/17 86.1 kg (189 lb 13.1 oz)  02/13/17 91.2 kg (201 lb)    Physical Exam:    Constitutional: No distress . Vital signs reviewed. HENT: scalp incision C/D/I Eyes: EOMI. No discharge. Cardiovascular: RRR. No JVD    Respiratory: CTA Bilaterally. Normal effort    GI: BS +, non-distended  Neurological. Alert HOH.  Motor: Spontaneously moving all extremities. Skin. Craniotomy sites clean and dry Psych: Flat, limited due to [facility].  Assessment/Plan: 1. Functional and cognitive deficits secondary to SDH which require 3+ hours per day of interdisciplinary therapy in a comprehensive inpatient rehab setting. Physiatrist is providing close team supervision and 24 hour management of active medical problems listed below. Physiatrist and rehab team continue to assess barriers to discharge/monitor patient progress toward functional and medical goals.  Function:  Bathing Bathing position   Position: Shower  Bathing parts Body parts bathed by patient: Chest, Abdomen, Front perineal area Body parts bathed by helper: Right arm, Left arm, Buttocks, Right upper leg, Left upper leg, Right lower leg, Left lower leg, Back  Bathing assist Assist Level: (max A)      Upper Body  Dressing/Undressing Upper body dressing   What is the patient wearing?: Hospital gown                Upper body assist Assist Level: (total A)      Lower Body Dressing/Undressing Lower body dressing   What is the patient wearing?: Non-skid slipper socks         Non-skid slipper socks- Performed by patient: Don/doff right sock Non-skid slipper socks- Performed by helper: Don/doff left sock                  Lower body assist Assist for lower body dressing: (mod a)      Naval architect activity did not occur: No continent bowel/bladder event Toileting steps completed by patient: Performs perineal hygiene Toileting steps completed by helper: Adjust clothing prior to toileting, Performs perineal hygiene, Adjust clothing after toileting Toileting Assistive Devices: Grab bar or rail  Toileting assist Assist level: Touching or steadying assistance (Pt.75%)   Transfers Chair/bed transfer   Chair/bed transfer method: Ambulatory Chair/bed transfer assist level: Touching or steadying assistance (Pt > 75%) Chair/bed transfer assistive device: Medical sales representative     Max distance: 75 ft Assist level: Moderate assist (Pt 50 - 74%)   Wheelchair          Cognition Comprehension Comprehension assist level: Understands basic 50 - 74% of the time/ requires cueing 25 - 49% of the time  Expression Expression assist level: Expresses basic 25 - 49% of the time/requires cueing 50 - 75% of the time. Uses single words/gestures.  Social  Interaction Social Interaction assist level: Interacts appropriately 25 - 49% of time - Needs frequent redirection.  Problem Solving Problem solving assist level: Solves basic 25 - 49% of the time - needs direction more than half the time to initiate, plan or complete simple activities  Memory Memory assist level: Recognizes or recalls 25 - 49% of the time/requires cueing 50 - 75% of the time   Medical Problem List and  Plan: 1.Decreased functional mobilitysecondary to right subdural hematoma (MRI brain reviewed)  after fall complicated by acute hypoxic respiratory failure and stridor as well as history of glioblastoma and seizure disorder -continue CIR   -working on initiation/processing   Notes reviewed, images reviewed, labs reviewed 2. DVT Prophylaxis/Anticoagulation/history of heparin-induced thrombocytopenia: SCDs. Arixtra currently on hold due to hematoma 3. Pain Management:Tylenol as needed 4. Mood:Prozac 20 mg daily 5. Neuropsych: This patientisnot fully capable of making decisions on hisown behalf. 6. Skin/Wound Care:Routine skin checks 7. Fluids/Electrolytes/Nutrition:encourage PO 8.Seizure disorder. Vimpat 200 mg twice daily, Keppra 1500 mg twice daily. Follow-up neurology services Dr. Krista Blue as outpatient 9.Dysphagia. remains on Dysphagia #1 thin liquids.  -full supervision with meals 10.Hypothyroidism. Synthroid 11.Hyperlipidemia. Lipitor 12.Leukocytosis. resolved  WBCs 10.3 on 4/9   -recheck next week 13.MRSA PCR screening positive. Maintain contact precautions 14.Constipation. Laxative assistance 15. Hyponatremia  Sodium 133 on 4/9  Continue to monitor   LOS (Days) 4 A FACE TO FACE EVALUATION WAS PERFORMED  Bishoy Cupp Lorie Phenix, MD 04/28/2017 10:01 AM

## 2017-04-28 NOTE — Progress Notes (Signed)
Occupational Therapy Session Note  Patient Details  Name: Glen [name] MRN: [id number] Date of Birth: 1970-06-19  Today's Date: 04/28/2017 OT Individual Time: 8676-1950 OT Individual Time Calculation (min): 60 min   Short Term Goals: Week 1:  OT Short Term Goal 1 (Week 1): Pt will transfer onto toilet with min A overall to increase I with functional transfers.  OT Short Term Goal 2 (Week 1): Pt will don LB clothing items with mod A to decrease level of assistance with self care.  OT Short Term Goal 3 (Week 1): Pt will initiate grooming tasks with mod multimodal cues while seated at sink.   Skilled Therapeutic Interventions/Progress Updates:    Pt greeted supine in bed, asleep, woken with tactile cues. Tx focus on initiation, attention, awareness, balance, and visual scanning during self feeding, bathing, and dressing tasks. Max multimodal cues required for pt to transition EOB for breakfast. Handed him container of maple syrup and he put it in his mouth. Therefore meal setup completed by therapist. Mod A for balance with 2 posterior LOBs and pt able to self correct with increased time and cuing. Mod A stand pivot<w/c from low bed with simple verbal instruction provided close to his ear. With tactile cues alone, he kept attempting to return to bed. Pt able to wash UB provided step by step instruction while w/c level at sink. Sit<stand with Min A and he assisted with perihygiene. Pt then donned his gripper socks utilizing figure 4 position. Unable to motor plan method for donning hospital gown and required [facility] assist. After handwashing, he was escorted to RN station. Left him with staff, safety belt fastened, and chair alarm set.     Therapy Documentation Precautions:  Precautions Precautions: Fall Restrictions Weight Bearing Restrictions: No Pain: No s/s pain during tx  Pain Assessment Pain Scale: 0-10 Pain Score: 0-No pain ADL:     See Function Navigator for Current Functional  Status.   Therapy/Group: Individual Therapy  Carter Kaman A Jamillia Closson 04/28/2017, 12:52 PM

## 2017-04-28 NOTE — Progress Notes (Signed)
Physical Therapy Session Note  Patient Details  Name: Glen [name] MRN: [id number] Date of Birth: April 06, 1970  Today's Date: 04/28/2017 PT Individual Time: 1400-1500 PT Individual Time Calculation (min): 60 min  and Today's Date: 04/28/2017 PT Missed Time: 30 Minutes Missed Time Reason: Patient unwilling to participate;Patient fatigue  Short Term Goals: Week 1:  PT Short Term Goal 1 (Week 1): pt will demo focused attention to functional task x 30 seconds wtih min A PT Short Term Goal 2 (Week 1): pt will perform gait x 50' in controlled environment with min A  Skilled Therapeutic Interventions/Progress Updates:   Pt received in w/c at nurses station and agreeable to therapy, no evidence of pain. Total assist w/c transport to/from gym for time management. Worked on static standing balance while performing UE tasks emphasizing visual scanning of environment, command following, and sequencing. Performed multiple bouts of static stance w/ min assist for balance, however max verbal and tactile cues for initiation and successful completion of task. Ambulated 77' x2 to work on endurance and independence w/ functional activity using RW. Mod assist overall for balance, verbal cues required for safe gait pattern, and safety. Pt's brief noted to have fallen during 2nd bout of gait, indicated he needed to toilet. Transported back to room and performed toilet transfer w/ mod assist, total assist for brief management and pericare. Pt w/ continent void. Attempted to return to w/c for therapy, however pt insistent on going to bed. Refusing further participation stating, "worn out". Ended session in supine, call bell within reach and all needs met. Bed alarm on and cushion mat placed beside bed.   Therapy Documentation Precautions:  Precautions Precautions: Fall Restrictions Weight Bearing Restrictions: No General: PT Amount of Missed Time (min): 30 Minutes PT Missed Treatment Reason: Patient unwilling to  participate;Patient fatigue Vital Signs: Therapy Vitals Temp: 98.6 F (37 C) Temp Source: Oral Pulse Rate: 85 Resp: 17 BP: 112/88 Patient Position (if appropriate): Sitting Oxygen Therapy SpO2: 99 % O2 Device: Room Air Pain: Pain Assessment Pain Score: 0-No pain  See Function Navigator for Current Functional Status.   Therapy/Group: Individual Therapy  Desmin Daleo K Arnette 04/28/2017, 3:57 PM

## 2017-04-28 NOTE — Progress Notes (Signed)
Speech Language Pathology Daily Session Note  Patient Details  Name: Glen [name] MRN: [id number] Date of Birth: 1970/01/22  Today's Date: 04/28/2017 SLP Individual Time: 1100-1210 SLP Individual Time Calculation (min): 70 min  Short Term Goals: Week 1: SLP Short Term Goal 1 (Week 1): Pt will consume dys 1 textures and thin liquids with max assist for use of swallowing precautions and minimal overt s/s of aspiration over 3 consecutive sessions.  SLP Short Term Goal 2 (Week 1): Pt will complete basic, familiar tasks with max assist multimodal cues for task initiation and sequencing.   SLP Short Term Goal 3 (Week 1): Pt will locate items to the left of midline in 50% of opportunities during basic, familiar tasks with max assist multimodal cues.   SLP Short Term Goal 4 (Week 1): Pt will focus his attention to stimulation in 50% of opportunities with max assist multimodal cues.   SLP Short Term Goal 5 (Week 1): Pt will verbalize at the word level to convey needs and wants to caregivers with max assist multimodal cues.    Skilled Therapeutic Interventions: Skilled ST services focused on swallow and cognitive skills. SLP facilitated expressive naming of items from [facility] toolkit, pt demonstrated ability to name at world level 40% with extra time and max A verbal cues and repeat name of objects with 80% accuracy. Pt required max A verbal cues for focused attention and repetition of direction due to hearing impairment. Pt demonstrated ability to left objects slightly left of midline in 60% of opportunities with max A verbal cues. SLP facilitated PO consumption of dys 1 and thin liquids 4 immediate coughs, requiring max A verbal cues to slow rate and to utilize verbal expression of wants/needs. Pt utilizes liquid wash following most solids. Pt demonstrated ability to feed self with set up assistance and repositioning of items to right when neglected on left and demonstrated impulsivity during PO consumption.  Pt required max A verbal cues to initiate basic tasks during PO consumption and naming common items. Pt was returned to nursing station with safety belt on. Recommend to continue skilled ST services.      Function:  Eating Eating   Modified Consistency Diet: Yes Eating Assist Level: Supervision or verbal cues;Set up assist for   Eating Set Up Assist For: Opening containers       Cognition Comprehension Comprehension assist level: Understands basic 50 - 74% of the time/ requires cueing 25 - 49% of the time  Expression   Expression assist level: Expresses basic 25 - 49% of the time/requires cueing 50 - 75% of the time. Uses single words/gestures.  Social Interaction Social Interaction assist level: Interacts appropriately 25 - 49% of time - Needs frequent redirection.  Problem Solving Problem solving assist level: Solves basic 25 - 49% of the time - needs direction more than half the time to initiate, plan or complete simple activities  Memory Memory assist level: Recognizes or recalls 25 - 49% of the time/requires cueing 50 - 75% of the time    Pain Pain Assessment Pain Scale: 0-10 Pain Score: 0-No pain  Therapy/Group: Individual Therapy  Tanaiya Kolarik  [facility] 04/28/2017, 12:24 PM

## 2017-04-29 ENCOUNTER — Inpatient Hospital Stay (HOSPITAL_COMMUNITY): Payer: Medicare Other | Admitting: Occupational Therapy

## 2017-04-29 ENCOUNTER — Inpatient Hospital Stay (HOSPITAL_COMMUNITY): Payer: Medicare Other

## 2017-04-29 NOTE — Progress Notes (Signed)
Social Work Patient ID: Glen [name], male   DOB: [birth year], 47 y.o.   MRN: [id number]   CSW met with pt and his family (mother, father, brother) on 04-26-17 to update them on team conference discussion and pt's estimated length of stay of 3 weeks.  They appreciated the update and that pt will be on CIR for about three weeks.  Pt is very HOH, so it was hard to update him fully, but he seemed to hear that he would be on CIR for 3 weeks.  Pt has multiple family members to help when he is discharged with his mother as the main caregiver.  CSW will continue to follow and assist as needed.

## 2017-04-29 NOTE — Progress Notes (Signed)
Occupational Therapy Session Note  Patient Details  Name: Glen [name] MRN: [id number] Date of Birth: 1970/06/09  Today's Date: 04/29/2017 OT Individual Time: 7588-3254 OT Individual Time Calculation (min): 60 min    Short Term Goals: Week 1:  OT Short Term Goal 1 (Week 1): Pt will transfer onto toilet with min A overall to increase I with functional transfers.  OT Short Term Goal 2 (Week 1): Pt will don LB clothing items with mod A to decrease level of assistance with self care.  OT Short Term Goal 3 (Week 1): Pt will initiate grooming tasks with mod multimodal cues while seated at sink.   Skilled Therapeutic Interventions/Progress Updates:    Pt greeted asleep in enclosure bed, easily woken. Tx focus on dynamic balance, cognitive remediation, and functional transfers during bathing, dressing, and toileting tasks at shower level. All stand pivot transfers completed with Mod A due to ataxia/posterior lean. Once seated on TTB, pt initiated washing 10/11 body areas with extra time. Dysmetric and ataxic UE movements noted while manipulating wash cloth and when placing it under water. After, dressing completed w/c level at sink. He required step by step cues for sequencing/motor planning/attention. Pt responds best to loud 1 step verbal cues spoken beside ear. Had him complete toilet transfer and he voided bladder <30 seconds. He initiated clothing mgt x2. After handwashing with HOH assist, pt was escorted to RN station. Safety belt fastened and chair alarm set.     Pt verbal 5% of time during session.      Therapy Documentation Precautions:  Precautions Precautions: Fall Restrictions Weight Bearing Restrictions: No Pain: Pt shook his head, saying "No pain" when asked   ADL: :    See Function Navigator for Current Functional Status.   Therapy/Group: Individual Therapy  Glen [name] 04/29/2017, 12:53 PM

## 2017-04-29 NOTE — Progress Notes (Signed)
Social Work Assessment and Plan  Patient Details  Name: Glen [name] MRN: [id number] Date of Birth: June 22, 1970  Today's Date: 04/26/2017  Problem List:  Patient Active Problem List   Diagnosis Date Noted  . Hyponatremia   . Dysphagia   . Traumatic subdural hematoma ([place]) 04/24/2017  . Abnormal movements   . Subdural hematoma ([place]) 04/09/2017  . Seizures ([place]) 02/13/2017   Past Medical History:  Past Medical History:  Diagnosis Date  . Brain cancer ([facility])   . Hypercholesteremia   . Seizure ([place])   . Stroke ([place])   . Thyroid disorder    Past Surgical History:  Past Surgical History:  Procedure Laterality Date  . APPENDECTOMY     1980s  . BRAIN SURGERY     1993  . CRANIOTOMY Right 04/09/2017   Procedure: CRANIOTOMY HEMATOMA EVACUATION SUBDURAL;  Surgeon: Erline Levine, MD;  Location: [facility];  Service: Neurosurgery;  Laterality: Right;  . DIRECT LARYNGOSCOPY N/A 04/09/2017   Procedure: DIRECT LARYNGOSCOPY;  Surgeon: Erline Levine, MD;  Location: [place] [facility];  Service: Neurosurgery;  Laterality: N/A;  . DIRECT LARYNGOSCOPY N/A 04/09/2017   Procedure: DIRECT LARYNGOSCOPY;  Surgeon: [name], MD;  Location: [place];  Service: ENT;  Laterality: N/A;  . DIRECT LARYNGOSCOPY N/A 04/18/2017   Procedure: DIRECT LARYNGOSCOPY;  Surgeon: [name], MD;  Location: [place];  Service: ENT;  Laterality: N/A;  . TRACHEOSTOMY TUBE PLACEMENT N/A 04/18/2017   Procedure: TRANSNASAL FIBER OPTIC LARYNGOSCOPY;  Surgeon: [name], MD;  Location: [facility];  Service: ENT;  Laterality: N/A;   Social History:  reports that he has never smoked. He has never used smokeless tobacco. He reports that he does not drink alcohol or use drugs.  Family / Support Systems Marital Status: Divorced Patient Roles: Parent, Other (Comment)(son; brother; friend; uncle) Children: 65 y/o son who pt has not seen in 3 years Other Supports: Hardie Veltre - mother - [phone]; Roger Shelter - sister - [phone] Anticipated Caregiver: Mom, sisters, brother and niece, [place] Ability/Limitations of Caregiver: Mom is 74 years old and can do superivsion - had pacemaker inserted same day as pt's admission to the hospital Caregiver Availability: 24/7 Family Dynamics: close, supportive family  Social History Preferred language: English Religion:  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  Pt has given permissin for his mother and three siblings can receive medication information, as needed.   Abuse/Neglect Abuse/Neglect Assessment Can Be Completed: Unable to assess, patient is non-responsive or altered mental status(CSW will continue to assess.  Pt not able to discuss this at this time, but family seems very loving and supportive.) Physical Abuse: Denies Verbal Abuse: Denies Sexual Abuse: Denies Exploitation of patient/patient's resources: Denies Self-Neglect: Denies  Emotional Status Pt's affect, behavior and adjustment status: Pt is very HOH and it was difficult to ascertain how he is adjusting to everything.  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 Recent Psychosocial Issues: Pt with a 51 y/o son who he has not seen in 3 years.   Psychiatric History: none reported Substance Abuse History: none reported  Patient / Family Perceptions, Expectations & Goals Pt/Family understanding of illness & functional limitations: Family feels they have received the medical  information they need and understand pt's condition. Premorbid pt/family roles/activities: Pt likes to do sticker books, colors, draws, preaches, goes to church, enjoy family time. Anticipated changes in  roles/activities/participation: Pt/family hope pt can resume activities as he can. Pt/family expectations/goals: Pt's family would like for pt to get close to where he was before and they want to get him home and back to church.  Community [name] Agencies: None Premorbid Home Care/DME Agencies: Other (Comment)(has hoverround, old w/c, old hospital bed, old bedside commode, rolling walker.) Transportation available at discharge: family Resource referrals recommended: Neuropsychology, Support group (specify)  Discharge Planning Living Arrangements: Parent, Other relatives Support Systems: Parent, Other relatives, Friends/neighbors, Church/faith community Type of Residence: Private residence(lives in apartment with his parents attached to his sister's home) Insurance Resources: [employer], [employer] (specify county)([place]) Financial Resources: SSD, Family Support, Employment Financial Screen Referred: No Living Expenses: Lives with family Money Management: Family Does the patient have any problems obtaining your medications?: No Home Management: Pt's family takes care of this. Patient/Family Preliminary Plans: Pt's family plans to have pt come home for the to provide 24/7 supervision. Social Work Anticipated Follow Up Needs: HH/OP, Support Group Expected length of stay: 3 weeks  Clinical Impression CSW met with pt and pt's mother, father, and brother to introduce self and role of CSW, as well as to complete assessment.  Pt was alert during visit, but is HOH and it was difficult for him to follow conversation.  Family is very supportive of pt and positive about his progress so far and feel they can provide the level of care he will need at home.  Pt has lots of supportive family members and mother told CSW about how they adopted pt and that he's been a "blessing" to their family.  Family is very committed to pt.  Pt's mother is recovering from having a pacemaker inserted, but she  feels she will be ready for pt to return home in 3 weeks.  Pt has some DME at home that is old and in disrepair, so CSW to find what DME can be replaced.  No current concerns/needs/questions as this time.  CSW will continue to follow and assist as needed.  Mardy Lucier, Silvestre Mesi 04/26/2017, 2:00 PM

## 2017-04-29 NOTE — Progress Notes (Signed)
Social Work Patient ID: Glen [name], male   DOB: 04/26/1970, 47 y.o.   MRN: [id number]     Glen [name], [name]  Social Worker    Patient Care Conference  Signed  Date of Service:  04/26/2017 11:03 AM          Signed           [] Hide copied text  [] Hover for details   Inpatient RehabilitationTeam Conference and Plan of Care Update Date: 04/25/2017   Time: 2:30 PM      Patient Name: Glen [name]      Medical Record Number: [id number]  Date of Birth: [birth year] Sex: Male         Room/Bed: 4W16C/4W16C-01 Payor Info: Payor: MEDICARE / Plan: MEDICARE PART A AND B / Product Type: *No Product type* /     Admitting Diagnosis: SDH's crani  Admit Date/Time:  04/24/2017  5:09 PM Admission Comments: No comment available    Primary Diagnosis:  <principal problem not specified> Principal Problem: <principal problem not specified>       Patient Active Problem List    Diagnosis Date Noted  . Traumatic subdural hematoma ([facility]) 04/24/2017  . Abnormal movements    . Subdural hematoma ([place]) 04/09/2017  . Seizures ([facility]) 02/13/2017      Expected Discharge Date: Expected Discharge Date: (3 wks)   Team Members Present: Physician leading conference: Dr. Alger Simons Social Worker Present: Lennart Pall, LCSW Nurse Present: Junius Creamer, RN PT Present: Roderic Ovens, PT OT Present: Benay Pillow, OT SLP Present: Weston Anna, SLP PPS Coordinator present : Daiva Nakayama, RN, CRRN       Current Status/Progress Goal Weekly Team Focus  Medical     Patient admitted after right subdural hemorrhage.  History significant for chronic seizures and glioblastoma  Maximize arousal and cognition to allow improved functional capabilities  Seizure control, nutrition, wound care management, normalization of sleep and wake cycle   Bowel/Bladder     incontinent of bowel and bladder, LBm 4-5  managed bowel and bladder with mod assist   timed tolieting   Swallow/Nutrition/ Hydration    Dys 1, thin liquids; full supervision, total assist   min assist   toleration of current diet    ADL's     mod A functional transfers with RW, max - total A for all other self care  supervision overall and min A for attention and awareness      Mobility     mod A transfers and gait with RW  supervision for mobility and gait, min A attention and awareness  attention, awareness, functional mobility   Communication     total assist, limited initiation of verbal expression   min assist   verbal expression of immediate needs/wants to caregivers    Safety/Cognition/ Behavioral Observations   total assist  min assist   focused attention, visual scanning, initiation    Pain     no c/o pain  no c/o pain  Assess pain q shift and prn   Skin     healed incision to right side of head  no new skin issues  Assess skin q shift and prn     Rehab Goals Patient on target to meet rehab goals: Yes Rehab Goals Revised: none - pt's first conference on day of eval *See Care Plan and progress notes for long and short-term goals.      Barriers to Discharge   Current Status/Progress Possible Resolutions Date Resolved  Physician     Medical stability        Continue management as outlined in the chart      Nursing                 PT  Decreased caregiver support                 OT Decreased caregiver support  lives with 54 y/o mother and father who is blind             SLP            SW              Discharge Planning/Teaching Needs:  Family plans for pt to return to his home, but CSW needs to confirm the amount of care/supervision family can provide.  Family education offered closer to d/c.   Team Discussion:  Pt just being evaluated today and had a fall due to being able to slip under quick release belt.  Pt with multiple/complex medical issues.  Pt is currently min to mod A with mobility and cueing.  Pt showered today and initiated some with ADLs.  Pt is very HOH and it is unclear how much of this  is affecting his ability to follow cues.  Pt's baseline is uncertain right now.  Pt is on D1 and thin liquids without any s/s of aspiration, but essentially non-verbal during speech eval, but talks more when his family arrived.  Goals are supervision overall.  Revisions to Treatment Plan:  none    Continued Need for Acute Rehabilitation Level of Care: The patient requires daily medical management by a physician with specialized training in physical medicine and rehabilitation for the following conditions: Daily direction of a multidisciplinary physical rehabilitation program to ensure safe treatment while eliciting the highest outcome that is of practical value to the patient.: Yes Daily medical management of patient stability for increased activity during participation in an intensive rehabilitation regime.: Yes Daily analysis of laboratory values and/or radiology reports with any subsequent need for medication adjustment of medical intervention for : Neurological problems;Post surgical problems   Glen [name], Silvestre Mesi 04/27/2017, 11:28 AM

## 2017-04-29 NOTE — Progress Notes (Signed)
Speech Language Pathology Daily Session Note  Patient Details  Name: Glen [name] MRN: [id number] Date of Birth: 1970-11-26  Today's Date: 04/29/2017 SLP Individual Time: 1345-1440 SLP Individual Time Calculation (min): 55 min  Short Term Goals: Week 1: SLP Short Term Goal 1 (Week 1): Pt will consume dys 1 textures and thin liquids with max assist for use of swallowing precautions and minimal overt s/s of aspiration over 3 consecutive sessions.  SLP Short Term Goal 2 (Week 1): Pt will complete basic, familiar tasks with max assist multimodal cues for task initiation and sequencing.   SLP Short Term Goal 3 (Week 1): Pt will locate items to the left of midline in 50% of opportunities during basic, familiar tasks with max assist multimodal cues.   SLP Short Term Goal 4 (Week 1): Pt will focus his attention to stimulation in 50% of opportunities with max assist multimodal cues.   SLP Short Term Goal 5 (Week 1): Pt will verbalize at the word level to convey needs and wants to caregivers with max assist multimodal cues.    Skilled Therapeutic Interventions:Skilled ST services focused on swallow and speech goals. SLP facilitated PO consumption of dys 2 trial and thin via straw, pt demonstrated abnormal oral function, however no overt s/s aspiration. SLP recommends trial tray in future sessions of dys 1. Pt attempted to communicate at phrase level notifing SLP, that he recent ate and was full, stating " not long ago. umm. A. umm man. Umm. Plate . No not plate (gesturing outline of tray). Spoon mouth", however when SLP restated pt's message pt would not respond initially to yes/no question with max A verbal cues. SLP asked pt if he was hungry at the end of session and pt stated " it aint been. Um um.long. Since I last ate" and nodded yes with question prompt when SLP restated message. Pt demonstrated increase ability to spontaneously express wants and needs, although with continued reduced intellgibility  and verbal inconsistent comprehension. SLP facilitated expressive naming of common objects utilizing [employer] toolkit, pt demonstrated increase spontaneous naming with 60% accuracy given extra time, verbal cues were not found to be helpful. SLP facilitated basic problem solving sort up to 6 colors demonstrating Mod I ability, however perseverated on task when asked to copy simple pattern given max A verbal cues. Pt was returned to nursing station with safety belt on. Recommend to continue skilled ST services at this time.      Function:  Eating Eating   Modified Consistency Diet: Yes(dys 2 trial) Eating Assist Level: Supervision or verbal cues;Set up assist for   Eating Set Up Assist For: Opening containers       Cognition Comprehension Comprehension assist level: Understands basic 50 - 74% of the time/ requires cueing 25 - 49% of the time  Expression   Expression assist level: Expresses basic 25 - 49% of the time/requires cueing 50 - 75% of the time. Uses single words/gestures.;Expresses basic 50 - 74% of the time/requires cueing 25 - 49% of the time. Needs to repeat parts of sentences.  Social Interaction Social Interaction assist level: Interacts appropriately 25 - 49% of time - Needs frequent redirection.  Problem Solving Problem solving assist level: Solves basic 25 - 49% of the time - needs direction more than half the time to initiate, plan or complete simple activities;Solves basic 50 - 74% of the time/requires cueing 25 - 49% of the time  Memory Memory assist level: Recognizes or recalls less than 25% of the  time/requires cueing greater than 75% of the time    Pain Pain Assessment Pain Score: 0-No pain  Therapy/Group: Individual Therapy  Tiger Spieker  [facility] 04/29/2017, 2:40 PM

## 2017-04-29 NOTE — Progress Notes (Signed)
Physical Therapy Session Note  Patient Details  Name: Glen [name] MRN: [id number] Date of Birth: [birth year]  Today's Date: 04/29/2017 PT Individual Time: 1545-1600 PT Individual Time Calculation (min): 15 min  and Today's Date: 04/29/2017 PT Missed Time: 60 Minutes Missed Time Reason: Patient unwilling to participate;Patient fatigue  Short Term Goals: Week 1:  PT Short Term Goal 1 (Week 1): pt will demo focused attention to functional task x 30 seconds wtih min A PT Short Term Goal 2 (Week 1): pt will perform gait x 50' in controlled environment with min A  Skilled Therapeutic Interventions/Progress Updates:    Pt supine in bed asleep upon PT arrival, pt arouses to verbal and tactile stimuli. Therapist using gestures and commands to get pt to get up out of bed. Therapist begins moving pts LEs and pt immediately pulls LEs back into bed and groans. Therapist assist pt to sitting EOB with mod assist to move LEs, pt sits EOB and then proceeds to lay back down. Therapist attempts again to get pt to participate in therapy, pt closes eyes. Pt left supine in bed with needs in reach in enclosure bed.  Pt missed 60 minutes of skilled therapy tx, unwilling to participate.   Therapy Documentation Precautions:  Precautions Precautions: Fall Restrictions Weight Bearing Restrictions: No   See Function Navigator for Current Functional Status.   Therapy/Group: Individual Therapy  Netta Corrigan, PT, DPT 04/29/2017, 8:04 AM

## 2017-04-29 NOTE — Progress Notes (Signed)
[facility] PHYSICAL MEDICINE & REHABILITATION     PROGRESS NOTE    Subjective/Complaints: Patient seen lying in bed this morning. No reported issues overnight.  ROS: Limited due to [place], but appears to denies CP, SOB.   Objective: Vital Signs: Blood pressure 115/77, pulse 78, temperature 98.4 F (36.9 C), temperature source Oral, resp. rate 16, weight 86 kg (189 lb 9.5 oz), SpO2 95 %. No results found. No results for input(s): WBC, HGB, HCT, PLT in the last 72 hours. No results for input(s): NA, K, CL, GLUCOSE, BUN, CREATININE, CALCIUM in the last 72 hours.  Invalid input(s): CO CBG (last 3)  No results for input(s): GLUCAP in the last 72 hours.  Wt Readings from Last 3 Encounters:  04/28/17 86 kg (189 lb 9.5 oz)  04/24/17 86.1 kg (189 lb 13.1 oz)  02/13/17 91.2 kg (201 lb)    Physical Exam:    Constitutional: No distress . Vital signs reviewed. HENT: scalp incision C/D/I Eyes: EOMI. No discharge. Cardiovascular: RRR. No JVD    Respiratory: CTA Bilaterally. Normal effort    GI: BS +, non-distended  Neurological. Alert HOH.  Motor: Spontaneously moving all extremities (stable). Skin. Craniotomy sites clean and dry Psych: Flat, limited due to [facility].  Assessment/Plan: 1. Functional and cognitive deficits secondary to SDH which require 3+ hours per day of interdisciplinary therapy in a comprehensive inpatient rehab setting. Physiatrist is providing close team supervision and 24 hour management of active medical problems listed below. Physiatrist and rehab team continue to assess barriers to discharge/monitor patient progress toward functional and medical goals.  Function:  Bathing Bathing position   Position: Wheelchair/chair at sink  Bathing parts Body parts bathed by patient: Chest, Abdomen, Front perineal area, Right arm, Left arm, Right upper leg, Left upper leg Body parts bathed by helper: Buttocks, Right lower leg, Left lower leg, Back   Bathing assist Assist Level: (max A)      Upper Body Dressing/Undressing Upper body dressing   What is the patient wearing?: Hospital gown                Upper body assist Assist Level: (Total A)      Lower Body Dressing/Undressing Lower body dressing   What is the patient wearing?: Non-skid slipper socks         Non-skid slipper socks- Performed by patient: Don/doff right sock, Don/doff left sock Non-skid slipper socks- Performed by helper: Don/doff left sock                  Lower body assist Assist for lower body dressing: Touching or steadying assistance (Pt > 75%)      Toileting Toileting Toileting activity did not occur: No continent bowel/bladder event Toileting steps completed by patient: Performs perineal hygiene Toileting steps completed by helper: Adjust clothing prior to toileting, Performs perineal hygiene, Adjust clothing after toileting Toileting Assistive Devices: Grab bar or rail  Toileting assist Assist level: Two helpers(per Ivery Quale, NT)   Transfers Chair/bed transfer   Chair/bed transfer method: Ambulatory Chair/bed transfer assist level: Moderate assist (Pt 50 - 74%/lift or lower) Chair/bed transfer assistive device: Medical sales representative     Max distance: 84' Assist level: Moderate assist (Pt 50 - 74%)   Wheelchair       Assist Level: Dependent (Pt equals 0%)  Cognition Comprehension Comprehension assist level: Understands basic 25 - 49% of the time/ requires cueing 50 - 75% of the time  Expression Expression assist  level: Expresses basic 25 - 49% of the time/requires cueing 50 - 75% of the time. Uses single words/gestures.  Social Interaction Social Interaction assist level: Interacts appropriately 25 - 49% of time - Needs frequent redirection.  Problem Solving Problem solving assist level: Solves basic 25 - 49% of the time - needs direction more than half the time to initiate, plan or complete simple activities   Memory Memory assist level: Recognizes or recalls less than 25% of the time/requires cueing greater than 75% of the time   Medical Problem List and Plan: 1.Decreased functional mobilitysecondary to right subdural hematoma after fall complicated by acute hypoxic respiratory failure and stridor as well as history of glioblastoma and seizure disorder -continue CIR   -working on initiation/processing 2. DVT Prophylaxis/Anticoagulation/history of heparin-induced thrombocytopenia: SCDs. Arixtra currently on hold due to hematoma 3. Pain Management:Tylenol as needed 4. Mood:Prozac 20 mg daily 5. Neuropsych: This patientisnot fully capable of making decisions on hisown behalf. 6. Skin/Wound Care:Routine skin checks 7. Fluids/Electrolytes/Nutrition:encourage PO 8.Seizure disorder. Vimpat 200 mg twice daily, Keppra 1500 mg twice daily. Follow-up neurology services Dr. Krista Blue as outpatient 9.Dysphagia. remains on Dysphagia #1 thin liquids.  -full supervision with meals 10.Hypothyroidism. Synthroid 11.Hyperlipidemia. Lipitor 12.Leukocytosis. resolved  WBCs 10.3 on 4/9   -recheck next week 13.MRSA PCR screening positive. Maintain contact precautions 14.Constipation. Laxative assistance 15. Hyponatremia  Sodium 133 on 4/9  Recheck labs next week  Continue to monitor   LOS (Days) 5 A FACE TO FACE EVALUATION WAS PERFORMED  Toy Eisemann Lorie Phenix, MD 04/29/2017 7:52 AM

## 2017-04-30 ENCOUNTER — Inpatient Hospital Stay (HOSPITAL_COMMUNITY): Payer: Medicare Other | Admitting: Occupational Therapy

## 2017-04-30 NOTE — Progress Notes (Signed)
Occupational Therapy Session Note  Patient Details  Name: Glen [name] MRN: [id number] Date of Birth: Apr 05, 1970  Today's Date: 04/30/2017 OT Individual Time: 0958-1100 OT Individual Time Calculation (min): 62 min   Short Term Goals: Week 1:  OT Short Term Goal 1 (Week 1): Pt will transfer onto toilet with min A overall to increase I with functional transfers.  OT Short Term Goal 2 (Week 1): Pt will don LB clothing items with mod A to decrease level of assistance with self care.  OT Short Term Goal 3 (Week 1): Pt will initiate grooming tasks with mod multimodal cues while seated at sink.   Skilled Therapeutic Interventions/Progress Updates:    Pt greeted via PT handoff in dayroom. Tx focus on functional transfers, initiation, motor planning, awareness, attention, scanning, and coordination during toileting and self feeding tasks. Once pt was escorted back to room, he ambulated from doorway to toilet with steady assist using RW. Pt initiated clothing mgt x2 and also hygiene after voiding bladder. Max multimodal cues/HOH for sequencing handwashing when standing at sink with RW after. For remainder of session, pt ate breakfast in dayroom, and self propelled there from his doorway for UB strengthening. Max demonstrational/verbal cues for setup of his meal and for opening containers. He was able to scan Lt>Rt for meal items, however had a tendency of placing his used items on Lt side. Max cues for problem solving and often squinted Rt eye when trying to read labels. He required Total A for identifying milk and orange juice. Utensils dropped x3 due to ataxic UE movements. At end of session pt was left at RN station with chair alarm set and safety belt fastened.   Therapy Documentation Precautions:  Precautions Precautions: Fall Restrictions Weight Bearing Restrictions: No Pain: No s/s pain during tx  Pain Assessment Pain Scale: Faces Faces Pain Scale: No hurt ADL:      See Function  Navigator for Current Functional Status.   Therapy/Group: Individual Therapy  Lavenia Stumpo A Yarissa Reining 04/30/2017, 12:31 PM

## 2017-04-30 NOTE — Progress Notes (Addendum)
Physical Therapy Note  Patient Details  Name: BAYANI RENTERIA MRN: [id number] Date of Birth: 15-Jun-1970 Today's Date: 04/30/2017    Time: 855-953 3 minutes  1:1 No signs/symptoms of pain.  Pt in bed and agreeable to therapy.  Pt performs supine to sit with min A, gesturing and tactile cues.  Pt transfers to w/c with min A.  Pt noted to turn to Lt with all transfers this session, confirmed this with OT. ? Visual deficits for Rt vision. Pt performs upper and lower body dressing with total A for clothing, min A for balance. nustep x 8 minutes level 5 for strength and endurance.  Seated and standing horseshoe game with pt able to stand with min A and drop horseshoes onto target.  Pt easily engaged in game and seemed to enjoy.  Pt performs stair negotiation 4 steps x 2 with bilat handrails and min/mod A due to anterior LOB.  Seated tracing task with pt requiring mod cuing for tracing basic shapes. Pt closes Rt eye when attempting to focus vision but unable to describe vision to PT.  Pt handed off to OT at end of session.   Makira Holleman 04/30/2017, 9:53 AM

## 2017-04-30 NOTE — Progress Notes (Signed)
[facility] PHYSICAL MEDICINE & REHABILITATION     PROGRESS NOTE    Subjective/Complaints: Patient seen lying in bed this morning. No reported issues overnight. Patient denies complaints.  ROS: Limited due to [place], but appears to deny CP, SOB.   Objective: Vital Signs: Blood pressure 133/74, pulse 81, temperature 98.4 F (36.9 C), temperature source Oral, resp. rate 18, weight 86 kg (189 lb 9.5 oz), SpO2 96 %. No results found. No results for input(s): WBC, HGB, HCT, PLT in the last 72 hours. No results for input(s): NA, K, CL, GLUCOSE, BUN, CREATININE, CALCIUM in the last 72 hours.  Invalid input(s): CO CBG (last 3)  No results for input(s): GLUCAP in the last 72 hours.  Wt Readings from Last 3 Encounters:  04/28/17 86 kg (189 lb 9.5 oz)  04/24/17 86.1 kg (189 lb 13.1 oz)  02/13/17 91.2 kg (201 lb)    Physical Exam:    Constitutional: No distress . Vital signs reviewed. HENT: scalp incision C/D/I Eyes: EOMI. No discharge. Cardiovascular: RRR. No JVD    Respiratory: CTA Bilaterally. Normal effort    GI: BS +, non-distended  Neurological. Alert HOH.  Motor: Spontaneously moving all extremities (unchanged). Skin. Craniotomy sites clean and dry Psych: Flat, limited due to [facility].  Assessment/Plan: 1. Functional and cognitive deficits secondary to SDH which require 3+ hours per day of interdisciplinary therapy in a comprehensive inpatient rehab setting. Physiatrist is providing close team supervision and 24 hour management of active medical problems listed below. Physiatrist and rehab team continue to assess barriers to discharge/monitor patient progress toward functional and medical goals.  Function:  Bathing Bathing position   Position: Shower  Bathing parts Body parts bathed by patient: Chest, Abdomen, Front perineal area, Right arm, Left arm, Right upper leg, Left upper leg, Buttocks, Right lower leg, Left lower leg Body parts bathed by helper:  Back  Bathing assist Assist Level: Touching or steadying assistance(Pt > 75%)      Upper Body Dressing/Undressing Upper body dressing   What is the patient wearing?: Pull over shirt/dress     Pull over shirt/dress - Perfomed by patient: Thread/unthread right sleeve, Put head through opening Pull over shirt/dress - Perfomed by helper: Thread/unthread left sleeve, Pull shirt over trunk        Upper body assist Assist Level: (Total A)      Lower Body Dressing/Undressing Lower body dressing   What is the patient wearing?: Pants, Non-skid slipper socks     Pants- Performed by patient: Thread/unthread left pants leg, Pull pants up/down Pants- Performed by helper: Thread/unthread right pants leg Non-skid slipper socks- Performed by patient: Don/doff right sock, Don/doff left sock Non-skid slipper socks- Performed by helper: Don/doff left sock                  Lower body assist Assist for lower body dressing: Touching or steadying assistance (Pt > 75%)      Toileting Toileting Toileting activity did not occur: No continent bowel/bladder event Toileting steps completed by patient: Performs perineal hygiene Toileting steps completed by helper: Adjust clothing prior to toileting, Performs perineal hygiene, Adjust clothing after toileting Toileting Assistive Devices: Grab bar or rail  Toileting assist Assist level: Two helpers(per Ivery Quale, NT)   Transfers Chair/bed transfer   Chair/bed transfer method: Ambulatory Chair/bed transfer assist level: Moderate assist (Pt 50 - 74%/lift or lower) Chair/bed transfer assistive device: Medical sales representative     Max distance: 42' Assist level: Moderate  assist (Pt 50 - 74%)   Wheelchair       Assist Level: Dependent (Pt equals 0%)  Cognition Comprehension Comprehension assist level: Understands basic 50 - 74% of the time/ requires cueing 25 - 49% of the time  Expression Expression assist level: Expresses basic 25  - 49% of the time/requires cueing 50 - 75% of the time. Uses single words/gestures.  Social Interaction Social Interaction assist level: Interacts appropriately 25 - 49% of time - Needs frequent redirection.  Problem Solving Problem solving assist level: Solves basic 25 - 49% of the time - needs direction more than half the time to initiate, plan or complete simple activities  Memory Memory assist level: Recognizes or recalls less than 25% of the time/requires cueing greater than 75% of the time   Medical Problem List and Plan: 1.Decreased functional mobilitysecondary to right subdural hematoma after fall complicated by acute hypoxic respiratory failure and stridor as well as history of glioblastoma and seizure disorder -continue CIR   -working on initiation/processing 2. DVT Prophylaxis/Anticoagulation/history of heparin-induced thrombocytopenia: SCDs. Arixtra currently on hold due to hematoma 3. Pain Management:Tylenol as needed 4. Mood:Prozac 20 mg daily 5. Neuropsych: This patientisnot fully capable of making decisions on hisown behalf. 6. Skin/Wound Care:Routine skin checks 7. Fluids/Electrolytes/Nutrition:encourage PO   Labs ordered for tomorrow 8.Seizure disorder. Vimpat 200 mg twice daily, Keppra 1500 mg twice daily. Follow-up neurology services Dr. Krista Blue as outpatient 9.Dysphagia. remains on Dysphagia #1 thin liquids.  -full supervision with meals 10.Hypothyroidism. Synthroid 11.Hyperlipidemia. Lipitor 12.Leukocytosis. resolved  WBCs 10.3 on 4/9   Labs ordered for tomorrow 13.MRSA PCR screening positive. Maintain contact precautions 14.Constipation. Laxative assistance 15. Hyponatremia  Sodium 133 on 4/9  Labs ordered for tomorrow  Continue to monitor   LOS (Days) 6 A FACE TO FACE EVALUATION WAS PERFORMED  Ceaira Ernster Lorie Phenix, MD 04/30/2017 7:20 AM

## 2017-05-01 ENCOUNTER — Inpatient Hospital Stay (HOSPITAL_COMMUNITY): Payer: Medicare Other

## 2017-05-01 ENCOUNTER — Inpatient Hospital Stay (HOSPITAL_COMMUNITY): Payer: Medicare Other | Admitting: Occupational Therapy

## 2017-05-01 ENCOUNTER — Inpatient Hospital Stay (HOSPITAL_COMMUNITY): Payer: Medicare Other | Admitting: Speech Pathology

## 2017-05-01 LAB — CBC WITH DIFFERENTIAL/PLATELET
Basophils Absolute: 0 10*3/uL (ref 0.0–0.1)
Basophils Relative: 0 %
EOS ABS: 0.2 10*3/uL (ref 0.0–0.7)
Eosinophils Relative: 2 %
HEMATOCRIT: 36.3 % — AB (ref 39.0–52.0)
HEMOGLOBIN: 11.9 g/dL — AB (ref 13.0–17.0)
LYMPHS ABS: 2.3 10*3/uL (ref 0.7–4.0)
LYMPHS PCT: 26 %
MCH: 29.3 pg (ref 26.0–34.0)
MCHC: 32.8 g/dL (ref 30.0–36.0)
MCV: 89.4 fL (ref 78.0–100.0)
MONOS PCT: 6 %
Monocytes Absolute: 0.6 10*3/uL (ref 0.1–1.0)
NEUTROS ABS: 5.7 10*3/uL (ref 1.7–7.7)
NEUTROS PCT: 66 %
Platelets: 217 10*3/uL (ref 150–400)
RBC: 4.06 MIL/uL — AB (ref 4.22–5.81)
RDW: 14.2 % (ref 11.5–15.5)
WBC: 8.7 10*3/uL (ref 4.0–10.5)

## 2017-05-01 LAB — BASIC METABOLIC PANEL
ANION GAP: 9 (ref 5–15)
BUN: 7 mg/dL (ref 6–20)
CHLORIDE: 103 mmol/L (ref 101–111)
CO2: 23 mmol/L (ref 22–32)
Calcium: 8.6 mg/dL — ABNORMAL LOW (ref 8.9–10.3)
Creatinine, Ser: 0.91 mg/dL (ref 0.61–1.24)
GFR calc non Af Amer: >60 mL/min (ref >60)
Glucose, Bld: 97 mg/dL (ref 65–99)
POTASSIUM: 3.8 mmol/L (ref 3.5–5.1)
SODIUM: 135 mmol/L (ref 135–145)

## 2017-05-01 NOTE — Progress Notes (Signed)
Physical Therapy Session Note  Patient Details  Name: Glen [name] MRN: [id number] Date of Birth: 07-Jan-1971  Today's Date: 05/01/2017 PT Individual Time: 0915-1011 PT Individual Time Calculation (min): 56 min   Short Term Goals: Week 1:  PT Short Term Goal 1 (Week 1): pt will demo focused attention to functional task x 30 seconds wtih min A PT Short Term Goal 2 (Week 1): pt will perform gait x 50' in controlled environment with min A  Skilled Therapeutic Interventions/Progress Updates:    Pt seated in w/c at RN station upon PT arrival, agreeable to therapy tx and no evidence of pain. Pt transported to dayroom. Pt ambulated x 130 ft with RW and min assist, gestures and verbal cues for turns. Pt used nustep x 5 minutes on workload 5 for global strengthening and reciprocal movement. Pt transferred to w/c stand pivot with min assist and transported to gym. Pt performed stand pivot from w/c<>mat with min assist. Pt worked on standing balance and coordination to toss horseshoes with single UE support on RW. Pt worked on seated balance and coordination in order to toss beach ball back and forth and then kicking ball. Pt ambulated 2 x 70 ft with RW and min assist, verbal cues for increased step length, occasional mod assist for turning to sit on mat. Pt transported back to RN station and left seated in w/c with QRB in place, chair alarm set.   Therapy Documentation Precautions:  Precautions Precautions: Fall Restrictions Weight Bearing Restrictions: No   See Function Navigator for Current Functional Status.   Therapy/Group: Individual Therapy  Netta Corrigan, PT, DPT 05/01/2017, 7:47 AM

## 2017-05-01 NOTE — Progress Notes (Signed)
[facility] PHYSICAL MEDICINE & REHABILITATION     PROGRESS NOTE    Subjective/Complaints: No new complaints.   ROS: Limited due to cognitive/behavioral    Objective: Vital Signs: Blood pressure (!) 97/55, pulse 84, temperature 98.1 F (36.7 C), temperature source Oral, resp. rate 18, weight 86 kg (189 lb 9.5 oz), SpO2 97 %. No results found. Recent Labs    05/01/17 0605  WBC 8.7  HGB 11.9*  HCT 36.3*  PLT 217   Recent Labs    05/01/17 0605  NA 135  K 3.8  CL 103  GLUCOSE 97  BUN 7  CREATININE 0.91  CALCIUM 8.6*   CBG (last 3)  No results for input(s): GLUCAP in the last 72 hours.  Wt Readings from Last 3 Encounters:  04/28/17 86 kg (189 lb 9.5 oz)  04/24/17 86.1 kg (189 lb 13.1 oz)  02/13/17 91.2 kg (201 lb)    Physical Exam:    Constitutional: No distress . Vital signs reviewed. HEENT: EOMI, oral membranes moist Cardiovascular: RRR without murmur. No JVD    Respiratory: CTA Bilaterally without wheezes or rales. Normal effort    GI: BS +, non-tender, non-distended  Neurological. Alert, follows commands HOH.  Motor: Spontaneously moving all extremities (unchanged). Skin. Craniotomy sites clean and dry Psych: Flat but engages  Assessment/Plan: 1. Functional and cognitive deficits secondary to SDH which require 3+ hours per day of interdisciplinary therapy in a comprehensive inpatient rehab setting. Physiatrist is providing close team supervision and 24 hour management of active medical problems listed below. Physiatrist and rehab team continue to assess barriers to discharge/monitor patient progress toward functional and medical goals.  Function:  Bathing Bathing position   Position: Shower  Bathing parts Body parts bathed by patient: Chest, Abdomen, Front perineal area, Right arm, Left arm, Right upper leg, Left upper leg, Buttocks, Right lower leg, Left lower leg Body parts bathed by helper: Right arm, Left arm, Chest, Abdomen, Front perineal  area, Buttocks, Right upper leg, Left upper leg, Right lower leg, Left lower leg, Back  Bathing assist Assist Level: Touching or steadying assistance(Pt > 75%)      Upper Body Dressing/Undressing Upper body dressing   What is the patient wearing?: Pull over shirt/dress     Pull over shirt/dress - Perfomed by patient: Thread/unthread right sleeve, Put head through opening, Thread/unthread left sleeve, Pull shirt over trunk Pull over shirt/dress - Perfomed by helper: Thread/unthread left sleeve, Pull shirt over trunk        Upper body assist Assist Level: Supervision or verbal cues      Lower Body Dressing/Undressing Lower body dressing   What is the patient wearing?: Pants, Non-skid slipper socks     Pants- Performed by patient: Thread/unthread left pants leg, Pull pants up/down, Thread/unthread right pants leg Pants- Performed by helper: Thread/unthread right pants leg Non-skid slipper socks- Performed by patient: Don/doff right sock, Don/doff left sock Non-skid slipper socks- Performed by helper: Don/doff left sock                  Lower body assist Assist for lower body dressing: Touching or steadying assistance (Pt > 75%)      Toileting Toileting Toileting activity did not occur: No continent bowel/bladder event Toileting steps completed by patient: Adjust clothing prior to toileting, Performs perineal hygiene, Adjust clothing after toileting Toileting steps completed by helper: Adjust clothing prior to toileting, Performs perineal hygiene, Adjust clothing after toileting Toileting Assistive Devices: Grab bar or rail  Toileting  assist Assist level: Touching or steadying assistance (Pt.75%)   Transfers Chair/bed transfer   Chair/bed transfer method: Ambulatory Chair/bed transfer assist level: Moderate assist (Pt 50 - 74%/lift or lower) Chair/bed transfer assistive device: Medical sales representative     Max distance: 45' Assist level: Moderate assist (Pt  50 - 74%)   Wheelchair       Assist Level: Dependent (Pt equals 0%)  Cognition Comprehension Comprehension assist level: Understands basic 50 - 74% of the time/ requires cueing 25 - 49% of the time  Expression Expression assist level: Expresses basic 25 - 49% of the time/requires cueing 50 - 75% of the time. Uses single words/gestures.  Social Interaction Social Interaction assist level: Interacts appropriately 25 - 49% of time - Needs frequent redirection.  Problem Solving Problem solving assist level: Solves basic 25 - 49% of the time - needs direction more than half the time to initiate, plan or complete simple activities  Memory Memory assist level: Recognizes or recalls less than 25% of the time/requires cueing greater than 75% of the time   Medical Problem List and Plan: 1.Decreased functional mobilitysecondary to right subdural hematoma after fall complicated by acute hypoxic respiratory failure and stridor as well as history of glioblastoma and seizure disorder -continue CIR   -continue cognitive remediation 2. DVT Prophylaxis/Anticoagulation/history of heparin-induced thrombocytopenia: SCDs. Arixtra currently on hold due to hematoma 3. Pain Management:Tylenol as needed 4. Mood:Prozac 20 mg daily 5. Neuropsych: This patientisnot fully capable of making decisions on hisown behalf. 6. Skin/Wound Care:Routine skin checks 7. Fluids/Electrolytes/Nutrition:encourage PO    -I personally reviewed the patient's labs today.  Normal   -intake is good 8.Seizure disorder. Vimpat 200 mg twice daily, Keppra 1500 mg twice daily. Follow-up neurology services Dr. Krista Blue as outpatient 9.Dysphagia. remains on Dysphagia #1 thin liquids.  -full supervision with meals 10.Hypothyroidism. Synthroid 11.Hyperlipidemia. Lipitor 12.Leukocytosis. resolved  WBCs 8.7 on 4/15     13.MRSA PCR screening positive. Maintain contact  precautions 14.Constipation. Laxative assistance 15. Hyponatremia  Sodium 135 on 4/15     Continue to monitor   LOS (Days) 7 A [place]  Meredith Staggers, MD 05/01/2017 9:21 AM

## 2017-05-01 NOTE — Progress Notes (Signed)
Occupational Therapy Session Note  Patient Details  Name: Glen [name] MRN: [id number] Date of Birth: [birth year]  Today's Date: 05/01/2017 OT Individual Time: 8469-6295 OT Individual Time Calculation (min): 70 min   Short Term Goals: Week 1:  OT Short Term Goal 1 (Week 1): Pt will transfer onto toilet with min A overall to increase I with functional transfers.  OT Short Term Goal 2 (Week 1): Pt will don LB clothing items with mod A to decrease level of assistance with self care.  OT Short Term Goal 3 (Week 1): Pt will initiate grooming tasks with mod multimodal cues while seated at sink.   Skilled Therapeutic Interventions/Progress Updates:    Pt greeted EOB finishing up breakfast with NT present. For remainder of meal, worked on problem solving with opening containers himself. Tx focus on initiation, motor planning, coordination, balance, and functional transfers during bathing, dressing, toileting, and grooming tasks. All functional bathroom transfers completed at ambulatory level using RW with Min-Mod A. Max cues for setup of shower with pt able to sequence washing 10/11 body areas himself. Does require cue to stand for washing buttocks thoroughly. Pt with continent void on toilet after, initiating transfer and clothing mgt. Decreased proprioceptive awareness  LUE with pt hitting limb several times on toilet paper dispenser. Dressing was then completed w/c level at sink with Mod A sit<stand. Max step by step cues required for sequencing/orientation due to apraxia. He initially threaded foot into shirt. HOH for grooming/oral care tasks while seated at sink. Pt continues to tap the faucet with cue to "turn off sink." At end of tx pt was escorted to RN station and left with safety belt fastened and chair alarm set.   Therapy Documentation Precautions:  Precautions Precautions: Fall Restrictions Weight Bearing Restrictions: No Pain: No s/s pain during tx    ADL:    See Function  Navigator for Current Functional Status.   Therapy/Group: Individual Therapy  Darris Staiger A Anvith Mauriello 05/01/2017, 10:37 AM

## 2017-05-01 NOTE — Progress Notes (Signed)
Speech Language Pathology Daily Session Note  Patient Details  Name: Glen [name] MRN: [id number] Date of Birth: 11/09/1970  Today's Date: 05/01/2017 SLP Individual Time: 5056-9794 SLP Individual Time Calculation (min): 60 min  Short Term Goals: Week 1: SLP Short Term Goal 1 (Week 1): Pt will consume dys 1 textures and thin liquids with max assist for use of swallowing precautions and minimal overt s/s of aspiration over 3 consecutive sessions.  SLP Short Term Goal 2 (Week 1): Pt will complete basic, familiar tasks with max assist multimodal cues for task initiation and sequencing.   SLP Short Term Goal 3 (Week 1): Pt will locate items to the left of midline in 50% of opportunities during basic, familiar tasks with max assist multimodal cues.   SLP Short Term Goal 4 (Week 1): Pt will focus his attention to stimulation in 50% of opportunities with max assist multimodal cues.   SLP Short Term Goal 5 (Week 1): Pt will verbalize at the word level to convey needs and wants to caregivers with max assist multimodal cues.    Skilled Therapeutic Interventions: Skilled treatment session focused on dysphagia and cognition goals. SLP facilitated session by providing dysphagia 2 trials with thin liquids. Pt didn't consumed any dysphagia 2 trials d/t "not hungry" but consumed thin liquids without overt s/s of aspiration. Pt required Max A to locate items on left of midline and to locate SLP on his left. Pt able to sustain attention for ~ 15 minutes with Min A cues. Pt answered simple biographical questions with ~ 75% intelligibility at the phrase level with errors of perseveration. Pt was returned to nursing station for increased supervision. Continue per current plan of care.      Function:  Eating Eating   Modified Consistency Diet: No Eating Assist Level: Supervision or verbal cues;Set up assist for           Cognition Comprehension Comprehension assist level: Understands basic 50 - 74% of the  time/ requires cueing 25 - 49% of the time;Understands basic 75 - 89% of the time/ requires cueing 10 - 24% of the time  Expression   Expression assist level: Expresses basic 25 - 49% of the time/requires cueing 50 - 75% of the time. Uses single words/gestures.;Expresses basic 50 - 74% of the time/requires cueing 25 - 49% of the time. Needs to repeat parts of sentences.  Social Interaction Social Interaction assist level: Interacts appropriately 25 - 49% of time - Needs frequent redirection.  Problem Solving Problem solving assist level: Solves basic 25 - 49% of the time - needs direction more than half the time to initiate, plan or complete simple activities;Solves basic 50 - 74% of the time/requires cueing 25 - 49% of the time  Memory Memory assist level: Recognizes or recalls less than 25% of the time/requires cueing greater than 75% of the time    Pain    Therapy/Group: Individual Therapy  [name] 05/01/2017, 3:47 PM

## 2017-05-02 ENCOUNTER — Inpatient Hospital Stay (HOSPITAL_COMMUNITY): Payer: Medicare Other | Admitting: Physical Therapy

## 2017-05-02 ENCOUNTER — Inpatient Hospital Stay (HOSPITAL_COMMUNITY): Payer: Medicare Other | Admitting: Speech Pathology

## 2017-05-02 ENCOUNTER — Inpatient Hospital Stay (HOSPITAL_COMMUNITY): Payer: Medicare Other | Admitting: Occupational Therapy

## 2017-05-02 NOTE — Progress Notes (Signed)
Occupational Therapy Session Note  Patient Details  Name: Glen [name] MRN: [id number] Date of Birth: 1970/02/27  Today's Date: 05/02/2017 OT Individual Time: 0820-0930 OT Individual Time Calculation (min): 70 min    Short Term Goals: Week 1:  OT Short Term Goal 1 (Week 1): Pt will transfer onto toilet with min A overall to increase I with functional transfers.  OT Short Term Goal 2 (Week 1): Pt will don LB clothing items with mod A to decrease level of assistance with self care.  OT Short Term Goal 3 (Week 1): Pt will initiate grooming tasks with mod multimodal cues while seated at sink.   Skilled Therapeutic Interventions/Progress Updates:    Pt seated in wheelchair with NT present and pt eating breakfast. OT assisting pt with opening of containers and pt feeding himself his meal with increased time. Pt transferred from wheelchair >toilet with mod A and use of grab bar. Pt having large BM and requiring assistance with hygiene for thoroughness. Pt voiding urine on the floor with no awareness of actions. Pt returned to wheelchair and seated at table top with focus on initiation with leisure interest. Pt has cut out car models that he has to remove from wooden board. Pt initially needed hand over hand assistance but then initiating task with increased time and mod multimodal cues to get pieces out. Pt remained in wheelchair and left at RN station for safety. Quick release belt donned and chair alarm activated.   Therapy Documentation Precautions:  Precautions Precautions: Fall Restrictions Weight Bearing Restrictions: No   Pain: Pain Assessment Pain Scale: Faces Faces Pain Scale: No hurt  See Function Navigator for Current Functional Status.   Therapy/Group: Individual Therapy  Gypsy Decant 05/02/2017, 11:14 AM

## 2017-05-02 NOTE — Progress Notes (Signed)
Physical Therapy Weekly Progress Note  Patient Details  Name: Glen [name] MRN: [id number] Date of Birth: 03/11/1970  Beginning of progress report period: April 25, 2017 End of progress report period: May 02, 2017  Today's Date: 05/02/2017 PT Individual Time: 1300-1358 PT Individual Time Calculation (min): 58 min   Pt at nurses station, agreeable to therapy.  Pt performs gait with RW throughout unit up to 100' with min A.  Gait without AD with mod/max A x 5'.  Pt able to stand on scale to get weight with min A and min multimodal cuing.  Toilet transfers and gait in bathroom with RW and min A.  Mod multimodal cues for upper body dressing and min A, max A for lower body dressing.  Pt performs nustep x 8 minutes level 5 for mm strength and endurance.  Floor transfer with pt able to perform with mod cuing for safety and min lifting assist.  Pt participates in pushing wooden pieces out of model car with initial mod cuing, progresses to supervision with increased time.  Pt left in w/c at nurses station with needs at hand, alarm set and quick release belt donned.  Patient has met 2 of 2 short term goals.  Pt is improving attention with functional tasks and is able to participate in functional and therapeutic tasks with min A cues for attention. Pt is min/mod A with gait and mobility with RW, max A for balance without AD.  Patient continues to demonstrate the following deficits muscle weakness, unbalanced muscle activation, motor apraxia, decreased coordination and decreased motor planning, decreased attention, decreased awareness, decreased problem solving, decreased safety awareness, decreased memory and delayed processing and decreased standing balance, decreased postural control and decreased balance strategies and therefore will continue to benefit from skilled PT intervention to increase functional independence with mobility.  Patient progressing toward long term goals..  Continue plan of  care.  PT Short Term Goals Week 1:  PT Short Term Goal 1 (Week 1): pt will demo focused attention to functional task x 30 seconds wtih min A PT Short Term Goal 1 - Progress (Week 1): Met PT Short Term Goal 2 (Week 1): pt will perform gait x 50' in controlled environment with min A PT Short Term Goal 2 - Progress (Week 1): Met Week 2:  PT Short Term Goal 1 (Week 2): Pt will perform standing balance for functional task with min A PT Short Term Goal 2 (Week 2): pt will sustain attention to functional task x 1 minute with min cues  Skilled Therapeutic Interventions/Progress Updates:  Ambulation/gait training;Pain management;Stair training;Balance/vestibular training;DME/adaptive equipment instruction;Patient/family education;Therapeutic Activities;Wheelchair propulsion/positioning;Cognitive remediation/compensation;Therapeutic Exercise;Community reintegration;Functional mobility training;UE/LE Strength taining/ROM;Discharge planning;Neuromuscular re-education;Splinting/orthotics;UE/LE Coordination activities   Therapy Documentation Precautions:  Precautions Precautions: Fall Restrictions Weight Bearing Restrictions: No Pain: Pain Assessment Pain Score: 0-No pain  See Function Navigator for Current Functional Status.  Therapy/Group: Individual Therapy  DONAWERTH,KAREN 05/02/2017, 2:01 PM  

## 2017-05-02 NOTE — Progress Notes (Signed)
[facility] PHYSICAL MEDICINE & REHABILITATION     PROGRESS NOTE    Subjective/Complaints: Patient up in chair eating his breakfast.  No new complaints this morning.  ROS: Limited due to cognitive/behavioral   Objective: Vital Signs: Blood pressure 119/80, pulse (!) 57, temperature 99 F (37.2 C), temperature source Oral, resp. rate 18, weight 86 kg (189 lb 9.5 oz), SpO2 96 %. No results found. Recent Labs    05/01/17 0605  WBC 8.7  HGB 11.9*  HCT 36.3*  PLT 217   Recent Labs    05/01/17 0605  NA 135  K 3.8  CL 103  GLUCOSE 97  BUN 7  CREATININE 0.91  CALCIUM 8.6*   CBG (last 3)  No results for input(s): GLUCAP in the last 72 hours.  Wt Readings from Last 3 Encounters:  04/28/17 86 kg (189 lb 9.5 oz)  04/24/17 86.1 kg (189 lb 13.1 oz)  02/13/17 91.2 kg (201 lb)    Physical Exam:    Constitutional: No distress . Vital signs reviewed. HEENT: EOMI, oral membranes moist Cardiovascular: RRR without murmur. No JVD    Respiratory: CTA Bilaterally without wheezes or rales. Normal effort    GI: BS +, non-tender, non-distended   Neurological. Alert, follows simple commands HOH.  Motor: Moves all 4 limbs spontaneously. Skin. Craniotomy sites clean and dry Psych: Flat but engages  Assessment/Plan: 1. Functional and cognitive deficits secondary to SDH which require 3+ hours per day of interdisciplinary therapy in a comprehensive inpatient rehab setting. Physiatrist is providing close team supervision and 24 hour management of active medical problems listed below. Physiatrist and rehab team continue to assess barriers to discharge/monitor patient progress toward functional and medical goals.  Function:  Bathing Bathing position   Position: Shower  Bathing parts Body parts bathed by patient: Chest, Abdomen, Front perineal area, Right arm, Left arm, Right upper leg, Left upper leg, Buttocks, Right lower leg, Left lower leg Body parts bathed by helper: Right arm,  Left arm, Chest, Abdomen, Front perineal area, Buttocks, Right upper leg, Left upper leg, Right lower leg, Left lower leg, Back  Bathing assist Assist Level: Touching or steadying assistance(Pt > 75%)      Upper Body Dressing/Undressing Upper body dressing   What is the patient wearing?: Pull over shirt/dress     Pull over shirt/dress - Perfomed by patient: Thread/unthread right sleeve, Put head through opening, Thread/unthread left sleeve, Pull shirt over trunk Pull over shirt/dress - Perfomed by helper: Thread/unthread left sleeve, Pull shirt over trunk        Upper body assist Assist Level: Supervision or verbal cues      Lower Body Dressing/Undressing Lower body dressing   What is the patient wearing?: Pants, Non-skid slipper socks     Pants- Performed by patient: Thread/unthread left pants leg, Pull pants up/down, Thread/unthread right pants leg Pants- Performed by helper: Thread/unthread right pants leg Non-skid slipper socks- Performed by patient: Don/doff right sock, Don/doff left sock Non-skid slipper socks- Performed by helper: Don/doff left sock                  Lower body assist Assist for lower body dressing: Touching or steadying assistance (Pt > 75%)      Toileting Toileting Toileting activity did not occur: No continent bowel/bladder event Toileting steps completed by patient: Adjust clothing prior to toileting, Performs perineal hygiene, Adjust clothing after toileting Toileting steps completed by helper: Adjust clothing prior to toileting, Performs perineal hygiene, Adjust clothing after  toileting Toileting Assistive Devices: Grab bar or rail  Toileting assist Assist level: Touching or steadying assistance (Pt.75%)   Transfers Chair/bed transfer   Chair/bed transfer method: Ambulatory Chair/bed transfer assist level: Touching or steadying assistance (Pt > 75%) Chair/bed transfer assistive device: Medical sales representative     Max  distance: 130 ft Assist level: Touching or steadying assistance (Pt > 75%)   Wheelchair       Assist Level: Dependent (Pt equals 0%)  Cognition Comprehension Comprehension assist level: Understands basic 50 - 74% of the time/ requires cueing 25 - 49% of the time, Understands basic 75 - 89% of the time/ requires cueing 10 - 24% of the time  Expression Expression assist level: Expresses basic 25 - 49% of the time/requires cueing 50 - 75% of the time. Uses single words/gestures., Expresses basic 50 - 74% of the time/requires cueing 25 - 49% of the time. Needs to repeat parts of sentences.  Social Interaction Social Interaction assist level: Interacts appropriately 25 - 49% of time - Needs frequent redirection.  Problem Solving Problem solving assist level: Solves basic 25 - 49% of the time - needs direction more than half the time to initiate, plan or complete simple activities, Solves basic 50 - 74% of the time/requires cueing 25 - 49% of the time  Memory Memory assist level: Recognizes or recalls less than 25% of the time/requires cueing greater than 75% of the time   Medical Problem List and Plan: 1.Decreased functional mobilitysecondary to right subdural hematoma after fall complicated by acute hypoxic respiratory failure and stridor as well as history of glioblastoma and seizure disorder -continue CIR   -Team conference today 2. DVT Prophylaxis/Anticoagulation/history of heparin-induced thrombocytopenia: SCDs. Arixtra currently on hold due to hematoma 3. Pain Management:Tylenol as needed 4. Mood:Prozac 20 mg daily 5. Neuropsych: This patientisnot fully capable of making decisions on hisown behalf. 6. Skin/Wound Care:Routine skin checks 7. Fluids/Electrolytes/Nutrition:encourage PO    -I personally reviewed the patient's labs today.  Normal   -intake is satisfactory 8.Seizure disorder. Vimpat 200 mg twice daily, Keppra 1500 mg twice daily. Follow-up neurology  services Dr. Krista Blue as outpatient 9.Dysphagia. remains on Dysphagia #1 thin liquids.  -full supervision with meals 10.Hypothyroidism. Synthroid 11.Hyperlipidemia. Lipitor 12.Leukocytosis. resolved  WBCs 8.7 on 4/15     13.MRSA PCR screening positive. Maintain contact precautions 14.Constipation. Laxative assistance 15. Hyponatremia  Sodium 135 on 4/15         LOS (Days) 8 A FACE TO FACE EVALUATION WAS PERFORMED  Meredith Staggers, MD 05/02/2017 9:18 AM

## 2017-05-02 NOTE — Progress Notes (Addendum)
Speech Language Pathology Weekly Progress and Session Note  Patient Details  Name: Glen [name] MRN: [id number] Date of Birth: 09/12/1970  Beginning of progress report period: April 25, 2017 End of progress report period: May 02, 2017  Today's Date: 05/02/2017 SLP Individual Time: 1100-1200 SLP Individual Time Calculation (min): 60 min  Short Term Goals: Week 1: SLP Short Term Goal 1 (Week 1): Pt will consume dys 1 textures and thin liquids with max assist for use of swallowing precautions and minimal overt s/s of aspiration over 3 consecutive sessions.  SLP Short Term Goal 1 - Progress (Week 1): Met SLP Short Term Goal 2 (Week 1): Pt will complete basic, familiar tasks with max assist multimodal cues for task initiation and sequencing.   SLP Short Term Goal 2 - Progress (Week 1): Met SLP Short Term Goal 3 (Week 1): Pt will locate items to the left of midline in 50% of opportunities during basic, familiar tasks with max assist multimodal cues.   SLP Short Term Goal 3 - Progress (Week 1): Met SLP Short Term Goal 4 (Week 1): Pt will focus his attention to stimulation in 50% of opportunities with max assist multimodal cues.   SLP Short Term Goal 4 - Progress (Week 1): Met SLP Short Term Goal 5 (Week 1): Pt will verbalize at the word level to convey needs and wants to caregivers with max assist multimodal cues.   SLP Short Term Goal 5 - Progress (Week 1): Met    New Short Term Goals: Week 2: SLP Short Term Goal 1 (Week 2): Pt will consume dys 1 textures and thin liquids with Mod A verbal cues for use of swallowing precautions and minimal overt s/s of aspiration over 3 consecutive sessions.  SLP Short Term Goal 2 (Week 2): Pt will complete basic, familiar tasks with Mod A multimodal cues for task initiation and sequencing.   SLP Short Term Goal 3 (Week 2): Pt will locate items to the left of midline in 75% of opportunities during basic, familiar tasks with Max A multimodal cues.   SLP  Short Term Goal 4 (Week 2): Pt will sustain attention to functional tasks for ~30 minutes with Max A multimodal cues.   SLP Short Term Goal 5 (Week 2): Pt will verbalize at the word level to convey needs and wants to caregivers with Mod A multimodal cues.    Weekly Progress Updates: Pt has made gains and has met 5 out of 5 STGs this reporting period. Currently, pt requires overall Max A multimodal cues for left attention, initiation, and sequencing during functional tasks. Pt able to verbalize basic wants/needs at the word level with Max A multimodal cues. Pt also consumes dys.1 textures with thin liquids with minimal overt s/s of aspiration and demonstrates use of swallow compensatory strategies with Max A multimodal cues. Patient and family education is ongoing. Patient would benefit from continued skilled SLP intervention to maximize his cognitive-linguistic and swallow function and overall functional independence prior to discharge.      Intensity: Minumum of 1-2 x/day, 30 to 90 minutes Frequency: 3 to 5 out of 7 days Duration/Length of Stay: 05/16/2017 Treatment/Interventions: Cognitive remediation/compensation;Cueing hierarchy;Dysphagia/aspiration precaution training;Functional tasks;Patient/family education;Multimodal communication approach;Internal/external aids;Environmental controls   Daily Session  Skilled Therapeutic Interventions: Skilled treatment session focused on dysphagia and cognitive-linguistic goals. SLP facilitated session by providing Max A multimodal cues for pt to answer basic biographical and orientation questions with ~75% intelligibility at the phrase level; however, only 25% accurate. SLP also   provided skilled observation of pt consuming lunch. Pt consumed dys. 1 textures with immediate cough x1, suspect due to large bolus and impulsivity. Pt also consumed thin liquids via cup and straw without overt s/s of aspiration. Throughout meal, pt demonstrated use of swallow  compensatory strategies (small bites) with Max A multimodal cues. Pt able to attend to left field of environment with Min A verbal cues throughout meal. Pt handed off to NT to finish lunch. Continue with current plan of care.     Function:   Eating Eating   Modified Consistency Diet: Yes Eating Assist Level: Set up assist for;Help managing cup/glass;Supervision or verbal cues;More than reasonable amount of time   Eating Set Up Assist For: Opening containers       Cognition Comprehension Comprehension assist level: Understands basic 50 - 74% of the time/ requires cueing 25 - 49% of the time  Expression   Expression assist level: Expresses basis less than 25% of the time/requires cueing >75% of the time.  Social Interaction Social Interaction assist level: Interacts appropriately 50 - 74% of the time - May be physically or verbally inappropriate.  Problem Solving Problem solving assist level: Solves basic 25 - 49% of the time - needs direction more than half the time to initiate, plan or complete simple activities  Memory Memory assist level: Recognizes or recalls 25 - 49% of the time/requires cueing 50 - 75% of the time     Pain Pain Assessment Pain Score: 0-No pain   Therapy/Group: Individual Therapy     SLP - Student 05/02/2017, 3:17 PM         

## 2017-05-03 ENCOUNTER — Inpatient Hospital Stay (HOSPITAL_COMMUNITY): Payer: Medicare Other

## 2017-05-03 ENCOUNTER — Inpatient Hospital Stay (HOSPITAL_COMMUNITY): Payer: Medicare Other | Admitting: Speech Pathology

## 2017-05-03 ENCOUNTER — Inpatient Hospital Stay (HOSPITAL_COMMUNITY): Payer: Medicare Other | Admitting: Occupational Therapy

## 2017-05-03 NOTE — Progress Notes (Signed)
Physical Therapy Note  Patient Details  Name: ULRIC SALZMAN MRN: [id number] Date of Birth: Nov 08, 1970 Today's Date: 05/03/2017  1510-1610, 60 min individual tx Pain: none demonstrated  Si> stand from wc to RW required 2 tries, due to impulsivity and LOB backwards.  neuromuscular re-education via forced use for alternating reciprocal movement x 4 extremities using NUStep at level 5 x 5 minutes.  Gait training with RW on level tile, max assist initially, due to pt's diaper falling down as he ambulated. Returned to room, and PT donned pt's pants over diaper, in sitting and standing with RW.  Gait training with RW x 50' x 2. Min/mod assist needed once pt was fully standing and understood task.  Transitioning stand> sit required max assist due to pt's poor hearing and inability to understand that w/c is behind him.    Cognitive task seated with bil foot support, using pegs to make lines of different color pegs.  Pt attended to task for 5 minutes; he needed hand over hand assistance to place pegs in holes about 50% of time, due to inaccurate perception of location of hole; pt continually attempted to press peg down, to R of hole.  Pt matched colors 100% accurately with cues initially. 1 peg fell to floor and pt unsafely lunged for it repeatedly; he was unable to grasp it with his R hand.   Pt left resting in w/c, with quick release belt donned, and seat alarm set, at Nurse's station.  Jamison Yuhasz 05/03/2017, 3:10 PM

## 2017-05-03 NOTE — Progress Notes (Signed)
Occupational Therapy Weekly Progress Note  Patient Details  Name: Glen [name] MRN: [id number] Date of Birth: 17-Sep-1970  Beginning of progress report period: April 25, 2017 End of progress report period: May 03, 2017  Today's Date: 05/03/2017 OT Individual Time: 0737-1062 OT Individual Time Calculation (min): 70 min    Patient has met 2 of 3 short term goals.  Pt making steady progress towards occupational therapy goals. Pt currently performing functional transfers with RW with mod A and ambulation with min A. Pt taking shower and initiating bathing body parts with increased time and mod multimodal cues. Pt is feeding self his meal as well. Pt benefit the most from visual and demonstrations gestures secondary to pt being hard of hearing. Pt continues to have limited awareness.   Patient continues to demonstrate the following deficits: muscle weakness, decreased cardiorespiratoy endurance, decreased coordination and decreased motor planning, decreased vision, decreased initiation, decreased attention, decreased awareness, decreased problem solving, decreased safety awareness and decreased memory and decreased sitting balance, decreased standing balance and decreased balance strategies and therefore will continue to benefit from skilled OT intervention to enhance overall performance with BADL.  Patient progressing toward long term goals..  Continue plan of care.  OT Short Term Goals Week 1:  OT Short Term Goal 1 (Week 1): Pt will transfer onto toilet with min A overall to increase I with functional transfers.  OT Short Term Goal 1 - Progress (Week 1): Progressing toward goal OT Short Term Goal 2 (Week 1): Pt will don LB clothing items with mod A to decrease level of assistance with self care.  OT Short Term Goal 2 - Progress (Week 1): Met OT Short Term Goal 3 (Week 1): Pt will initiate grooming tasks with mod multimodal cues while seated at sink.  OT Short Term Goal 3 - Progress (Week 1):  Met Week 2:  OT Short Term Goal 1 (Week 2): Pt will perform shower transfer at min A. OT Short Term Goal 2 (Week 2): Pt will perform toileting at steady assist level for balance during LB dressing and hygiene. OT Short Term Goal 3 (Week 2): Pt will perform toilet transfer with min A and use of RW.   Skilled Therapeutic Interventions/Progress Updates:    Upon entering the room, pt seated on EOB with NT present and finishing breakfast. Pt ambulating into bathroom with min A and use of RW for toileting needs. Pt required mod A for toilet transfer. Pt having large BM and required assistance for hygiene. Pt transferred onto TTB with increased time and mod multimodal cues for technique. Pt bathing while seated on TTB with mod gesturing cues and increased time for initiation. Pt drying self and then standing with steady assistance while therapist assisted pt with donning brief and hospital gown. Pt ambulating to wheelchair and seated at sink for grooming tasks. OT placed toothpaste on brush and placed in hand. Pt taking 2 minutes to initiate but then began brushing his teeth on his own. Pt remaining in wheelchair and assisted to RN station for safety. Quick release belt donned and chair alarm activated.   Therapy Documentation Precautions:  Precautions Precautions: Fall Restrictions Weight Bearing Restrictions: No General:   Vital Signs: Therapy Vitals Temp: 98 F (36.7 C) Temp Source: Oral Pulse Rate: 85 Resp: 18 BP: 120/80 Patient Position (if appropriate): Lying Oxygen Therapy SpO2: 98 % O2 Device: Room Air Pain:   ADL:   Vision   Perception    Praxis   Exercises:  Other Treatments:    See Function Navigator for Current Functional Status.   Therapy/Group: Individual Therapy  Gypsy Decant 05/03/2017, 9:32 AM

## 2017-05-03 NOTE — Progress Notes (Signed)
[place] PHYSICAL MEDICINE & REHABILITATION     PROGRESS NOTE    Subjective/Complaints: Up in his bed. No new complaints. Denies pain.   ROS: Limited due to cognitive/behavioral    Objective: Vital Signs: Blood pressure 120/80, pulse 85, temperature 98 F (36.7 C), temperature source Oral, resp. rate 18, weight 82.2 kg (181 lb 4.8 oz), SpO2 98 %. No results found. Recent Labs    05/01/17 0605  WBC 8.7  HGB 11.9*  HCT 36.3*  PLT 217   Recent Labs    05/01/17 0605  NA 135  K 3.8  CL 103  GLUCOSE 97  BUN 7  CREATININE 0.91  CALCIUM 8.6*   CBG (last 3)  No results for input(s): GLUCAP in the last 72 hours.  Wt Readings from Last 3 Encounters:  05/02/17 82.2 kg (181 lb 4.8 oz)  04/24/17 86.1 kg (189 lb 13.1 oz)  02/13/17 91.2 kg (201 lb)    Physical Exam:    Constitutional: No distress . Vital signs reviewed. HEENT: EOMI, oral membranes moist Cardiovascular: RRR without murmur. No JVD    Respiratory: CTA Bilaterally without wheezes or rales. Normal effort    GI: BS +, non-tender, non-distended  Neurological. Alert, follows simple commands HOH.  Motor: Moves all 4 limbs spontaneously. Skin. Craniotomy sites remain clean and dry Psych: Flat but engages  Assessment/Plan: 1. Functional and cognitive deficits secondary to SDH which require 3+ hours per day of interdisciplinary therapy in a comprehensive inpatient rehab setting. Physiatrist is providing close team supervision and 24 hour management of active medical problems listed below. Physiatrist and rehab team continue to assess barriers to discharge/monitor patient progress toward functional and medical goals.  Function:  Bathing Bathing position   Position: Shower  Bathing parts Body parts bathed by patient: Chest, Abdomen, Front perineal area, Right arm, Left arm, Right upper leg, Left upper leg, Buttocks, Right lower leg, Left lower leg Body parts bathed by helper: Right arm, Left arm, Chest,  Abdomen, Front perineal area, Buttocks, Right upper leg, Left upper leg, Right lower leg, Left lower leg, Back  Bathing assist Assist Level: Touching or steadying assistance(Pt > 75%)      Upper Body Dressing/Undressing Upper body dressing   What is the patient wearing?: Hospital gown     Pull over shirt/dress - Perfomed by patient: Thread/unthread right sleeve, Put head through opening, Thread/unthread left sleeve, Pull shirt over trunk Pull over shirt/dress - Perfomed by helper: Thread/unthread left sleeve, Pull shirt over trunk        Upper body assist Assist Level: Supervision or verbal cues, Set up   Set up : To obtain clothing/put away  Lower Body Dressing/Undressing Lower body dressing   What is the patient wearing?: Non-skid slipper socks     Pants- Performed by patient: Thread/unthread left pants leg, Pull pants up/down, Thread/unthread right pants leg Pants- Performed by helper: Thread/unthread right pants leg Non-skid slipper socks- Performed by patient: Don/doff right sock, Don/doff left sock Non-skid slipper socks- Performed by helper: Don/doff left sock                  Lower body assist Assist for lower body dressing: Touching or steadying assistance (Pt > 75%)      Toileting Toileting Toileting activity did not occur: No continent bowel/bladder event Toileting steps completed by patient: Adjust clothing prior to toileting, Adjust clothing after toileting, Performs perineal hygiene Toileting steps completed by helper: Performs perineal hygiene Toileting Assistive Devices: Grab bar or  rail  Toileting assist Assist level: Touching or steadying assistance (Pt.75%)   Transfers Chair/bed transfer   Chair/bed transfer method: Ambulatory Chair/bed transfer assist level: Touching or steadying assistance (Pt > 75%) Chair/bed transfer assistive device: Medical sales representative     Max distance: 15' Assist level: Touching or steadying assistance (Pt  > 75%)   Wheelchair       Assist Level: Dependent (Pt equals 0%)  Cognition Comprehension Comprehension assist level: Understands basic less than 25% of the time/ requires cueing >75% of the time  Expression Expression assist level: Expresses basic 25 - 49% of the time/requires cueing 50 - 75% of the time. Uses single words/gestures.  Social Interaction Social Interaction assist level: Interacts appropriately 25 - 49% of time - Needs frequent redirection.  Problem Solving Problem solving assist level: Solves basic less than 25% of the time - needs direction nearly all the time or does not effectively solve problems and may need a restraint for safety  Memory Memory assist level: Recognizes or recalls 25 - 49% of the time/requires cueing 50 - 75% of the time   Medical Problem List and Plan: 1.Decreased functional mobilitysecondary to right subdural hematoma after fall complicated by acute hypoxic respiratory failure and stridor as well as history of glioblastoma and seizure disorder -continue CIR-working toward discharge goals, family ed needed  2. DVT Prophylaxis/Anticoagulation/history of heparin-induced thrombocytopenia: SCDs. Arixtra currently on hold due to hematoma 3. Pain Management:Tylenol as needed 4. Mood:Prozac 20 mg daily 5. Neuropsych: This patientisnot fully capable of making decisions on hisown behalf. 6. Skin/Wound Care:Routine skin checks 7. Fluids/Electrolytes/Nutrition:encourage PO    -solid po intake     8.Seizure disorder. Vimpat 200 mg twice daily, Keppra 1500 mg twice daily. Follow-up neurology services Dr. Krista Blue as outpatient 9.Dysphagia. remains on Dysphagia #1 thin liquids.   -may be an opportunity to upgrade diet 10.Hypothyroidism. Synthroid 11.Hyperlipidemia. Lipitor 12.Leukocytosis. resolved  WBCs 8.7 on 4/15     13.MRSA PCR screening positive. Maintain contact precautions 14.Constipation. Laxative  assistance 15. Hyponatremia  Sodium 135 on 4/15---recheck next week         LOS (Days) [place] EVALUATION WAS PERFORMED  Meredith Staggers, MD 05/03/2017 10:38 AM

## 2017-05-03 NOTE — Progress Notes (Signed)
Speech Language Pathology Daily Session Notes  Patient Details  Name: Glen [name] MRN: [id number] Date of Birth: 1970/03/09  Today's Date: 05/03/2017  Session 1: SLP Individual Time: 1130-1200 SLP Individual Time Calculation (min): 30 min   Session 2: SLP Individual Time: 1430-1500 SLP Individual Time Calculation (min): 30 min   Short Term Goals: Week 2: SLP Short Term Goal 1 (Week 2): Pt will consume dys 1 textures and thin liquids with Mod A verbal cues for use of swallowing precautions and minimal overt s/s of aspiration over 3 consecutive sessions.  SLP Short Term Goal 2 (Week 2): Pt will complete basic, familiar tasks with Mod A multimodal cues for task initiation and sequencing.   SLP Short Term Goal 3 (Week 2): Pt will locate items to the left of midline in 75% of opportunities during basic, familiar tasks with Max A multimodal cues.   SLP Short Term Goal 4 (Week 2): Pt will sustain attention to functional tasks for ~30 minutes with Max A multimodal cues.   SLP Short Term Goal 5 (Week 2): Pt will verbalize at the word level to convey needs and wants to caregivers with Mod A multimodal cues.    Skilled Therapeutic Interventions:   Session 1: Skilled treatment session focused on cognitive-linguistic goals. SLP facilitated session by providing Max A faded to [place] A multimodal cues for initiation and sequencing during a calendar making task. Pt able to attend to left field of environment with Min A multimodal cues throughout session. Pt also demonstrated sustained attention for ~30 minutes with Mod A verbal cues. Given visual cues, pt produced automatic sequences 1 - 10 with Mod I; however, required Mod A verbal cues and more than reasonable amount of time to verbalize sequences 11 - 19. Pt left upright in wheelchair at the nurses station and NT made aware. Continue with current plan of care.     Session 2: Skilled treatment session focused on cognitive goals. SLP facilitated session by  providing Supervision verbal cues for initiation and Mod A verbal and visual cues for problem solving during a basic task of interest. Pt able to attend to left field of environment with Supervision verbal cues throughout session. Pt also demonstrated selective attention to task in a mildly distracting environment for ~30 minutes with Supervision verbal cues. Pt left upright in wheelchair at the nurses station. Continue with current plan of care.      Function:  Cognition Comprehension Comprehension assist level: Understands basic less than 25% of the time/ requires cueing >75% of the time  Expression   Expression assist level: Expresses basic 25 - 49% of the time/requires cueing 50 - 75% of the time. Uses single words/gestures.  Social Interaction Social Interaction assist level: Interacts appropriately 25 - 49% of time - Needs frequent redirection.  Problem Solving Problem solving assist level: Solves basic 50 - 74% of the time/requires cueing 25 - 49% of the time  Memory Memory assist level: Recognizes or recalls 25 - 49% of the time/requires cueing 50 - 75% of the time    Pain Pain Assessment Pain Score: 0-No pain  Therapy/Group: Individual Therapy  Meredeth Ide  SLP - Student 05/03/2017, 12:27 PM

## 2017-05-03 NOTE — Plan of Care (Signed)
  Problem: RH COGNITION-NURSING Goal: RH STG USES MEMORY AIDS/STRATEGIES W/ASSIST TO PROBLEM SOLVE Description STG Uses Memory Aids/Strategies With mod Assistance to Problem Solve.  Outcome: Progressing   Problem: RH SAFETY Goal: RH STG DECREASED RISK OF FALL WITH ASSISTANCE Description STG Decreased Risk of Fall With mod Assistance.  Outcome: Progressing   Problem: RH SAFETY Goal: RH STG ADHERE TO SAFETY PRECAUTIONS W/ASSISTANCE/DEVICE Description STG Adhere to Safety Precautions With mod  Assistance/Device.  Outcome: Progressing   Problem: RH BLADDER ELIMINATION Goal: RH STG MANAGE BLADDER WITH ASSISTANCE Description STG Manage Bladder With mod Assistance  Outcome: Progressing   Problem: RH BOWEL ELIMINATION Goal: RH STG MANAGE BOWEL WITH ASSISTANCE Description STG Manage Bowel with mod Assistance.  Outcome: Progressing

## 2017-05-04 ENCOUNTER — Inpatient Hospital Stay (HOSPITAL_COMMUNITY): Payer: Medicare Other | Admitting: Speech Pathology

## 2017-05-04 ENCOUNTER — Inpatient Hospital Stay (HOSPITAL_COMMUNITY): Payer: Medicare Other | Admitting: Physical Therapy

## 2017-05-04 ENCOUNTER — Inpatient Hospital Stay (HOSPITAL_COMMUNITY): Payer: Medicare Other | Admitting: Occupational Therapy

## 2017-05-04 NOTE — Progress Notes (Signed)
Physical Therapy Note  Patient Details  Name: Glen [name] MRN: [id number] Date of Birth: January 12, 1971 Today's Date: 05/04/2017    Time: 1100-1155 55 minutes  1:1 No c/o pain.  Pt performs gait throughout unit 150' x 2, 50' x 1 with RW and min A for steadying, cues for safety and obstacle negotiation with RW.  Pt continues with wide BOS frequently kicking RW with feet, pt's family states that this is consistent with his prior level of function.  Stair negotiation for strengthening 2 x 4 stairs with bilat handrails with min A.  Seated visual scanning and fine motor task with shape matching activity.  Pt able to perform with mod multimodal cuing and increased time.  Pt left in w/c at nurses station with alarm and quick release belt donned.   Glen [name] 05/04/2017, 12:44 PM

## 2017-05-04 NOTE — Patient Care Conference (Signed)
Inpatient RehabilitationTeam Conference and Plan of Care Update Date: 05/02/2017   Time: 2:40 PM    Patient Name: Glen [name]      Medical Record Number: [id number]  Date of Birth: 11/02/1970 Sex: Male         Room/Bed: 4W16C/4W16C-01 Payor Info: Payor: MEDICARE / Plan: MEDICARE PART A AND B / Product Type: *No Product type* /    Admitting Diagnosis: SDH's crani  Admit Date/Time:  04/24/2017  5:09 PM Admission Comments: No comment available   Primary Diagnosis:  <principal problem not specified> Principal Problem: <principal problem not specified>  Patient Active Problem List   Diagnosis Date Noted  . Hyponatremia   . Dysphagia   . Traumatic subdural hematoma ([place]) 04/24/2017  . Abnormal movements   . Subdural hematoma ([place]) 04/09/2017  . Seizures ([place]) 02/13/2017    Expected Discharge Date: Expected Discharge Date: 05/16/17  Team Members Present: Physician leading conference: Dr. Alger Simons Social Worker Present: Lennart Pall, LCSW Nurse Present: Dorthula Nettles, RN PT Present: Roderic Ovens, PT OT Present: Simonne Come, OT SLP Present: Weston Anna, SLP PPS Coordinator present : Daiva Nakayama, RN, CRRN     Current Status/Progress Goal Weekly Team Focus  Medical   Patient making gradual progress.  Very alert.  Still some delays in processing, initiation higher level function.  Nutritionally doing well with good appetite  Improve initiation and cognition  Seizure control, nutritional management   Bowel/Bladder   incont b/b; lbm 4/15  manage bowel and bladder with moderate assistance  timed toileting q2 hours while awake   Swallow/Nutrition/ Hydration   Dys. 1 textures with thin liquids, Full supervision and Mod A for use of swallowing strategies   min assist   Tolerance of current diet, increased use of swallowing strategies, possible trials of upgraded textures    ADL's   Mod A sit<stand bathing at shower level, Supervision UB dressing, Mod A sit<stand LB  dressing, Mod A functional bathroom transfers at ambulatory level using RW  supervision overall and min A for attention and awareness  Cognitive remediation, pt/family education, functional transfers, balance, strengthening    Mobility   min/mod A gait and transfers with RW  supervision for mobility and gait, min A attention and awareness  functional mobility, balance, family ed   Communication   Mod-Max A  min assist   speech intelligibility, initiation of verbal expression for wants/needs   Safety/Cognition/ Behavioral Observations  Max A  Min A  problem solving and attention    Pain   faces=0  faces <2  assess q shift and prn   Skin   incision site to R side of head; OTA: healed; MASD to groin and intergluteal cleft  free from infection/breakdown with mod assist  assess q shift and prn    Rehab Goals Patient on target to meet rehab goals: Yes *See Care Plan and progress notes for long and short-term goals.     Barriers to Discharge  Current Status/Progress Possible Resolutions Date Resolved   Physician    Medical stability        Continue medical management per progress notes      Nursing                  PT                    OT Decreased caregiver support                SLP  SW                Discharge Planning/Teaching Needs:  Plan is for pt to d/c home with family able to provide 24/7 supervision.  Family education offered closer to d/c.   Team Discussion:  ST suspects pt close to baseline for communication.  Making good progress overall.  Currently min/mod assist with mobility.  Has an occasional bladder accident but does well with timed-toiletin.  Setting supervision goals for ADLs and mobility but will need constant cues.  Revisions to Treatment Plan:  None    Continued Need for Acute Rehabilitation Level of Care: The patient requires daily medical management by a physician with specialized training in physical medicine and rehabilitation for the  following conditions: Daily direction of a multidisciplinary physical rehabilitation program to ensure safe treatment while eliciting the highest outcome that is of practical value to the patient.: Yes Daily medical management of patient stability for increased activity during participation in an intensive rehabilitation regime.: Yes Daily analysis of laboratory values and/or radiology reports with any subsequent need for medication adjustment of medical intervention for : Post surgical problems;Neurological problems  Kelisha Dall 05/04/2017, 2:02 PM

## 2017-05-04 NOTE — Progress Notes (Signed)
[name] PHYSICAL MEDICINE & REHABILITATION     PROGRESS NOTE    Subjective/Complaints: In w/c. No new complaints.   ROS: Patient denies fever, rash, sore throat, blurred vision, nausea, vomiting, diarrhea, cough, shortness of breath or chest pain, joint or back pain, headache, or mood change.   Objective: Vital Signs: Blood pressure (!) 78/65, pulse 75, temperature 98.2 F (36.8 C), temperature source Oral, resp. rate 18, weight 82.2 kg (181 lb 4.8 oz), SpO2 96 %. No results found. No results for input(s): WBC, HGB, HCT, PLT in the last 72 hours. No results for input(s): NA, K, CL, GLUCOSE, BUN, CREATININE, CALCIUM in the last 72 hours.  Invalid input(s): CO CBG (last 3)  No results for input(s): GLUCAP in the last 72 hours.  Wt Readings from Last 3 Encounters:  05/02/17 82.2 kg (181 lb 4.8 oz)  04/24/17 86.1 kg (189 lb 13.1 oz)  02/13/17 91.2 kg (201 lb)    Physical Exam:    Constitutional: No distress . Vital signs reviewed. HEENT: EOMI, oral membranes moist Cardiovascular: RRR without murmur. No JVD    Respiratory: CTA Bilaterally without wheezes or rales. Normal effort    GI: BS +, non-tender, non-distended  Neurological. Alert, follows simple commands HOH.  Motor: Moves all 4 limbs spontaneously. Skin. Craniotomy sites are clean and dry Psych: Flat but engages  Assessment/Plan: 1. Functional and cognitive deficits secondary to SDH which require 3+ hours per day of interdisciplinary therapy in a comprehensive inpatient rehab setting. Physiatrist is providing close team supervision and 24 hour management of active medical problems listed below. Physiatrist and rehab team continue to assess barriers to discharge/monitor patient progress toward functional and medical goals.  Function:  Bathing Bathing position   Position: Shower  Bathing parts Body parts bathed by patient: Chest, Abdomen, Front perineal area, Right arm, Left arm, Right upper leg, Left upper  leg, Buttocks, Right lower leg, Left lower leg Body parts bathed by helper: Right arm, Left arm, Chest, Abdomen, Front perineal area, Buttocks, Right upper leg, Left upper leg, Right lower leg, Left lower leg, Back  Bathing assist Assist Level: Touching or steadying assistance(Pt > 75%)      Upper Body Dressing/Undressing Upper body dressing   What is the patient wearing?: Hospital gown     Pull over shirt/dress - Perfomed by patient: Thread/unthread right sleeve, Put head through opening, Thread/unthread left sleeve, Pull shirt over trunk Pull over shirt/dress - Perfomed by helper: Thread/unthread left sleeve, Pull shirt over trunk        Upper body assist Assist Level: Supervision or verbal cues, Set up   Set up : To obtain clothing/put away  Lower Body Dressing/Undressing Lower body dressing   What is the patient wearing?: Non-skid slipper socks     Pants- Performed by patient: Thread/unthread left pants leg, Pull pants up/down, Thread/unthread right pants leg Pants- Performed by helper: Thread/unthread right pants leg Non-skid slipper socks- Performed by patient: Don/doff right sock, Don/doff left sock Non-skid slipper socks- Performed by helper: Don/doff left sock                  Lower body assist Assist for lower body dressing: Touching or steadying assistance (Pt > 75%)      Toileting Toileting Toileting activity did not occur: No continent bowel/bladder event Toileting steps completed by patient: Adjust clothing prior to toileting, Adjust clothing after toileting, Performs perineal hygiene Toileting steps completed by helper: Performs perineal hygiene Toileting Assistive Devices: Grab bar or  rail  Toileting assist Assist level: Touching or steadying assistance (Pt.75%)   Transfers Chair/bed transfer   Chair/bed transfer method: Squat pivot Chair/bed transfer assist level: Touching or steadying assistance (Pt > 75%) Chair/bed transfer assistive device:  Armrests     Locomotion Ambulation     Max distance: 50 Assist level: Maximal assist (Pt 25 - 49%)   Wheelchair       Assist Level: Dependent (Pt equals 0%)  Cognition Comprehension Comprehension assist level: Understands basic 25 - 49% of the time/ requires cueing 50 - 75% of the time  Expression Expression assist level: Expresses basic 25 - 49% of the time/requires cueing 50 - 75% of the time. Uses single words/gestures.  Social Interaction Social Interaction assist level: Interacts appropriately 50 - 74% of the time - May be physically or verbally inappropriate.  Problem Solving Problem solving assist level: Solves basic 25 - 49% of the time - needs direction more than half the time to initiate, plan or complete simple activities  Memory Memory assist level: Recognizes or recalls 25 - 49% of the time/requires cueing 50 - 75% of the time   Medical Problem List and Plan: 1.Decreased functional mobilitysecondary to right subdural hematoma after fall complicated by acute hypoxic respiratory failure and stridor as well as history of glioblastoma and seizure disorder -CIR PT, OT, SLP  2. DVT Prophylaxis/Anticoagulation/history of heparin-induced thrombocytopenia: SCDs. Arixtra currently on hold due to hematoma 3. Pain Management:Tylenol as needed 4. Mood:Prozac 20 mg daily 5. Neuropsych: This patientisnot fully capable of making decisions on hisown behalf. 6. Skin/Wound Care:Routine skin checks 7. Fluids/Electrolytes/Nutrition:encourage PO    -good po intake     8.Seizure disorder. Vimpat 200 mg twice daily, Keppra 1500 mg twice daily. Follow-up neurology services Dr. Krista Blue as outpatient 9.Dysphagia. remains on Dysphagia #1 thin liquids.   -may be an opportunity to upgrade diet 10.Hypothyroidism. Synthroid 11.Hyperlipidemia. Lipitor 12.Leukocytosis. resolved  WBCs 8.7 on 4/15     13.MRSA PCR screening positive. Maintain contact  precautions 14.Constipation. Laxative assistance 15. Hyponatremia  Sodium 135 on 4/15---recheck next week         LOS (Days) [place]  Meredith Staggers, MD 05/04/2017 9:22 AM

## 2017-05-04 NOTE — Progress Notes (Signed)
Occupational Therapy Session Note  Patient Details  Name: Glen [name] MRN: [id number] Date of Birth: 20-Jul-1970  Today's Date: 05/04/2017 OT Individual Time: 0945-1100 OT Individual Time Calculation (min): 75 min    Short Term Goals: Week 2:  OT Short Term Goal 1 (Week 2): Pt will perform shower transfer at min A. OT Short Term Goal 2 (Week 2): Pt will perform toileting at steady assist level for balance during LB dressing and hygiene. OT Short Term Goal 3 (Week 2): Pt will perform toilet transfer with min A and use of RW.   Skilled Therapeutic Interventions/Progress Updates:    Pt seen this session to facilitate motor planning, L visual field awareness, proprioception and L motor control.  Pt received sleeping in bed but awoke easily. He sat to EOB with S and then used RW to ambulate to sink to brush teeth and then to toilet with min-mod A for guidance and turning of RW. Pt did not need to toilet but doffed clothing from sitting to prepare for the shower.  He had a great deal of difficulty with motor planning with L hand placement as he would continually try to grab a bar on the wall vs his walker when he was turning with the RW.  Pt needed max cues for turning around to get into shower.  Once in shower, he had excellent initiation and use of LUE. Pt does much better with closed chain activities.   He returned to w/c to dress. He demonstrated left inattention and spatial relations deficits, L/R impairment so he needed max cues and set up orientation with his clothing.  Pt was able to don socks without any cues, just min A to fully adjust L right sock once on his foot.  Donned shoes with min A.   Pt was then taken to gym to work on focused L visual scanning and L eye hand coordination with picking up and placing beanbags in colored categories.  Pt had to have his R hand "held" down to not use it.  With several attempts pt was then able to do task smoothly. B arm integration with lifting a 3 lb  dowel bar over his head for 10x 2 sets and then using it for a ball toss activity to hit at ball tossed at him.  Initially pt needed max cues and guidance, but then he was able to engage his L arm well and use it in both activities.  Pt's PT arrived for his next session.    Therapy Documentation Precautions:  Precautions Precautions: Fall Restrictions Weight Bearing Restrictions: No     Pain: Pain Assessment Pain Scale: 0-10 Pain Score: 0-No pain Faces Pain Scale: No hurt ADL:    See Function Navigator for Current Functional Status.   Therapy/Group: Individual Therapy  [place] 05/04/2017, 12:31 PM

## 2017-05-04 NOTE — Progress Notes (Signed)
Social Work Patient ID: Glen [name], male   DOB: 12-23-1970, 47 y.o.   MRN: [id number]  Left VM yesterday for pt's mother informing of targeted d/c date of 4/30 and targeted supervision goals.  Glen Baller Prevatt, LCSW will follow up upon her return tomorrow.  [name], LCSW

## 2017-05-04 NOTE — Progress Notes (Signed)
Speech Language Pathology Daily Session Note  Patient Details  Name: Glen [name] MRN: [id number] Date of Birth: 21-May-1970  Today's Date: 05/04/2017 SLP Individual Time: 0730-0830 SLP Individual Time Calculation (min): 60 min  Short Term Goals: Week 2: SLP Short Term Goal 1 (Week 2): Pt will consume dys 1 textures and thin liquids with Mod A verbal cues for use of swallowing precautions and minimal overt s/s of aspiration over 3 consecutive sessions.  SLP Short Term Goal 2 (Week 2): Pt will complete basic, familiar tasks with Mod A multimodal cues for task initiation and sequencing.   SLP Short Term Goal 3 (Week 2): Pt will locate items to the left of midline in 75% of opportunities during basic, familiar tasks with Max A multimodal cues.   SLP Short Term Goal 4 (Week 2): Pt will sustain attention to functional tasks for ~30 minutes with Max A multimodal cues.   SLP Short Term Goal 5 (Week 2): Pt will verbalize at the word level to convey needs and wants to caregivers with Mod A multimodal cues.    Skilled Therapeutic Interventions: Skilled treatment session focused on dysphagia and cognitive goals. Upon arrival, pt was asleep while in bed, but was easily awakened. SLP provided Supervision verbal cues for problem solving in regards to donning pants and transferring to wheelchair using RW. SLP also provided skilled observation of pt consuming breakfast. Pt consumed dys. 1 textures with thin liquids via cup without overt s/s of aspiration and demonstrated use of small bites and slow rate with Min A verbal cues. Pt administered trials of dys. 2 textures, no overt s/s of aspiration noted. Pt demonstrated complete oral clearance; however, prolonged mastication was noted. Therefore, recommend trial tray of dys. 2 textures with SLP prior to upgrade. Throughout session, pt able to attend to left field of environment with Supervision verbal cues. Pt handed off to NT to finish breakfast. Continue with  current plan of care.      Function:  Eating Eating   Modified Consistency Diet: Yes Eating Assist Level: Set up assist for;Help managing cup/glass;Supervision or verbal cues;More than reasonable amount of time   Eating Set Up Assist For: Opening containers       Cognition Comprehension Comprehension assist level: Understands basic 25 - 49% of the time/ requires cueing 50 - 75% of the time  Expression   Expression assist level: Expresses basic 25 - 49% of the time/requires cueing 50 - 75% of the time. Uses single words/gestures.  Social Interaction Social Interaction assist level: Interacts appropriately 50 - 74% of the time - May be physically or verbally inappropriate.  Problem Solving Problem solving assist level: Solves basic 25 - 49% of the time - needs direction more than half the time to initiate, plan or complete simple activities  Memory Memory assist level: Recognizes or recalls 25 - 49% of the time/requires cueing 50 - 75% of the time    Pain Pain Assessment Pain Score: 0-No pain  Therapy/Group: Individual Therapy  Meredeth Ide  SLP - Student 05/04/2017, 8:31 AM

## 2017-05-04 NOTE — Plan of Care (Signed)
  Problem: RH BOWEL ELIMINATION Goal: RH STG MANAGE BOWEL WITH ASSISTANCE Description STG Manage Bowel with mod Assistance.  Outcome: Progressing   Problem: RH BLADDER ELIMINATION Goal: RH STG MANAGE BLADDER WITH ASSISTANCE Description STG Manage Bladder With mod Assistance  Outcome: Progressing   Problem: RH SAFETY Goal: RH STG ADHERE TO SAFETY PRECAUTIONS W/ASSISTANCE/DEVICE Description STG Adhere to Safety Precautions With mod  Assistance/Device.  Outcome: Progressing Goal: RH STG DECREASED RISK OF FALL WITH ASSISTANCE Description STG Decreased Risk of Fall With mod Assistance.  Outcome: Progressing   Problem: RH COGNITION-NURSING Goal: RH STG USES MEMORY AIDS/STRATEGIES W/ASSIST TO PROBLEM SOLVE Description STG Uses Memory Aids/Strategies With mod Assistance to Problem Solve.  Outcome: Progressing

## 2017-05-05 ENCOUNTER — Inpatient Hospital Stay (HOSPITAL_COMMUNITY): Payer: Medicare Other | Admitting: Speech Pathology

## 2017-05-05 ENCOUNTER — Inpatient Hospital Stay (HOSPITAL_COMMUNITY): Payer: Medicare Other

## 2017-05-05 ENCOUNTER — Inpatient Hospital Stay (HOSPITAL_COMMUNITY): Payer: Medicare Other | Admitting: Occupational Therapy

## 2017-05-05 ENCOUNTER — Inpatient Hospital Stay (HOSPITAL_COMMUNITY): Payer: Medicare Other | Admitting: Physical Therapy

## 2017-05-05 NOTE — Progress Notes (Addendum)
Occupational Therapy Session Note  Patient Details  Name: Glen [name] MRN: [id number] Date of Birth: July 21, 1970  Today's Date: 05/05/2017 OT Individual Time: 1040-1100 OT Individual Time Calculation (min): 20 min missed 10 minutes of the session due to schedule conflict   Skilled Therapeutic Interventions/Progress Updates: Patient skill OT session today focused mainly on cognitive skills to increaseindependence with self care, skills focused on included motor planning, initiation, sequencing while completing grooming tasks of oral care and washing face.   Patient appeared hard of hearing and with perhaps comprehension deficits as evidenced  By wither not following instructions or frequently asking, "What" and then when this clinician spoke one step commands directly into patient ears or when gave simple hand over hand ttactile cue, patient attempted to follow through with tasks, even if he was inaccurate or lacked precision for the movement.  He required max verbal and/or tactile (hand over hand assist) for oral care and for washing his face.      Patient handed off to SLP and her student at the conclusion of the session.     Therapy Documentation Precautions:  Precautions Precautions: Fall Restrictions Weight Bearing Restrictions: No Pain: Pain Assessment Pain Score: 0-No pain  See Function Navigator for Current Functional Status.   Therapy/Group: Individual Therapy  Alfredia Ferguson [facility] 05/05/2017, 12:27 PM

## 2017-05-05 NOTE — Plan of Care (Signed)
  Problem: RH BOWEL ELIMINATION Goal: RH STG MANAGE BOWEL WITH ASSISTANCE Description STG Manage Bowel with mod Assistance.  Outcome: Progressing   Problem: RH BLADDER ELIMINATION Goal: RH STG MANAGE BLADDER WITH ASSISTANCE Description STG Manage Bladder With mod Assistance  Outcome: Progressing   Problem: RH SAFETY Goal: RH STG ADHERE TO SAFETY PRECAUTIONS W/ASSISTANCE/DEVICE Description STG Adhere to Safety Precautions With mod  Assistance/Device.  Outcome: Progressing Goal: RH STG DECREASED RISK OF FALL WITH ASSISTANCE Description STG Decreased Risk of Fall With mod Assistance.  Outcome: Progressing   Problem: RH COGNITION-NURSING Goal: RH STG USES MEMORY AIDS/STRATEGIES W/ASSIST TO PROBLEM SOLVE Description STG Uses Memory Aids/Strategies With mod Assistance to Problem Solve.  Outcome: Progressing

## 2017-05-05 NOTE — Progress Notes (Signed)
Physical Therapy Session Note  Patient Details  Name: [name] MRN: [id number] Date of Birth: 14-Aug-1970  Today's Date: 05/05/2017 PT Individual Time: 5701-7793 PT Individual Time Calculation (min): 45 min   Short Term Goals: Week 2:  PT Short Term Goal 1 (Week 2): Pt will perform standing balance for functional task with min A PT Short Term Goal 2 (Week 2): pt will sustain attention to functional task x 1 minute with min cues  Skilled Therapeutic Interventions/Progress Updates:    Pt seated in w/c in room starting breakfast with NA. Pt agreeable to engage in therapy session and has no complaints of pain. Assisted pt with eating breakfast with minimal verbal cues for small bites and to have a drink after every bite as well as ensure every bite is swallowed before starting the next one. Pt engages in BUE fine motor coordination by grasping spoon and scooping food as well as reaching for Ensure bottle and cup of coffee to drink. Pt requires minimal assist to prevent cup from tipping over. Pt exhibits decreased BUE coordination and frequently drops spoon, needs verbal cues to problem solve how to retrieve spoon from his lap. Pt left seated in w/c at nurse's station with quick release belt and chair alarm in place, set up with coloring book.  Therapy Documentation Precautions:  Precautions Precautions: Fall Restrictions Weight Bearing Restrictions: No  See Function Navigator for Current Functional Status.   Therapy/Group: Individual Therapy  Excell Seltzer, PT, DPT  05/05/2017, 12:00 PM

## 2017-05-05 NOTE — Progress Notes (Signed)
[place] PHYSICAL MEDICINE & REHABILITATION     PROGRESS NOTE    Subjective/Complaints: Up at nurses station. No new complaints. More mobile, some safety concerns re: falling and decision making  ROS: Patient denies fever, rash, sore throat, blurred vision, nausea, vomiting, diarrhea, cough, shortness of breath or chest pain, joint or back pain, headache, or mood change.   Objective: Vital Signs: Blood pressure 117/77, pulse 79, temperature 98.1 F (36.7 C), temperature source Oral, resp. rate 18, weight 82.1 kg (181 lb), SpO2 100 %. No results found. No results for input(s): WBC, HGB, HCT, PLT in the last 72 hours. No results for input(s): NA, K, CL, GLUCOSE, BUN, CREATININE, CALCIUM in the last 72 hours.  Invalid input(s): CO CBG (last 3)  No results for input(s): GLUCAP in the last 72 hours.  Wt Readings from Last 3 Encounters:  05/05/17 82.1 kg (181 lb)  04/24/17 86.1 kg (189 lb 13.1 oz)  02/13/17 91.2 kg (201 lb)    Physical Exam:    Constitutional: No distress . Vital signs reviewed. HEENT: EOMI, oral membranes moist Cardiovascular: RRR without murmur. No JVD    Respiratory: CTA Bilaterally without wheezes or rales. Normal effort    GI: BS +, non-tender, non-distended  Neurological. Alert, follows simple commands HOH.  Motor: Moves all 4 limbs at least 4- to 4/5. Skin. Craniotomy sites are clean and dry, Psych: engages but distracted  Assessment/Plan: 1. Functional and cognitive deficits secondary to SDH which require 3+ hours per day of interdisciplinary therapy in a comprehensive inpatient rehab setting. Physiatrist is providing close team supervision and 24 hour management of active medical problems listed below. Physiatrist and rehab team continue to assess barriers to discharge/monitor patient progress toward functional and medical goals.  Function:  Bathing Bathing position   Position: Shower  Bathing parts Body parts bathed by patient: Chest,  Abdomen, Front perineal area, Right arm, Left arm, Right upper leg, Left upper leg, Buttocks, Right lower leg, Left lower leg Body parts bathed by helper: Back  Bathing assist Assist Level: Touching or steadying assistance(Pt > 75%)      Upper Body Dressing/Undressing Upper body dressing   What is the patient wearing?: Pull over shirt/dress     Pull over shirt/dress - Perfomed by patient: Thread/unthread right sleeve, Put head through opening, Thread/unthread left sleeve, Pull shirt over trunk(MAX cues and set up) Pull over shirt/dress - Perfomed by helper: Thread/unthread left sleeve, Pull shirt over trunk        Upper body assist Assist Level: Supervision or verbal cues, Set up   Set up : To obtain clothing/put away  Lower Body Dressing/Undressing Lower body dressing   What is the patient wearing?: Pants, Socks, Shoes     Pants- Performed by patient: Thread/unthread left pants leg, Pull pants up/down, Thread/unthread right pants leg(MAX cues to don over feet) Pants- Performed by helper: Thread/unthread right pants leg Non-skid slipper socks- Performed by patient: Don/doff right sock, Don/doff left sock Non-skid slipper socks- Performed by helper: Don/doff left sock Socks - Performed by patient: Don/doff right sock, Don/doff left sock(no cues!)   Shoes - Performed by patient: Don/doff right shoe, Don/doff left shoe Shoes - Performed by helper: Fasten right, Fasten left          Lower body assist Assist for lower body dressing: Supervision or verbal cues, Touching or steadying assistance (Pt > 75%)      Toileting Toileting Toileting activity did not occur: No continent bowel/bladder event Toileting steps completed  by patient: Adjust clothing prior to toileting, Adjust clothing after toileting Toileting steps completed by helper: Performs perineal hygiene Toileting Assistive Devices: Grab bar or rail  Toileting assist Assist level: Touching or steadying assistance (Pt.75%)    Transfers Chair/bed transfer   Chair/bed transfer method: Ambulatory Chair/bed transfer assist level: Touching or steadying assistance (Pt > 75%) Chair/bed transfer assistive device: Armrests     Locomotion Ambulation     Max distance: 50 Assist level: Maximal assist (Pt 25 - 49%)   Wheelchair       Assist Level: Dependent (Pt equals 0%)  Cognition Comprehension Comprehension assist level: Understands basic 25 - 49% of the time/ requires cueing 50 - 75% of the time  Expression Expression assist level: Expresses basic 25 - 49% of the time/requires cueing 50 - 75% of the time. Uses single words/gestures.  Social Interaction Social Interaction assist level: Interacts appropriately 50 - 74% of the time - May be physically or verbally inappropriate.  Problem Solving Problem solving assist level: Solves basic 25 - 49% of the time - needs direction more than half the time to initiate, plan or complete simple activities  Memory Memory assist level: Recognizes or recalls 25 - 49% of the time/requires cueing 50 - 75% of the time   Medical Problem List and Plan: 1.Decreased functional mobilitysecondary to right subdural hematoma after fall complicated by acute hypoxic respiratory failure and stridor as well as history of glioblastoma and seizure disorder -CIR PT, OT, SLP ---working toward supervision goals 2. DVT Prophylaxis/Anticoagulation/history of heparin-induced thrombocytopenia: SCDs. Arixtra currently on hold due to hematoma 3. Pain Management:Tylenol as needed 4. Mood:Prozac 20 mg daily 5. Neuropsych: This patientisnot fully capable of making decisions on hisown behalf.   -enclosure bed for patient safety 6. Skin/Wound Care:Routine skin checks 7. Fluids/Electrolytes/Nutrition:encourage PO    -good po intake     8.Seizure disorder. Vimpat 200 mg twice daily, Keppra 1500 mg twice daily. Follow-up neurology services Dr. Krista Blue as outpatient 9.Dysphagia.  remains on Dysphagia #1 thin liquids.   -may be an opportunity to upgrade diet 10.Hypothyroidism. Synthroid 11.Hyperlipidemia. Lipitor 12.Leukocytosis. resolved  WBCs 8.7 on 4/15     13.MRSA PCR screening positive. Maintain contact precautions 14.Constipation. Laxative assistance 15. Hyponatremia  Sodium 135 on 4/15---recheck Monday         LOS (Days) 11 A FACE TO FACE EVALUATION WAS PERFORMED  Meredith Staggers, MD 05/05/2017 10:57 AM

## 2017-05-05 NOTE — Progress Notes (Signed)
Occupational Therapy Session Note  Patient Details  Name: Glen [name] MRN: [id number] Date of Birth: August 24, 1970  Today's Date: 05/05/2017 OT Individual Time: 1300-1411 OT Individual Time Calculation (min): 71 min    Short Term Goals: Week 1:  OT Short Term Goal 1 (Week 1): Pt will transfer onto toilet with min A overall to increase I with functional transfers.  OT Short Term Goal 1 - Progress (Week 1): Progressing toward goal OT Short Term Goal 2 (Week 1): Pt will don LB clothing items with mod A to decrease level of assistance with self care.  OT Short Term Goal 2 - Progress (Week 1): Met OT Short Term Goal 3 (Week 1): Pt will initiate grooming tasks with mod multimodal cues while seated at sink.  OT Short Term Goal 3 - Progress (Week 1): Met  Skilled Therapeutic Interventions/Progress Updates:    1:1. Pt received at RN station with no c/o pain. Pt agreeable to shower level bathing this session. Pt requires touching A and HOH A to reaching for grb bars for safety while transfering onto tub bench. Pt completes bathing at sit to stand level with touching A for balance and VC for termination of bathing tasks as pt washes all body parts 2-3 times. Pt dresses at sit to stand level at sink with touching A for balance and cueing for clothing orientation and VC for locating clothing on L visual field. Pt unable to problem solve with question cues to notice clothing donned backwards. Pt able to fix shirt when instructed it was donned backwards, however pt unable to switch BLE into correct pant legs requiring 3 trials and eventually set up of orientation of clothing to thread LE into coorect pant. Pt stands to brush teeth with HOH A to twist cap off of toothpaste and apply toothpaste to toothbrush d/t apraxia. Pt cued to fold towels placed on left. With demonstrations cues pt able to use BUE to fold 2 towels with increased time to improve BUE cordination during functional task. Exited session  With pt  seated at RN station in supervision of staff with QRB donned and chair exit alarm on  Therapy Documentation Precautions:  Precautions Precautions: Fall Restrictions Weight Bearing Restrictions: No General:   Vital Signs:   Pain: Pain Assessment Pain Score: 0-No pain ADL:  See Function Navigator for Current Functional Status.   Therapy/Group: Individual Therapy  Tonny Branch 05/05/2017, 2:11 PM

## 2017-05-05 NOTE — Progress Notes (Signed)
Speech Language Pathology Daily Session Note  Patient Details  Name: Glen [name] MRN: [id number] Date of Birth: [birth year]  Today's Date: 05/05/2017 SLP Individual Time: 1100-1203 SLP Individual Time Calculation (min): 63 min  Short Term Goals: Week 2: SLP Short Term Goal 1 (Week 2): Pt will consume dys 1 textures and thin liquids with Mod A verbal cues for use of swallowing precautions and minimal overt s/s of aspiration over 3 consecutive sessions.  SLP Short Term Goal 2 (Week 2): Pt will complete basic, familiar tasks with Mod A multimodal cues for task initiation and sequencing.   SLP Short Term Goal 3 (Week 2): Pt will locate items to the left of midline in 75% of opportunities during basic, familiar tasks with Max A multimodal cues.   SLP Short Term Goal 4 (Week 2): Pt will sustain attention to functional tasks for ~30 minutes with Max A multimodal cues.   SLP Short Term Goal 5 (Week 2): Pt will verbalize at the word level to convey needs and wants to caregivers with Mod A multimodal cues.    Skilled Therapeutic Interventions: Skilled treatment session focused on cognitive and dysphagia goals. SLP facilitated session by providing Max A faded to Min A verbal cues for problem solving and Supervision verbal cues for initiation during a basic color matching task. SLP also facilitated session by providing skilled observation of pt consuming trial tray of dys. 2 textures with thin liquids via straw. Pt demonstrated immediate cough x2, delayed cough x1, and throat clearing x1, suspect due to mixed consistencies of dys. 2 textures with thin liquids. Therefore, recommend pt continue current diet of dys. 1 textures with thin liquids and trials of dys. 2 textures with SLP. Throughout session, pt demonstrated attention to left field of environment with Supervision verbal cues. Pt also able to selectively attend to tasks in a moderately distractive environment for ~30 minutes with Supervision verbal  cues. Pt handed off to NT to finish lunch. Continue with current plan of care.      Function:  Eating Eating   Modified Consistency Diet: Yes Eating Assist Level: Set up assist for;Help managing cup/glass;Supervision or verbal cues;More than reasonable amount of time   Eating Set Up Assist For: Opening containers       Cognition Comprehension Comprehension assist level: Understands basic 25 - 49% of the time/ requires cueing 50 - 75% of the time  Expression   Expression assist level: Expresses basic 25 - 49% of the time/requires cueing 50 - 75% of the time. Uses single words/gestures.  Social Interaction Social Interaction assist level: Interacts appropriately 75 - 89% of the time - Needs redirection for appropriate language or to initiate interaction.  Problem Solving Problem solving assist level: Solves basic 50 - 74% of the time/requires cueing 25 - 49% of the time  Memory Memory assist level: Recognizes or recalls 25 - 49% of the time/requires cueing 50 - 75% of the time    Pain Pain Assessment Pain Score: 0-No pain  Therapy/Group: Individual Therapy  Meredeth Ide  SLP - Student 05/05/2017, 12:23 PM

## 2017-05-06 ENCOUNTER — Inpatient Hospital Stay (HOSPITAL_COMMUNITY): Payer: Medicare Other | Admitting: Occupational Therapy

## 2017-05-06 NOTE — Plan of Care (Signed)
  Problem: RH BOWEL ELIMINATION Goal: RH STG MANAGE BOWEL WITH ASSISTANCE Description STG Manage Bowel with mod Assistance.  Outcome: Progressing   Problem: RH BLADDER ELIMINATION Goal: RH STG MANAGE BLADDER WITH ASSISTANCE Description STG Manage Bladder With mod Assistance  Outcome: Progressing   Problem: RH SAFETY Goal: RH STG ADHERE TO SAFETY PRECAUTIONS W/ASSISTANCE/DEVICE Description STG Adhere to Safety Precautions With mod  Assistance/Device.  Outcome: Progressing Goal: RH STG DECREASED RISK OF FALL WITH ASSISTANCE Description STG Decreased Risk of Fall With mod Assistance.  Outcome: Progressing   Problem: RH COGNITION-NURSING Goal: RH STG USES MEMORY AIDS/STRATEGIES W/ASSIST TO PROBLEM SOLVE Description STG Uses Memory Aids/Strategies With mod Assistance to Problem Solve.  Outcome: Progressing

## 2017-05-06 NOTE — Progress Notes (Signed)
[place] PHYSICAL MEDICINE & REHABILITATION     PROGRESS NOTE    Subjective/Complaints:  Pt sleeping but awakens to voice, follows basic commands such as squeeze fingers  ROS: did not obtain  Objective: Vital Signs: Blood pressure 119/86, pulse (!) 53, temperature 98.1 F (36.7 C), temperature source Oral, resp. rate 18, weight 82.6 kg (182 lb 3.2 oz), SpO2 97 %. No results found. No results for input(s): WBC, HGB, HCT, PLT in the last 72 hours. No results for input(s): NA, K, CL, GLUCOSE, BUN, CREATININE, CALCIUM in the last 72 hours.  Invalid input(s): CO CBG (last 3)  No results for input(s): GLUCAP in the last 72 hours.  Wt Readings from Last 3 Encounters:  05/06/17 82.6 kg (182 lb 3.2 oz)  04/24/17 86.1 kg (189 lb 13.1 oz)  02/13/17 91.2 kg (201 lb)    Physical Exam:    Constitutional: No distress . Vital signs reviewed. HEENT: EOMI, oral membranes moist Cardiovascular: RRR without murmur. No JVD    Respiratory: CTA Bilaterally without wheezes or rales. Normal effort    GI: BS +, non-tender, non-distended  Neurological. Alert, follows simple commands HOH.  Motor: Moves all 4 limbs at least 4- to 4/5. Skin. Craniotomy sites are clean and dry, Psych: engages but distracted  Assessment/Plan: 1. Functional and cognitive deficits secondary to SDH which require 3+ hours per day of interdisciplinary therapy in a comprehensive inpatient rehab setting. Physiatrist is providing close team supervision and 24 hour management of active medical problems listed below. Physiatrist and rehab team continue to assess barriers to discharge/monitor patient progress toward functional and medical goals.  Function:  Bathing Bathing position   Position: Shower  Bathing parts Body parts bathed by patient: Chest, Abdomen, Front perineal area, Right arm, Left arm, Right upper leg, Left upper leg, Buttocks, Right lower leg, Left lower leg Body parts bathed by helper: Back  Bathing  assist Assist Level: Touching or steadying assistance(Pt > 75%)      Upper Body Dressing/Undressing Upper body dressing   What is the patient wearing?: Pull over shirt/dress     Pull over shirt/dress - Perfomed by patient: Thread/unthread right sleeve, Put head through opening, Thread/unthread left sleeve, Pull shirt over trunk(MAX cues and set up) Pull over shirt/dress - Perfomed by helper: Thread/unthread left sleeve, Pull shirt over trunk        Upper body assist Assist Level: Supervision or verbal cues, Set up   Set up : To obtain clothing/put away  Lower Body Dressing/Undressing Lower body dressing   What is the patient wearing?: Pants, Socks, Shoes     Pants- Performed by patient: Thread/unthread left pants leg, Pull pants up/down, Thread/unthread right pants leg(MAX cues to don over feet) Pants- Performed by helper: Thread/unthread right pants leg Non-skid slipper socks- Performed by patient: Don/doff right sock, Don/doff left sock Non-skid slipper socks- Performed by helper: Don/doff left sock Socks - Performed by patient: Don/doff right sock, Don/doff left sock(no cues!)   Shoes - Performed by patient: Don/doff right shoe, Don/doff left shoe Shoes - Performed by helper: Fasten right, Fasten left          Lower body assist Assist for lower body dressing: Supervision or verbal cues, Touching or steadying assistance (Pt > 75%)      Toileting Toileting Toileting activity did not occur: No continent bowel/bladder event Toileting steps completed by patient: Adjust clothing prior to toileting, Adjust clothing after toileting Toileting steps completed by helper: Adjust clothing prior to toileting, Performs  perineal hygiene, Adjust clothing after toileting Toileting Assistive Devices: Grab bar or rail  Toileting assist Assist level: More than reasonable time   Transfers Chair/bed transfer   Chair/bed transfer method: Ambulatory Chair/bed transfer assist level: Touching  or steadying assistance (Pt > 75%) Chair/bed transfer assistive device: Armrests     Locomotion Ambulation     Max distance: 50 Assist level: Maximal assist (Pt 25 - 49%)   Wheelchair       Assist Level: Dependent (Pt equals 0%)  Cognition Comprehension Comprehension assist level: Understands basic 25 - 49% of the time/ requires cueing 50 - 75% of the time  Expression Expression assist level: Expresses basic 25 - 49% of the time/requires cueing 50 - 75% of the time. Uses single words/gestures.  Social Interaction Social Interaction assist level: Interacts appropriately with others - No medications needed.  Problem Solving Problem solving assist level: Solves basic 25 - 49% of the time - needs direction more than half the time to initiate, plan or complete simple activities  Memory Memory assist level: Recognizes or recalls 25 - 49% of the time/requires cueing 50 - 75% of the time   Medical Problem List and Plan: 1.Decreased functional mobilitysecondary to right subdural hematoma after fall complicated by acute hypoxic respiratory failure and stridor as well as history of glioblastoma and seizure disorder -CIR PT, OT, SLP ---working toward supervision goals 2. DVT Prophylaxis/Anticoagulation/history of heparin-induced thrombocytopenia: SCDs. Arixtra currently on hold due to hematoma 3. Pain Management:Tylenol as needed 4. Mood:Prozac 20 mg daily 5. Neuropsych: This patientisnot fully capable of making decisions on hisown behalf.   -enclosure bed for patient safety 6. Skin/Wound Care:Routine skin checks 7. Fluids/Electrolytes/Nutrition:encourage PO    -good po intake     8.Seizure disorder. Vimpat 200 mg twice daily, Keppra 1500 mg twice daily. Follow-up neurology services Dr. Krista Blue as outpatient No recurrent seizures noted 9.Dysphagia. remains on Dysphagia #1 thin liquids.   -may be an opportunity to upgrade diet- afebrile no cough   10.Hypothyroidism. Synthroid 11.Hyperlipidemia. Lipitor      13.MRSA PCR screening positive. Maintain contact precautions 14.Constipation. Laxative assistance 15. Hyponatremia  Sodium 135 on 4/15---recheck Monday         LOS (Days) 12 A FACE TO FACE EVALUATION WAS PERFORMED  Charlett Blake, MD 05/06/2017 8:01 AM

## 2017-05-06 NOTE — Progress Notes (Signed)
Occupational Therapy Session Note  Patient Details  Name: Glen [name] MRN: [id number] Date of Birth: 1970/10/31  Today's Date: 05/06/2017 OT Individual Time: 0900-1000 OT Individual Time Calculation (min): 60 min    Short Term Goals: Week 2:  OT Short Term Goal 1 (Week 2): Pt will perform shower transfer at min A. OT Short Term Goal 2 (Week 2): Pt will perform toileting at steady assist level for balance during LB dressing and hygiene. OT Short Term Goal 3 (Week 2): Pt will perform toilet transfer with min A and use of RW.   Skilled Therapeutic Interventions/Progress Updates:    Upon entering the room, pt seated in wheelchair with NT providing supervision for meal. Pt eating for another 20 minutes with OT providing supervision and min verbal cues for safety. Pt feeding self with R UE and utilizing utensils. Pt verbalized need for toilet and ambulated with steady assistance and RW into bathroom. Pt requires mod multimodal cues for safety with transfer, sequence and problem solving. Pt having BM and performed hygiene and clothing management with steady assistance for balance. Pt returning to stand at sink with RW and steady assistane for hand hygiene and to brush teeth with min gestural cuing. Pt returning to wheelchair and taken to day room. Pt given paper requesting pt to write name, birthday, and copy shapes. Pt having increased difficulty with task and handwriting not legible. Pt's shapes not completed and pt states he does not know his birthday. Pt remained in wheelchair and left at RN station for safety with quick release belt donned and chair alarm activated.   Therapy Documentation Precautions:  Precautions Precautions: Fall Restrictions Weight Bearing Restrictions: No General:   Vital Signs: Therapy Vitals Temp: 98.1 F (36.7 C) Temp Source: Oral Pulse Rate: 77 Resp: 18 BP: 128/80 Patient Position (if appropriate): Sitting Oxygen Therapy SpO2: 98 % O2 Device: Room  Air Other Treatments:    See Function Navigator for Current Functional Status.   Therapy/Group: Individual Therapy  Gypsy Decant 05/06/2017, 5:16 PM

## 2017-05-07 DIAGNOSIS — R1314 Dysphagia, pharyngoesophageal phase: Secondary | ICD-10-CM

## 2017-05-07 DIAGNOSIS — S065X0D Traumatic subdural hemorrhage without loss of consciousness, subsequent encounter: Secondary | ICD-10-CM

## 2017-05-07 NOTE — Progress Notes (Signed)
Continent of urine X 1. Slept good thus far tonight. Continues to require enclosure bed for safety.Glen [name] A

## 2017-05-07 NOTE — Progress Notes (Signed)
[place] PHYSICAL MEDICINE & REHABILITATION     PROGRESS NOTE    Subjective/Complaints: sleeping with glasses on Remains confused wit hpoor safety awareness  ROS: did not obtain  Objective: Vital Signs: Blood pressure 101/74, pulse 74, temperature 98.3 F (36.8 C), temperature source Oral, resp. rate 20, weight 83.9 kg (185 lb), SpO2 100 %. No results found. No results for input(s): WBC, HGB, HCT, PLT in the last 72 hours. No results for input(s): NA, K, CL, GLUCOSE, BUN, CREATININE, CALCIUM in the last 72 hours.  Invalid input(s): CO CBG (last 3)  No results for input(s): GLUCAP in the last 72 hours.  Wt Readings from Last 3 Encounters:  05/07/17 83.9 kg (185 lb)  04/24/17 86.1 kg (189 lb 13.1 oz)  02/13/17 91.2 kg (201 lb)    Physical Exam:    Constitutional: No distress . Vital signs reviewed. HEENT: EOMI, oral membranes moist Cardiovascular: RRR without murmur. No JVD    Respiratory: CTA Bilaterally without wheezes or rales. Normal effort    GI: BS +, non-tender, non-distended  Neurological. Alert, follows simple commands HOH.  Motor: Moves all 4 limbs at least 4- to 4/5. Skin. Craniotomy sites are clean and dry, Psych: engages but distracted  Assessment/Plan: 1. Functional and cognitive deficits secondary to SDH which require 3+ hours per day of interdisciplinary therapy in a comprehensive inpatient rehab setting. Physiatrist is providing close team supervision and 24 hour management of active medical problems listed below. Physiatrist and rehab team continue to assess barriers to discharge/monitor patient progress toward functional and medical goals.  Function:  Bathing Bathing position   Position: Shower  Bathing parts Body parts bathed by patient: Chest, Abdomen, Front perineal area, Right arm, Left arm, Right upper leg, Left upper leg, Buttocks, Right lower leg, Left lower leg Body parts bathed by helper: Back  Bathing assist Assist Level: Touching  or steadying assistance(Pt > 75%)      Upper Body Dressing/Undressing Upper body dressing   What is the patient wearing?: Pull over shirt/dress     Pull over shirt/dress - Perfomed by patient: Thread/unthread right sleeve, Put head through opening, Thread/unthread left sleeve, Pull shirt over trunk(MAX cues and set up) Pull over shirt/dress - Perfomed by helper: Thread/unthread left sleeve, Pull shirt over trunk        Upper body assist Assist Level: Supervision or verbal cues, Set up   Set up : To obtain clothing/put away  Lower Body Dressing/Undressing Lower body dressing   What is the patient wearing?: Pants, Socks, Shoes     Pants- Performed by patient: Thread/unthread left pants leg, Pull pants up/down, Thread/unthread right pants leg(MAX cues to don over feet) Pants- Performed by helper: Thread/unthread right pants leg Non-skid slipper socks- Performed by patient: Don/doff right sock, Don/doff left sock Non-skid slipper socks- Performed by helper: Don/doff left sock Socks - Performed by patient: Don/doff right sock, Don/doff left sock(no cues!)   Shoes - Performed by patient: Don/doff right shoe, Don/doff left shoe Shoes - Performed by helper: Fasten right, Fasten left          Lower body assist Assist for lower body dressing: Supervision or verbal cues, Touching or steadying assistance (Pt > 75%)      Toileting Toileting Toileting activity did not occur: No continent bowel/bladder event Toileting steps completed by patient: Adjust clothing prior to toileting, Adjust clothing after toileting Toileting steps completed by helper: Adjust clothing prior to toileting, Performs perineal hygiene, Adjust clothing after toileting Toileting Assistive Devices:  Grab bar or rail  Toileting assist Assist level: Touching or steadying assistance (Pt.75%)   Transfers Chair/bed transfer   Chair/bed transfer method: Ambulatory Chair/bed transfer assist level: Touching or steadying  assistance (Pt > 75%) Chair/bed transfer assistive device: Armrests     Locomotion Ambulation     Max distance: 50 Assist level: Maximal assist (Pt 25 - 49%)   Wheelchair       Assist Level: Dependent (Pt equals 0%)  Cognition Comprehension Comprehension assist level: Understands basic 25 - 49% of the time/ requires cueing 50 - 75% of the time  Expression Expression assist level: Expresses basic 25 - 49% of the time/requires cueing 50 - 75% of the time. Uses single words/gestures.  Social Interaction Social Interaction assist level: Interacts appropriately with others - No medications needed., Interacts appropriately 75 - 89% of the time - Needs redirection for appropriate language or to initiate interaction.  Problem Solving Problem solving assist level: Solves basic 25 - 49% of the time - needs direction more than half the time to initiate, plan or complete simple activities  Memory Memory assist level: Recognizes or recalls 25 - 49% of the time/requires cueing 50 - 75% of the time   Medical Problem List and Plan: 1.Decreased functional mobilitysecondary to right subdural hematoma after fall complicated by acute hypoxic respiratory failure and stridor as well as history of glioblastoma and seizure disorder -CIR PT, OT, SLP ---working toward supervision goals Cont enclosure bed 2. DVT Prophylaxis/Anticoagulation/history of heparin-induced thrombocytopenia: SCDs. Arixtra currently on hold due to hematoma 3. Pain Management:Tylenol as needed 4. Mood:Prozac 20 mg daily 5. Neuropsych: This patientisnot fully capable of making decisions on hisown behalf.   -enclosure bed for patient safety 6. Skin/Wound Care:Routine skin checks 7. Fluids/Electrolytes/Nutrition:encourage PO    -good po intake 90% meal intake recorded     8.Seizure disorder. Vimpat 200 mg twice daily, Keppra 1500 mg twice daily. Follow-up neurology services Dr. Krista Blue as outpatient No recurrent  seizures noted 9.Dysphagia. remains on Dysphagia #1 thin liquids.   -may be an opportunity to upgrade diet- afebrile no cough  10.Hypothyroidism. Synthroid 11.Hyperlipidemia. Lipitor      13.MRSA PCR screening positive. Maintain contact precautions 14.Constipation. Laxative assistance 15. Hyponatremia  Sodium 135 on 4/15---recheck 4/22         LOS (Days) 13 A FACE TO FACE EVALUATION WAS PERFORMED  Charlett Blake, MD 05/07/2017 7:44 AM

## 2017-05-08 ENCOUNTER — Inpatient Hospital Stay (HOSPITAL_COMMUNITY): Payer: Medicare Other | Admitting: Speech Pathology

## 2017-05-08 ENCOUNTER — Inpatient Hospital Stay (HOSPITAL_COMMUNITY): Payer: Medicare Other | Admitting: Occupational Therapy

## 2017-05-08 ENCOUNTER — Inpatient Hospital Stay (HOSPITAL_COMMUNITY): Payer: Medicare Other

## 2017-05-08 LAB — CBC
HCT: 37.1 % — ABNORMAL LOW (ref 39.0–52.0)
HEMOGLOBIN: 12.2 g/dL — AB (ref 13.0–17.0)
MCH: 29 pg (ref 26.0–34.0)
MCHC: 32.9 g/dL (ref 30.0–36.0)
MCV: 88.3 fL (ref 78.0–100.0)
Platelets: 208 10*3/uL (ref 150–400)
RBC: 4.2 MIL/uL — ABNORMAL LOW (ref 4.22–5.81)
RDW: 13.9 % (ref 11.5–15.5)
WBC: 5.4 10*3/uL (ref 4.0–10.5)

## 2017-05-08 LAB — BASIC METABOLIC PANEL
ANION GAP: 7 (ref 5–15)
BUN: 8 mg/dL (ref 6–20)
CO2: 27 mmol/L (ref 22–32)
CREATININE: 0.94 mg/dL (ref 0.61–1.24)
Calcium: 9.1 mg/dL (ref 8.9–10.3)
Chloride: 102 mmol/L (ref 101–111)
GFR calc Af Amer: >60 mL/min (ref >60)
GFR calc non Af Amer: >60 mL/min (ref >60)
GLUCOSE: 97 mg/dL (ref 65–99)
Potassium: 4.2 mmol/L (ref 3.5–5.1)
Sodium: 136 mmol/L (ref 135–145)

## 2017-05-08 NOTE — Progress Notes (Signed)
Physical Therapy Note  Patient Details  Name: Glen [name] MRN: [id number] Date of Birth: [birth year] Today's Date: 05/08/2017  1300-1415, 75 min individual tx Pain: none per pt  Pt asleep in enclosure bed in room, but easily aroused to touch.  PT donned pt's pants in side lying.  Pt sat up with visual cues, and stood to assist with pulling up pants.  Pt indicated that he needed to use BR.  Toilet transfer for contient voiding.  In sitting in w/c, pt donned R shoe independently, and L shoe with min assist, over non slip socks, with difficulty.  Stand pivot with RW w/c< NuStep. Neuromuscular re-education via forced use, multimodal cues for alternating reciprocal movement x 4 extremities on NuStep at level 4 x 8 minutes.  With demo, visual cues, pt performed bil heel raises in standing with RW, min assist for balance.  Gait training up/down 12 steps 2 rails, min/mod assist due to drifting/LOB backwards as he stepped onto landing.  Pt was unsafe on 3" high steps due to large foot size and inability to problem-solve or hear suggestions from PT.  Gait over level tile with RW x 90' with min assist.  Pt very distracted by loose diaper.  +2 needed in standing to adjust pt's diaper while he stood with RW.  Pt switched regular RW for wide RW due to pt's tendency to bump foot against legs of Rw during gait.  Safety improved with wider RW.  PT consulted Glen [name], CSW to see if pt is eligible to get a wide RW during this admission.   Pt's mother, father, and brother arrived in room at end of session. PT Informed them of pt's improved safety with wider RW.  They stated that insurance did not buy his previous RW; they will consult with Glen [name], Glen [name].  Pt left resting in w/c with quick release belt applied, seat alarm set and family present.  PT verified that family understands that they need to take pt to nurse's station if they leave the room. They stated that they understand.    See function navigator for current  status.  Bari Handshoe 05/08/2017, 12:54 PM

## 2017-05-08 NOTE — Progress Notes (Signed)
Slept good, incontinent of urine X 2, then continent after ambulated to BR. Decreased PO intake. Continues to require enclosure bed-decreased safety awareness and impulsive.Glen [name] A

## 2017-05-08 NOTE — Progress Notes (Signed)
[place] PHYSICAL MEDICINE & REHABILITATION     PROGRESS NOTE    Subjective/Complaints: No new issues. Still with decr safety awareness  ROS: Patient denies fever, rash, sore throat, blurred vision, nausea, vomiting, diarrhea, cough, shortness of breath or chest pain, joint or back pain, headache, or mood change.    Objective: Vital Signs: Blood pressure 112/65, pulse 67, temperature 98 F (36.7 C), resp. rate 17, weight 83.1 kg (183 lb 4.8 oz), SpO2 99 %. No results found. Recent Labs    05/08/17 0621  WBC 5.4  HGB 12.2*  HCT 37.1*  PLT 208   Recent Labs    05/08/17 0621  NA 136  K 4.2  CL 102  GLUCOSE 97  BUN 8  CREATININE 0.94  CALCIUM 9.1   CBG (last 3)  No results for input(s): GLUCAP in the last 72 hours.  Wt Readings from Last 3 Encounters:  05/08/17 83.1 kg (183 lb 4.8 oz)  04/24/17 86.1 kg (189 lb 13.1 oz)  02/13/17 91.2 kg (201 lb)    Physical Exam:    Constitutional: No distress . Vital signs reviewed. HEENT: EOMI, oral membranes moist Cardiovascular: RRR without murmur. No JVD    Respiratory: CTA Bilaterally without wheezes or rales. Normal effort    GI: BS +, non-tender, non-distended  Neurological. Alert, follows simple commands. Limited insight HOH.  Motor: strengh 4/5. Skin. Craniotomy sites are clean and dry, Psych: engages but distracted  Assessment/Plan: 1. Functional and cognitive deficits secondary to SDH which require 3+ hours per day of interdisciplinary therapy in a comprehensive inpatient rehab setting. Physiatrist is providing close team supervision and 24 hour management of active medical problems listed below. Physiatrist and rehab team continue to assess barriers to discharge/monitor patient progress toward functional and medical goals.  Function:  Bathing Bathing position   Position: Shower  Bathing parts Body parts bathed by patient: Chest, Abdomen, Front perineal area, Right arm, Left arm, Right upper leg, Left  upper leg, Buttocks, Right lower leg, Left lower leg Body parts bathed by helper: Back  Bathing assist Assist Level: Touching or steadying assistance(Pt > 75%)      Upper Body Dressing/Undressing Upper body dressing   What is the patient wearing?: Pull over shirt/dress     Pull over shirt/dress - Perfomed by patient: Thread/unthread right sleeve, Put head through opening, Thread/unthread left sleeve, Pull shirt over trunk(MAX cues and set up) Pull over shirt/dress - Perfomed by helper: Thread/unthread left sleeve, Pull shirt over trunk        Upper body assist Assist Level: Supervision or verbal cues, Set up   Set up : To obtain clothing/put away  Lower Body Dressing/Undressing Lower body dressing   What is the patient wearing?: Pants, Socks, Shoes     Pants- Performed by patient: Thread/unthread left pants leg, Pull pants up/down, Thread/unthread right pants leg(MAX cues to don over feet) Pants- Performed by helper: Thread/unthread right pants leg Non-skid slipper socks- Performed by patient: Don/doff right sock, Don/doff left sock Non-skid slipper socks- Performed by helper: Don/doff left sock Socks - Performed by patient: Don/doff right sock, Don/doff left sock(no cues!)   Shoes - Performed by patient: Don/doff right shoe, Don/doff left shoe Shoes - Performed by helper: Fasten right, Fasten left          Lower body assist Assist for lower body dressing: Supervision or verbal cues, Touching or steadying assistance (Pt > 75%)      Toileting Toileting Toileting activity did not occur: No  continent bowel/bladder event Toileting steps completed by patient: Adjust clothing prior to toileting, Adjust clothing after toileting Toileting steps completed by helper: Adjust clothing prior to toileting, Performs perineal hygiene, Adjust clothing after toileting Toileting Assistive Devices: Grab bar or rail  Toileting assist Assist level: Touching or steadying assistance (Pt.75%)    Transfers Chair/bed transfer   Chair/bed transfer method: Ambulatory Chair/bed transfer assist level: Touching or steadying assistance (Pt > 75%) Chair/bed transfer assistive device: Armrests     Locomotion Ambulation     Max distance: 50 Assist level: Maximal assist (Pt 25 - 49%)   Wheelchair       Assist Level: Dependent (Pt equals 0%)  Cognition Comprehension Comprehension assist level: Understands basic 25 - 49% of the time/ requires cueing 50 - 75% of the time  Expression Expression assist level: Expresses basic 25 - 49% of the time/requires cueing 50 - 75% of the time. Uses single words/gestures.  Social Interaction Social Interaction assist level: Interacts appropriately with others - No medications needed., Interacts appropriately 75 - 89% of the time - Needs redirection for appropriate language or to initiate interaction.  Problem Solving Problem solving assist level: Solves basic 25 - 49% of the time - needs direction more than half the time to initiate, plan or complete simple activities  Memory Memory assist level: Recognizes or recalls 25 - 49% of the time/requires cueing 50 - 75% of the time   Medical Problem List and Plan: 1.Decreased functional mobilitysecondary to right subdural hematoma after fall complicated by acute hypoxic respiratory failure and stridor as well as history of glioblastoma and seizure disorder -CIR PT, OT, SLP ---working toward supervision goals 2. DVT Prophylaxis/Anticoagulation/history of heparin-induced thrombocytopenia: SCDs. Arixtra currently on hold due to hematoma 3. Pain Management:Tylenol as needed 4. Mood:Prozac 20 mg daily 5. Neuropsych: This patientisnot fully capable of making decisions on hisown behalf.   -enclosure bed  required for patient safety 6. Skin/Wound Care:Routine skin checks 7. Fluids/Electrolytes/Nutrition:encourage PO    -good po intake in general     8.Seizure disorder. Vimpat 200 mg  twice daily, Keppra 1500 mg twice daily. Follow-up neurology services Dr. Krista Blue as outpatient No recurrent seizures noted 9.Dysphagia. remains on Dysphagia #1 thin liquids.   -may be an opportunity to upgrade diet- afebrile no cough  10.Hypothyroidism. Synthroid 11.Hyperlipidemia. Lipitor  13.MRSA PCR screening positive. Maintain contact precautions 14.Constipation. Laxative assistance 15. Hyponatremia  Sodium 135 4/22         LOS (Days) [place] EVALUATION WAS PERFORMED  Meredith Staggers, MD 05/08/2017 9:59 AM

## 2017-05-08 NOTE — Progress Notes (Signed)
Speech Language Pathology Daily Session Note  Patient Details  Name: Glen [name] MRN: [id number] Date of Birth: 23-Nov-1970  Today's Date: 05/08/2017 SLP Individual Time: 1100-1200 SLP Individual Time Calculation (min): 60 min  Short Term Goals: Week 2: SLP Short Term Goal 1 (Week 2): Pt will consume dys 1 textures and thin liquids with Mod A verbal cues for use of swallowing precautions and minimal overt s/s of aspiration over 3 consecutive sessions.  SLP Short Term Goal 2 (Week 2): Pt will complete basic, familiar tasks with Mod A multimodal cues for task initiation and sequencing.   SLP Short Term Goal 3 (Week 2): Pt will locate items to the left of midline in 75% of opportunities during basic, familiar tasks with Max A multimodal cues.   SLP Short Term Goal 4 (Week 2): Pt will sustain attention to functional tasks for ~30 minutes with Max A multimodal cues.   SLP Short Term Goal 5 (Week 2): Pt will verbalize at the word level to convey needs and wants to caregivers with Mod A multimodal cues.    Skilled Therapeutic Interventions: Skilled treatment session focused on cognitive-linguistic and dysphagia goals. SLP facilitated session by providing Max A faded to Mod A verbal cues for problem solving and Min A verbal cues for initiation during a basic card task (war). During task, pt demonstrated increased verbal output at the phrase level with 90% speech intelligibility. SLP also facilitated session by providing skilled observation of pt consuming lunch. Pt consumed dys. 1 textures with thin liquids via straw with immediate cough x2, suspect due to mixed consistencies and incoordination of breathing pattern during swallowing. Pt able to use swallow compensatory strategies (small bites and slow rate) and self-monitor anterior spillage with Supervision verbal cues. Throughout session, pt demonstrated attention to left field of environment with Mod I, and selectively attended to tasks in a mildly  distracting environment for ~30 minutes with Supervision verbal cues. Pt handed off to NT to finish lunch. Continue with current plan of care.      Function:  Eating Eating   Modified Consistency Diet: Yes Eating Assist Level: Set up assist for;Help managing cup/glass;Supervision or verbal cues;More than reasonable amount of time   Eating Set Up Assist For: Opening containers       Cognition Comprehension Comprehension assist level: Understands basic 50 - 74% of the time/ requires cueing 25 - 49% of the time  Expression   Expression assist level: Expresses basic 25 - 49% of the time/requires cueing 50 - 75% of the time. Uses single words/gestures.  Social Interaction Social Interaction assist level: Interacts appropriately 75 - 89% of the time - Needs redirection for appropriate language or to initiate interaction.  Problem Solving Problem solving assist level: Solves basic 50 - 74% of the time/requires cueing 25 - 49% of the time  Memory Memory assist level: Recognizes or recalls 25 - 49% of the time/requires cueing 50 - 75% of the time    Pain Pain Assessment Pain Score: 0-No pain  Therapy/Group: Individual Therapy  Meredeth Ide  SLP - Student 05/08/2017, 12:58 PM

## 2017-05-08 NOTE — Progress Notes (Signed)
Occupational Therapy Session Note  Patient Details  Name: [name] MRN: [id number] Date of Birth: [birth year]  Today's Date: 05/08/2017 OT Individual Time: 0903-1000 OT Individual Time Calculation (min): 57 min    Short Term Goals: Week 2:  OT Short Term Goal 1 (Week 2): Pt will perform shower transfer at min A. OT Short Term Goal 2 (Week 2): Pt will perform toileting at steady assist level for balance during LB dressing and hygiene. OT Short Term Goal 3 (Week 2): Pt will perform toilet transfer with min A and use of RW.   Skilled Therapeutic Interventions/Progress Updates:    Upon entering the room, pt returning from bathroom with RN. Pt agreeable to OT intervention. Pt changing hospital gown while seated with set up A. Pt performed grooming tasks while seated with increased time and min multimodal cues for initiation and sequencing. OT assisted pt from room via wheelchair for matching game on table top. Pt given choice of 10 cards with each card having a matching pair. Pt seated in a moderately distracting room and needing mod multimodal cues and 15 minutes to complete task. Pt returned to RN station for safety with quick release belt donned and chair alarm activated.   Therapy Documentation Precautions:  Precautions Precautions: Fall Restrictions Weight Bearing Restrictions: No  See Function Navigator for Current Functional Status.   Therapy/Group: Individual Therapy  Gypsy Decant 05/08/2017, 10:25 AM

## 2017-05-09 ENCOUNTER — Inpatient Hospital Stay (HOSPITAL_COMMUNITY): Payer: Medicare Other | Admitting: Speech Pathology

## 2017-05-09 ENCOUNTER — Inpatient Hospital Stay (HOSPITAL_COMMUNITY): Payer: Medicare Other | Admitting: Occupational Therapy

## 2017-05-09 ENCOUNTER — Inpatient Hospital Stay (HOSPITAL_COMMUNITY): Payer: Medicare Other | Admitting: Physical Therapy

## 2017-05-09 NOTE — Progress Notes (Signed)
Speech Language Pathology Weekly Progress and Session Note  Patient Details  Name: Glen [name] MRN: [id number] Date of Birth: 28-Apr-1970  Beginning of progress report period: May 02, 2017 End of progress report period: May 09, 2017  Today's Date: 05/09/2017 SLP Individual Time: 3335-4562 SLP Individual Time Calculation (min): 45 min   Missed Time: 15 minutes, RN care   Short Term Goals: Week 2: SLP Short Term Goal 1 (Week 2): Pt will consume dys 1 textures and thin liquids with Mod A verbal cues for use of swallowing precautions and minimal overt s/s of aspiration over 3 consecutive sessions.  SLP Short Term Goal 1 - Progress (Week 2): Met SLP Short Term Goal 2 (Week 2): Pt will complete basic, familiar tasks with Mod A multimodal cues for task initiation and sequencing.   SLP Short Term Goal 2 - Progress (Week 2): Met SLP Short Term Goal 3 (Week 2): Pt will locate items to the left of midline in 75% of opportunities during basic, familiar tasks with Max A multimodal cues.   SLP Short Term Goal 3 - Progress (Week 2): Met SLP Short Term Goal 4 (Week 2): Pt will sustain attention to functional tasks for ~30 minutes with Max A multimodal cues.   SLP Short Term Goal 4 - Progress (Week 2): Met SLP Short Term Goal 5 (Week 2): Pt will verbalize at the word level to convey needs and wants to caregivers with Mod A multimodal cues.   SLP Short Term Goal 5 - Progress (Week 2): Met    New Short Term Goals: Week 3: SLP Short Term Goal 1 (Week 3): Pt will consume dys. 1 textures and thin liquids with Min A verbal cues for use of swallowing precautions and minimal overt s/s of aspiration over 3 consecutive sessions.  SLP Short Term Goal 2 (Week 3): Pt will consume trials of dys. 2 textures with efficient mastication and minimal oral residue with Mod A verbal cues for use of compensatory swallowing strategies over 2 sessions prior to upgrade.  SLP Short Term Goal 3 (Week 3): Pt will complete  basic, familiar problem solving tasks with Min A multimodal cues.   SLP Short Term Goal 4 (Week 3): Pt will locate items to the left of midline in 75% of opportunities during basic, familiar tasks with Min A multimodal cues.   SLP Short Term Goal 5 (Week 3): Pt will demonstrate selective attention in a mildly distracting environment for ~45 minutes with Min A verbal cues for redirection.   SLP Short Term Goal 6 (Week 3): Pt will verbalize at the word level to convey needs and wants to caregivers with Min A multimodal cues.    Weekly Progress Updates: Pt continues to make functional gains and has met 5 out of 5 STGs this reporting period. Currently, pt requires overall Supervision verbal cues for initiation and sequencing during basic and functional tasks. Pt also demonstrates attention to left field of environment with Mod A verbal cues and selective attention in a mildly distracting environment for ~30 minutes with Min A verbal cues for redirection. Pt consumes dys. 1 textures and thin liquids with minimal overt s/s of aspiration and demonstrates use of swallow compensatory strategies with Mod A verbal cues. Pt consumed trials of dys. 2 textures with SLP; however, demonstrated consistent overt s/s of aspiration, suspect due to incoordination of breathing patterns and swallow initiation of mixed consistencies. Therefore, recommend continue current diet of dys. 1 textures with trials of dys. 2 textures  with SLP only. Pt also demonstrates increased verbal output during structured tasks with 90% speech intelligibility at the phrase level. Patient and family education is ongoing. Patient would benefit from continued skilled SLP intervention to maximize his cognitive and swallowing functions and overall functional independence prior to discharge.      Intensity: Minumum of 1-2 x/day, 30 to 90 minutes Frequency: 3 to 5 out of 7 days Duration/Length of Stay: 05/16/2017 Treatment/Interventions: Cognitive  remediation/compensation;Cueing hierarchy;Dysphagia/aspiration precaution training;Functional tasks;Patient/family education;Multimodal communication approach;Internal/external aids;Environmental controls   Daily Session  Skilled Therapeutic Interventions: Skilled treatment session focused on cognitive goals. SLP facilitated session by providing Max A verbal and visual cues for problem solving and Supervision verbal cues for initiation during a basic puzzle task. Pt able to attend to left field of environment with Mod A verbal cues and selectively attend to task in a mildly distracting environment for ~30 minutes with Min A verbal cues. Throughout session, pt verbally produced words and phrases with 90% speech intelligibility. Pt left upright in wheelchair with alarm on and quick release belt in place at the nurses station. Continue with current plan of care.        Function:   Cognition Comprehension Comprehension assist level: Understands basic 50 - 74% of the time/ requires cueing 25 - 49% of the time  Expression   Expression assist level: Expresses basic 25 - 49% of the time/requires cueing 50 - 75% of the time. Uses single words/gestures.  Social Interaction Social Interaction assist level: Interacts appropriately 75 - 89% of the time - Needs redirection for appropriate language or to initiate interaction.  Problem Solving Problem solving assist level: Solves basic 25 - 49% of the time - needs direction more than half the time to initiate, plan or complete simple activities  Memory Memory assist level: Recognizes or recalls 25 - 49% of the time/requires cueing 50 - 75% of the time     Pain Pain Assessment Pain Scale: 0-10 Pain Score: 0-No pain  Therapy/Group: Individual Therapy  Meredeth Ide  SLP - Student 05/09/2017, 12:30 PM

## 2017-05-09 NOTE — Plan of Care (Signed)
  Problem: RH BOWEL ELIMINATION Goal: RH STG MANAGE BOWEL WITH ASSISTANCE Description STG Manage Bowel with mod Assistance.  Outcome: Progressing   Problem: RH BLADDER ELIMINATION Goal: RH STG MANAGE BLADDER WITH ASSISTANCE Description STG Manage Bladder With mod Assistance  Outcome: Progressing   Problem: RH SAFETY Goal: RH STG ADHERE TO SAFETY PRECAUTIONS W/ASSISTANCE/DEVICE Description STG Adhere to Safety Precautions With mod  Assistance/Device.  Outcome: Not Progressing Goal: RH STG DECREASED RISK OF FALL WITH ASSISTANCE Description STG Decreased Risk of Fall With mod Assistance.  Outcome: Not Progressing   Problem: RH COGNITION-NURSING Goal: RH STG USES MEMORY AIDS/STRATEGIES W/ASSIST TO PROBLEM SOLVE Description STG Uses Memory Aids/Strategies With mod Assistance to Problem Solve.  Outcome: Not Progressing   Still requiring use of enclosure bed for safety.  Unable to retain information provided.  Brita Romp, RN

## 2017-05-09 NOTE — Progress Notes (Signed)
Occupational Therapy Session Note  Patient Details  Name: Glen [name] MRN: [id number] Date of Birth: 04/25/1970  Today's Date: 05/09/2017 OT Individual Time: 8338-2505 OT Individual Time Calculation (min): 70 min    Short Term Goals: Week 2:  OT Short Term Goal 1 (Week 2): Pt will perform shower transfer at min A. OT Short Term Goal 2 (Week 2): Pt will perform toileting at steady assist level for balance during LB dressing and hygiene. OT Short Term Goal 3 (Week 2): Pt will perform toilet transfer with min A and use of RW.       Skilled Therapeutic Interventions/Progress Updates:    Upon entering the room,pt supine in bed with no c/o pain and agreeable to shower. Pt ambulating with RW and steady assist into bathroom and onto TTB. Pt bathing at shower level from seated position with increased safety awareness. Pt did not attempt to stand and crossed LEs into figure 4 position to wash B feet while holding onto grab bar for support. Pt exiting the shower and transferred onto toilet to urinate and for dressing tasks with steady assistance overall with mod multimodal cues and gestures for sequence and initiation. Pt needing increased time to complete all tasks. Pt ambulating to wheelchair and donning B shoes in figure four position while seated with set up A . Pt seated in wheelchair for breakfast with set up A to open containers and cut food. Pt needing supervision for safety with min cues for small bites/sips. Pt remained in wheelchair with quick release belt donned and chair alarm activated.   Therapy Documentation Precautions:  Precautions Precautions: Fall Restrictions Weight Bearing Restrictions: No  See Function Navigator for Current Functional Status.   Therapy/Group: Individual Therapy  Gypsy Decant 05/09/2017, 7:56 AM

## 2017-05-09 NOTE — Progress Notes (Signed)
Physical Therapy Weekly Progress Note  Patient Details  Name: Glen [name] MRN: [id number] Date of Birth: 03-Jan-1971  Beginning of progress report period: May 02, 2017 End of progress report period: May 09, 2017  Today's Date: 05/09/2017 PT Individual Time: 1300-1411 PT Individual Time Calculation (min): 71 min   Pt asleep upon PT arrival but easily awakens to PTs voice.  Pt performs bed mobility with supervision.  Transfer to w/c with min guard/steadying assist.  Pt performs gait 100', 16' with RW with close supervision for straight line, min A needed with turns or sudden stops due to busy environment.  Pt performs nustep x 5 minutes level 5 for activity tolerance and mm strengthening.  Pt eats lunch with PT providing supervision.  Pt requires cues < 10% of time for small bites and with no episodes of coughing during meal.  Pt with supervision cues to locate items and initiate eating, pt then able to complete meal without further cues for initiation. Pt left in w/c at nurses station with alarm set, needs at hand.  Patient has met 2 of 2 short term goals.  Pt improving balance and activity tolerance.  Still with significant LOB when turning or sudden stops with RW.  Patient continues to demonstrate the following deficits muscle weakness, abnormal tone, unbalanced muscle activation, motor apraxia, decreased coordination and decreased motor planning, decreased initiation, decreased attention, decreased awareness, decreased problem solving, decreased safety awareness, decreased memory and delayed processing and decreased standing balance, decreased postural control and decreased balance strategies and therefore will continue to benefit from skilled PT intervention to increase functional independence with mobility.  Patient not progressing toward long term goals.  See goal revision..  Continue plan of care.  PT Short Term Goals Week 2:  PT Short Term Goal 1 (Week 2): Pt will perform standing  balance for functional task with min A PT Short Term Goal 1 - Progress (Week 2): Met PT Short Term Goal 2 (Week 2): pt will sustain attention to functional task x 1 minute with min cues PT Short Term Goal 2 - Progress (Week 2): Met Week 3:  PT Short Term Goal 1 (Week 3): = LTG  Skilled Therapeutic Interventions/Progress Updates:  Ambulation/gait training;Pain management;Stair training;Balance/vestibular training;DME/adaptive equipment instruction;Patient/family education;Therapeutic Activities;Wheelchair propulsion/positioning;Cognitive remediation/compensation;Therapeutic Exercise;Community reintegration;Functional mobility training;UE/LE Strength taining/ROM;Discharge planning;Neuromuscular re-education;Splinting/orthotics;UE/LE Coordination activities   Therapy Documentation Precautions:  Precautions Precautions: Fall Restrictions Weight Bearing Restrictions: No Pain: Pain Assessment Pain Scale: 0-10 Pain Score: 0-No pain   See Function Navigator for Current Functional Status.  Therapy/Group: Individual Therapy  DONAWERTH,KAREN 05/09/2017, 2:16 PM

## 2017-05-09 NOTE — Progress Notes (Signed)
[facility]ICINE & REHABILITATION     PROGRESS NOTE    Subjective/Complaints: Up eating breakfast. No problems overnight. Remains in enclosure bed  ROS: Limited due to cognitive/behavioral. .    Objective: Vital Signs: Blood pressure 130/77, pulse 66, temperature 98 F (36.7 C), temperature source Axillary, resp. rate 18, weight 83.1 kg (183 lb 3.2 oz), SpO2 100 %. No results found. Recent Labs    05/08/17 0621  WBC 5.4  HGB 12.2*  HCT 37.1*  PLT 208   Recent Labs    05/08/17 0621  NA 136  K 4.2  CL 102  GLUCOSE 97  BUN 8  CREATININE 0.94  CALCIUM 9.1   CBG (last 3)  No results for input(s): GLUCAP in the last 72 hours.  Wt Readings from Last 3 Encounters:  05/09/17 83.1 kg (183 lb 3.2 oz)  04/24/17 86.1 kg (189 lb 13.1 oz)  02/13/17 91.2 kg (201 lb)    Physical Exam:    Constitutional: No distress . Vital signs reviewed. HEENT: EOMI, oral membranes moist Cardiovascular: RRR without murmur. No JVD    Respiratory: CTA Bilaterally without wheezes or rales. Normal effort    GI: BS +, non-tender, non-distended  Neurological. Alert, follows simple commands. Limited insight HOH.  Motor: strengh 4/5. Skin. Craniotomy sites are clean and dry, cranial deformities Psych: engages but remains distracted  Assessment/Plan: 1. Functional and cognitive deficits secondary to SDH which require 3+ hours per day of interdisciplinary therapy in a comprehensive inpatient rehab setting. Physiatrist is providing close team supervision and 24 hour management of active medical problems listed below. Physiatrist and rehab team continue to assess barriers to discharge/monitor patient progress toward functional and medical goals.  Function:  Bathing Bathing position   Position: Shower  Bathing parts Body parts bathed by patient: Chest, Abdomen, Front perineal area, Right arm, Left arm, Right upper leg, Left upper leg, Buttocks, Right lower leg, Left lower  leg Body parts bathed by helper: Back  Bathing assist Assist Level: Supervision or verbal cues      Upper Body Dressing/Undressing Upper body dressing   What is the patient wearing?: Pull over shirt/dress     Pull over shirt/dress - Perfomed by patient: Put head through opening, Pull shirt over trunk, Thread/unthread right sleeve, Thread/unthread left sleeve Pull over shirt/dress - Perfomed by helper: Thread/unthread left sleeve, Pull shirt over trunk        Upper body assist Assist Level: Supervision or verbal cues, Set up   Set up : To obtain clothing/put away  Lower Body Dressing/Undressing Lower body dressing   What is the patient wearing?: Pants, Non-skid slipper socks, Shoes     Pants- Performed by patient: Thread/unthread left pants leg, Pull pants up/down, Thread/unthread right pants leg Pants- Performed by helper: Thread/unthread right pants leg Non-skid slipper socks- Performed by patient: Don/doff right sock, Don/doff left sock Non-skid slipper socks- Performed by helper: Don/doff left sock Socks - Performed by patient: Don/doff right sock, Don/doff left sock(no cues!)   Shoes - Performed by patient: Don/doff right shoe, Don/doff left shoe Shoes - Performed by helper: Fasten right, Fasten left          Lower body assist Assist for lower body dressing: Supervision or verbal cues, Touching or steadying assistance (Pt > 75%)      Toileting Toileting Toileting activity did not occur: No continent bowel/bladder event Toileting steps completed by patient: Adjust clothing prior to toileting, Adjust clothing after toileting, Performs perineal hygiene Toileting steps  completed by helper: Adjust clothing prior to toileting, Adjust clothing after toileting Toileting Assistive Devices: Grab bar or rail  Toileting assist Assist level: Touching or steadying assistance (Pt.75%)   Transfers Chair/bed transfer   Chair/bed transfer method: Stand pivot Chair/bed transfer  assist level: Touching or steadying assistance (Pt > 75%) Chair/bed transfer assistive device: Armrests, Medical sales representative     Max distance: 90 Assist level: Touching or steadying assistance (Pt > 75%)   Wheelchair       Assist Level: Dependent (Pt equals 0%)  Cognition Comprehension Comprehension assist level: Understands basic 50 - 74% of the time/ requires cueing 25 - 49% of the time  Expression Expression assist level: Expresses basic 25 - 49% of the time/requires cueing 50 - 75% of the time. Uses single words/gestures.  Social Interaction Social Interaction assist level: Interacts appropriately 75 - 89% of the time - Needs redirection for appropriate language or to initiate interaction.  Problem Solving Problem solving assist level: Solves basic 25 - 49% of the time - needs direction more than half the time to initiate, plan or complete simple activities  Memory Memory assist level: Recognizes or recalls 25 - 49% of the time/requires cueing 50 - 75% of the time   Medical Problem List and Plan: 1.Decreased functional mobilitysecondary to right subdural hematoma after fall complicated by acute hypoxic respiratory failure and stridor as well as history of glioblastoma and seizure disorder -CIR PT, OT, SLP ---working toward supervision goals--team conf today 2. DVT Prophylaxis/Anticoagulation/history of heparin-induced thrombocytopenia: SCDs. Arixtra currently on hold due to hematoma 3. Pain Management:Tylenol as needed 4. Mood:Prozac 20 mg daily 5. Neuropsych: This patientisnot fully capable of making decisions on hisown behalf.   -enclosure bed remains required for patient safety 6. Skin/Wound Care:Routine skin checks 7. Fluids/Electrolytes/Nutrition:encourage PO    -good po intake in general     8.Seizure disorder. Vimpat 200 mg twice daily, Keppra 1500 mg twice daily. Follow-up neurology services Dr. Krista Blue as outpatient No recurrent  seizures noted 9.Dysphagia. remains on Dysphagia #1 thin liquids.   -may be an opportunity to upgrade diet- afebrile no cough  10.Hypothyroidism. Synthroid 11.Hyperlipidemia. Lipitor  13.MRSA PCR screening positive. Maintain contact precautions 14.Constipation. Laxative assistance 15. Hyponatremia  Sodium 135 4/22         LOS (Days) [place] EVALUATION WAS PERFORMED  Meredith Staggers, MD 05/09/2017 12:52 PM

## 2017-05-10 ENCOUNTER — Inpatient Hospital Stay (HOSPITAL_COMMUNITY): Payer: Medicare Other | Admitting: Speech Pathology

## 2017-05-10 ENCOUNTER — Inpatient Hospital Stay (HOSPITAL_COMMUNITY): Payer: Medicare Other

## 2017-05-10 ENCOUNTER — Inpatient Hospital Stay (HOSPITAL_COMMUNITY): Payer: Medicare Other | Admitting: Occupational Therapy

## 2017-05-10 NOTE — Progress Notes (Signed)
Social Work Patient ID: [name], male   DOB: [birth year], 47 y.o.   MRN: [id number]   CSW attempted to call pt's mother to update her on team conference discussion and had to leave a message.  CSW will talk with pt and family to determine if d/c to home is feasible with pt at min A level or if they need for CSW to pursue ST SNF.

## 2017-05-10 NOTE — Progress Notes (Signed)
Physical Therapy Note  Patient Details  Name: Glen [name] MRN: [id number] Date of Birth: 1970/11/04 Today's Date: 05/10/2017  0905-1000, 55 min individual tx Pain: none per pt  Stand pivot transfer using armrests and hand grips of [place]ron, close superviison.  neuromuscular re-education via forced use for alternating reciprocal movement x bil LEs sitting and standing on Kinetron, bil UE support, at level 30 in sitting, level 40 in standing, with manual cues for wt shifting.  Attempted standing on wedge with bil UE support, but this was unsafe due to pt's tendency to put feet too high on wedge with immediate LOB backwards.    Gait training on level tile with RW, close supervision on straight path, min guard on turns and when pt distracted by others in hall.  Pt had posterior LOB when distracted.  Therapeutic activity in sitting, reaching out of BOS R/L to promote wt shifting, to retrieve and sort discs by color, 100% accurately.  Pt left resting in w/c with alarm set and quick release belt donned.  At nurse's station for supervision.  See function navigator for current status. Adelynn Gipe 05/10/2017, 7:59 AM

## 2017-05-10 NOTE — Progress Notes (Signed)
Occupational Therapy Session Note  Patient Details  Name: Glen [name] MRN: [id number] Date of Birth: 04/09/1970  Today's Date: 05/10/2017 OT Individual Time: 1530-1600 OT Individual Time Calculation (min): 30 min    Short Term Goals:  Week 3:  OT Short Term Goal 1 (Week 3): STGs=LTGs secondary to upcoming discharge  Skilled Therapeutic Interventions/Progress Updates:    Pt received at RN station with no c/o pain and declined toileting. OT assisted pt via wheelchair into day room for table top task. Pt engaged in leisure interest with small manipulatives. Pt following directions with min verbal cuing and increased time to complete car cut out activity. Pt returning to room at end of session and requesting to return to bed. Pt performed standing with supervision and transferring from wheelchair >bed with overall supervision. Enclosure bed secured and call bell within reach upon exiting the room.   Therapy Documentation Precautions:  Precautions Precautions: Fall Restrictions Weight Bearing Restrictions: No General:   Vital Signs: Therapy Vitals Temp: 98 F (36.7 C) Temp Source: Oral Pulse Rate: 78 Resp: 18 BP: 116/85 Patient Position (if appropriate): Sitting Oxygen Therapy SpO2: 100 % O2 Device: Room Air  See Function Navigator for Current Functional Status.   Therapy/Group: Individual Therapy  Gypsy Decant 05/10/2017, 4:19 PM

## 2017-05-10 NOTE — Patient Care Conference (Signed)
Inpatient RehabilitationTeam Conference and Plan of Care Update Date: 05/09/2017   Time: 2:40 PM    Patient Name: Glen [name]      Medical Record Number: [id number]  Date of Birth: 03-20-1970 Sex: Male         Room/Bed: 4W16C/4W16C-01 Payor Info: Payor: MEDICARE / Plan: MEDICARE PART A AND B / Product Type: *No Product type* /    Admitting Diagnosis: SDH's crani  Admit Date/Time:  04/24/2017  5:09 PM Admission Comments: No comment available   Primary Diagnosis:  <principal problem not specified> Principal Problem: <principal problem not specified>  Patient Active Problem List   Diagnosis Date Noted  . Hyponatremia   . Dysphagia   . Traumatic subdural hematoma ([place]) 04/24/2017  . Abnormal movements   . Subdural hematoma ([place]) 04/09/2017  . Seizures ([place]) 02/13/2017    Expected Discharge Date: Expected Discharge Date: 05/16/17  Team Members Present: Physician leading conference: Dr. Alger Simons Social Worker Present: Alfonse Alpers, LCSW Nurse Present: Junius Creamer, RN PT Present: Roderic Ovens, PT OT Present: Benay Pillow, OT SLP Present: Weston Anna, SLP PPS Coordinator present : Daiva Nakayama, RN, CRRN     Current Status/Progress Goal Weekly Team Focus  Medical   pt has improved from a cognitive standpoint but is quite impulsive and has impaired awareness  improve safety  fall precautions, education for family. nutrition   Bowel/Bladder   continent of bowel and bladder with incontinent episodes of bladder at times LBM   manage bowel and bladder with mod assistance  continue  timed toileting q2 hours while awake   Swallow/Nutrition/ Hydration   Dys. 1 textures with thin liquids, full supervision   min assist   tolerance of current diet, trials of upgraded textures    ADL's   min A functional transfers and balance with toileting and LB clothing management, supervision /set up for UB self care and grooming. Min - mod multimodal cues.   supervision overall and min  A for attention and awareness  cognition, balance, pt/family edu, functional transfers, strengthening.   Mobility   min A gait with RW, transfers, stairs  downgraded to min A for transfers and gait  family ed, d/c planning   Communication   Mod A  min assist   initiation of verbal expression of wants/needs    Safety/Cognition/ Behavioral Observations  Mod A  Min A  problem solving and attention    Pain   faces=0  faces<2  Assess pain q shift and prn   Skin   incision to right side of head OTA healed,   no new skin infection/breakdown with mod assist  Assess skin q shift and prn    Rehab Goals Patient on target to meet rehab goals: Yes Rehab Goals Revised: Many of pt's goals were downgraded to minimal assistance. *See Care Plan and progress notes for long and short-term goals.     Barriers to Discharge  Current Status/Progress Possible Resolutions Date Resolved   Physician    Medical stability;Behavior        ongoing family education, appropriate equipment for safety      Nursing                  PT                    OT                  SLP  SW                Discharge Planning/Teaching Needs:  CSW to discuss with pt's family min A at home vs. short term SNF.    Family education will be offered prior to d/c if family plans to take pt home.   Team Discussion:  Pt does not have any new medical issues/needs.  RN asking to only use Miralax PRN, but keep senna and colace going.  Family has shared with staff that they are unsure if they can care for pt right now, especially with pt's mother just having a heart procedure and father is legally blind.  Pt's brother feels he may even be doing better cognitively than he was at home PTA.  Pt is still on D1 diet and SLP has not been able to upgrade him safely.  Pt has plateaued with therapies and can be close supervision if he's walking in a straight line with no distractions, but is otherwise min A.  Pt is min A overall  with self care and supervision with upper body tasks.  Transfers are min A.  Revisions to Treatment Plan:  none    Continued Need for Acute Rehabilitation Level of Care: The patient requires daily medical management by a physician with specialized training in physical medicine and rehabilitation for the following conditions: Daily direction of a multidisciplinary physical rehabilitation program to ensure safe treatment while eliciting the highest outcome that is of practical value to the patient.: Yes Daily medical management of patient stability for increased activity during participation in an intensive rehabilitation regime.: Yes Daily analysis of laboratory values and/or radiology reports with any subsequent need for medication adjustment of medical intervention for : Neurological problems;Post surgical problems  Lakishia Bourassa, Silvestre Mesi 05/10/2017, 11:10 AM

## 2017-05-10 NOTE — Progress Notes (Signed)
Social Work Patient ID: Glen [name], male   DOB: [birth year], 47 y.o.   MRN: [id number]     [name], [name]  Social Worker    Patient Care Conference  Signed  Date of Service:  05/10/2017 10:48 AM          Signed          Show:Clear all [x] Manual[x] Template[] Copied  Added by: [x] Taira Knabe, Gerline Legacy, LCSW   [] Hover for details   Inpatient RehabilitationTeam Conference and Plan of Care Update Date: 05/09/2017   Time: 2:40 PM      Patient Name: Glen [name]      Medical Record Number: [id number]  Date of Birth: 1970-04-21 Sex: Male         Room/Bed: 4W16C/4W16C-01 Payor Info: Payor: MEDICARE / Plan: MEDICARE PART A AND B / Product Type: *No Product type* /     Admitting Diagnosis: SDH's crani  Admit Date/Time:  04/24/2017  5:09 PM Admission Comments: No comment available    Primary Diagnosis:  <principal problem not specified> Principal Problem: <principal problem not specified>       Patient Active Problem List    Diagnosis Date Noted  . Hyponatremia    . Dysphagia    . Traumatic subdural hematoma ([place]) 04/24/2017  . Abnormal movements    . Subdural hematoma ([place]) 04/09/2017  . Seizures ([place]) 02/13/2017      Expected Discharge Date: Expected Discharge Date: 05/16/17   Team Members Present: Physician leading conference: Dr. Alger Simons Social Worker Present: Alfonse Alpers, LCSW Nurse Present: Junius Creamer, RN PT Present: Roderic Ovens, PT OT Present: Benay Pillow, OT SLP Present: Weston Anna, SLP PPS Coordinator present : Daiva Nakayama, RN, CRRN       Current Status/Progress Goal Weekly Team Focus  Medical     pt has improved from a cognitive standpoint but is quite impulsive and has impaired awareness  improve safety  fall precautions, education for family. nutrition   Bowel/Bladder     continent of bowel and bladder with incontinent episodes of bladder at times LBM   manage bowel and bladder with mod assistance  continue   timed toileting q2 hours while awake   Swallow/Nutrition/ Hydration     Dys. 1 textures with thin liquids, full supervision   min assist   tolerance of current diet, trials of upgraded textures    ADL's     min A functional transfers and balance with toileting and LB clothing management, supervision /set up for UB self care and grooming. Min - mod multimodal cues.   supervision overall and min A for attention and awareness  cognition, balance, pt/family edu, functional transfers, strengthening.   Mobility     min A gait with RW, transfers, stairs  downgraded to min A for transfers and gait  family ed, d/c planning   Communication     Mod A  min assist   initiation of verbal expression of wants/needs    Safety/Cognition/ Behavioral Observations   Mod A  Min A  problem solving and attention    Pain     faces=0  faces<2  Assess pain q shift and prn   Skin     incision to right side of head OTA healed,   no new skin infection/breakdown with mod assist  Assess skin q shift and prn     Rehab Goals Patient on target to meet rehab goals: Yes Rehab Goals Revised: Many of pt's goals were downgraded to minimal assistance. *  See Care Plan and progress notes for long and short-term goals.      Barriers to Discharge   Current Status/Progress Possible Resolutions Date Resolved   Physician     Medical stability;Behavior        ongoing family education, appropriate equipment for safety      Nursing                 PT                    OT                 SLP            SW              Discharge Planning/Teaching Needs:  CSW to discuss with pt's family min A at home vs. short term SNF.    Family education will be offered prior to d/c if family plans to take pt home.   Team Discussion:  Pt does not have any new medical issues/needs.  RN asking to only use Miralax PRN, but keep senna and colace going.  Family has shared with staff that they are unsure if they can care for pt right now,  especially with pt's mother just having a heart procedure and father is legally blind.  Pt's brother feels he may even be doing better cognitively than he was at home PTA.  Pt is still on D1 diet and SLP has not been able to upgrade him safely.  Pt has plateaued with therapies and can be close supervision if he's walking in a straight line with no distractions, but is otherwise min A.  Pt is min A overall with self care and supervision with upper body tasks.  Transfers are min A.  Revisions to Treatment Plan:  none    Continued Need for Acute Rehabilitation Level of Care: The patient requires daily medical management by a physician with specialized training in physical medicine and rehabilitation for the following conditions: Daily direction of a multidisciplinary physical rehabilitation program to ensure safe treatment while eliciting the highest outcome that is of practical value to the patient.: Yes Daily medical management of patient stability for increased activity during participation in an intensive rehabilitation regime.: Yes Daily analysis of laboratory values and/or radiology reports with any subsequent need for medication adjustment of medical intervention for : Neurological problems;Post surgical problems   [name], Silvestre Mesi 05/10/2017, 11:10 AM

## 2017-05-10 NOTE — Progress Notes (Signed)
Speech Language Pathology Daily Session Note  Patient Details  Name: Glen [name] MRN: [id number] Date of Birth: 11-17-1970  Today's Date: 05/10/2017 SLP Individual Time: 1130-1200 SLP Individual Time Calculation (min): 30 min  Short Term Goals: Week 3: SLP Short Term Goal 1 (Week 3): Pt will consume dys. 1 textures and thin liquids with Min A verbal cues for use of swallowing precautions and minimal overt s/s of aspiration over 3 consecutive sessions.  SLP Short Term Goal 2 (Week 3): Pt will consume trials of dys. 2 textures without overt s/s of aspiration and with efficient mastication and minimal oral residue with Mod A verbal cues for use of compensatory swallowing strategies over 2 sessions prior to upgrade.  SLP Short Term Goal 3 (Week 3): Pt will complete basic, familiar problem solving tasks with Min A multimodal cues.   SLP Short Term Goal 4 (Week 3): Pt will locate items to the left of midline in 75% of opportunities during basic, familiar tasks with Min A multimodal cues.   SLP Short Term Goal 5 (Week 3): Pt will demonstrate selective attention in a mildly distracting environment for ~45 minutes with Min A verbal cues for redirection.   SLP Short Term Goal 6 (Week 3): Pt will verbalize at the word level to convey needs and wants to caregivers with Min A multimodal cues.    Skilled Therapeutic Interventions: Skilled treatment session focused on cognitive and dysphagia goals. SLP facilitated session by providing Max A verbal and visual cues for problem solving during an unfinished basic puzzle task. Pt demonstrated attention to left field of environment with Min A verbal cues throughout task. SLP also facilitated session by administering trials of dys. 2 textures. Pt consumed dys. 2 textures without overt s/s of aspiration and demonstrated complete oral clearance with use of liquid wash with Supervision verbal cues. However, prolonged mastication was noted, suspect due to lack of  dentition or neuromuscular incoordination. Therefore, recommend trial tray of dys. 2 textures with SLP prior to upgrade. Pt left upright in wheelchair at nurses station. Continue with current plan of care.      Function:  Eating Eating   Modified Consistency Diet: Yes Eating Assist Level: Set up assist for;Supervision or verbal cues;More than reasonable amount of time   Eating Set Up Assist For: Opening containers       Cognition Comprehension Comprehension assist level: Understands basic 50 - 74% of the time/ requires cueing 25 - 49% of the time  Expression   Expression assist level: Expresses basic 25 - 49% of the time/requires cueing 50 - 75% of the time. Uses single words/gestures.  Social Interaction Social Interaction assist level: Interacts appropriately 75 - 89% of the time - Needs redirection for appropriate language or to initiate interaction.  Problem Solving Problem solving assist level: Solves basic 25 - 49% of the time - needs direction more than half the time to initiate, plan or complete simple activities  Memory Memory assist level: Recognizes or recalls 25 - 49% of the time/requires cueing 50 - 75% of the time    Pain Pain Assessment Pain Score: 0-No pain  Therapy/Group: Individual Therapy  Meredeth Ide  SLP - Student 05/10/2017, 2:40 PM

## 2017-05-10 NOTE — Progress Notes (Signed)
Occupational Therapy Weekly Progress Note  Patient Details  Name: Glen [name] MRN: [id number] Date of Birth: [birth year]  Beginning of progress report period: May 03, 2017 End of progress report period: May 10, 2017  Today's Date: 05/10/2017 OT Individual Time: 4239-5320 OT Individual Time Calculation (min): 71 min    Patient has met 3 of 3 short term goals. Pt making slower progress towards goals this week. Pt currently ambulating and performing all functional transfers with use of RW and min A for safety. Pt needing mod multimodal cues for safety, initiation, and sequence with function tasks. Pt has been bathing from seated level with overall supervision and donning clothing items with steady assistance for standing balance. Pt continues to benefit from OT intervention.   Patient continues to demonstrate the following deficits: muscle weakness, decreased cardiorespiratoy endurance, motor apraxia, ataxia, decreased coordination and decreased motor planning, decreased initiation, decreased attention, decreased awareness, decreased problem solving, decreased safety awareness, decreased memory and delayed processing and decreased sitting balance, decreased standing balance and decreased balance strategies and therefore will continue to benefit from skilled OT intervention to enhance overall performance with BADL.  Patient progressing toward long term goals..  Continue plan of care. goals downgraded to min A on 05/09/17 secondary to pts progress and safety concerns.  OT Short Term Goals Week 2:  OT Short Term Goal 1 (Week 2): Pt will perform shower transfer at min A. OT Short Term Goal 1 - Progress (Week 2): Met OT Short Term Goal 2 (Week 2): Pt will perform toileting at steady assist level for balance during LB dressing and hygiene. OT Short Term Goal 2 - Progress (Week 2): Met OT Short Term Goal 3 (Week 2): Pt will perform toilet transfer with min A and use of RW.  OT Short Term Goal 3  - Progress (Week 2): Met Week 3:  OT Short Term Goal 1 (Week 3): STGs=LTGs secondary to upcoming discharge  Skilled Therapeutic Interventions/Progress Updates:    Pt supine in enclosure bed sleeping but agreeable to OT intervention. Pt standing from bed and ambulating into bathroom with steady assistance and use of RW to bathroom. Pt performed toileting and then donning clean clothing while seated on toilet. Pt needing min A for standing balance for LB clothing management. Pt ambulating in same manner as above to wheelchair for breakfast. Pt washing hands with demonstrational cues and set up to obtain needed items. Pt eating breakfast with set up A to open containers and cut food. Pt needing min cues to eat slowly, take small sips, and small bites. Pt taken to RN station at end of session for safety. Quick release belt donned and chair alarm activated.   Therapy Documentation Precautions:  Precautions Precautions: Fall Restrictions Weight Bearing Restrictions: No  See Function Navigator for Current Functional Status.   Therapy/Group: Individual Therapy  Gypsy Decant 05/10/2017, 7:58 AM

## 2017-05-10 NOTE — Plan of Care (Signed)
  Problem: RH BOWEL ELIMINATION Goal: RH STG MANAGE BOWEL WITH ASSISTANCE Description STG Manage Bowel with mod Assistance.  Outcome: Progressing   Problem: RH BLADDER ELIMINATION Goal: RH STG MANAGE BLADDER WITH ASSISTANCE Description STG Manage Bladder With mod Assistance  Outcome: Progressing   Problem: RH SAFETY Goal: RH STG ADHERE TO SAFETY PRECAUTIONS W/ASSISTANCE/DEVICE Description STG Adhere to Safety Precautions With mod  Assistance/Device.  Outcome: Progressing Goal: RH STG DECREASED RISK OF FALL WITH ASSISTANCE Description STG Decreased Risk of Fall With mod Assistance.  Outcome: Progressing   Problem: RH COGNITION-NURSING Goal: RH STG USES MEMORY AIDS/STRATEGIES W/ASSIST TO PROBLEM SOLVE Description STG Uses Memory Aids/Strategies With mod Assistance to Problem Solve.  Outcome: Progressing

## 2017-05-10 NOTE — Progress Notes (Signed)
[facility]IC[facility] & REHABILITATION     PROGRESS NOTE    Subjective/Complaints: Up eating breakfast. No new issues  ROS: Limited due to cognitive/behavioral.    Objective: Vital Signs: Blood pressure (!) 136/91, pulse 71, temperature 98.2 F (36.8 C), temperature source Oral, resp. rate 19, weight 83.2 kg (183 lb 6.8 oz), SpO2 99 %. No results found. Recent Labs    05/08/17 0621  WBC 5.4  HGB 12.2*  HCT 37.1*  PLT 208   Recent Labs    05/08/17 0621  NA 136  K 4.2  CL 102  GLUCOSE 97  BUN 8  CREATININE 0.94  CALCIUM 9.1   CBG (last 3)  No results for input(s): GLUCAP in the last 72 hours.  Wt Readings from Last 3 Encounters:  05/10/17 83.2 kg (183 lb 6.8 oz)  04/24/17 86.1 kg (189 lb 13.1 oz)  02/13/17 91.2 kg (201 lb)    Physical Exam:    Constitutional: No distress . Vital signs reviewed. HEENT: EOMI, oral membranes moist Cardiovascular: RRR without murmur. No JVD    Respiratory: CTA Bilaterally without wheezes or rales. Normal effort    GI: BS +, non-tender, non-distended  Neurological. Alert, follows simple commands. Limited insight HOH.  Motor: strengh 4/5. Skin. Craniotomy sites are clean and dry, cranial deformities Psych: engages but remains distracted  Assessment/Plan: 1. Functional and cognitive deficits secondary to SDH which require 3+ hours per day of interdisciplinary therapy in a comprehensive inpatient rehab setting. Physiatrist is providing close team supervision and 24 hour management of active medical problems listed below. Physiatrist and rehab team continue to assess barriers to discharge/monitor patient progress toward functional and medical goals.  Function:  Bathing Bathing position   Position: Shower  Bathing parts Body parts bathed by patient: Chest, Abdomen, Front perineal area, Right arm, Left arm, Right upper leg, Left upper leg, Buttocks, Right lower leg, Left lower leg Body parts bathed by helper: Back   Bathing assist Assist Level: Supervision or verbal cues      Upper Body Dressing/Undressing Upper body dressing   What is the patient wearing?: Pull over shirt/dress     Pull over shirt/dress - Perfomed by patient: Put head through opening, Pull shirt over trunk, Thread/unthread right sleeve, Thread/unthread left sleeve Pull over shirt/dress - Perfomed by helper: Thread/unthread left sleeve, Pull shirt over trunk        Upper body assist Assist Level: Supervision or verbal cues, Set up   Set up : To obtain clothing/put away  Lower Body Dressing/Undressing Lower body dressing   What is the patient wearing?: Pants     Pants- Performed by patient: Thread/unthread left pants leg, Pull pants up/down, Thread/unthread right pants leg Pants- Performed by helper: Thread/unthread right pants leg Non-skid slipper socks- Performed by patient: Don/doff right sock, Don/doff left sock Non-skid slipper socks- Performed by helper: Don/doff left sock Socks - Performed by patient: Don/doff right sock, Don/doff left sock(no cues!)   Shoes - Performed by patient: Don/doff right shoe, Don/doff left shoe Shoes - Performed by helper: Fasten right, Fasten left          Lower body assist Assist for lower body dressing: Supervision or verbal cues, Touching or steadying assistance (Pt > 75%)      Toileting Toileting Toileting activity did not occur: No continent bowel/bladder event Toileting steps completed by patient: Adjust clothing prior to toileting, Adjust clothing after toileting, Performs perineal hygiene Toileting steps completed by helper: Adjust clothing prior to toileting,  Adjust clothing after toileting Toileting Assistive Devices: Grab bar or rail  Toileting assist Assist level: Touching or steadying assistance (Pt.75%)   Transfers Chair/bed transfer   Chair/bed transfer method: Stand pivot Chair/bed transfer assist level: Touching or steadying assistance (Pt > 75%) Chair/bed  transfer assistive device: Armrests, Medical sales representative     Max distance: 15' Assist level: Touching or steadying assistance (Pt > 75%)   Wheelchair       Assist Level: Dependent (Pt equals 0%)  Cognition Comprehension Comprehension assist level: Understands basic 50 - 74% of the time/ requires cueing 25 - 49% of the time  Expression Expression assist level: Expresses basic 25 - 49% of the time/requires cueing 50 - 75% of the time. Uses single words/gestures.  Social Interaction Social Interaction assist level: Interacts appropriately 75 - 89% of the time - Needs redirection for appropriate language or to initiate interaction.  Problem Solving Problem solving assist level: Solves basic 25 - 49% of the time - needs direction more than half the time to initiate, plan or complete simple activities  Memory Memory assist level: Recognizes or recalls 25 - 49% of the time/requires cueing 50 - 75% of the time   Medical Problem List and Plan: 1.Decreased functional mobilitysecondary to right subdural hematoma after fall complicated by acute hypoxic respiratory failure and stridor as well as history of glioblastoma and seizure disorder -CIR PT, OT, SLP ---working toward supervision goals-family concerns about managing him at home 2. DVT Prophylaxis/Anticoagulation/history of heparin-induced thrombocytopenia: SCDs. Arixtra currently on hold due to hematoma 3. Pain Management:Tylenol as needed 4. Mood:Prozac 20 mg daily 5. Neuropsych: This patientisnot fully capable of making decisions on hisown behalf.   -enclosure bed remains required for patient safety 6. Skin/Wound Care:Routine skin checks 7. Fluids/Electrolytes/Nutrition:encourage PO    -good po intake in general     8.Seizure disorder. Vimpat 200 mg twice daily, Keppra 1500 mg twice daily. Follow-up neurology services Dr. Krista Blue as outpatient No recurrent seizures noted 9.Dysphagia. remains on  Dysphagia #1 thin liquids.   -may be an opportunity to upgrade diet- afebrile no cough  10.Hypothyroidism. Synthroid 11.Hyperlipidemia. Lipitor  13.MRSA PCR screening positive. Maintain contact precautions 14.Constipation. Laxative assistance 15. Hyponatremia  Sodium 135 4/22         LOS (Days) [place] EVALUATION WAS PERFORMED  Meredith Staggers, MD 05/10/2017 10:19 AM

## 2017-05-11 ENCOUNTER — Inpatient Hospital Stay (HOSPITAL_COMMUNITY): Payer: Medicare Other | Admitting: Speech Pathology

## 2017-05-11 ENCOUNTER — Inpatient Hospital Stay (HOSPITAL_COMMUNITY): Payer: Medicare Other

## 2017-05-11 ENCOUNTER — Inpatient Hospital Stay (HOSPITAL_COMMUNITY): Payer: Medicare Other | Admitting: Occupational Therapy

## 2017-05-11 ENCOUNTER — Inpatient Hospital Stay (HOSPITAL_COMMUNITY): Payer: Medicare Other | Admitting: Physical Therapy

## 2017-05-11 NOTE — Progress Notes (Signed)
Physical Therapy Note  Patient Details  Name: Glen [name] MRN: [id number] Date of Birth: [birth year] Today's Date: 05/11/2017    Time: 1300-1400 60 minutes  1:1 No c/o pain.  Pt performs gait with min A with RW,min/mod A when walking up incline to bathroom and when stepping backwards.  Pt performs toilet transfers and clothing management with min A with RW.  Pt performs handwashing with steadying assist for balance, mod/max cuing for scanning environment to find soap and paper towels.  Seated balance with ball toss and ball kick with pt close supervision for seated balance.  Pt improves UE and LE coordination with repetition of tasks.  Standing horseshoe toss with supervision with 1 UE support on RW.  Visual scanning with puzzle task with pt requiring max/total A for scanning in a mod distracting environment.  Pt left at nurses station with quick release belt and alarm set.   Kit Brubacher 05/11/2017, 2:26 PM

## 2017-05-11 NOTE — Progress Notes (Signed)
[facility] PHYSICAL MEDICINE & REHABILITATION     PROGRESS NOTE    Subjective/Complaints: Patient up at nurses station today.  No new issues per nurse.  Still impulsive at times.  ROS: Limited due to cognitive/behavioral    Objective: Vital Signs: Blood pressure 122/88, pulse 75, temperature (!) 97.5 F (36.4 C), temperature source Oral, resp. rate 18, weight 83.2 kg (183 lb 6.8 oz), SpO2 100 %. No results found. No results for input(s): WBC, HGB, HCT, PLT in the last 72 hours. No results for input(s): NA, K, CL, GLUCOSE, BUN, CREATININE, CALCIUM in the last 72 hours.  Invalid input(s): CO CBG (last 3)  No results for input(s): GLUCAP in the last 72 hours.  Wt Readings from Last 3 Encounters:  05/10/17 83.2 kg (183 lb 6.8 oz)  04/24/17 86.1 kg (189 lb 13.1 oz)  02/13/17 91.2 kg (201 lb)    Physical Exam:    Constitutional: No distress . Vital signs reviewed. HEENT: EOMI, oral membranes moist Neck: supple Cardiovascular: RRR without murmur. No JVD    Respiratory: CTA Bilaterally without wheezes or rales. Normal effort    GI: BS +, non-tender, non-distended  Neurological. Alert, follows simple commands. Limited insight HOH.  Motor: strengh 4/5. Skin. Craniotomy sites are clean and dry, cranial deformities Psych: engages but remains distracted  Assessment/Plan: 1. Functional and cognitive deficits secondary to SDH which require 3+ hours per day of interdisciplinary therapy in a comprehensive inpatient rehab setting. Physiatrist is providing close team supervision and 24 hour management of active medical problems listed below. Physiatrist and rehab team continue to assess barriers to discharge/monitor patient progress toward functional and medical goals.  Function:  Bathing Bathing position   Position: Shower  Bathing parts Body parts bathed by patient: Chest, Abdomen, Front perineal area, Right arm, Left arm, Right upper leg, Left upper leg, Buttocks, Right lower  leg, Left lower leg Body parts bathed by helper: Back  Bathing assist Assist Level: Supervision or verbal cues      Upper Body Dressing/Undressing Upper body dressing   What is the patient wearing?: Pull over shirt/dress     Pull over shirt/dress - Perfomed by patient: Put head through opening, Pull shirt over trunk, Thread/unthread right sleeve, Thread/unthread left sleeve Pull over shirt/dress - Perfomed by helper: Thread/unthread left sleeve, Pull shirt over trunk        Upper body assist Assist Level: Supervision or verbal cues, Set up   Set up : To obtain clothing/put away  Lower Body Dressing/Undressing Lower body dressing   What is the patient wearing?: Pants     Pants- Performed by patient: Thread/unthread left pants leg, Pull pants up/down, Thread/unthread right pants leg Pants- Performed by helper: Thread/unthread right pants leg Non-skid slipper socks- Performed by patient: Don/doff right sock, Don/doff left sock Non-skid slipper socks- Performed by helper: Don/doff left sock Socks - Performed by patient: Don/doff right sock, Don/doff left sock(no cues!)   Shoes - Performed by patient: Don/doff right shoe, Don/doff left shoe Shoes - Performed by helper: Fasten right, Fasten left          Lower body assist Assist for lower body dressing: Supervision or verbal cues, Touching or steadying assistance (Pt > 75%)      Toileting Toileting Toileting activity did not occur: No continent bowel/bladder event Toileting steps completed by patient: Adjust clothing prior to toileting, Adjust clothing after toileting, Performs perineal hygiene Toileting steps completed by helper: Adjust clothing prior to toileting, Adjust clothing after toileting Toileting  Assistive Devices: Grab bar or rail  Toileting assist Assist level: Touching or steadying assistance (Pt.75%)   Transfers Chair/bed transfer   Chair/bed transfer method: Ambulatory Chair/bed transfer assist level:  Touching or steadying assistance (Pt > 75%) Chair/bed transfer assistive device: Armrests, Medical sales representative     Max distance: 150 Assist level: Touching or steadying assistance (Pt > 75%)   Wheelchair       Assist Level: Dependent (Pt equals 0%)  Cognition Comprehension Comprehension assist level: Understands basic 50 - 74% of the time/ requires cueing 25 - 49% of the time  Expression Expression assist level: Expresses basic 25 - 49% of the time/requires cueing 50 - 75% of the time. Uses single words/gestures.  Social Interaction Social Interaction assist level: Interacts appropriately 75 - 89% of the time - Needs redirection for appropriate language or to initiate interaction.  Problem Solving Problem solving assist level: Solves basic 25 - 49% of the time - needs direction more than half the time to initiate, plan or complete simple activities  Memory Memory assist level: Recognizes or recalls 25 - 49% of the time/requires cueing 50 - 75% of the time   Medical Problem List and Plan: 1.Decreased functional mobilitysecondary to right subdural hematoma after fall complicated by acute hypoxic respiratory failure and stridor as well as history of glioblastoma and seizure disorder -CIR PT, OT, SLP ---ELOS 4/30 2. DVT Prophylaxis/Anticoagulation/history of heparin-induced thrombocytopenia: SCDs. Arixtra currently on hold due to hematoma 3. Pain Management:Tylenol as needed 4. Mood:Prozac 20 mg daily 5. Neuropsych: This patientisnot fully capable of making decisions on hisown behalf.   -enclosure bed is required for patient safety 6. Skin/Wound Care:Routine skin checks 7. Fluids/Electrolytes/Nutrition:encourage PO    -good po intake in general     8.Seizure disorder. Vimpat 200 mg twice daily, Keppra 1500 mg twice daily. Follow-up neurology services Dr. Krista Blue as outpatient No recurrent seizures noted 9.Dysphagia. remains on Dysphagia #1 thin  liquids.   -may be an opportunity to upgrade diet-does not appear to be having any difficulties with tolerance of current diet 10.Hypothyroidism. Synthroid 11.Hyperlipidemia. Lipitor  13.MRSA PCR screening positive. Maintain contact precautions 14.Constipation. Laxative assistance 15. Hyponatremia  Sodium 135 4/22--recheck next week         LOS (Days) [place]  Meredith Staggers, MD 05/11/2017 10:21 AM

## 2017-05-11 NOTE — Progress Notes (Signed)
Occupational Therapy Session Note  Patient Details  Name: Glen [name] MRN: [id number] Date of Birth: 03/12/1970  Today's Date: 05/11/2017 OT Individual Time: 0998-3382 OT Individual Time Calculation (min): 70 min    Short Term Goals: Week 3:  OT Short Term Goal 1 (Week 3): STGs=LTGs secondary to upcoming discharge  Skilled Therapeutic Interventions/Progress Updates:    Upon entering the room, pt sleeping in enclosure bed. Pt agreeable to OT intervention and with no c/o, signs, or symptoms pain during session. Pt ambulates to bathroom with steady assistance and use of RW. Pt declined toileting. Pt transferred onto TTB for bathing at shower level at overall supervision with use of lateral leans to wash buttocks. Pt standing and transferring onto commode to dressing with min assistance and min gestural cues. Pt donning clothing items with increased time and min multimodal cues. Pt seated in wheelchair for breakfast with set up A to open containers and cut food items. NT arrived to take over supervision at therapist exit. Pt in wheelchair with chair alarm activated and quick release belt donned.   Therapy Documentation Precautions:  Precautions Precautions: Fall Restrictions Weight Bearing Restrictions: No   Pain: Pain Assessment Pain Scale: 0-10 Pain Score: 0-No pain  See Function Navigator for Current Functional Status.   Therapy/Group: Individual Therapy  Gypsy Decant 05/11/2017, 12:18 PM

## 2017-05-11 NOTE — Progress Notes (Addendum)
Physical Therapy Note  Patient Details  Name: GLENDAL CASSADAY MRN: [id number] Date of Birth: 06-Jan-1971 Today's Date: 05/11/2017  0920-1005, 45 min individual tx Pain: none per pt  neuromuscular re-education via forced use, multimodal cues for alternating reciprocal movement x bil LEs, seated in w/c using Kinetron at level 20, x 3 minutes, x 1 min. PT set up and demo'd self stretching hamstrings and heel cords in sitting, using footstool.  Pt needed assistance for LLE, which is tighter than R.  Gait training on level tile x 150' with close supervision> min assist on straight path without distractions, but mod assist for turn to L when [place], SLP (well known to pt) approached him from the opposite direction, and when turning L into busy therapy room as wheel of RW bumped door frame. Pt continues to have LOB, appearing to be from ataxia, with sit> stand at times, needing up to mod assist.    Therapeutic activity in standing, using R hand to match playing cards on vertical board in front of him, at eye height.  No UE support, min assist for balance.  Pt needed visual and auditory cues to match 8/9 cards, with the most difficulty locating matches on the L of the board.  Pt would benefit from family ed for stretching hamstrings in sitting.  On 4/22, pt's mother expressed interest in use of a gait belt around pt while he is in a chair, for safety at home when she has to walk out of the room briefly.  PT directed family to look for fabric gait belt which should be in pt's room.  Pt left resting in wc with alarm set, quick release belt donned, and at Nurse's station for supervision.  See function navigator for current status.  Marita Burnsed 05/11/2017, 7:50 AM

## 2017-05-11 NOTE — Progress Notes (Signed)
Speech Language Pathology Daily Session Note  Patient Details  Name: Glen [name] MRN: [id number] Date of Birth: 18-Sep-1970  Today's Date: 05/11/2017 SLP Individual Time: 9381-0175 SLP Individual Time Calculation (min): 25 min  Short Term Goals: Week 3: SLP Short Term Goal 1 (Week 3): Pt will consume dys. 1 textures and thin liquids with Min A verbal cues for use of swallowing precautions and minimal overt s/s of aspiration over 3 consecutive sessions.  SLP Short Term Goal 2 (Week 3): Pt will consume trials of dys. 2 textures without overt s/s of aspiration and with efficient mastication and minimal oral residue with Mod A verbal cues for use of compensatory swallowing strategies over 2 sessions prior to upgrade.  SLP Short Term Goal 3 (Week 3): Pt will complete basic, familiar problem solving tasks with Min A multimodal cues.   SLP Short Term Goal 4 (Week 3): Pt will locate items to the left of midline in 75% of opportunities during basic, familiar tasks with Min A multimodal cues.   SLP Short Term Goal 5 (Week 3): Pt will demonstrate selective attention in a mildly distracting environment for ~45 minutes with Min A verbal cues for redirection.   SLP Short Term Goal 6 (Week 3): Pt will verbalize at the word level to convey needs and wants to caregivers with Min A multimodal cues.    Skilled Therapeutic Interventions: Skilled treatment session focused on dysphagia and cognitive goals. SLP facilitated session by administering trials of Dys. 2 textures. Patient demonstrated mildly prolonged but efficient mastication with complete oral clearance. Patient with one overt cough, suspect due to incoordination of breathing while drinking thin liquids via cup.  Recommend trial tray tomorrow prior to upgrade. Patient requested to go back to his room at end of session and required Max A multimodal cues to for safety in regards to impulsivity while attempting to transfer to bed without assistance. Patient  left in wheelchair with NT present. Continue with current plan of care.      Function:  Eating Eating   Modified Consistency Diet: Yes Eating Assist Level: Set up assist for;Supervision or verbal cues;More than reasonable amount of time   Eating Set Up Assist For: Opening containers       Cognition Comprehension Comprehension assist level: Understands basic 50 - 74% of the time/ requires cueing 25 - 49% of the time  Expression   Expression assist level: Expresses basic 25 - 49% of the time/requires cueing 50 - 75% of the time. Uses single words/gestures.  Social Interaction Social Interaction assist level: Interacts appropriately 75 - 89% of the time - Needs redirection for appropriate language or to initiate interaction.  Problem Solving Problem solving assist level: Solves basic 25 - 49% of the time - needs direction more than half the time to initiate, plan or complete simple activities  Memory Memory assist level: Recognizes or recalls 25 - 49% of the time/requires cueing 50 - 75% of the time    Pain No/Denies Pain   Therapy/Group: Individual Therapy  Beonka Amesquita 05/11/2017, 3:16 PM

## 2017-05-11 NOTE — Plan of Care (Signed)
  Problem: RH BOWEL ELIMINATION Goal: RH STG MANAGE BOWEL WITH ASSISTANCE Description STG Manage Bowel with mod Assistance.  Outcome: Progressing   Problem: RH BLADDER ELIMINATION Goal: RH STG MANAGE BLADDER WITH ASSISTANCE Description STG Manage Bladder With mod Assistance  Outcome: Progressing   Problem: RH SAFETY Goal: RH STG ADHERE TO SAFETY PRECAUTIONS W/ASSISTANCE/DEVICE Description STG Adhere to Safety Precautions With mod  Assistance/Device.  Outcome: Progressing Goal: RH STG DECREASED RISK OF FALL WITH ASSISTANCE Description STG Decreased Risk of Fall With mod Assistance.  Outcome: Progressing   Problem: RH COGNITION-NURSING Goal: RH STG USES MEMORY AIDS/STRATEGIES W/ASSIST TO PROBLEM SOLVE Description STG Uses Memory Aids/Strategies With mod Assistance to Problem Solve.  Outcome: Progressing

## 2017-05-12 ENCOUNTER — Inpatient Hospital Stay (HOSPITAL_COMMUNITY): Payer: Medicare Other | Admitting: Physical Therapy

## 2017-05-12 ENCOUNTER — Inpatient Hospital Stay (HOSPITAL_COMMUNITY): Payer: Medicare Other | Admitting: Occupational Therapy

## 2017-05-12 ENCOUNTER — Ambulatory Visit (HOSPITAL_COMMUNITY): Payer: Medicare Other | Admitting: Speech Pathology

## 2017-05-12 ENCOUNTER — Inpatient Hospital Stay (HOSPITAL_COMMUNITY): Payer: Medicare Other

## 2017-05-12 NOTE — Progress Notes (Signed)
[facility] PHYSICAL MEDICINE & REHABILITATION     PROGRESS NOTE    Subjective/Complaints: Sitting in bed. Denies any pain.   ROS: Limited due to cognitive/behavioral     Objective: Vital Signs: Blood pressure 113/85, pulse 85, temperature 97.9 F (36.6 C), temperature source Oral, resp. rate 18, weight 80 kg (176 lb 4.8 oz), SpO2 100 %. No results found. No results for input(s): WBC, HGB, HCT, PLT in the last 72 hours. No results for input(s): NA, K, CL, GLUCOSE, BUN, CREATININE, CALCIUM in the last 72 hours.  Invalid input(s): CO CBG (last 3)  No results for input(s): GLUCAP in the last 72 hours.  Wt Readings from Last 3 Encounters:  05/12/17 80 kg (176 lb 4.8 oz)  04/24/17 86.1 kg (189 lb 13.1 oz)  02/13/17 91.2 kg (201 lb)    Physical Exam:    Constitutional: No distress . Vital signs reviewed. HEENT: EOMI, oral membranes moist Neck: supple Cardiovascular: RRR without murmur. No JVD    Respiratory: CTA Bilaterally without wheezes or rales. Normal effort    GI: BS +, non-tender, non-distended  Neurological. Alert, follows simple commands. Limited insight HOH.  Motor: strengh 4/5. Skin. Craniotomy sites are clean and dry, cranial deformities present Psych: engages but remains distracted--no change  Assessment/Plan: 1. Functional and cognitive deficits secondary to SDH which require 3+ hours per day of interdisciplinary therapy in a comprehensive inpatient rehab setting. Physiatrist is providing close team supervision and 24 hour management of active medical problems listed below. Physiatrist and rehab team continue to assess barriers to discharge/monitor patient progress toward functional and medical goals.  Function:  Bathing Bathing position   Position: Shower  Bathing parts Body parts bathed by patient: Chest, Abdomen, Front perineal area, Right arm, Left arm, Right upper leg, Left upper leg, Buttocks, Right lower leg, Left lower leg Body parts bathed by  helper: Back  Bathing assist Assist Level: Supervision or verbal cues      Upper Body Dressing/Undressing Upper body dressing   What is the patient wearing?: Pull over shirt/dress     Pull over shirt/dress - Perfomed by patient: Put head through opening, Pull shirt over trunk, Thread/unthread right sleeve, Thread/unthread left sleeve Pull over shirt/dress - Perfomed by helper: Thread/unthread left sleeve, Pull shirt over trunk        Upper body assist Assist Level: Supervision or verbal cues, Set up   Set up : To obtain clothing/put away  Lower Body Dressing/Undressing Lower body dressing   What is the patient wearing?: Pants     Pants- Performed by patient: Thread/unthread left pants leg, Pull pants up/down, Thread/unthread right pants leg Pants- Performed by helper: Thread/unthread right pants leg Non-skid slipper socks- Performed by patient: Don/doff right sock, Don/doff left sock Non-skid slipper socks- Performed by helper: Don/doff left sock Socks - Performed by patient: Don/doff right sock, Don/doff left sock(no cues!)   Shoes - Performed by patient: Don/doff right shoe, Don/doff left shoe Shoes - Performed by helper: Fasten right, Fasten left          Lower body assist Assist for lower body dressing: Supervision or verbal cues, Touching or steadying assistance (Pt > 75%)      Toileting Toileting Toileting activity did not occur: No continent bowel/bladder event Toileting steps completed by patient: Adjust clothing prior to toileting, Adjust clothing after toileting, Performs perineal hygiene Toileting steps completed by helper: Adjust clothing prior to toileting, Adjust clothing after toileting Toileting Assistive Devices: Grab bar or rail  Toileting  assist Assist level: Touching or steadying assistance (Pt.75%)   Transfers Chair/bed transfer   Chair/bed transfer method: Ambulatory Chair/bed transfer assist level: Touching or steadying assistance (Pt >  75%) Chair/bed transfer assistive device: Armrests, Medical sales representative     Max distance: 150 Assist level: Touching or steadying assistance (Pt > 75%)   Wheelchair       Assist Level: Dependent (Pt equals 0%)  Cognition Comprehension Comprehension assist level: Understands basic 50 - 74% of the time/ requires cueing 25 - 49% of the time  Expression Expression assist level: Expresses basic 25 - 49% of the time/requires cueing 50 - 75% of the time. Uses single words/gestures.  Social Interaction Social Interaction assist level: Interacts appropriately 75 - 89% of the time - Needs redirection for appropriate language or to initiate interaction.  Problem Solving Problem solving assist level: Solves basic 25 - 49% of the time - needs direction more than half the time to initiate, plan or complete simple activities  Memory Memory assist level: Recognizes or recalls 25 - 49% of the time/requires cueing 50 - 75% of the time   Medical Problem List and Plan: 1.Decreased functional mobilitysecondary to right subdural hematoma after fall complicated by acute hypoxic respiratory failure and stridor as well as history of glioblastoma and seizure disorder -CIR PT, OT, SLP ---ELOS 4/30--family ed 2. DVT Prophylaxis/Anticoagulation/history of heparin-induced thrombocytopenia: SCDs. Arixtra currently on hold due to hematoma 3. Pain Management:Tylenol as needed 4. Mood:Prozac 20 mg daily 5. Neuropsych: This patientisnot fully capable of making decisions on hisown behalf.   -enclosure bed is required for patient safety 6. Skin/Wound Care:Routine skin checks 7. Fluids/Electrolytes/Nutrition:encourage PO    -good po intake in general     8.Seizure disorder. Vimpat 200 mg twice daily, Keppra 1500 mg twice daily. Follow-up neurology services Dr. Krista Blue as outpatient No recurrent seizures noted 9.Dysphagia. remains on Dysphagia #1 thin liquids.   -may be  an opportunity to upgrade diet-does not appear to be having any difficulties with tolerance of current diet 10.Hypothyroidism. Synthroid 11.Hyperlipidemia. Lipitor  13.MRSA PCR screening positive. Maintain contact precautions 14.Constipation. Laxative assistance 15. Hyponatremia  Sodium 135 4/22--recheck Monday         LOS (Days) [place] EVALUATION WAS PERFORMED  Meredith Staggers, MD 05/12/2017 11:18 AM

## 2017-05-12 NOTE — Progress Notes (Signed)
Speech Language Pathology Daily Session Note  Patient Details  Name: Glen [name] MRN: [id number] Date of Birth: 05/03/1970  Today's Date: 05/12/2017 SLP Individual Time: 1130-1200 SLP Individual Time Calculation (min): 30 min  Short Term Goals: Week 3: SLP Short Term Goal 1 (Week 3): Pt will consume dys. 1 textures and thin liquids with Min A verbal cues for use of swallowing precautions and minimal overt s/s of aspiration over 3 consecutive sessions.  SLP Short Term Goal 2 (Week 3): Pt will consume trials of dys. 2 textures without overt s/s of aspiration and with efficient mastication and minimal oral residue with Mod A verbal cues for use of compensatory swallowing strategies over 2 sessions prior to upgrade.  SLP Short Term Goal 3 (Week 3): Pt will complete basic, familiar problem solving tasks with Min A multimodal cues.   SLP Short Term Goal 4 (Week 3): Pt will locate items to the left of midline in 75% of opportunities during basic, familiar tasks with Min A multimodal cues.   SLP Short Term Goal 5 (Week 3): Pt will demonstrate selective attention in a mildly distracting environment for ~45 minutes with Min A verbal cues for redirection.   SLP Short Term Goal 6 (Week 3): Pt will verbalize at the word level to convey needs and wants to caregivers with Min A multimodal cues.    Skilled Therapeutic Interventions: Skilled treatment session focused on cognition and dysphagia goals. SLP facilitated session by providing Min A to demonstrate selective attention in mildly distracting environment and to locate items on his left during self-feeding snack. SLP also facilitated session by providing skilled observation of pt consuming peanut butter crackers. Pt with good oral clearing despite decreased lingual manipulation of bolus however with extended oral prep time, crackers appeared to melt instead of being manipulated. Will attempt full dysphagia 2 lunch tray at next available session. Pt was  returned to nursing station for increased supervision.      Function:  Eating Eating Eating activity did not occur: N/A Modified Consistency Diet: No Eating Assist Level: Set up assist for;Supervision or verbal cues;More than reasonable amount of time   Eating Set Up Assist For: Opening containers       Cognition Comprehension Comprehension assist level: Understands basic 50 - 74% of the time/ requires cueing 25 - 49% of the time  Expression   Expression assist level: Expresses basic 25 - 49% of the time/requires cueing 50 - 75% of the time. Uses single words/gestures.  Social Interaction Social Interaction assist level: Interacts appropriately 50 - 74% of the time - May be physically or verbally inappropriate.  Problem Solving Problem solving assist level: Solves basic 25 - 49% of the time - needs direction more than half the time to initiate, plan or complete simple activities  Memory Memory assist level: Recognizes or recalls 25 - 49% of the time/requires cueing 50 - 75% of the time    Pain Pain Assessment Pain Scale: Faces Faces Pain Scale: No hurt  Therapy/Group: Individual Therapy  Keyonia Gluth 05/12/2017, 4:43 PM

## 2017-05-12 NOTE — Progress Notes (Signed)
Social Work Patient ID: [name], male   DOB: 12/25/1970, 47 y.o.   MRN: [id number]   CSW spoke with pt's mother to update her on pt's condition and his need for min A at home.  CSW expressed concern if mother could do this level of care with her physical restrictions from heart procedure.  She explained that the bigger family has already discussed this and her other children have recognized that pt's mother cannot do it alone, so they will help to be with pt.  CSW offered to assist if pt needed to go for [facility] SN[facility] for more rehab to get to supervision level, but she feels family can handle it.  CSW asked for mother to find a time for family to come in for education.  It might be best on the weekend, due to various family members' schedules.    CSW also called [facility] agencies in [place] and found that pt had [facility] in the past.  Mother would like to stick with this agency.  CSW to set this up for pt, as well as ordering any DME needed.  Pt has a rolling walker from 2016, so he is not eligible to receive a wide rolling walker, although this is recommended by PT so that pt doesn't kick the walker while ambulating.  It will cost $131.30 out of pocket, so CSW will see if family wants to purchase this or use what pt already has.  CSW will also schedule PCP post hospital f/u visit and request PCS for pt for aide through his Medicaid.  CSW will continue to follow and assist as needed.

## 2017-05-12 NOTE — Progress Notes (Signed)
Occupational Therapy Session Note  Patient Details  Name: Glen [name] MRN: [id number] Date of Birth: [birth year]  Today's Date: 05/12/2017 OT Individual Time: 8841-6606 OT Individual Time Calculation (min): 70 min   Short Term Goals: Week 3:  OT Short Term Goal 1 (Week 3): STGs=LTGs secondary to upcoming discharge  Skilled Therapeutic Interventions/Progress Updates:    Pt greeted supine in bed. Asleep and easily woken. Agreeable to shower. Tx focus on initiation, motor planning, balance, and functional transfers during bathing, dressing, toileting, and grooming tasks. Pt completed all bathroom transfers with Min A using RW at ambulatory level. Mod vcs for self setup of shower including temperature controls. Lateral leans for pericare. He then dressed while seated on toilet. Mod tactile/verbal cues for proper clothing orientation. Had him identify each article of clothing out loud. When asked what his pants were, pt responded "Iona Beard." Then, laughing: "No. Not Iona Beard- pants!" Pt with continent void of bladder while seated on toilet also. Min A sit<stands. Afterwards he completed oral care/grooming tasks while standing at sink. Took him several minutes to recap toothpaste due to apraxia and UE ataxia. Also extra time required for meeting visual scanning demands. Throughout session, pt tended to repeat the same incorrect problem solving strategies for several minutes, and therefore required frequent instruction. At end of session pt was setup for breakfast and left at RN station with NT to provide supervision. Chair alarm activated and safety belt fastened.        Therapy Documentation Precautions:  Precautions Precautions: Fall Restrictions Weight Bearing Restrictions: No Pain: No s/s pain during session  Pain Assessment Pain Scale: 0-10 Pain Score: 0-No pain ADL:      See Function Navigator for Current Functional Status.   Therapy/Group: Individual Therapy  Ria Redcay A  Donelle Hise 05/12/2017, 12:20 PM

## 2017-05-12 NOTE — Progress Notes (Signed)
Occupational Therapy Session Note  Patient Details  Name: Glen [name] MRN: [id number] Date of Birth: Oct 16, 1970  Today's Date: 05/12/2017 OT Individual Time: 1015-1100 OT Individual Time Calculation (min): 45 min    Short Term Goals: Week 3:  OT Short Term Goal 1 (Week 3): STGs=LTGs secondary to upcoming discharge  Skilled Therapeutic Interventions/Progress Updates:    Pt received sitting up in w/c at nursing station with no c/o pain. Pt completed standing level oral care at sink with CGA provided during transfer and static standing. Vc and gestures provided to encourage functional reaching for items at sink. Pt then ambulated to bed with RW and min A and completed IADL task, making bed from standing, with min/mod A provided for standing balance during reaching outside of BOS and vc/gestures/manual facilitation for correction of overreaching and LOB. Pt then completed 275ft of w/c mobility using B LE to propel. Engaged pt in problem solving through changing pillowcase. Pt completed 73ft of functional mobility into bathroom and transferred to toilet with min A. Pt completed standing level clothing management before/after toileting with CGA. Pt left in room with friend present, QRB donned, chair alarm set, and nursing notified of pt's location.   Therapy Documentation Precautions:  Precautions Precautions: Fall Restrictions Weight Bearing Restrictions: No General: General PT Missed Treatment Reason: Other (Comment)(lunch) Vital Signs: Therapy Vitals Temp: (!) 97.5 F (36.4 C) Temp Source: Oral Pulse Rate: 82 Resp: 18 BP: 112/84 Patient Position (if appropriate): Lying Oxygen Therapy SpO2: 100 % O2 Device: Room Air Pain: Pain Assessment Pain Scale: Faces Faces Pain Scale: No hurt  See Function Navigator for Current Functional Status.   Therapy/Group: Individual Therapy  Curtis Sites 05/12/2017, 3:38 PM

## 2017-05-12 NOTE — Plan of Care (Signed)
  Problem: RH BOWEL ELIMINATION Goal: RH STG MANAGE BOWEL WITH ASSISTANCE Description STG Manage Bowel with mod Assistance.  Outcome: Progressing   Problem: RH BLADDER ELIMINATION Goal: RH STG MANAGE BLADDER WITH ASSISTANCE Description STG Manage Bladder With mod Assistance  Outcome: Progressing   Problem: RH SAFETY Goal: RH STG ADHERE TO SAFETY PRECAUTIONS W/ASSISTANCE/DEVICE Description STG Adhere to Safety Precautions With mod  Assistance/Device.  Outcome: Progressing Goal: RH STG DECREASED RISK OF FALL WITH ASSISTANCE Description STG Decreased Risk of Fall With mod Assistance.  Outcome: Progressing   Problem: RH COGNITION-NURSING Goal: RH STG USES MEMORY AIDS/STRATEGIES W/ASSIST TO PROBLEM SOLVE Description STG Uses Memory Aids/Strategies With mod Assistance to Problem Solve.  Outcome: Progressing

## 2017-05-12 NOTE — Progress Notes (Signed)
Physical Therapy Note  Patient Details  Name: Glen [name] MRN: [id number] Date of Birth: 18-Nov-1970 Today's Date: 05/12/2017    Pt missed 45 minutes skilled PT due to still eating lunch.     Kadin Canipe 05/12/2017, 3:17 PM

## 2017-05-13 ENCOUNTER — Inpatient Hospital Stay (HOSPITAL_COMMUNITY): Payer: Medicare Other | Admitting: *Deleted

## 2017-05-13 ENCOUNTER — Encounter (HOSPITAL_COMMUNITY): Payer: Medicare Other | Admitting: Occupational Therapy

## 2017-05-13 NOTE — Plan of Care (Signed)
  Problem: RH SAFETY Goal: RH STG ADHERE TO SAFETY PRECAUTIONS W/ASSISTANCE/DEVICE Description STG Adhere to Safety Precautions With mod  Assistance/Device. Patient impulsive; high fall risk; enclosure bed Outcome: Not Progressing

## 2017-05-13 NOTE — Progress Notes (Signed)
PHYSICAL MEDICINE & REHABILITATION     PROGRESS NOTE    Subjective/Complaints: Patient with reasonable night.  And comfortably in bed this morning.  ROS: Limited due to cognitive/behavioral    Objective: Vital Signs: Blood pressure 114/72, pulse 83, temperature 97.6 F (36.4 C), temperature source Oral, resp. rate 18, weight 83.3 kg (183 lb 9.6 oz), SpO2 100 %. No results found. No results for input(s): WBC, HGB, HCT, PLT in the last 72 hours. No results for input(s): NA, K, CL, GLUCOSE, BUN, CREATININE, CALCIUM in the last 72 hours.  Invalid input(s): CO CBG (last 3)  No results for input(s): GLUCAP in the last 72 hours.  Wt Readings from Last 3 Encounters:  05/13/17 83.3 kg (183 lb 9.6 oz)  04/24/17 86.1 kg (189 lb 13.1 oz)  02/13/17 91.2 kg (201 lb)    Physical Exam:    Constitutional: No distress . Vital signs reviewed. HEENT: EOMI, oral membranes moist Neck: supple Cardiovascular: RRR without murmur. No JVD    Respiratory: CTA Bilaterally without wheezes or rales. Normal effort    GI: BS +, non-tender, non-distended   Neurological. Alert, follows simple commands. Limited insight HOH.  Motor: strengh 4/5. Skin. Craniotomy sites are clean and dry, cranial deformities present Psych: engages but remains distracted--no change  Assessment/Plan: 1. Functional and cognitive deficits secondary to SDH which require 3+ hours per day of interdisciplinary therapy in a comprehensive inpatient rehab setting. Physiatrist is providing close team supervision and 24 hour management of active medical problems listed below. Physiatrist and rehab team continue to assess barriers to discharge/monitor patient progress toward functional and medical goals.  Function:  Bathing Bathing position Bathing activity did not occur: N/A Position: Shower  Bathing parts Body parts bathed by patient: Chest, Abdomen, Front perineal area, Right arm, Left arm, Right upper leg, Left  upper leg, Buttocks, Right lower leg, Left lower leg, Back Body parts bathed by helper: Back  Bathing assist Assist Level: Supervision or verbal cues      Upper Body Dressing/Undressing Upper body dressing Upper body dressing/undressing activity did not occur: N/A What is the patient wearing?: Pull over shirt/dress     Pull over shirt/dress - Perfomed by patient: Put head through opening, Pull shirt over trunk, Thread/unthread right sleeve, Thread/unthread left sleeve Pull over shirt/dress - Perfomed by helper: Thread/unthread left sleeve, Pull shirt over trunk        Upper body assist Assist Level: Supervision or verbal cues   Set up : To obtain clothing/put away  Lower Body Dressing/Undressing Lower body dressing Lower body dressing/undressing activity did not occur: N/A What is the patient wearing?: Pants, Non-skid slipper socks, Shoes     Pants- Performed by patient: Thread/unthread left pants leg, Pull pants up/down, Thread/unthread right pants leg Pants- Performed by helper: Thread/unthread right pants leg Non-skid slipper socks- Performed by patient: Don/doff right sock, Don/doff left sock Non-skid slipper socks- Performed by helper: Don/doff left sock Socks - Performed by patient: Don/doff right sock, Don/doff left sock(no cues!)   Shoes - Performed by patient: Don/doff right shoe, Don/doff left shoe Shoes - Performed by helper: Fasten right, Fasten left          Lower body assist Assist for lower body dressing: Supervision or verbal cues, Touching or steadying assistance (Pt > 75%)      Toileting Toileting Toileting activity did not occur: No continent bowel/bladder event Toileting steps completed by patient: Adjust clothing prior to toileting, Adjust clothing after toileting Toileting steps completed  by helper: Performs perineal hygiene Toileting Assistive Devices: Grab bar or rail  Toileting assist Assist level: Touching or steadying assistance (Pt.75%)    Transfers Chair/bed transfer   Chair/bed transfer method: Ambulatory Chair/bed transfer assist level: Touching or steadying assistance (Pt > 75%) Chair/bed transfer assistive device: Armrests, Medical sales representative     Max distance: 150 Assist level: Touching or steadying assistance (Pt > 75%)   Wheelchair     Max wheelchair distance: 200' Assist Level: Supervision or verbal cues  Cognition Comprehension Comprehension assist level: Understands basic 50 - 74% of the time/ requires cueing 25 - 49% of the time  Expression Expression assist level: Expresses basic 25 - 49% of the time/requires cueing 50 - 75% of the time. Uses single words/gestures.  Social Interaction Social Interaction assist level: Interacts appropriately 50 - 74% of the time - May be physically or verbally inappropriate.  Problem Solving Problem solving assist level: Solves basic 25 - 49% of the time - needs direction more than half the time to initiate, plan or complete simple activities  Memory Memory assist level: Recognizes or recalls 25 - 49% of the time/requires cueing 50 - 75% of the time   Medical Problem List and Plan: 1.Decreased functional mobilitysecondary to right subdural hematoma after fall complicated by acute hypoxic respiratory failure and stridor as well as history of glioblastoma and seizure disorder -CIR PT, OT, SLP ---ELOS 4/30--family ed/diso planning 2. DVT Prophylaxis/Anticoagulation/history of heparin-induced thrombocytopenia: SCDs. Arixtra currently on hold due to hematoma 3. Pain Management:Tylenol as needed 4. Mood:Prozac 20 mg daily 5. Neuropsych: This patientisnot fully capable of making decisions on hisown behalf.   -continue enclosure bed for patient safety 6. Skin/Wound Care:Routine skin checks 7. Fluids/Electrolytes/Nutrition:encourage PO    -good po intake in general     8.Seizure disorder. Vimpat 200 mg twice daily, Keppra 1500 mg twice  daily. Follow-up neurology services Dr. Krista Blue as outpatient No recurrent seizures noted 9.Dysphagia. remains on Dysphagia #1 thin liquids.   -may be an opportunity to upgrade diet-does not appear to be having any difficulties with tolerance of current diet 10.Hypothyroidism. Synthroid 11.Hyperlipidemia. Lipitor  13.MRSA PCR screening positive. Maintain contact precautions 14.Constipation. Laxative assistance 15. Hyponatremia  Sodium 135 4/22--recheck Monday         LOS (Days) [place] EVALUATION WAS PERFORMED  Meredith Staggers, MD 05/13/2017 9:26 AM

## 2017-05-13 NOTE — Progress Notes (Signed)
Physical Therapy Session Note  Patient Details  Name: Glen [name] MRN: [id number] Date of Birth: 06-19-1970  Today's Date: 05/13/2017 PT Individual Time: 1000-1100 PT Individual Time Calculation (min): 60 min   Short Term Goals: Week 3:  PT Short Term Goal 1 (Week 3): = LTG  Skilled Therapeutic Interventions/Progress Updates:  Tx focused on functional mobility training, gait with RW, and NMR via forced use, manual facilitation, and multi-modal cues. Pt up at RN station. Pocket talker used with good results during tx.  Pt performed dynamic sitting during familiar functional tasks, including donning shoes with mod cues for safety and R/L.  Sit<>stand with Min A overall, Mod A at times due to impulsivity.  Gait training 2x150' with RW and up to Mod A for ataxia and decreased motor control with any amount of distraction.    Seated reaching/placing card matching game at eye level. Pt had more difficulty with this task today, only getting 25% correct and increased time for processing needed.  Standing balance task at // bars for reaching across midline to reach/place cups. Pt had difficulty motor planning and following instructions with this task  as it was likely a new task. Min A needed with 1/2 UE for support.  Seated HS stretch x16mil bil with available range and form within his ability.  Family arrive for last 10 min of tx, and were instructed in seated HS stretch as well as pocket talker. Gait belt issued. We did not have time to do hands-on training for transfers or gait.  Pt left up in [facility] with lap belt and family present.    Therapy Documentation Precautions:  Precautions Precautions: Fall Restrictions Weight Bearing Restrictions: No Pain: None    See Function Navigator for Current Functional Status.   Therapy/Group: Individual Therapy  Kennieth Rad, PT, DPT  Kennieth Rad M 05/13/2017, 12:38 PM

## 2017-05-13 NOTE — Progress Notes (Signed)
Occupational Therapy Session Note  Patient Details  Name: Glen [name] MRN: [id number] Date of Birth: October 16, 1970  Today's Date: 05/13/2017 OT Individual Time: 1105-1205 OT Individual Time Calculation (min): 60 min    Skilled Therapeutic Interventions/Progress Updates: Skilled Therapeutic Interventions/Progress Updates: Mom, blind dad ,and his sister were present this session.    Sister assisted with providing cognititive recognition, orientation, focus and intiation tasks (via the games and cards familiar to him that his mother brought in) while this clinician cues her on how to cue him and while this clinician assisted the patient working on static standing balance while he worked on verbal expression and cognitive processing.    When initially given the verbal command to stand, patient presented with ataxic movements and clonus type movements in his feet.  After being allowed time to process the activity and given strong tactile cue to legs/feet, he was able to stand with Min A for about 30 minutes before fatiguing.     He finished the session seated in his w/c passing the beach ball back and to his sister.   He giggled and smile while completing this particular activity.  Patient was left seated and safely securely in w/c passing the ball with his sister at the end of the session.   Patient's mother and father sat nearby and watched.       Therapy Documentation Precautions:  Precautions Precautions: Fall Restrictions Weight Bearing Restrictions: No  See Function Navigator for Current Functional Status.   Therapy/Group: Individual Therapy  Alfredia Ferguson [facility] 05/13/2017, 4:25 PM

## 2017-05-14 NOTE — Progress Notes (Signed)
[facility] PHYSICAL MEDICINE & REHABILITATION     PROGRESS NOTE    Subjective/Complaints: No new problems overnight.  Patient slept fairly well.  ROS: Patient denies fever, rash, sore throat, blurred vision, nausea, vomiting, diarrhea, cough, shortness of breath or chest pain, joint or back pain, headache, or mood change.    Objective: Vital Signs: Blood pressure 134/75, pulse (!) 58, temperature 98.4 F (36.9 C), temperature source Oral, resp. rate 18, height 5\' 10"  (1.778 m), weight 83.3 kg (183 lb 9.6 oz), SpO2 100 %. No results found. No results for input(s): WBC, HGB, HCT, PLT in the last 72 hours. No results for input(s): NA, K, CL, GLUCOSE, BUN, CREATININE, CALCIUM in the last 72 hours.  Invalid input(s): CO CBG (last 3)  No results for input(s): GLUCAP in the last 72 hours.  Wt Readings from Last 3 Encounters:  05/13/17 83.3 kg (183 lb 9.6 oz)  04/24/17 86.1 kg (189 lb 13.1 oz)  02/13/17 91.2 kg (201 lb)    Physical Exam:    Constitutional: No distress . Vital signs reviewed. HEENT: EOMI, oral membranes moist Neck: supple Cardiovascular: RRR without murmur. No JVD    Respiratory: CTA Bilaterally without wheezes or rales. Normal effort    GI: BS +, non-tender, non-distended  Neurological. Alert, follows simple commands. Limited insight HOH.  Motor: strengh 4/5. Skin. Craniotomy sites are clean and dry, cranial deformities present Psych: engages but remains distracted--consistent  Assessment/Plan: 1. Functional and cognitive deficits secondary to SDH which require 3+ hours per day of interdisciplinary therapy in a comprehensive inpatient rehab setting. Physiatrist is providing close team supervision and 24 hour management of active medical problems listed below. Physiatrist and rehab team continue to assess barriers to discharge/monitor patient progress toward functional and medical goals.  Function:  Bathing Bathing position Bathing activity did not occur:  N/A Position: Shower  Bathing parts Body parts bathed by patient: Chest, Abdomen, Front perineal area, Right arm, Left arm, Right upper leg, Left upper leg, Buttocks, Right lower leg, Left lower leg, Back Body parts bathed by helper: Back  Bathing assist Assist Level: Supervision or verbal cues      Upper Body Dressing/Undressing Upper body dressing Upper body dressing/undressing activity did not occur: N/A What is the patient wearing?: Pull over shirt/dress     Pull over shirt/dress - Perfomed by patient: Put head through opening, Pull shirt over trunk, Thread/unthread right sleeve, Thread/unthread left sleeve Pull over shirt/dress - Perfomed by helper: Thread/unthread left sleeve, Pull shirt over trunk        Upper body assist Assist Level: Supervision or verbal cues   Set up : To obtain clothing/put away  Lower Body Dressing/Undressing Lower body dressing Lower body dressing/undressing activity did not occur: N/A What is the patient wearing?: Pants, Non-skid slipper socks, Shoes     Pants- Performed by patient: Thread/unthread left pants leg, Pull pants up/down, Thread/unthread right pants leg Pants- Performed by helper: Thread/unthread right pants leg Non-skid slipper socks- Performed by patient: Don/doff right sock, Don/doff left sock Non-skid slipper socks- Performed by helper: Don/doff left sock Socks - Performed by patient: Don/doff right sock, Don/doff left sock(no cues!)   Shoes - Performed by patient: Don/doff right shoe, Don/doff left shoe Shoes - Performed by helper: Fasten right, Fasten left          Lower body assist Assist for lower body dressing: Supervision or verbal cues, Touching or steadying assistance (Pt > 75%)      Toileting Toileting Toileting activity  did not occur: No continent bowel/bladder event Toileting steps completed by patient: Adjust clothing prior to toileting, Adjust clothing after toileting Toileting steps completed by helper: Performs  perineal hygiene Toileting Assistive Devices: Grab bar or rail  Toileting assist Assist level: Touching or steadying assistance (Pt.75%)   Transfers Chair/bed transfer   Chair/bed transfer method: Ambulatory Chair/bed transfer assist level: Moderate assist (Pt 50 - 74%/lift or lower) Chair/bed transfer assistive device: Armrests, Medical sales representative     Max distance: 150 Assist level: Touching or steadying assistance (Pt > 75%)   Wheelchair     Max wheelchair distance: 200' Assist Level: Supervision or verbal cues  Cognition Comprehension Comprehension assist level: Understands basic 50 - 74% of the time/ requires cueing 25 - 49% of the time  Expression Expression assist level: Expresses basic 25 - 49% of the time/requires cueing 50 - 75% of the time. Uses single words/gestures.  Social Interaction Social Interaction assist level: Interacts appropriately 50 - 74% of the time - May be physically or verbally inappropriate.  Problem Solving Problem solving assist level: Solves basic 25 - 49% of the time - needs direction more than half the time to initiate, plan or complete simple activities  Memory Memory assist level: Recognizes or recalls 25 - 49% of the time/requires cueing 50 - 75% of the time   Medical Problem List and Plan: 1.Decreased functional mobilitysecondary to right subdural hematoma after fall complicated by acute hypoxic respiratory failure and stridor as well as history of glioblastoma and seizure disorder -CIR PT, OT, SLP ---ELOS 4/30--family ed/diso planning 2. DVT Prophylaxis/Anticoagulation/history of heparin-induced thrombocytopenia: SCDs. Arixtra currently on hold due to hematoma 3. Pain Management:Tylenol as needed 4. Mood:Prozac 20 mg daily 5. Neuropsych: This patientisnot fully capable of making decisions on hisown behalf.   -Renewed enclosure bed for patient safety 6. Skin/Wound Care:Routine skin checks 7.  Fluids/Electrolytes/Nutrition:encourage PO    -good po intake in general     8.Seizure disorder. Vimpat 200 mg twice daily, Keppra 1500 mg twice daily. Follow-up neurology services Dr. Krista Blue as outpatient No recurrent seizures noted 9.Dysphagia. remains on Dysphagia #1 thin liquids.   -may be an opportunity to upgrade diet-does not appear to be having any difficulties with tolerance of current diet 10.Hypothyroidism. Synthroid 11.Hyperlipidemia. Lipitor  13.MRSA PCR screening positive. Maintain contact precautions 14.Constipation. Laxative assistance 15. Hyponatremia  Sodium 135 4/22--recheck Monday         LOS (Days) [place] EVALUATION WAS PERFORMED  Meredith Staggers, MD 05/14/2017 10:10 AM

## 2017-05-14 NOTE — Plan of Care (Signed)
  Problem: RH BLADDER ELIMINATION Goal: RH STG MANAGE BLADDER WITH ASSISTANCE Description STG Manage Bladder With mod Assistance  Outcome: Not Progressing;  patient incontinent at times; needs time toileting

## 2017-05-15 ENCOUNTER — Ambulatory Visit (HOSPITAL_COMMUNITY): Payer: Medicare Other | Admitting: Speech Pathology

## 2017-05-15 ENCOUNTER — Inpatient Hospital Stay (HOSPITAL_COMMUNITY): Payer: Medicare Other | Admitting: Occupational Therapy

## 2017-05-15 ENCOUNTER — Inpatient Hospital Stay (HOSPITAL_COMMUNITY): Payer: Medicare Other | Admitting: Physical Therapy

## 2017-05-15 ENCOUNTER — Ambulatory Visit: Payer: Medicare Other | Admitting: Neurology

## 2017-05-15 ENCOUNTER — Inpatient Hospital Stay (HOSPITAL_COMMUNITY): Payer: Medicare Other | Admitting: Speech Pathology

## 2017-05-15 LAB — BASIC METABOLIC PANEL
Anion gap: 6 (ref 5–15)
BUN: 9 mg/dL (ref 6–20)
CHLORIDE: 100 mmol/L — AB (ref 101–111)
CO2: 32 mmol/L (ref 22–32)
Calcium: 9.7 mg/dL (ref 8.9–10.3)
Creatinine, Ser: 0.87 mg/dL (ref 0.61–1.24)
GFR calc non Af Amer: >60 mL/min (ref >60)
Glucose, Bld: 99 mg/dL (ref 65–99)
POTASSIUM: 4.2 mmol/L (ref 3.5–5.1)
SODIUM: 138 mmol/L (ref 135–145)

## 2017-05-15 NOTE — Progress Notes (Signed)
Occupational Therapy Discharge Summary  Patient Details  Name: Glen [name] MRN: [id number] Date of Birth: 1970/06/29  Today's Date: 05/15/2017 OT Individual Time: 8786-7672 OT Individual Time Calculation (min): 72 min   Patient has met 11 of 11 long term goals due to improved activity tolerance, improved balance, postural control, ability to compensate for deficits, improved attention, improved awareness and improved coordination.  Patient to discharge at [facility] Assist level.  Patient's family have attended family education session to provide the necessary physical and cognitive assistance at discharge.    All goals met.   Recommendation:  Patient will benefit from ongoing skilled OT services in home health setting to continue to advance functional skills in the area of BADL.  Equipment: TTB  Reasons for discharge: treatment goals met and discharge from hospital  Patient/family agrees with progress made and goals achieved: Yes   Skilled Therapeutic Intervention:  Pt greeted asleep in bed, easily woken. Used pocket talker for increasing ease of communication during session. Tx focus on balance, functional transfers, activity tolerance, cognition, and coordination during toileting, bathing, dressing, and grooming tasks. All functional bathroom transfers completed with RW and Min Glen at ambulatory level. Min Glen LB dressing at sit<stand level. 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 Grooming/oral care completed with increased time due to ataxic UE movements, however pt able to meet FM demands with instruction. At end of session pt was escorted to RN station and set up for breakfast. Left him with chair alarm set and safety belt fastened.   OT Discharge Precautions/Restrictions  Precautions Precautions: Fall Restrictions Weight Bearing Restrictions: No Vital Signs Therapy Vitals Temp: 98.6 F (37 C) Temp Source: Oral Pulse Rate: 98 Resp: 18 BP:  92/75 Patient Position (if appropriate): Sitting Oxygen Therapy SpO2: 95 % O2 Device: Room Air Pain Pain Assessment Pain Score: 0-No pain Faces Pain Scale: No hurt ADL ADL ADL Comments: Please see functional navigator for ADL status Vision Baseline Vision/History: Wears glasses Wears Glasses: At all times Patient Visual Report: No change from baseline Vision Assessment?: No apparent visual deficits Praxis Praxis: Impaired Praxis Impairment Details: Motor planning;Ideomotor Cognition Overall Cognitive Status: History of cognitive impairments - at baseline Arousal/Alertness: Awake/alert Orientation Level: Oriented to situation;Oriented to person;Oriented to place;Disoriented to time Attention: Sustained Focused Attention: Appears intact Sustained Attention: Impaired Memory: Impaired Awareness: Impaired Awareness Impairment: Intellectual impairment Problem Solving: Impaired Initiating: Impaired Safety/Judgment: Impaired Sensation Sensation Light Touch: (Difficult to assess secondary to cognition) Stereognosis: Not tested Hot/Cold: Appears Intact Proprioception: Impaired by gross assessment Coordination Gross Motor Movements are Fluid and Coordinated: No Fine Motor Movements are Fluid and Coordinated: No Coordination and Movement Description: Ataxic + apraxic Motor  Motor Motor: Motor apraxia;Abnormal postural alignment and control Mobility  Transfers Transfers: Sit to Stand;Stand to Sit Sit to Stand: From toilet Sit to Stand Details: Verbal cues for precautions/safety;Verbal cues for safe use of DME/AE Stand to Sit: 4: Min assist;To toilet Stand to Sit Details (indicate cue type and reason): Verbal cues for precautions/safety;Verbal cues for safe use of DME/AE  Trunk/Postural Assessment  Cervical Assessment Cervical Assessment: Exceptions to WFL(forward head) Thoracic Assessment Thoracic Assessment: Within Functional Limits Lumbar Assessment Lumbar Assessment:  Exceptions to WFL(posterior pelvic tilt) Postural Control Postural Control: Deficits on evaluation  Balance Balance Balance Assessed: Yes Dynamic Sitting Balance Dynamic Sitting - Level of Assistance: 5: Stand by assistance Sitting balance - Comments: donning footwear edge of tub bench Dynamic Standing Balance Dynamic Standing - Level of Assistance: 4: Min  assist Dynamic Standing - Comments: Toilet transfers using RW Extremity/Trunk Assessment RUE Assessment RUE Assessment: Exceptions to WFL(ataxic and apraxic functional movements) LUE Assessment LUE Assessment: Exceptions to WFL(ataxic + apraxic functional movements)   See Function Navigator for Current Functional Status.  Glen [name] Glen [name] 05/15/2017, 4:42 PM

## 2017-05-15 NOTE — Discharge Summary (Signed)
Discharge summary job [id number]

## 2017-05-15 NOTE — Progress Notes (Signed)
Speech Language Pathology Discharge Summary  Patient Details  Name: Glen [name] MRN: [id number] Date of Birth: 1970-09-10  Today's Date: 05/15/2017 SLP Individual Time: 7092-9574 SLP Individual Time Calculation (min): 30 min   SLP Individual time: 1300-1315 SLP Individual Time Calculation (min): 15 min   Skilled Therapeutic Interventions:  Skilled treatment session focused on dysphagia goals. SLP facilitated session by providing Mod A cues for complete oral clearing with trials of graham crackers in pudding. Pt with decreased lingual manipulation and increased buccal residue. Mod A cues needed for liquid wash.    Skilled treatment session #2 - focused on dysphagia goals and completion of caregiver education with pt's family. SLP facilitated session by providing skilled observation of pt consuming trial tray of dysphagia 2 with thin liquids via straw. Pt with significant increased oral prep time d/t decreased lingual manipulation of bolus, perseverative chewing of Dysphagia 2 bolus, pocketing on left and Max A cues for liquid wash and despite liquid wash pt unable to clear bolus. Pt with residue for ~ 15 minutes after meal that he was perseverative chewing. Education provided to pt's family on putting all food in blender. All voiced understanding. Education completed.     Patient has met 7 of 7 long term goals.  Patient to discharge at overall Min;Mod level.    Clinical Impression/Discharge Summary:   Pt has made minimal progress in skilled ST session and as a result he is considered baseline with cognitive function. He is at increased choking risk with trials of dysphagia 2 and would benefit from continued puree.   Care Partner:  Caregiver Able to Provide Assistance: Yes  Type of Caregiver Assistance: Physical;Cognitive  Recommendation:  Home Health SLP;24 hour supervision/assistance  Rationale for SLP Follow Up: Maximize functional communication;Maximize cognitive function and  independence;Maximize swallowing safety;Reduce caregiver burden   Equipment:     Reasons for discharge: Discharged from hospital;Treatment goals met   Patient/Family Agrees with Progress Made and Goals Achieved: Yes   Function:  Eating Eating   Modified Consistency Diet: Yes Eating Assist Level: Set up assist for;Supervision or verbal cues;More than reasonable amount of time   Eating Set Up Assist For: Opening containers       Cognition Comprehension Comprehension assist level: Understands basic 50 - 74% of the time/ requires cueing 25 - 49% of the time  Expression   Expression assist level: Expresses basic 25 - 49% of the time/requires cueing 50 - 75% of the time. Uses single words/gestures.  Social Interaction Social Interaction assist level: Interacts appropriately 50 - 74% of the time - May be physically or verbally inappropriate.  Problem Solving Problem solving assist level: Solves basic 25 - 49% of the time - needs direction more than half the time to initiate, plan or complete simple activities  Memory Memory assist level: Recognizes or recalls 25 - 49% of the time/requires cueing 50 - 75% of the time   [name] 05/15/2017, 1:45 PM

## 2017-05-15 NOTE — Progress Notes (Signed)
Physical Therapy Note  Patient Details  Name: YORK VALLIANT MRN: [id number] Date of Birth: April 10, 1970 Today's Date: 05/15/2017    Time: 1315-1400 45 minutes  1:1 No c/o pain.  Session focused on pt/family education with pt's brother, mother and father.  Pt's brother performed hands on training for gait with RW in home and controlled environments including uneven surfaces, shower seat transfer to walk in shower and floor transfer.  Pt/family educated on energy conservation, safety and d/c recommendations, all express understanding.  Pt performs all mobility at min A level with mod cuing for safety, sequencing and coordination.  Pt's family states they have w/c for out of the house mobility and that pt will have 24/7 supervision and assistance from family.  Pt left with family present, quick release belt and chair alarm set.   Evania Lyne 05/15/2017, 1:59 PM

## 2017-05-15 NOTE — Plan of Care (Signed)
11/11 LTGs achieved 05/15/2017

## 2017-05-15 NOTE — Progress Notes (Signed)
[facility]ICAL [facility]IC[facility] REHABILITATION     PROGRESS NOTE    Subjective/Complaints: Pt up in bed. No new issues this morning  ROS: Patient denies fever, rash, sore throat, blurred vision, nausea, vomiting, diarrhea, cough, shortness of breath or chest pain, joint or back pain, headache, or mood change.    Objective: Vital Signs: Blood pressure 125/90, pulse 70, temperature 99 F (37.2 C), temperature source Oral, resp. rate 16, height 5\' 10"  (1.778 m), weight 83.4 kg (183 lb 13.8 oz), SpO2 95 %. No results found. No results for input(s): WBC, HGB, HCT, PLT in the last 72 hours. Recent Labs    05/15/17 0603  NA 138  K 4.2  CL 100*  GLUCOSE 99  BUN 9  CREATININE 0.87  CALCIUM 9.7   CBG (last 3)  No results for input(s): GLUCAP in the last 72 hours.  Wt Readings from Last 3 Encounters:  05/15/17 83.4 kg (183 lb 13.8 oz)  04/24/17 86.1 kg (189 lb 13.1 oz)  02/13/17 91.2 kg (201 lb)    Physical Exam:    Constitutional: No distress . Vital signs reviewed. HEENT: EOMI, oral membranes moist Neck: supple Cardiovascular: RRR without murmur. No JVD    Respiratory: CTA Bilaterally without wheezes or rales. Normal effort    GI: BS +, non-tender, non-distended     GI: BS +, non-tender, non-distended  Neurological. Limited insight HOH.  Motor: strengh 4/5. Skin. Craniotomy sites are clean and dry, cranial deformities present Psych: engages but remains distracted--consistent  Assessment/Plan: 1. Functional and cognitive deficits secondary to SDH which require 3+ hours per day of interdisciplinary therapy in a comprehensive inpatient rehab setting. Physiatrist is providing close team supervision and 24 hour management of active medical problems listed below. Physiatrist and rehab team continue to assess barriers to discharge/monitor patient progress toward functional and medical goals.  Function:  Bathing Bathing position Bathing activity did not occur:  N/A Position: Shower  Bathing parts Body parts bathed by patient: Chest, Abdomen, Front perineal area, Right arm, Left arm, Right upper leg, Left upper leg, Buttocks, Right lower leg, Left lower leg, Back Body parts bathed by helper: Back  Bathing assist Assist Level: Supervision or verbal cues      Upper Body Dressing/Undressing Upper body dressing Upper body dressing/undressing activity did not occur: N/A What is the patient wearing?: Pull over shirt/dress     Pull over shirt/dress - Perfomed by patient: Put head through opening, Pull shirt over trunk, Thread/unthread right sleeve, Thread/unthread left sleeve Pull over shirt/dress - Perfomed by helper: Thread/unthread left sleeve, Pull shirt over trunk        Upper body assist Assist Level: Supervision or verbal cues   Set up : To obtain clothing/put away  Lower Body Dressing/Undressing Lower body dressing Lower body dressing/undressing activity did not occur: N/A What is the patient wearing?: Pants, Non-skid slipper socks, Shoes     Pants- Performed by patient: Thread/unthread left pants leg, Pull pants up/down, Thread/unthread right pants leg Pants- Performed by helper: Thread/unthread right pants leg Non-skid slipper socks- Performed by patient: Don/doff right sock, Don/doff left sock Non-skid slipper socks- Performed by helper: Don/doff left sock Socks - Performed by patient: Don/doff right sock, Don/doff left sock(no cues!)   Shoes - Performed by patient: Don/doff right shoe, Don/doff left shoe Shoes - Performed by helper: Fasten right, Fasten left          Lower body assist Assist for lower body dressing: Supervision or verbal cues, Touching or  steadying assistance (Pt > 75%)      Toileting Toileting Toileting activity did not occur: No continent bowel/bladder event Toileting steps completed by patient: Adjust clothing prior to toileting, Adjust clothing after toileting Toileting steps completed by helper: Performs  perineal hygiene Toileting Assistive Devices: Grab bar or rail  Toileting assist Assist level: Touching or steadying assistance (Pt.75%)   Transfers Chair/bed transfer   Chair/bed transfer method: Ambulatory Chair/bed transfer assist level: Moderate assist (Pt 50 - 74%/lift or lower) Chair/bed transfer assistive device: Armrests, Medical sales representative     Max distance: 150 Assist level: Touching or steadying assistance (Pt > 75%)   Wheelchair     Max wheelchair distance: 200' Assist Level: Supervision or verbal cues  Cognition Comprehension Comprehension assist level: Understands basic 50 - 74% of the time/ requires cueing 25 - 49% of the time  Expression Expression assist level: Expresses basic 25 - 49% of the time/requires cueing 50 - 75% of the time. Uses single words/gestures.  Social Interaction Social Interaction assist level: Interacts appropriately 50 - 74% of the time - May be physically or verbally inappropriate.  Problem Solving Problem solving assist level: Solves basic 25 - 49% of the time - needs direction more than half the time to initiate, plan or complete simple activities  Memory Memory assist level: Recognizes or recalls 25 - 49% of the time/requires cueing 50 - 75% of the time   Medical Problem List and Plan: 1.Decreased functional mobilitysecondary to right subdural hematoma after fall complicated by acute hypoxic respiratory failure and stridor as well as history of glioblastoma and seizure disorder -CIR PT, OT, SLP ---ELOS 4/30--family ed/diso planning  2. DVT Prophylaxis/Anticoagulation/history of heparin-induced thrombocytopenia: SCDs. Arixtra currently on hold due to hematoma 3. Pain Management:Tylenol as needed 4. Mood:Prozac 20 mg daily 5. Neuropsych: This patientisnot fully capable of making decisions on hisown behalf.   -continue enclosure bed for patient safety 6. Skin/Wound Care:Routine skin checks 7.  Fluids/Electrolytes/Nutrition:encourage PO    -good po intake in general    -normal labs today upon review 8.Seizure disorder. Vimpat 200 mg twice daily, Keppra 1500 mg twice daily. Follow-up neurology services Dr. Krista Blue as outpatient No recurrent seizures noted 9.Dysphagia. remains on Dysphagia #1 thin liquids.   -may be an opportunity to upgrade diet-does not appear to be having any difficulties with tolerance of current diet 10.Hypothyroidism. Synthroid 11.Hyperlipidemia. Lipitor  13.MRSA PCR screening positive. Maintain contact precautions 14.Constipation. Laxative assistance 15. Hyponatremia  Sodium 138 t/29         LOS (Days) 21 A FACE TO FACE EVALUATION WAS PERFORMED  Meredith Staggers, MD 05/15/2017 8:57 AM

## 2017-05-16 MED ORDER — ATORVASTATIN CALCIUM 20 MG PO TABS: 20.0000 mg | ORAL_TABLET | Freq: Every day | ORAL | 2 refills | Status: DC

## 2017-05-16 MED ORDER — LEVETIRACETAM 100 MG/ML PO SOLN: 1500.0000 mg | Freq: Two times a day (BID) | ORAL | 12 refills | Status: DC

## 2017-05-16 MED ORDER — POLYETHYLENE GLYCOL 3350 17 G PO PACK: 17.0000 g | PACK | Freq: Every day | ORAL | 0 refills | Status: DC

## 2017-05-16 MED ORDER — FLUOXETINE HCL 20 MG PO CAPS: 20.0000 mg | ORAL_CAPSULE | Freq: Every day | ORAL | 2 refills | Status: DC

## 2017-05-16 MED ORDER — LEVOTHYROXINE SODIUM 50 MCG PO TABS: 50.0000 ug | ORAL_TABLET | Freq: Every day | ORAL | 3 refills | Status: DC

## 2017-05-16 MED ORDER — LACOSAMIDE 200 MG PO TABS: 200.0000 mg | ORAL_TABLET | Freq: Two times a day (BID) | ORAL | 3 refills | Status: DC

## 2017-05-16 NOTE — Progress Notes (Signed)
Physical Therapy Discharge Summary  Patient Details  Name: Glen [name] MRN: [id number] Date of Birth: 17-Jun-1970    Patient has met 7 of 9 long term goals due to improved activity tolerance, improved balance, improved postural control, increased strength, ability to compensate for deficits and improved attention.  Patient to discharge at an ambulatory level [place].   Patient's care partner is independent to provide the necessary physical and cognitive assistance at discharge.  Reasons goals not met: pt requires min A for all mobility, continues to require mod cuing for awareness  Recommendation:  Patient will benefit from ongoing skilled PT services in home health setting to continue to advance safe functional mobility, address ongoing impairments in balance, gait, strength, and minimize fall risk.  Equipment: wide RW  Reasons for discharge: treatment goals met and discharge from hospital  Patient/family agrees with progress made and goals achieved: Yes  PT Discharge Precautions/Restrictions Precautions Precautions: Fall Restrictions Weight Bearing Restrictions: No Pain Pain Assessment Faces Pain Scale: No hurt  Cognition Overall Cognitive Status: History of cognitive impairments - at baseline Sensation Sensation Light Touch: Appears Intact Proprioception: Impaired by gross assessment Coordination Gross Motor Movements are Fluid and Coordinated: No Fine Motor Movements are Fluid and Coordinated: No Coordination and Movement Description: Ataxic + apraxic Motor  Motor Motor: Motor apraxia;Abnormal postural alignment and control;Ataxia Motor - Discharge Observations: ataxia, apraxia   Trunk/Postural Assessment  Cervical Assessment Cervical Assessment: (fwd head) Thoracic Assessment Thoracic Assessment: Within Functional Limits Lumbar Assessment Lumbar Assessment: (posterior pelvic tilt) Postural Control Righting Reactions: delayed  Balance Dynamic Sitting  Balance Dynamic Sitting - Level of Assistance: 5: Stand by assistance Dynamic Standing Balance Dynamic Standing - Level of Assistance: 4: Min assist Extremity Assessment      RLE Assessment RLE Assessment: (grossly 3+/5) LLE Assessment LLE Assessment: (grossly 3+/5)   See Function Navigator for Current Functional Status.  Makayleigh Poliquin 05/16/2017, 8:13 AM

## 2017-05-16 NOTE — Discharge Instructions (Signed)
Inpatient Rehab Discharge Instructions  Glen [name] Discharge date and time: No discharge date for patient encounter.   Activities/Precautions/ Functional Status: Activity: activity as tolerated Diet: Dysphasia #1 diet Wound Care: keep wound clean and dry Functional status:  ___ No restrictions     ___ Walk up steps independently ___ 24/7 supervision/assistance   ___ Walk up steps with assistance ___ Intermittent supervision/assistance  ___ Bathe/dress independently ___ Walk with walker     _x__ Bathe/dress with assistance ___ Walk Independently    ___ Shower independently ___ Walk with assistance    ___ Shower with assistance ___ No alcohol     ___ Return to work/school ________  COMMUNITY REFERRALS UPON DISCHARGE:   Home Health:   PT     OT     ST     Agency:  [facility] Phone:  [phone] Medical Equipment/Items Ordered:  Wide rolling walker; tub seat with back  Agency/Supplier:  [facility]        Phone:  [phone]  GENERAL COMMUNITY RESOURCES FOR PATIENT/FAMILY: Support Groups:  [facility]                              [address]                              [place], [place]  [address]                              [phone]  [address]. [place] [phone]                                                             Meets the 2nd Thursday of each month at 11 AM - Call Marylyn Ishihara to register.  Special Instructions: Hold Arixtra until follow-up with neurosurgery Dr. [name]   My questions have been answered and I understand these instructions. I will adhere to these goals and the provided educational materials after my discharge from the hospital.  Patient/Caregiver Signature _______________________________ Date __________  Clinician Signature _______________________________________ Date __________  Please bring this form and your medication  list with you to all your follow-up doctor's appointments.

## 2017-05-16 NOTE — Progress Notes (Signed)
[facility] PHYSICAL MEDICINE & REHABILITATION     PROGRESS NOTE    Subjective/Complaints: Eating breakfast. No changes  ROS: Patient denies fever, rash, sore throat, blurred vision, nausea, vomiting, diarrhea, cough, shortness of breath or chest pain, joint or back pain, headache, or mood change. .    Objective: Vital Signs: Blood pressure 113/72, pulse 75, temperature 97.7 F (36.5 C), temperature source Oral, resp. rate 18, height 5\' 10"  (1.778 m), weight 83.4 kg (183 lb 13.8 oz), SpO2 100 %. No results found. No results for input(s): WBC, HGB, HCT, PLT in the last 72 hours. Recent Labs    05/15/17 0603  NA 138  K 4.2  CL 100*  GLUCOSE 99  BUN 9  CREATININE 0.87  CALCIUM 9.7   CBG (last 3)  No results for input(s): GLUCAP in the last 72 hours.  Wt Readings from Last 3 Encounters:  05/15/17 83.4 kg (183 lb 13.8 oz)  04/24/17 86.1 kg (189 lb 13.1 oz)  02/13/17 91.2 kg (201 lb)    Physical Exam:    Constitutional: No distress . Vital signs reviewed. HEENT: EOMI, oral membranes moist Neck: supple Cardiovascular: RRR without murmur. No JVD    Respiratory: CTA Bilaterally without wheezes or rales. Normal effort    GI: BS +, non-tender, non-distended  Neurological. Limited insight. Follows basic commands HOH.  Motor: strengh 4/5. Skin. Craniotomy sites are clean and dry, cranial deformities present Psych: engages but remains distracted--consistent  Assessment/Plan: 1. Functional and cognitive deficits secondary to SDH which require 3+ hours per day of interdisciplinary therapy in a comprehensive inpatient rehab setting. Physiatrist is providing close team supervision and 24 hour management of active medical problems listed below. Physiatrist and rehab team continue to assess barriers to discharge/monitor patient progress toward functional and medical goals.  Function:  Bathing Bathing position Bathing activity did not occur: N/A Position: Shower  Bathing  parts Body parts bathed by patient: Chest, Abdomen, Front perineal area, Right arm, Left arm, Right upper leg, Left upper leg, Buttocks, Right lower leg, Left lower leg, Back Body parts bathed by helper: Back  Bathing assist Assist Level: Supervision or verbal cues      Upper Body Dressing/Undressing Upper body dressing Upper body dressing/undressing activity did not occur: N/A What is the patient wearing?: Pull over shirt/dress     Pull over shirt/dress - Perfomed by patient: Put head through opening, Pull shirt over trunk, Thread/unthread right sleeve, Thread/unthread left sleeve Pull over shirt/dress - Perfomed by helper: Thread/unthread left sleeve, Pull shirt over trunk        Upper body assist Assist Level: Supervision or verbal cues   Set up : To obtain clothing/put away  Lower Body Dressing/Undressing Lower body dressing Lower body dressing/undressing activity did not occur: N/A What is the patient wearing?: Pants, Shoes, Socks, Underwear Underwear - Performed by patient: Thread/unthread right underwear leg, Thread/unthread left underwear leg, Pull underwear up/down   Pants- Performed by patient: Thread/unthread left pants leg, Pull pants up/down, Thread/unthread right pants leg Pants- Performed by helper: Thread/unthread right pants leg Non-skid slipper socks- Performed by patient: Don/doff right sock, Don/doff left sock Non-skid slipper socks- Performed by helper: Don/doff left sock Socks - Performed by patient: Don/doff right sock, Don/doff left sock   Shoes - Performed by patient: Don/doff right shoe, Don/doff left shoe Shoes - Performed by helper: Fasten right, Fasten left          Lower body assist Assist for lower body dressing: Touching or steadying assistance (  Pt > 75%)      Toileting Toileting Toileting activity did not occur: No continent bowel/bladder event Toileting steps completed by patient: Adjust clothing prior to toileting, Performs perineal hygiene,  Adjust clothing after toileting Toileting steps completed by helper: Performs perineal hygiene Toileting Assistive Devices: Grab bar or rail  Toileting assist Assist level: Touching or steadying assistance (Pt.75%)   Transfers Chair/bed transfer   Chair/bed transfer method: Ambulatory Chair/bed transfer assist level: Touching or steadying assistance (Pt > 75%) Chair/bed transfer assistive device: Armrests, Medical sales representative     Max distance: 150 Assist level: Touching or steadying assistance (Pt > 75%)   Wheelchair     Max wheelchair distance: 200' Assist Level: Supervision or verbal cues  Cognition Comprehension Comprehension assist level: Understands basic 50 - 74% of the time/ requires cueing 25 - 49% of the time  Expression Expression assist level: Expresses basic 25 - 49% of the time/requires cueing 50 - 75% of the time. Uses single words/gestures.  Social Interaction Social Interaction assist level: Interacts appropriately 50 - 74% of the time - May be physically or verbally inappropriate.  Problem Solving Problem solving assist level: Solves basic 25 - 49% of the time - needs direction more than half the time to initiate, plan or complete simple activities  Memory Memory assist level: Recognizes or recalls 25 - 49% of the time/requires cueing 50 - 75% of the time   Medical Problem List and Plan: 1.Decreased functional mobilitysecondary to right subdural hematoma after fall complicated by acute hypoxic respiratory failure and stridor as well as history of glioblastoma and seizure disorder -CIR PT, OT, SLP ---dc today   -Patient to see Rehab MD/provider in the office for transitional care encounter in 1-2 weeks.  2. DVT Prophylaxis/Anticoagulation/history of heparin-induced thrombocytopenia: SCDs. Arixtra currently on hold due to hematoma 3. Pain Management:Tylenol as needed 4. Mood:Prozac 20 mg daily 5. Neuropsych: This patientisnot fully  capable of making decisions on hisown behalf.   -  6. Skin/Wound Care:Routine skin checks 7. Fluids/Electrolytes/Nutrition:encourage PO    -good po intake in general    -normal labs   8.Seizure disorder. Vimpat 200 mg twice daily, Keppra 1500 mg twice daily. Follow-up neurology services Dr. Krista Blue as outpatient No recurrent seizures noted 9.Dysphagia. remains on Dysphagia #1 thin liquids.   -may be an opportunity to upgrade diet-does not appear to be having any difficulties with tolerance of current diet 10.Hypothyroidism. Synthroid 11.Hyperlipidemia. Lipitor  13.MRSA PCR screening positive. Maintain contact precautions 14.Constipation. Laxative assistance 15. Hyponatremia  Sodium 138 4/29         LOS (Days) 22 A FACE TO FACE EVALUATION WAS PERFORMED  Meredith Staggers, MD 05/16/2017 9:00 AM

## 2017-05-16 NOTE — Discharge Summary (Signed)
Glen [name], Glen [name]              ACCOUNT NO.:  [id number]  MEDICAL RECORD NO.:  [id number]  LOCATION:  4W16C                        FACILITY:  [facility]  PHYSICIAN:  Glen [name], M.D.DATE OF BIRTH:  11-29-1970  DATE OF ADMISSION:  04/24/2017 DATE OF DISCHARGE:  05/16/2017                              DISCHARGE SUMMARY   DISCHARGE DIAGNOSES: 1. Right subdural hematoma after a fall, complicated by acute hypoxic     respiratory failure. 2. Deep venous thrombosis prophylaxis with SCDs. 3. Pain management. 4. Mood. 5. Seizure disorder. 6. Dysphagia. 7. Hypothyroidism. 8. Hyperlipidemia. 9. MRSA PCR screening positive. 10.Constipation. 11.Hyponatremia, improved.  HISTORY OF PRESENT ILLNESS:  This is a 47 year old right-handed male with history of glioblastoma, status post surgical removal of tumor, VP shunt in 1993, and autoimmune cerebritis, prior CVA 2015, heparin- induced thrombocytopenia, on Arixtra, seizure disorder, followed by Dr. Krista [name].  Presented April 09, 2017, upon transfer from [facility] with altered mental status as well as reports of falls.  Per chart review, used a walker, able speak and interact with family.  There were concerns of possible sepsis, low-grade fever, elevated white blood cell count, treated with Rocephin while at [facility].  Cranial CT scan from outside hospital showed large subdural hematoma on the right, required intubation, difficulty maintaining airway and noted inspiratory stridor, requiring intubation by ENT prior to going to the operating room where he underwent craniotomy, hematoma evacuation on April 10, 2017, by Dr. Vertell [name].  Hospital course, seizures.  EEG showed generalized cerebral dysfunction, followed by Neurology Services.  Maintained on Keppra as well as the addition of Vimpat.  Slowly weaned from ventilator.  Later underwent transnasal fiberoptic laryngoscopy April 18, 2017, per Dr. Redmond [name] after initial stridor and  followup after extubation. Bouts of leukocytosis, felt to be reactive to steroids.  MRSA PCR screening positive.  Contact precautions.  Dysphagia #1 thin liquid diet.  The patient was admitted for comprehensive rehab program.  PAST MEDICAL HISTORY:  See discharge diagnoses.  SOCIAL HISTORY:  Used a walker prior to admission.  Could interact with family.  Functional status upon admission to [place] was +2, physical assist sit to stand, minimal assist ambulate 150 feet rolling walker.  Max assistance for activities of daily living.  PHYSICAL EXAMINATION:  VITAL SIGNS:  Blood pressure 109/77, pulse 104, temperature 98, respirations 18. GENERAL:  This was an alert male, sitting up in the chair, he did make eye contact with examiner.  He would attempt to voice some responses by basic questions, inconsistent to follow 1-step commands.  Struggled to initiate basic tasks and perseveration. HEENT:  EOMs intact. NECK:  Supple.  Nontender.  No JVD. CARDIAC:  Rate controlled. ABDOMEN:  Soft, nontender.  Good bowel sounds. NEUROLOGIC:  Craniotomy site clean and dry.  REHABILITATION HOSPITAL COURSE:  The patient was admitted to Inpatient Rehab Services.  Therapies initiated on a 3-hour daily basis, consisting of physical therapy, occupational therapy, speech therapy, and rehabilitation nursing.  The following issues were addressed during the patient's rehabilitation stay.  Pertaining to [name] traumatic right subdural hematoma, he had undergone craniotomy complicated by acute respiratory failure.  He would follow up Neurosurgery.  Noted history of  heparin-induced thrombocytopenia.  He had been on Arixtra prior to admission.  This remained on hold secondary to hematoma and would follow up at the discretion of Neurosurgery.  He continued on Prozac for mood, emotional support provided.  Seizure disorder.  Followed by Dr. Krista [name], he continued on Vimpat as well as Keppra.  No further  seizure activity noted.  Maintained on a dysphagia #1 thin liquid diet.  Hormone supplement for hypothyroidism. Lipitor ongoing for hyperlipidemia.  Contact precautions for MRSA PCR screening positive, remaining afebrile.  Bouts of hyponatremia, latest sodium of 136.  The patient received weekly collaborative interdisciplinary team conferences to discuss estimated length of stay, family teaching, any barriers to his discharge.  Performed dynamic sitting during family functional task including putting on his shoes with moderate cues for safety.  Ambulating 150 feet x2 rolling walker, up to moderate assist for some ataxia.  Standing balance tasks in the parallel bars for reaching across midline.  Had some difficulty with motor planning.  Activities of daily living and homemaking.  Sister and family ongoing with education.  Again noted ataxic movements and clonus type movements in his feet.  He did do better when his family was at hand.  Ongoing issues discussed on discharge planning.  Family was to arrange the necessary assistance at home.  Discharge taking place May 16, 2017.  DISCHARGE MEDICATIONS:  Included; 1. Lipitor 20 mg p.o. daily. 2. Prozac 20 mg p.o. daily. 3. Vimpat 200 mg p.o. b.i.d. 4. Keppra 1500 mg p.o. b.i.d. 5. Synthroid 50 mcg p.o. at breakfast. 6. MiraLAX daily, hold for loose stool. 7. Tylenol as needed.  DIET:  His diet was a dysphagia #1 thin liquid diet.  FOLLOWUP:  He would follow up with Dr. Alger [name] at the Outpatient Rehab Service Office as directed; Dr. Erline [name], Neurosurgery, call for appointment; Dr. Krista [name], Neurology Services as directed, Glen [name], physician assistant as per [facility] PCP Medical Management.  SPECIAL INSTRUCTIONS:  Continue to hold Arixtra until followup with Neurosurgery, Dr. Vertell [name].     Glen [name], P.A.   ______________________________ Glen [name], M.D.    DA/MEDQ  D:  05/15/2017  T:   05/16/2017  Job:  [id number]  cc:   Glen [name]. Glen [name], M.D. Glen Jump, PA Glen [name], M.D. Glen Pacas, MD

## 2017-05-16 NOTE — Progress Notes (Signed)
PT's family got d/c instructions.Pt. Is ready to go home with family.

## 2017-05-17 ENCOUNTER — Telehealth: Payer: Self-pay

## 2017-05-17 DIAGNOSIS — Z9181 History of falling: Secondary | ICD-10-CM | POA: Diagnosis not present

## 2017-05-17 DIAGNOSIS — G40909 Epilepsy, unspecified, not intractable, without status epilepticus: Secondary | ICD-10-CM | POA: Diagnosis not present

## 2017-05-17 DIAGNOSIS — S065X0D Traumatic subdural hemorrhage without loss of consciousness, subsequent encounter: Secondary | ICD-10-CM | POA: Diagnosis not present

## 2017-05-17 DIAGNOSIS — Z982 Presence of cerebrospinal fluid drainage device: Secondary | ICD-10-CM | POA: Diagnosis not present

## 2017-05-17 DIAGNOSIS — R1310 Dysphagia, unspecified: Secondary | ICD-10-CM | POA: Diagnosis not present

## 2017-05-17 DIAGNOSIS — Z85841 Personal history of malignant neoplasm of brain: Secondary | ICD-10-CM | POA: Diagnosis not present

## 2017-05-17 DIAGNOSIS — E785 Hyperlipidemia, unspecified: Secondary | ICD-10-CM | POA: Diagnosis not present

## 2017-05-17 DIAGNOSIS — Z8673 Personal history of transient ischemic attack (TIA), and cerebral infarction without residual deficits: Secondary | ICD-10-CM | POA: Diagnosis not present

## 2017-05-17 DIAGNOSIS — E039 Hypothyroidism, unspecified: Secondary | ICD-10-CM | POA: Diagnosis not present

## 2017-05-17 NOTE — Progress Notes (Signed)
Social Work Discharge Note  The overall goal for the admission was met for:   Discharge location: Yes - home with family  Length of Stay: Yes - 22 days  Discharge activity level: Yes - supervision  Home/community participation: Yes  Services provided included: MD, RD, PT, OT, SLP, RN, Pharmacy and SW  Financial Services: Medicare and Medicaid  Follow-up services arranged: Home Health: PT/OT/ST from [place], DME: wide rolling walker and tub seat with a back from [place] and Patient/Family has no preference for HH/DME agencies  Comments (or additional information):  Pt's siblings came for family education and are planning to help their mother, as she is still physically recovering from her own heart procedure.  CSW also made PCS referral to assist with aides to give family some respite.    Patient/Family verbalized understanding of follow-up arrangements: Yes  Individual responsible for coordination of the follow-up plan: pt's mother  Confirmed correct DME delivered: Trey Sailors 05/17/2017    Hadyn Blanck, Silvestre Mesi

## 2017-05-17 NOTE — Telephone Encounter (Signed)
Crystal PT [facility] called requesting verbal orders for this patient for 2xwk X 3wks.  Called her back and approved verbal orders.

## 2017-05-18 ENCOUNTER — Telehealth: Payer: Self-pay | Admitting: Registered Nurse

## 2017-05-18 DIAGNOSIS — G40909 Epilepsy, unspecified, not intractable, without status epilepticus: Secondary | ICD-10-CM | POA: Diagnosis not present

## 2017-05-18 DIAGNOSIS — S065X0D Traumatic subdural hemorrhage without loss of consciousness, subsequent encounter: Secondary | ICD-10-CM | POA: Diagnosis not present

## 2017-05-18 DIAGNOSIS — E785 Hyperlipidemia, unspecified: Secondary | ICD-10-CM | POA: Diagnosis not present

## 2017-05-18 DIAGNOSIS — R1310 Dysphagia, unspecified: Secondary | ICD-10-CM | POA: Diagnosis not present

## 2017-05-18 DIAGNOSIS — E039 Hypothyroidism, unspecified: Secondary | ICD-10-CM | POA: Diagnosis not present

## 2017-05-18 DIAGNOSIS — Z8673 Personal history of transient ischemic attack (TIA), and cerebral infarction without residual deficits: Secondary | ICD-10-CM | POA: Diagnosis not present

## 2017-05-18 NOTE — Telephone Encounter (Signed)
Transitional Care call Transitional Care Call Completed, Appointment Confirmed, Address Confirmed, New Patient Packet Mail Transitional Care Call Questions answered by Mother [name]  Patient name: Glen [name] DOB: 1970/08/31 1. Are you/is patient experiencing any problems since coming home? No a. Are there any questions regarding any aspect of care? No 2. Are there any questions regarding medications administration/dosing? No a. Are meds being taken as prescribed? Yes b. "Patient should review meds with caller to confirm"  Medication List Reviewed 3. Have there been any falls? No 4. Has Home Health been to the house and/or have they contacted you? Yes, [facility] a. If not, have you tried to contact them? NA b. Can we help you contact them? NA 5. Are bowels and bladder emptying properly? Yes a. Are there any unexpected incontinence issues? No b. If applicable, is patient following bowel/bladder programs? NA 6. Any fevers, problems with breathing, unexpected pain? No 7. Are there any skin problems or new areas of breakdown? No 8. Has the patient/family member arranged specialty MD follow up (ie cardiology/neurology/renal/surgical/etc.)?  No, [name] ( mother) was encouraged to make calls today, she verbalizes understanding.  a. Can we help arrange? No 9. Does the patient need any other services or support that we can help arrange? No 10. Are caregivers following through as expected in assisting the patient? Yes 11. Has the patient quit smoking, drinking alcohol, or using drugs as recommended? [name]  36. ( mother), states [name] doesn't smoke, drink alcohol or use illicit drugs.   Appointment date/time 05/24/2017 arrival time 10:40 for 11:00 appointment with Dr. [name]. At [facility]

## 2017-05-18 NOTE — Discharge Summary (Signed)
Physician Discharge Summary  Patient ID: DEIDRICK RAINEY MRN: [id number] DOB/AGE: January 05, 1971 47 y.o.  Admit date: 04/09/2017 Discharge date: 05/18/2017  Admission Diagnoses:Right fronto-temporal subdural hematoma   Discharge Diagnoses: Same; Seizure disorder Active Problems:   Subdural hematoma (HCC)   Abnormal movements   Discharged Condition: fair  Hospital Course: Patient underwent right craniotomy for subdural hematoma.  He remained intubated after surgery and was evaluated by Critical Care Medicine.  The patient was extubated, was seen by Neurology and required EEG monitoring and seizure management with Keppra 1500 mg BID and Vimpat. Marland Kitchen  He was extubated, mobilized with PT and was felt to be a good candidate for inpatient Rehabilitation and was transferred to the Rehab service for intensive inpatient therapies.   Consults: pulmonary/intensive care, neurology and rehabilitation medicine  Significant Diagnostic Studies: radiology: CT scan: head, EEG  Treatments: surgery: Right craniotomy for SDH  Discharge Exam: Blood pressure 104/76, pulse 97, temperature 97.6 F (36.4 C), resp. rate (!) 22, height 5\' 10"  (1.778 m), weight 86.1 kg (189 lb 13.1 oz), SpO2 95 %. Awake, alert, limited verbal responses.  Limited ability to ambulate.  Cranial incision CDI.    Disposition: Rehab.   Allergies as of 04/24/2017      Reactions   Heparin Anaphylaxis   Patient has HITT   Lidocaine Hives   Depakote [divalproex Sodium] Rash   Phenytoin Sodium Extended Rash      Medication List    STOP taking these medications   levothyroxine 50 MCG tablet Commonly known as:  SYNTHROID, LEVOTHROID     TAKE these medications   loratadine 10 MG tablet Commonly known as:  CLARITIN Take 10 mg by mouth daily.        Signed: [name], MD 05/18/2017, 4:00 PM

## 2017-05-19 DIAGNOSIS — S065X0D Traumatic subdural hemorrhage without loss of consciousness, subsequent encounter: Secondary | ICD-10-CM | POA: Diagnosis not present

## 2017-05-19 DIAGNOSIS — R1310 Dysphagia, unspecified: Secondary | ICD-10-CM | POA: Diagnosis not present

## 2017-05-19 DIAGNOSIS — Z8673 Personal history of transient ischemic attack (TIA), and cerebral infarction without residual deficits: Secondary | ICD-10-CM | POA: Diagnosis not present

## 2017-05-19 DIAGNOSIS — G40909 Epilepsy, unspecified, not intractable, without status epilepticus: Secondary | ICD-10-CM | POA: Diagnosis not present

## 2017-05-19 DIAGNOSIS — E039 Hypothyroidism, unspecified: Secondary | ICD-10-CM | POA: Diagnosis not present

## 2017-05-19 DIAGNOSIS — E785 Hyperlipidemia, unspecified: Secondary | ICD-10-CM | POA: Diagnosis not present

## 2017-05-23 DIAGNOSIS — G40909 Epilepsy, unspecified, not intractable, without status epilepticus: Secondary | ICD-10-CM | POA: Diagnosis not present

## 2017-05-23 DIAGNOSIS — E039 Hypothyroidism, unspecified: Secondary | ICD-10-CM | POA: Diagnosis not present

## 2017-05-23 DIAGNOSIS — S065X0D Traumatic subdural hemorrhage without loss of consciousness, subsequent encounter: Secondary | ICD-10-CM | POA: Diagnosis not present

## 2017-05-23 DIAGNOSIS — E785 Hyperlipidemia, unspecified: Secondary | ICD-10-CM | POA: Diagnosis not present

## 2017-05-23 DIAGNOSIS — Z8673 Personal history of transient ischemic attack (TIA), and cerebral infarction without residual deficits: Secondary | ICD-10-CM | POA: Diagnosis not present

## 2017-05-23 DIAGNOSIS — R1310 Dysphagia, unspecified: Secondary | ICD-10-CM | POA: Diagnosis not present

## 2017-05-24 ENCOUNTER — Encounter: Payer: Self-pay | Admitting: Physical Medicine & Rehabilitation

## 2017-05-24 ENCOUNTER — Encounter: Payer: Medicare Other | Attending: Physical Medicine & Rehabilitation | Admitting: Physical Medicine & Rehabilitation

## 2017-05-24 VITALS — BP 115/78 | HR 82 | Resp 14 | Ht 71.0 in | Wt 193.0 lb

## 2017-05-24 DIAGNOSIS — R269 Unspecified abnormalities of gait and mobility: Secondary | ICD-10-CM | POA: Diagnosis not present

## 2017-05-24 DIAGNOSIS — S065X3S Traumatic subdural hemorrhage with loss of consciousness of 1 hour to 5 hours 59 minutes, sequela: Secondary | ICD-10-CM | POA: Diagnosis not present

## 2017-05-24 DIAGNOSIS — G40909 Epilepsy, unspecified, not intractable, without status epilepticus: Secondary | ICD-10-CM | POA: Diagnosis not present

## 2017-05-24 DIAGNOSIS — R569 Unspecified convulsions: Secondary | ICD-10-CM

## 2017-05-24 DIAGNOSIS — Z8673 Personal history of transient ischemic attack (TIA), and cerebral infarction without residual deficits: Secondary | ICD-10-CM | POA: Diagnosis not present

## 2017-05-24 DIAGNOSIS — Z9889 Other specified postprocedural states: Secondary | ICD-10-CM | POA: Insufficient documentation

## 2017-05-24 DIAGNOSIS — R1310 Dysphagia, unspecified: Secondary | ICD-10-CM | POA: Diagnosis not present

## 2017-05-24 DIAGNOSIS — E039 Hypothyroidism, unspecified: Secondary | ICD-10-CM | POA: Diagnosis not present

## 2017-05-24 NOTE — Patient Instructions (Signed)
PLEASE FEEL FREE TO CALL OUR OFFICE WITH ANY PROBLEMS OR QUESTIONS ([phone])      

## 2017-05-24 NOTE — Progress Notes (Signed)
Subjective:    Patient ID: KYM FENTER, male    DOB: 04-06-1970, 47 y.o.   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 He has continued to improve but family states he still has delays in comprehension and processing which aren't back to baseline. He is sleeping well. Mom keeps a urinal at bedside. He is using a hospital bed which is old. They are looking into a new one. HH therapies are coming out to the house.   He hasn't had any falls using his walker. He had a near miss but family was near by.  His appetite is vigorous.  His bowels and bladder are working well.  Family reports no changes in his mood.  He does not talk a whole lot but does do better around family and on topics he enjoys.  He used to be a Company secretary.  He remains on the seizure medications including Vimpat and Keppra.  No seizure activity has occurred since being home.  Mom was concerned about a small area of bleeding on his scalp and wanted to show me today.  Pain Inventory Average Pain 0 Pain Right Now 0 My pain is no pain  In the last 24 hours, has pain interfered with the following? General activity 0 Relation with others 0 Enjoyment of life 0 What TIME of day is your pain at its worst? no pain Sleep (in general) Fair  Pain is worse with: no pain Pain improves with: no pain Relief from Meds: no pain  Mobility walk with assistance use a walker ability to climb steps?  no do you drive?  no use a wheelchair needs help with transfers Do you have any goals in this area?  yes  Function disabled: date disabled . I need assistance with the following:  dressing  Neuro/Psych weakness trouble walking dizziness confusion  Prior Studies Any changes since last visit?  no  Physicians involved in your care Any changes since last visit?  no   Family History  Adopted: Yes   Social History   Socioeconomic History  .  Marital status: Married    Spouse name: Not on file  . Number of children: 1  . Years of education: college  . Highest education level: Not on file  Occupational History  . Occupation: Disabled  Social Needs  . Financial resource strain: Not on file  . Food insecurity:    Worry: Not on file    Inability: Not on file  . Transportation needs:    Medical: Not on file    Non-medical: Not on file  Tobacco Use  . Smoking status: Never Smoker  . Smokeless tobacco: Never Used  Substance and Sexual Activity  . Alcohol use: No    Frequency: Never  . Drug use: No  . Sexual activity: Not on file  Lifestyle  . Physical activity:    Days per week: Not on file    Minutes per session: Not on file  . Stress: Not on file  Relationships  . Social connections:    Talks on phone: Not on file    Gets together: Not on file    Attends religious service: Not on file    Active member of club or organization: Not on file    Attends meetings of clubs or organizations: Not on file    Relationship status: Not on file  Other Topics Concern  .  Not on file  Social History Narrative   Lives at home with parents.   Right-handed.   Occasional caffeine.   Past Surgical History:  Procedure Laterality Date  . APPENDECTOMY     1980s  . BRAIN SURGERY     1993  . CRANIOTOMY Right 04/09/2017   Procedure: CRANIOTOMY HEMATOMA EVACUATION SUBDURAL;  Surgeon: Erline Levine, MD;  Location: [place];  Service: Neurosurgery;  Laterality: Right;  . DIRECT LARYNGOSCOPY N/A 04/09/2017   Procedure: DIRECT LARYNGOSCOPY;  Surgeon: Erline Levine, MD;  Location: [place];  Service: Neurosurgery;  Laterality: N/A;  . DIRECT LARYNGOSCOPY N/A 04/09/2017   Procedure: DIRECT LARYNGOSCOPY;  Surgeon: [name], MD;  Location: [place];  Service: ENT;  Laterality: N/A;  . DIRECT LARYNGOSCOPY N/A 04/18/2017   Procedure: DIRECT LARYNGOSCOPY;  Surgeon: [name], MD;  Location: [place];  Service: ENT;  Laterality: N/A;  . TRACHEOSTOMY  TUBE PLACEMENT N/A 04/18/2017   Procedure: TRANSNASAL FIBER OPTIC LARYNGOSCOPY;  Surgeon: [name], MD;  Location: [place];  Service: ENT;  Laterality: N/A;   Past Medical History:  Diagnosis Date  . Brain cancer ([place])   . Hypercholesteremia   . Seizure ([place])   . Stroke ([place])   . Thyroid disorder    BP 115/78 (BP Location: Right Arm, Patient Position: Sitting, Cuff Size: Normal)   Pulse 82   Resp 14   Ht 5\' 11"  (1.803 m)   Wt 193 lb (87.5 kg)   SpO2 98%   BMI 26.92 kg/m   Opioid Risk Score:   Fall Risk Score:  `1  Depression screen PHQ 2/9  No flowsheet data found.  Review of Systems  Constitutional: Negative.   HENT: Negative.   Eyes: Negative.   Respiratory: Negative.   Cardiovascular: Negative.   Gastrointestinal: Negative.   Endocrine: Negative.   Genitourinary: Negative.   Musculoskeletal: Positive for gait problem.  Skin: Negative.   Allergic/Immunologic: Negative.   Neurological: Positive for dizziness, speech difficulty and weakness.  Psychiatric/Behavioral: Positive for confusion.  All other systems reviewed and are negative.      Objective:   Physical Exam General: No acute distress HEENT: EOMI, oral membranes moist. Scalp irregular due to prior surgeries Cards: reg rate  Chest: normal effort Abdomen: Soft, NT, ND Skin: dry, intact Extremities: no edema Neurological. Limited insight. Follows basic commands.  Patient has processing delays and definite motor and linguistic apraxia.  He does well with automatic movements.  Once he starts in activity is sometimes difficult to redirect him to something else. HOH left greater than right.  Motor: strengh 4/5. Skin. Craniotomy sites are clean and dry, cranial deformities present.  Healing areas from surgery have small scab still present.  The one area which bled slightly before is a small scab and appears another larger scab had come off.  Appears to be retained piece of absorbable suture at the end of  the incision also. Psych: Patient is flat but pleasant         Assessment & Plan:  1.Decreased functional mobilitysecondary to right subdural hematoma after fall complicated by acute hypoxic respiratory failure and stridor as well as history of glioblastoma and seizure disorder -Continue with home health therapies including PT OT and speech.  Would like to advance him to outpatient therapies at some point probably at [facility].    -Discussed his walker which appears to be appropriate.  He just needs to work on staying at a better distance from the walker.  The walker  tends to get away from him at times. 2.  Pain Management:Tylenol as needed 3. Mood:Prozac 20 mg daily Seizure disorder. Vimpat 200 mg twice daily, Keppra 1500 mg twice daily. Follow-up neurology services Dr. Krista Blue   No recurrent seizures noted 4.Dysphagia.  Patient doing well with nearly regular diet.  Mom shops up some of his food and has good awareness in this regard.   5.Hypothyroidism. Synthroid    Follow-up with me in about 6 weeks time.  30 minutes of direct patient care time was spent in the office today.

## 2017-05-25 DIAGNOSIS — E039 Hypothyroidism, unspecified: Secondary | ICD-10-CM | POA: Diagnosis not present

## 2017-05-25 DIAGNOSIS — G40909 Epilepsy, unspecified, not intractable, without status epilepticus: Secondary | ICD-10-CM | POA: Diagnosis not present

## 2017-05-25 DIAGNOSIS — Z8673 Personal history of transient ischemic attack (TIA), and cerebral infarction without residual deficits: Secondary | ICD-10-CM | POA: Diagnosis not present

## 2017-05-25 DIAGNOSIS — E034 Atrophy of thyroid (acquired): Secondary | ICD-10-CM | POA: Diagnosis not present

## 2017-05-25 DIAGNOSIS — R1310 Dysphagia, unspecified: Secondary | ICD-10-CM | POA: Diagnosis not present

## 2017-05-25 DIAGNOSIS — E785 Hyperlipidemia, unspecified: Secondary | ICD-10-CM | POA: Diagnosis not present

## 2017-05-25 DIAGNOSIS — S065X0D Traumatic subdural hemorrhage without loss of consciousness, subsequent encounter: Secondary | ICD-10-CM | POA: Diagnosis not present

## 2017-05-25 DIAGNOSIS — I6202 Nontraumatic subacute subdural hemorrhage: Secondary | ICD-10-CM | POA: Diagnosis not present

## 2017-05-29 DIAGNOSIS — G40909 Epilepsy, unspecified, not intractable, without status epilepticus: Secondary | ICD-10-CM | POA: Diagnosis not present

## 2017-05-29 DIAGNOSIS — R1310 Dysphagia, unspecified: Secondary | ICD-10-CM | POA: Diagnosis not present

## 2017-05-29 DIAGNOSIS — Z85841 Personal history of malignant neoplasm of brain: Secondary | ICD-10-CM | POA: Diagnosis not present

## 2017-05-29 DIAGNOSIS — Z8673 Personal history of transient ischemic attack (TIA), and cerebral infarction without residual deficits: Secondary | ICD-10-CM | POA: Diagnosis not present

## 2017-05-29 DIAGNOSIS — Z9181 History of falling: Secondary | ICD-10-CM | POA: Diagnosis not present

## 2017-05-29 DIAGNOSIS — E785 Hyperlipidemia, unspecified: Secondary | ICD-10-CM | POA: Diagnosis not present

## 2017-05-29 DIAGNOSIS — Z982 Presence of cerebrospinal fluid drainage device: Secondary | ICD-10-CM | POA: Diagnosis not present

## 2017-05-29 DIAGNOSIS — E039 Hypothyroidism, unspecified: Secondary | ICD-10-CM | POA: Diagnosis not present

## 2017-05-29 DIAGNOSIS — S065X0D Traumatic subdural hemorrhage without loss of consciousness, subsequent encounter: Secondary | ICD-10-CM | POA: Diagnosis not present

## 2017-05-30 DIAGNOSIS — Z8673 Personal history of transient ischemic attack (TIA), and cerebral infarction without residual deficits: Secondary | ICD-10-CM | POA: Diagnosis not present

## 2017-05-30 DIAGNOSIS — E039 Hypothyroidism, unspecified: Secondary | ICD-10-CM | POA: Diagnosis not present

## 2017-05-30 DIAGNOSIS — R1310 Dysphagia, unspecified: Secondary | ICD-10-CM | POA: Diagnosis not present

## 2017-05-30 DIAGNOSIS — S065X0D Traumatic subdural hemorrhage without loss of consciousness, subsequent encounter: Secondary | ICD-10-CM | POA: Diagnosis not present

## 2017-05-30 DIAGNOSIS — E785 Hyperlipidemia, unspecified: Secondary | ICD-10-CM | POA: Diagnosis not present

## 2017-05-30 DIAGNOSIS — G40909 Epilepsy, unspecified, not intractable, without status epilepticus: Secondary | ICD-10-CM | POA: Diagnosis not present

## 2017-06-01 DIAGNOSIS — E785 Hyperlipidemia, unspecified: Secondary | ICD-10-CM | POA: Diagnosis not present

## 2017-06-01 DIAGNOSIS — G40909 Epilepsy, unspecified, not intractable, without status epilepticus: Secondary | ICD-10-CM | POA: Diagnosis not present

## 2017-06-01 DIAGNOSIS — R1310 Dysphagia, unspecified: Secondary | ICD-10-CM | POA: Diagnosis not present

## 2017-06-01 DIAGNOSIS — Z8673 Personal history of transient ischemic attack (TIA), and cerebral infarction without residual deficits: Secondary | ICD-10-CM | POA: Diagnosis not present

## 2017-06-01 DIAGNOSIS — S065X0D Traumatic subdural hemorrhage without loss of consciousness, subsequent encounter: Secondary | ICD-10-CM | POA: Diagnosis not present

## 2017-06-01 DIAGNOSIS — E039 Hypothyroidism, unspecified: Secondary | ICD-10-CM | POA: Diagnosis not present

## 2017-06-02 DIAGNOSIS — E039 Hypothyroidism, unspecified: Secondary | ICD-10-CM | POA: Diagnosis not present

## 2017-06-02 DIAGNOSIS — S065X0D Traumatic subdural hemorrhage without loss of consciousness, subsequent encounter: Secondary | ICD-10-CM | POA: Diagnosis not present

## 2017-06-02 DIAGNOSIS — Z8673 Personal history of transient ischemic attack (TIA), and cerebral infarction without residual deficits: Secondary | ICD-10-CM | POA: Diagnosis not present

## 2017-06-02 DIAGNOSIS — G40909 Epilepsy, unspecified, not intractable, without status epilepticus: Secondary | ICD-10-CM | POA: Diagnosis not present

## 2017-06-02 DIAGNOSIS — E785 Hyperlipidemia, unspecified: Secondary | ICD-10-CM | POA: Diagnosis not present

## 2017-06-02 DIAGNOSIS — R1310 Dysphagia, unspecified: Secondary | ICD-10-CM | POA: Diagnosis not present

## 2017-06-06 DIAGNOSIS — G40909 Epilepsy, unspecified, not intractable, without status epilepticus: Secondary | ICD-10-CM | POA: Diagnosis not present

## 2017-06-06 DIAGNOSIS — E039 Hypothyroidism, unspecified: Secondary | ICD-10-CM | POA: Diagnosis not present

## 2017-06-06 DIAGNOSIS — Z8673 Personal history of transient ischemic attack (TIA), and cerebral infarction without residual deficits: Secondary | ICD-10-CM | POA: Diagnosis not present

## 2017-06-06 DIAGNOSIS — R1310 Dysphagia, unspecified: Secondary | ICD-10-CM | POA: Diagnosis not present

## 2017-06-06 DIAGNOSIS — S065X0D Traumatic subdural hemorrhage without loss of consciousness, subsequent encounter: Secondary | ICD-10-CM | POA: Diagnosis not present

## 2017-06-06 DIAGNOSIS — E785 Hyperlipidemia, unspecified: Secondary | ICD-10-CM | POA: Diagnosis not present

## 2017-06-08 ENCOUNTER — Telehealth: Payer: Self-pay

## 2017-06-08 DIAGNOSIS — G40909 Epilepsy, unspecified, not intractable, without status epilepticus: Secondary | ICD-10-CM | POA: Diagnosis not present

## 2017-06-08 DIAGNOSIS — E039 Hypothyroidism, unspecified: Secondary | ICD-10-CM | POA: Diagnosis not present

## 2017-06-08 DIAGNOSIS — Z8673 Personal history of transient ischemic attack (TIA), and cerebral infarction without residual deficits: Secondary | ICD-10-CM | POA: Diagnosis not present

## 2017-06-08 DIAGNOSIS — S065X0D Traumatic subdural hemorrhage without loss of consciousness, subsequent encounter: Secondary | ICD-10-CM | POA: Diagnosis not present

## 2017-06-08 DIAGNOSIS — R1310 Dysphagia, unspecified: Secondary | ICD-10-CM | POA: Diagnosis not present

## 2017-06-08 DIAGNOSIS — E785 Hyperlipidemia, unspecified: Secondary | ICD-10-CM | POA: Diagnosis not present

## 2017-06-08 NOTE — Telephone Encounter (Signed)
Daleen Snook PT at [facility] called requesting verbal orders of extension on pt therapy. Therapy next week, skip a week, therapy, skip a week, therapy, then discharge. Verbal orders given to pt.

## 2017-06-13 DIAGNOSIS — R1310 Dysphagia, unspecified: Secondary | ICD-10-CM | POA: Diagnosis not present

## 2017-06-13 DIAGNOSIS — E785 Hyperlipidemia, unspecified: Secondary | ICD-10-CM | POA: Diagnosis not present

## 2017-06-13 DIAGNOSIS — E039 Hypothyroidism, unspecified: Secondary | ICD-10-CM | POA: Diagnosis not present

## 2017-06-13 DIAGNOSIS — G40909 Epilepsy, unspecified, not intractable, without status epilepticus: Secondary | ICD-10-CM | POA: Diagnosis not present

## 2017-06-13 DIAGNOSIS — S065X0D Traumatic subdural hemorrhage without loss of consciousness, subsequent encounter: Secondary | ICD-10-CM | POA: Diagnosis not present

## 2017-06-13 DIAGNOSIS — Z8673 Personal history of transient ischemic attack (TIA), and cerebral infarction without residual deficits: Secondary | ICD-10-CM | POA: Diagnosis not present

## 2017-06-14 DIAGNOSIS — Z8673 Personal history of transient ischemic attack (TIA), and cerebral infarction without residual deficits: Secondary | ICD-10-CM | POA: Diagnosis not present

## 2017-06-14 DIAGNOSIS — E785 Hyperlipidemia, unspecified: Secondary | ICD-10-CM | POA: Diagnosis not present

## 2017-06-14 DIAGNOSIS — E039 Hypothyroidism, unspecified: Secondary | ICD-10-CM | POA: Diagnosis not present

## 2017-06-14 DIAGNOSIS — G40909 Epilepsy, unspecified, not intractable, without status epilepticus: Secondary | ICD-10-CM | POA: Diagnosis not present

## 2017-06-14 DIAGNOSIS — R1310 Dysphagia, unspecified: Secondary | ICD-10-CM | POA: Diagnosis not present

## 2017-06-14 DIAGNOSIS — S065X0D Traumatic subdural hemorrhage without loss of consciousness, subsequent encounter: Secondary | ICD-10-CM | POA: Diagnosis not present

## 2017-06-15 DIAGNOSIS — R1310 Dysphagia, unspecified: Secondary | ICD-10-CM | POA: Diagnosis not present

## 2017-06-15 DIAGNOSIS — G40909 Epilepsy, unspecified, not intractable, without status epilepticus: Secondary | ICD-10-CM | POA: Diagnosis not present

## 2017-06-15 DIAGNOSIS — Z8673 Personal history of transient ischemic attack (TIA), and cerebral infarction without residual deficits: Secondary | ICD-10-CM | POA: Diagnosis not present

## 2017-06-15 DIAGNOSIS — E785 Hyperlipidemia, unspecified: Secondary | ICD-10-CM | POA: Diagnosis not present

## 2017-06-15 DIAGNOSIS — S065X0D Traumatic subdural hemorrhage without loss of consciousness, subsequent encounter: Secondary | ICD-10-CM | POA: Diagnosis not present

## 2017-06-15 DIAGNOSIS — E039 Hypothyroidism, unspecified: Secondary | ICD-10-CM | POA: Diagnosis not present

## 2017-06-20 DIAGNOSIS — E039 Hypothyroidism, unspecified: Secondary | ICD-10-CM | POA: Diagnosis not present

## 2017-06-20 DIAGNOSIS — E785 Hyperlipidemia, unspecified: Secondary | ICD-10-CM | POA: Diagnosis not present

## 2017-06-20 DIAGNOSIS — G40909 Epilepsy, unspecified, not intractable, without status epilepticus: Secondary | ICD-10-CM | POA: Diagnosis not present

## 2017-06-20 DIAGNOSIS — Z8673 Personal history of transient ischemic attack (TIA), and cerebral infarction without residual deficits: Secondary | ICD-10-CM | POA: Diagnosis not present

## 2017-06-20 DIAGNOSIS — R1310 Dysphagia, unspecified: Secondary | ICD-10-CM | POA: Diagnosis not present

## 2017-06-20 DIAGNOSIS — S065X0D Traumatic subdural hemorrhage without loss of consciousness, subsequent encounter: Secondary | ICD-10-CM | POA: Diagnosis not present

## 2017-06-27 DIAGNOSIS — S065X0D Traumatic subdural hemorrhage without loss of consciousness, subsequent encounter: Secondary | ICD-10-CM | POA: Diagnosis not present

## 2017-06-27 DIAGNOSIS — E785 Hyperlipidemia, unspecified: Secondary | ICD-10-CM | POA: Diagnosis not present

## 2017-06-27 DIAGNOSIS — R1310 Dysphagia, unspecified: Secondary | ICD-10-CM | POA: Diagnosis not present

## 2017-06-27 DIAGNOSIS — G40909 Epilepsy, unspecified, not intractable, without status epilepticus: Secondary | ICD-10-CM | POA: Diagnosis not present

## 2017-06-27 DIAGNOSIS — E039 Hypothyroidism, unspecified: Secondary | ICD-10-CM | POA: Diagnosis not present

## 2017-06-27 DIAGNOSIS — Z8673 Personal history of transient ischemic attack (TIA), and cerebral infarction without residual deficits: Secondary | ICD-10-CM | POA: Diagnosis not present

## 2017-07-05 ENCOUNTER — Encounter: Payer: Medicare Other | Attending: Physical Medicine & Rehabilitation | Admitting: Physical Medicine & Rehabilitation

## 2017-07-05 ENCOUNTER — Encounter: Payer: Self-pay | Admitting: Physical Medicine & Rehabilitation

## 2017-07-05 VITALS — BP 117/78 | HR 67 | Resp 14 | Ht 71.0 in | Wt 193.0 lb

## 2017-07-05 DIAGNOSIS — S065X3S Traumatic subdural hemorrhage with loss of consciousness of 1 hour to 5 hours 59 minutes, sequela: Secondary | ICD-10-CM

## 2017-07-05 DIAGNOSIS — G40909 Epilepsy, unspecified, not intractable, without status epilepticus: Secondary | ICD-10-CM | POA: Insufficient documentation

## 2017-07-05 DIAGNOSIS — Z9889 Other specified postprocedural states: Secondary | ICD-10-CM | POA: Insufficient documentation

## 2017-07-05 DIAGNOSIS — Z8673 Personal history of transient ischemic attack (TIA), and cerebral infarction without residual deficits: Secondary | ICD-10-CM | POA: Diagnosis not present

## 2017-07-05 DIAGNOSIS — R269 Unspecified abnormalities of gait and mobility: Secondary | ICD-10-CM | POA: Diagnosis not present

## 2017-07-05 DIAGNOSIS — E039 Hypothyroidism, unspecified: Secondary | ICD-10-CM | POA: Insufficient documentation

## 2017-07-05 DIAGNOSIS — R1310 Dysphagia, unspecified: Secondary | ICD-10-CM | POA: Insufficient documentation

## 2017-07-05 DIAGNOSIS — R569 Unspecified convulsions: Secondary | ICD-10-CM

## 2017-07-05 NOTE — Progress Notes (Signed)
Subjective:    Patient ID: Glen [name], male    DOB: 08-Mar-1970, 47 y.o.   MRN: [id number]  HPI   Glen [name] is here in follow up of his right subdural hematoma. He has completed HH therapies. He fell leaving our office a few weeks ago (he was standing by himself as his brother got the car). Otherwise he's been doing fairly well at home. Family keeps close by typically when he gets up. He can be a little impulsive at times.    No further seizures. He remains on vimpat on keppra for seizure prophylaxis. His appetite is excellent on a regular diet.  Hearing is an issue still. It seems to have steadily gotten worse over years per mother.        Pain Inventory Average Pain 0 Pain Right Now 0 My pain is no pain  In the last 24 hours, has pain interfered with the following? General activity 0 Relation with others 0 Enjoyment of life 0 What TIME of day is your pain at its worst? no pain Sleep (in general) Good  Pain is worse with: no pain Pain improves with: no pain Relief from Meds: no pain  Mobility walk with assistance use a walker ability to climb steps?  no do you drive?  no use a wheelchair needs help with transfers Do you have any goals in this area?  yes  Function disabled: date disabled May 1993 I need assistance with the following:  dressing  Neuro/Psych weakness confusion  Prior Studies Any changes since last visit?  no  Physicians involved in your care Any changes since last visit?  no   Family History  Adopted: Yes   Social History   Socioeconomic History  . Marital status: Married    Spouse name: Not on file  . Number of children: 1  . Years of education: college  . Highest education level: Not on file  Occupational History  . Occupation: Disabled  Social Needs  . Financial resource strain: Not on file  . Food insecurity:    Worry: Not on file    Inability: Not on file  . Transportation needs:    Medical: Not on file   Non-medical: Not on file  Tobacco Use  . Smoking status: Never Smoker  . Smokeless tobacco: Never Used  Substance and Sexual Activity  . Alcohol use: No    Frequency: Never  . Drug use: No  . Sexual activity: Not on file  Lifestyle  . Physical activity:    Days per week: Not on file    Minutes per session: Not on file  . Stress: Not on file  Relationships  . Social connections:    Talks on phone: Not on file    Gets together: Not on file    Attends religious service: Not on file    Active member of club or organization: Not on file    Attends meetings of clubs or organizations: Not on file    Relationship status: Not on file  Other Topics Concern  . Not on file  Social History Narrative   Lives at home with parents.   Right-handed.   Occasional caffeine.   Past Surgical History:  Procedure Laterality Date  . APPENDECTOMY     1980s  . BRAIN SURGERY     1993  . CRANIOTOMY Right 04/09/2017   Procedure: CRANIOTOMY HEMATOMA EVACUATION SUBDURAL;  Surgeon: Erline Levine, MD;  Location: [place];  Service: Neurosurgery;  Laterality: Right;  .  DIRECT LARYNGOSCOPY N/A 04/09/2017   Procedure: DIRECT LARYNGOSCOPY;  Surgeon: Erline Levine, MD;  Location: [place];  Service: Neurosurgery;  Laterality: N/A;  . DIRECT LARYNGOSCOPY N/A 04/09/2017   Procedure: DIRECT LARYNGOSCOPY;  Surgeon: [name], MD;  Location: [place];  Service: ENT;  Laterality: N/A;  . DIRECT LARYNGOSCOPY N/A 04/18/2017   Procedure: DIRECT LARYNGOSCOPY;  Surgeon: [name], MD;  Location: [place];  Service: ENT;  Laterality: N/A;  . TRACHEOSTOMY TUBE PLACEMENT N/A 04/18/2017   Procedure: TRANSNASAL FIBER OPTIC LARYNGOSCOPY;  Surgeon: [name], MD;  Location: [place];  Service: ENT;  Laterality: N/A;   Past Medical History:  Diagnosis Date  . Brain cancer ([place])   . Hypercholesteremia   . Seizure ([place])   . Stroke ([place])   . Thyroid disorder    BP 117/78 (BP Location: Right Arm, Patient Position: Sitting, Cuff Size:  Normal)   Pulse 67   Resp 14   Ht 5\' 11"  (1.803 m)   Wt 193 lb (87.5 kg)   SpO2 95%   BMI 26.92 kg/m   Opioid Risk Score:   Fall Risk Score:  `1  Depression screen PHQ 2/9  Depression screen PHQ 2/9 05/24/2017  Decreased Interest 0  Down, Depressed, Hopeless 0  PHQ - 2 Score 0  Altered sleeping 0  Tired, decreased energy 1  Change in appetite 0  Feeling bad or failure about yourself  0  Trouble concentrating 1  Moving slowly or fidgety/restless 0  Suicidal thoughts 0  PHQ-9 Score 2  Difficult doing work/chores Not difficult at all    Review of Systems  Constitutional: Negative.   HENT: Negative.   Eyes: Negative.   Respiratory: Negative.   Cardiovascular: Negative.   Gastrointestinal: Negative.   Endocrine: Negative.   Genitourinary: Positive for difficulty urinating.  Musculoskeletal: Positive for gait problem.  Skin: Negative.   Allergic/Immunologic: Negative.   Neurological: Positive for weakness.  Psychiatric/Behavioral: Positive for confusion.       Objective:   Physical Exam General: No acute distress. Well dressed HEENT: EOMI, oral membranes moist Cards: reg rate  Chest: normal effort Abdomen: Soft, NT, ND Skin: dry, intact Extremities: no edema    Neurological. HOH left greater than right. Processing delays. Impulsive. Strength 5/5 decreased standing balance, truncal ataxia. Shuffling gait Skin. craniotomy  Psych: Patient is flat but pleasant         Assessment & Plan:  1.Decreased functional mobilitysecondary to right subdural hematoma after fall complicated by acute hypoxic respiratory failure and stridor as well as history of glioblastoma and seizure disorder -refer him to outpatient therapies at some point probably at [facility]              -still a high fall risk with walker given balance and impulsivity. 2.  Pain Management:Tylenol as needed 3. Mood:Prozac 20 mg daily Seizure disorder. Vimpat 200 mg  twice daily, Keppra 1500 mg twice daily. Follow-up neurology services Dr. Krista Blue   No recurrent seizures noted 4.Dysphagia.  Patient doing well with nearly regular diet.  Mom shops up some of his food and has good awareness in this regard.   5.Hypothyroidism. Synthroid 6. Hearing Loss: made audiology referral for [facility] in [place]    Follow-up with me in about 3 months.  30 minutes of direct patient care time was spent in the office today.

## 2017-07-05 NOTE — Patient Instructions (Signed)
PLEASE FEEL FREE TO CALL OUR OFFICE WITH ANY PROBLEMS OR QUESTIONS ([phone])      

## 2017-07-06 ENCOUNTER — Other Ambulatory Visit: Payer: Self-pay | Admitting: Neurosurgery

## 2017-07-06 DIAGNOSIS — S065X2S Traumatic subdural hemorrhage with loss of consciousness of 31 minutes to 59 minutes, sequela: Secondary | ICD-10-CM

## 2017-07-11 DIAGNOSIS — E039 Hypothyroidism, unspecified: Secondary | ICD-10-CM | POA: Diagnosis not present

## 2017-07-11 DIAGNOSIS — E785 Hyperlipidemia, unspecified: Secondary | ICD-10-CM | POA: Diagnosis not present

## 2017-07-11 DIAGNOSIS — S065X0D Traumatic subdural hemorrhage without loss of consciousness, subsequent encounter: Secondary | ICD-10-CM | POA: Diagnosis not present

## 2017-07-11 DIAGNOSIS — G40909 Epilepsy, unspecified, not intractable, without status epilepticus: Secondary | ICD-10-CM | POA: Diagnosis not present

## 2017-07-11 DIAGNOSIS — Z8673 Personal history of transient ischemic attack (TIA), and cerebral infarction without residual deficits: Secondary | ICD-10-CM | POA: Diagnosis not present

## 2017-07-11 DIAGNOSIS — R1310 Dysphagia, unspecified: Secondary | ICD-10-CM | POA: Diagnosis not present

## 2017-07-12 ENCOUNTER — Telehealth: Payer: Self-pay

## 2017-07-12 DIAGNOSIS — G40909 Epilepsy, unspecified, not intractable, without status epilepticus: Secondary | ICD-10-CM | POA: Diagnosis not present

## 2017-07-12 DIAGNOSIS — S065X3S Traumatic subdural hemorrhage with loss of consciousness of 1 hour to 5 hours 59 minutes, sequela: Secondary | ICD-10-CM

## 2017-07-12 DIAGNOSIS — S065X0D Traumatic subdural hemorrhage without loss of consciousness, subsequent encounter: Secondary | ICD-10-CM | POA: Diagnosis not present

## 2017-07-12 DIAGNOSIS — R1310 Dysphagia, unspecified: Secondary | ICD-10-CM | POA: Diagnosis not present

## 2017-07-12 DIAGNOSIS — E785 Hyperlipidemia, unspecified: Secondary | ICD-10-CM | POA: Diagnosis not present

## 2017-07-12 DIAGNOSIS — E039 Hypothyroidism, unspecified: Secondary | ICD-10-CM | POA: Diagnosis not present

## 2017-07-12 DIAGNOSIS — Z8673 Personal history of transient ischemic attack (TIA), and cerebral infarction without residual deficits: Secondary | ICD-10-CM | POA: Diagnosis not present

## 2017-07-12 NOTE — Telephone Encounter (Signed)
done

## 2017-07-12 NOTE — Telephone Encounter (Signed)
Glen [name], PT/[facility] called stating pt is finishing PT and ST today, going to [facility] for PT but does also need ST. ST referral needs to go to [place] because [place] does not do ST.

## 2017-07-19 ENCOUNTER — Encounter: Payer: Self-pay | Admitting: Neurology

## 2017-07-19 ENCOUNTER — Ambulatory Visit (INDEPENDENT_AMBULATORY_CARE_PROVIDER_SITE_OTHER): Payer: Medicare Other | Admitting: Neurology

## 2017-07-19 VITALS — BP 121/76 | HR 82 | Ht 71.0 in | Wt 192.0 lb

## 2017-07-19 DIAGNOSIS — R278 Other lack of coordination: Secondary | ICD-10-CM | POA: Diagnosis not present

## 2017-07-19 DIAGNOSIS — R569 Unspecified convulsions: Secondary | ICD-10-CM | POA: Diagnosis not present

## 2017-07-19 DIAGNOSIS — I69990 Apraxia following unspecified cerebrovascular disease: Secondary | ICD-10-CM | POA: Diagnosis not present

## 2017-07-19 DIAGNOSIS — R269 Unspecified abnormalities of gait and mobility: Secondary | ICD-10-CM | POA: Diagnosis not present

## 2017-07-19 DIAGNOSIS — S065X3D Traumatic subdural hemorrhage with loss of consciousness of 1 hour to 5 hours 59 minutes, subsequent encounter: Secondary | ICD-10-CM | POA: Diagnosis not present

## 2017-07-19 DIAGNOSIS — Z736 Limitation of activities due to disability: Secondary | ICD-10-CM | POA: Diagnosis not present

## 2017-07-19 MED ORDER — LEVETIRACETAM 100 MG/ML PO SOLN: 1500.0000 mg | Freq: Two times a day (BID) | ORAL | 11 refills | Status: DC

## 2017-07-19 NOTE — Progress Notes (Signed)
PATIENT: Glen [name] DOB: 01/14/1971  Chief Complaint  Patient presents with  . Seizures    He is here with his mother, Glen [name].  No seizure activity reported.  They would like to review his EEG and MRI results.     HISTORICAL  Glen [name] is a 47 year old male, accompanied by his mother Glen [name], seen in refer by his primary care PA [name], for evaluation of seizure, Initial evaluation was on February 13, 2017.  I reviewed and summarized the referring note, he has past medical history of hyperlipidemia, hypothyroidism, testosterone deficiency, carried a diagnosis of autoimmune encephalomyelitis,  He was adopted by his mother at 47 days of age, 25, was able to review [name] record, he developed double vision, was diagnosed with fourth ventricle medulloblastoma, had surgery by Dr. Yetta [name], VP shunt replacement, this was followed by radiation therapy, he also had left parafalcine meningioma resected in 2004.  He had his first seizure in 1990s, has been treated with levetiracetam 750 mg twice a day, in September 2015, he had recurrent seizure, fever, confusion, was diagnosed with autoimmune cerebritis, was treated with Solu-Medrol 1 g daily for 5 days, and plasma exchange,  Has regained significant recovery.   He had a brain biopsy as part of his workup for autoimmune cerebritis in 2015, which was consistent with gliosis/cerebritis, extensive autoimmune workup was negative,  He also had a history of thrombocytopenia,  He had recurrent seizures, consistent of eye blinking, mouth drooling,  He was admitted to ICU in November 2015 for hypercapnic respiratory failure, decreased level of arousal, required intubation, brain MRI with contrast was suggestive of recurrent cerebritis, EEG excluded seizure, LP showed no signs of active infection, he was empirically treated with plasmapheresis, and his level of arousal improved with the treatment, his hospital course was also  complicated by vent dependent, require tracheostomy, as well as C. 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, since his diagnosis of autoimmune cerebritis, he has significant decline in his functional status, with rehabilitation, he was eventually able to ambulate with walker, still with very unsteady ataxic gait, loss of hearing, slurred speech, difficulty reading, he had regained significant recovery from 2016, was able to begin to read his Bible again  Since November 2018, he was noted to have recurrent spells of whining from his sleep, seems to be scared, and bothered by his nightmares, but only happens at nighttime, when his mother checked on him, he was able to tell her about some dreams, there was no seizure activity noted.  Laboratory evaluations in January 2019, LDL was 91, hemoglobin was 15.2, normal CMP creatinine of 1.1, TSH was mildly elevated 5.92, testosterone level was less than 10  CAT scan of the brain 2005 following a seizure activity and possible fall, advanced atrophy with centralized volume loss, mild dilatation of the ventricular system, left frontal approach ventriculostomy, catheter tip terminated within the third ventricle, there is a ill-defined area of high attenuation adjacent to the mid aspect of ventriculostomy, within the left frontal lobe, and left basal ganglion, which likely represent intraparenchymal hemorrhage,  UPDATE July 19 2017: EEG on April 20 2017 was mildly abnormal.  There is electrodiagnostic evidence of mild background slowing, indicating bi-hemisphere malfunction.  There is no evidence of epileptiform discharge.  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, mild parenchymal brain volume loss, left ventriculoperitoneal shunt without hydrocephalus, scattered chronic parenchymal hemorrhages,  He was switched from [place] to Depakote in February 2019 attempting to better control of his mood disorder, anxiety, but on March 13, 2017 who was admitted to the event of hospital, following increased confusion, falling episode,  CT head without contrast, mild diffuse cortical atrophy, old right posterior parietal infarction, stable left ventriculostomy catheter with distal tip in the third ventricle, postsurgical change  Chest x-ray, no acute disease,  EKG, left ventricular hypertrophy, nonspecific T wave abnormality, prolonged QT,  Laboratory evaluations, hemoglobin of 14.4, WBC of 7.0, normal CMP, creatinine of 0.9  He was switched to Keppra  1500 mg twice a day liquid form, now he is back to his baseline,  REVIEW OF SYSTEMS: Full 14 system review of systems performed and notable only for blurry vision, runny nose, memory loss, speech difficulty, weakness,  ALLERGIES: Allergies  Allergen Reactions  . Heparin Anaphylaxis    Patient has HITT  . Lidocaine Hives  . Depakote [Divalproex Sodium] Rash  . Phenytoin Sodium Extended Rash    HOME MEDICATIONS: Current Outpatient Medications  Medication Sig Dispense Refill  . atorvastatin (LIPITOR) 20 MG tablet Take 1 tablet (20 mg total) by mouth daily. 30 tablet 2  . FLUoxetine (PROZAC) 20 MG capsule Take 1 capsule (20 mg total) by mouth daily. 30 capsule 2  . lacosamide (VIMPAT) 200 MG TABS tablet Take 1 tablet (200 mg total) by mouth 2 (two) times daily. 60 tablet 3  . levETIRAcetam (KEPPRA) 100 MG/ML solution Take 15 mLs (1,500 mg total) by mouth 2 (two) times daily. 473 mL 12  . levothyroxine (SYNTHROID, LEVOTHROID) 50 MCG tablet Take 1 tablet (50 mcg total) by mouth daily before breakfast. 90 tablet 3  . loratadine (CLARITIN) 10 MG tablet Take 10  mg by mouth daily.  1  . polyethylene glycol (MIRALAX / GLYCOLAX) packet Take 17 g by mouth daily. 14 each 0   No current facility-administered medications for this visit.     PAST MEDICAL HISTORY: Past Medical History:  Diagnosis Date  . Brain cancer ([place])   . Hypercholesteremia   . Seizure ([place])   . Stroke ([place])   . Thyroid disorder     PAST SURGICAL HISTORY: Past Surgical History:  Procedure Laterality Date  . APPENDECTOMY     1980s  . BRAIN SURGERY     1993  . CRANIOTOMY Right 04/09/2017   Procedure: CRANIOTOMY HEMATOMA EVACUATION SUBDURAL;  Surgeon: Erline Levine, MD;  Location: [facility];  Service: Neurosurgery;  Laterality: Right;  . DIRECT LARYNGOSCOPY N/A 04/09/2017   Procedure: DIRECT LARYNGOSCOPY;  Surgeon: Erline Levine, MD;  Location: [facility];  Service: Neurosurgery;  Laterality: N/A;  . DIRECT LARYNGOSCOPY N/A 04/09/2017   Procedure: DIRECT LARYNGOSCOPY;  Surgeon: [name], MD;  Location: [facility];  Service: ENT;  Laterality: N/A;  . DIRECT LARYNGOSCOPY N/A 04/18/2017   Procedure: DIRECT LARYNGOSCOPY;  Surgeon: [name], MD;  Location: [facility];  Service: ENT;  Laterality: N/A;  . TRACHEOSTOMY TUBE PLACEMENT N/A 04/18/2017   Procedure: TRANSNASAL FIBER OPTIC LARYNGOSCOPY;  Surgeon: [name], MD;  Location: [facility];  Service: ENT;  Laterality: N/A;    FAMILY HISTORY: Family History  Adopted: Yes    SOCIAL HISTORY:  Social History   Socioeconomic History  . Marital status: Married  Spouse name: Not on file  . Number of children: 1  . Years of education: college  . Highest education level: Not on file  Occupational History  . Occupation: Disabled  Social Needs  . Financial resource strain: Not on file  . Food insecurity:    Worry: Not on file    Inability: Not on file  . Transportation needs:    Medical: Not on file    Non-medical: Not on file  Tobacco Use  . Smoking status: Never Smoker  . Smokeless tobacco: Never Used  Substance and Sexual  Activity  . Alcohol use: No    Frequency: Never  . Drug use: No  . Sexual activity: Not on file  Lifestyle  . Physical activity:    Days per week: Not on file    Minutes per session: Not on file  . Stress: Not on file  Relationships  . Social connections:    Talks on phone: Not on file    Gets together: Not on file    Attends religious service: Not on file    Active member of club or organization: Not on file    Attends meetings of clubs or organizations: Not on file    Relationship status: Not on file  . Intimate partner violence:    Fear of current or ex partner: Not on file    Emotionally abused: Not on file    Physically abused: Not on file    Forced sexual activity: Not on file  Other Topics Concern  . Not on file  Social History Narrative   Lives at home with parents.   Right-handed.   Occasional caffeine.     PHYSICAL EXAM   Vitals:   07/19/17 1057  BP: 121/76  Pulse: 82  Weight: 192 lb (87.1 kg)  Height: 5\' 11"  (1.803 m)    Not recorded      Body mass index is 26.78 kg/m.  PHYSICAL EXAMNIATION:  Gen: NAD, conversant, well nourised, obese, well groomed                     Cardiovascular: Regular rate rhythm, no peripheral edema, warm, nontender. Eyes: Conjunctivae clear without exudates or hemorrhage Neck: Supple, no carotid bruits. Pulmonary: Clear to auscultation bilaterally   NEUROLOGICAL EXAM:  MENTAL STATUS: Speech cognition: slurred speech, rely on his mother to provide history, Hard of hearing, difficulty following one-step commands, with left neglect CRANIAL NERVES: CN II: Pupils are equal round reactive to light,. CN III, IV, VI: Smooth pursuit was broken into small catch-up saccade movement, end gaze horizontal rotatory nystagmus CN V: Facial sensation is intact to pinprick in all 3 divisions bilaterally. Corneal responses are intact.  CN VII: Face is symmetric with normal eye closure and smile. CN VIII: Hearing is normal to rubbing  fingers CN IX, X: Palate elevates symmetrically. Phonation is normal. CN XI: Head turning and shoulder shrug are intact CN XII: Tongue is midline with normal movements and no atrophy.  MOTOR: Normal muscle tone, strength,  REFLEXES: Reflexes are hyperactive and symmetric at the biceps, triceps, knees, and ankles. Plantar responses are flexor.  SENSORY: Intact to light touch, pinprick,  COORDINATION: He has trunk ataxia, mild to moderate finger to nose ataxia  GAIT/STANCE: He needs pushed up to get up from seated position, wide-based, cautious gait very unsteady,  DIAGNOSTIC DATA (LABS, IMAGING, TESTING) - I reviewed patient records, labs, notes, testing and imaging myself where available.   ASSESSMENT AND PLAN  SKILER OLDEN is a 47 y.o. male   History of brainstem medulloblastoma, status post suboccipital craniotomy followed by radiation therapy in 1993, Parafalcine meningioma status post resection in 2004 History of autoimmune encephalitis Complex partial seizure  Keep current dose of Keppra 1500 mg twice a day  Increased confusion after switch to Depakote   Marcial Pacas, M.D. Ph.D.  [facility] [address] [place], [place] [address] Ph: [phone] Fax: [phone]  CC: [name], PA-C

## 2017-07-26 ENCOUNTER — Ambulatory Visit
Admission: RE | Admit: 2017-07-26 | Discharge: 2017-07-26 | Disposition: A | Discharge status: Home / Self Care | Payer: Medicare Other | Source: Ambulatory Visit | Attending: Neurosurgery | Admitting: Neurosurgery

## 2017-07-26 DIAGNOSIS — I6200 Nontraumatic subdural hemorrhage, unspecified: Secondary | ICD-10-CM | POA: Diagnosis not present

## 2017-07-26 DIAGNOSIS — S065X2S Traumatic subdural hemorrhage with loss of consciousness of 31 minutes to 59 minutes, sequela: Secondary | ICD-10-CM

## 2017-07-27 DIAGNOSIS — S065X3D Traumatic subdural hemorrhage with loss of consciousness of 1 hour to 5 hours 59 minutes, subsequent encounter: Secondary | ICD-10-CM | POA: Diagnosis not present

## 2017-07-27 DIAGNOSIS — R269 Unspecified abnormalities of gait and mobility: Secondary | ICD-10-CM | POA: Diagnosis not present

## 2017-07-27 DIAGNOSIS — R569 Unspecified convulsions: Secondary | ICD-10-CM | POA: Diagnosis not present

## 2017-07-27 DIAGNOSIS — Z736 Limitation of activities due to disability: Secondary | ICD-10-CM | POA: Diagnosis not present

## 2017-07-27 DIAGNOSIS — R278 Other lack of coordination: Secondary | ICD-10-CM | POA: Diagnosis not present

## 2017-07-27 DIAGNOSIS — I69990 Apraxia following unspecified cerebrovascular disease: Secondary | ICD-10-CM | POA: Diagnosis not present

## 2017-08-01 DIAGNOSIS — R278 Other lack of coordination: Secondary | ICD-10-CM | POA: Diagnosis not present

## 2017-08-01 DIAGNOSIS — I69990 Apraxia following unspecified cerebrovascular disease: Secondary | ICD-10-CM | POA: Diagnosis not present

## 2017-08-01 DIAGNOSIS — R569 Unspecified convulsions: Secondary | ICD-10-CM | POA: Diagnosis not present

## 2017-08-01 DIAGNOSIS — Z736 Limitation of activities due to disability: Secondary | ICD-10-CM | POA: Diagnosis not present

## 2017-08-01 DIAGNOSIS — S065X3D Traumatic subdural hemorrhage with loss of consciousness of 1 hour to 5 hours 59 minutes, subsequent encounter: Secondary | ICD-10-CM | POA: Diagnosis not present

## 2017-08-01 DIAGNOSIS — R269 Unspecified abnormalities of gait and mobility: Secondary | ICD-10-CM | POA: Diagnosis not present

## 2017-08-03 DIAGNOSIS — R278 Other lack of coordination: Secondary | ICD-10-CM | POA: Diagnosis not present

## 2017-08-03 DIAGNOSIS — Z736 Limitation of activities due to disability: Secondary | ICD-10-CM | POA: Diagnosis not present

## 2017-08-03 DIAGNOSIS — S065X3D Traumatic subdural hemorrhage with loss of consciousness of 1 hour to 5 hours 59 minutes, subsequent encounter: Secondary | ICD-10-CM | POA: Diagnosis not present

## 2017-08-03 DIAGNOSIS — R569 Unspecified convulsions: Secondary | ICD-10-CM | POA: Diagnosis not present

## 2017-08-03 DIAGNOSIS — I69990 Apraxia following unspecified cerebrovascular disease: Secondary | ICD-10-CM | POA: Diagnosis not present

## 2017-08-03 DIAGNOSIS — R269 Unspecified abnormalities of gait and mobility: Secondary | ICD-10-CM | POA: Diagnosis not present

## 2017-08-08 DIAGNOSIS — Z736 Limitation of activities due to disability: Secondary | ICD-10-CM | POA: Diagnosis not present

## 2017-08-08 DIAGNOSIS — S065X3D Traumatic subdural hemorrhage with loss of consciousness of 1 hour to 5 hours 59 minutes, subsequent encounter: Secondary | ICD-10-CM | POA: Diagnosis not present

## 2017-08-08 DIAGNOSIS — R278 Other lack of coordination: Secondary | ICD-10-CM | POA: Diagnosis not present

## 2017-08-08 DIAGNOSIS — R569 Unspecified convulsions: Secondary | ICD-10-CM | POA: Diagnosis not present

## 2017-08-08 DIAGNOSIS — R269 Unspecified abnormalities of gait and mobility: Secondary | ICD-10-CM | POA: Diagnosis not present

## 2017-08-08 DIAGNOSIS — I69990 Apraxia following unspecified cerebrovascular disease: Secondary | ICD-10-CM | POA: Diagnosis not present

## 2017-08-10 DIAGNOSIS — I69990 Apraxia following unspecified cerebrovascular disease: Secondary | ICD-10-CM | POA: Diagnosis not present

## 2017-08-10 DIAGNOSIS — R269 Unspecified abnormalities of gait and mobility: Secondary | ICD-10-CM | POA: Diagnosis not present

## 2017-08-10 DIAGNOSIS — S065X3D Traumatic subdural hemorrhage with loss of consciousness of 1 hour to 5 hours 59 minutes, subsequent encounter: Secondary | ICD-10-CM | POA: Diagnosis not present

## 2017-08-10 DIAGNOSIS — R569 Unspecified convulsions: Secondary | ICD-10-CM | POA: Diagnosis not present

## 2017-08-10 DIAGNOSIS — Z736 Limitation of activities due to disability: Secondary | ICD-10-CM | POA: Diagnosis not present

## 2017-08-10 DIAGNOSIS — R278 Other lack of coordination: Secondary | ICD-10-CM | POA: Diagnosis not present

## 2017-08-15 DIAGNOSIS — Z736 Limitation of activities due to disability: Secondary | ICD-10-CM | POA: Diagnosis not present

## 2017-08-15 DIAGNOSIS — I69990 Apraxia following unspecified cerebrovascular disease: Secondary | ICD-10-CM | POA: Diagnosis not present

## 2017-08-15 DIAGNOSIS — R269 Unspecified abnormalities of gait and mobility: Secondary | ICD-10-CM | POA: Diagnosis not present

## 2017-08-15 DIAGNOSIS — S065X3D Traumatic subdural hemorrhage with loss of consciousness of 1 hour to 5 hours 59 minutes, subsequent encounter: Secondary | ICD-10-CM | POA: Diagnosis not present

## 2017-08-15 DIAGNOSIS — R278 Other lack of coordination: Secondary | ICD-10-CM | POA: Diagnosis not present

## 2017-08-15 DIAGNOSIS — R569 Unspecified convulsions: Secondary | ICD-10-CM | POA: Diagnosis not present

## 2017-08-16 DIAGNOSIS — H469 Unspecified optic neuritis: Secondary | ICD-10-CM | POA: Diagnosis not present

## 2017-08-16 DIAGNOSIS — H524 Presbyopia: Secondary | ICD-10-CM | POA: Diagnosis not present

## 2017-08-16 DIAGNOSIS — D320 Benign neoplasm of cerebral meninges: Secondary | ICD-10-CM | POA: Diagnosis not present

## 2017-08-17 DIAGNOSIS — Z736 Limitation of activities due to disability: Secondary | ICD-10-CM | POA: Diagnosis not present

## 2017-08-17 DIAGNOSIS — R569 Unspecified convulsions: Secondary | ICD-10-CM | POA: Diagnosis not present

## 2017-08-17 DIAGNOSIS — R482 Apraxia: Secondary | ICD-10-CM | POA: Diagnosis not present

## 2017-08-17 DIAGNOSIS — R278 Other lack of coordination: Secondary | ICD-10-CM | POA: Diagnosis not present

## 2017-08-17 DIAGNOSIS — R269 Unspecified abnormalities of gait and mobility: Secondary | ICD-10-CM | POA: Diagnosis not present

## 2017-08-17 DIAGNOSIS — S065X3S Traumatic subdural hemorrhage with loss of consciousness of 1 hour to 5 hours 59 minutes, sequela: Secondary | ICD-10-CM | POA: Diagnosis not present

## 2017-08-22 DIAGNOSIS — R269 Unspecified abnormalities of gait and mobility: Secondary | ICD-10-CM | POA: Diagnosis not present

## 2017-08-22 DIAGNOSIS — R482 Apraxia: Secondary | ICD-10-CM | POA: Diagnosis not present

## 2017-08-22 DIAGNOSIS — R569 Unspecified convulsions: Secondary | ICD-10-CM | POA: Diagnosis not present

## 2017-08-22 DIAGNOSIS — Z736 Limitation of activities due to disability: Secondary | ICD-10-CM | POA: Diagnosis not present

## 2017-08-22 DIAGNOSIS — R278 Other lack of coordination: Secondary | ICD-10-CM | POA: Diagnosis not present

## 2017-08-22 DIAGNOSIS — S065X3S Traumatic subdural hemorrhage with loss of consciousness of 1 hour to 5 hours 59 minutes, sequela: Secondary | ICD-10-CM | POA: Diagnosis not present

## 2017-08-24 DIAGNOSIS — S065X3S Traumatic subdural hemorrhage with loss of consciousness of 1 hour to 5 hours 59 minutes, sequela: Secondary | ICD-10-CM | POA: Diagnosis not present

## 2017-08-24 DIAGNOSIS — Z736 Limitation of activities due to disability: Secondary | ICD-10-CM | POA: Diagnosis not present

## 2017-08-24 DIAGNOSIS — R482 Apraxia: Secondary | ICD-10-CM | POA: Diagnosis not present

## 2017-08-24 DIAGNOSIS — R569 Unspecified convulsions: Secondary | ICD-10-CM | POA: Diagnosis not present

## 2017-08-24 DIAGNOSIS — R278 Other lack of coordination: Secondary | ICD-10-CM | POA: Diagnosis not present

## 2017-08-24 DIAGNOSIS — R269 Unspecified abnormalities of gait and mobility: Secondary | ICD-10-CM | POA: Diagnosis not present

## 2017-08-28 DIAGNOSIS — E291 Testicular hypofunction: Secondary | ICD-10-CM | POA: Diagnosis not present

## 2017-08-28 DIAGNOSIS — G0481 Other encephalitis and encephalomyelitis: Secondary | ICD-10-CM | POA: Diagnosis not present

## 2017-08-28 DIAGNOSIS — E034 Atrophy of thyroid (acquired): Secondary | ICD-10-CM | POA: Diagnosis not present

## 2017-08-28 DIAGNOSIS — G4089 Other seizures: Secondary | ICD-10-CM | POA: Diagnosis not present

## 2017-08-28 DIAGNOSIS — E782 Mixed hyperlipidemia: Secondary | ICD-10-CM | POA: Diagnosis not present

## 2017-08-29 DIAGNOSIS — R269 Unspecified abnormalities of gait and mobility: Secondary | ICD-10-CM | POA: Diagnosis not present

## 2017-08-29 DIAGNOSIS — R278 Other lack of coordination: Secondary | ICD-10-CM | POA: Diagnosis not present

## 2017-08-29 DIAGNOSIS — R482 Apraxia: Secondary | ICD-10-CM | POA: Diagnosis not present

## 2017-08-29 DIAGNOSIS — S065X3S Traumatic subdural hemorrhage with loss of consciousness of 1 hour to 5 hours 59 minutes, sequela: Secondary | ICD-10-CM | POA: Diagnosis not present

## 2017-08-29 DIAGNOSIS — Z736 Limitation of activities due to disability: Secondary | ICD-10-CM | POA: Diagnosis not present

## 2017-08-29 DIAGNOSIS — R569 Unspecified convulsions: Secondary | ICD-10-CM | POA: Diagnosis not present

## 2017-08-31 DIAGNOSIS — R569 Unspecified convulsions: Secondary | ICD-10-CM | POA: Diagnosis not present

## 2017-08-31 DIAGNOSIS — R278 Other lack of coordination: Secondary | ICD-10-CM | POA: Diagnosis not present

## 2017-08-31 DIAGNOSIS — R269 Unspecified abnormalities of gait and mobility: Secondary | ICD-10-CM | POA: Diagnosis not present

## 2017-08-31 DIAGNOSIS — S065X3S Traumatic subdural hemorrhage with loss of consciousness of 1 hour to 5 hours 59 minutes, sequela: Secondary | ICD-10-CM | POA: Diagnosis not present

## 2017-08-31 DIAGNOSIS — R482 Apraxia: Secondary | ICD-10-CM | POA: Diagnosis not present

## 2017-08-31 DIAGNOSIS — Z736 Limitation of activities due to disability: Secondary | ICD-10-CM | POA: Diagnosis not present

## 2017-09-05 DIAGNOSIS — Z736 Limitation of activities due to disability: Secondary | ICD-10-CM | POA: Diagnosis not present

## 2017-09-05 DIAGNOSIS — R482 Apraxia: Secondary | ICD-10-CM | POA: Diagnosis not present

## 2017-09-05 DIAGNOSIS — S065X3S Traumatic subdural hemorrhage with loss of consciousness of 1 hour to 5 hours 59 minutes, sequela: Secondary | ICD-10-CM | POA: Diagnosis not present

## 2017-09-05 DIAGNOSIS — R269 Unspecified abnormalities of gait and mobility: Secondary | ICD-10-CM | POA: Diagnosis not present

## 2017-09-05 DIAGNOSIS — R569 Unspecified convulsions: Secondary | ICD-10-CM | POA: Diagnosis not present

## 2017-09-05 DIAGNOSIS — R278 Other lack of coordination: Secondary | ICD-10-CM | POA: Diagnosis not present

## 2017-09-07 DIAGNOSIS — R482 Apraxia: Secondary | ICD-10-CM | POA: Diagnosis not present

## 2017-09-07 DIAGNOSIS — Z736 Limitation of activities due to disability: Secondary | ICD-10-CM | POA: Diagnosis not present

## 2017-09-07 DIAGNOSIS — R569 Unspecified convulsions: Secondary | ICD-10-CM | POA: Diagnosis not present

## 2017-09-07 DIAGNOSIS — R278 Other lack of coordination: Secondary | ICD-10-CM | POA: Diagnosis not present

## 2017-09-07 DIAGNOSIS — S065X3S Traumatic subdural hemorrhage with loss of consciousness of 1 hour to 5 hours 59 minutes, sequela: Secondary | ICD-10-CM | POA: Diagnosis not present

## 2017-09-07 DIAGNOSIS — R269 Unspecified abnormalities of gait and mobility: Secondary | ICD-10-CM | POA: Diagnosis not present

## 2017-09-12 DIAGNOSIS — R569 Unspecified convulsions: Secondary | ICD-10-CM | POA: Diagnosis not present

## 2017-09-12 DIAGNOSIS — R482 Apraxia: Secondary | ICD-10-CM | POA: Diagnosis not present

## 2017-09-12 DIAGNOSIS — R278 Other lack of coordination: Secondary | ICD-10-CM | POA: Diagnosis not present

## 2017-09-12 DIAGNOSIS — S065X3S Traumatic subdural hemorrhage with loss of consciousness of 1 hour to 5 hours 59 minutes, sequela: Secondary | ICD-10-CM | POA: Diagnosis not present

## 2017-09-12 DIAGNOSIS — R269 Unspecified abnormalities of gait and mobility: Secondary | ICD-10-CM | POA: Diagnosis not present

## 2017-09-12 DIAGNOSIS — Z736 Limitation of activities due to disability: Secondary | ICD-10-CM | POA: Diagnosis not present

## 2017-09-14 DIAGNOSIS — R278 Other lack of coordination: Secondary | ICD-10-CM | POA: Diagnosis not present

## 2017-09-14 DIAGNOSIS — S065X3S Traumatic subdural hemorrhage with loss of consciousness of 1 hour to 5 hours 59 minutes, sequela: Secondary | ICD-10-CM | POA: Diagnosis not present

## 2017-09-14 DIAGNOSIS — R269 Unspecified abnormalities of gait and mobility: Secondary | ICD-10-CM | POA: Diagnosis not present

## 2017-09-14 DIAGNOSIS — R482 Apraxia: Secondary | ICD-10-CM | POA: Diagnosis not present

## 2017-09-14 DIAGNOSIS — R569 Unspecified convulsions: Secondary | ICD-10-CM | POA: Diagnosis not present

## 2017-09-14 DIAGNOSIS — Z736 Limitation of activities due to disability: Secondary | ICD-10-CM | POA: Diagnosis not present

## 2017-09-18 DIAGNOSIS — K219 Gastro-esophageal reflux disease without esophagitis: Secondary | ICD-10-CM | POA: Diagnosis not present

## 2017-09-18 DIAGNOSIS — S0003XA Contusion of scalp, initial encounter: Secondary | ICD-10-CM | POA: Diagnosis not present

## 2017-09-18 DIAGNOSIS — E039 Hypothyroidism, unspecified: Secondary | ICD-10-CM | POA: Diagnosis not present

## 2017-09-18 DIAGNOSIS — S199XXA Unspecified injury of neck, initial encounter: Secondary | ICD-10-CM | POA: Diagnosis not present

## 2017-09-18 DIAGNOSIS — Z7901 Long term (current) use of anticoagulants: Secondary | ICD-10-CM | POA: Diagnosis not present

## 2017-09-18 DIAGNOSIS — I69991 Dysphagia following unspecified cerebrovascular disease: Secondary | ICD-10-CM | POA: Diagnosis not present

## 2017-09-18 DIAGNOSIS — I10 Essential (primary) hypertension: Secondary | ICD-10-CM | POA: Diagnosis not present

## 2017-09-18 DIAGNOSIS — R1310 Dysphagia, unspecified: Secondary | ICD-10-CM | POA: Diagnosis not present

## 2017-09-18 DIAGNOSIS — S0990XA Unspecified injury of head, initial encounter: Secondary | ICD-10-CM | POA: Diagnosis not present

## 2017-09-18 DIAGNOSIS — Z79899 Other long term (current) drug therapy: Secondary | ICD-10-CM | POA: Diagnosis not present

## 2017-09-18 DIAGNOSIS — H9190 Unspecified hearing loss, unspecified ear: Secondary | ICD-10-CM | POA: Diagnosis not present

## 2017-09-19 DIAGNOSIS — R482 Apraxia: Secondary | ICD-10-CM | POA: Diagnosis not present

## 2017-09-19 DIAGNOSIS — Z736 Limitation of activities due to disability: Secondary | ICD-10-CM | POA: Diagnosis not present

## 2017-09-19 DIAGNOSIS — R269 Unspecified abnormalities of gait and mobility: Secondary | ICD-10-CM | POA: Diagnosis not present

## 2017-09-19 DIAGNOSIS — R569 Unspecified convulsions: Secondary | ICD-10-CM | POA: Diagnosis not present

## 2017-09-19 DIAGNOSIS — S065X3D Traumatic subdural hemorrhage with loss of consciousness of 1 hour to 5 hours 59 minutes, subsequent encounter: Secondary | ICD-10-CM | POA: Diagnosis not present

## 2017-09-19 DIAGNOSIS — R278 Other lack of coordination: Secondary | ICD-10-CM | POA: Diagnosis not present

## 2017-09-21 DIAGNOSIS — R269 Unspecified abnormalities of gait and mobility: Secondary | ICD-10-CM | POA: Diagnosis not present

## 2017-09-21 DIAGNOSIS — R482 Apraxia: Secondary | ICD-10-CM | POA: Diagnosis not present

## 2017-09-21 DIAGNOSIS — R569 Unspecified convulsions: Secondary | ICD-10-CM | POA: Diagnosis not present

## 2017-09-21 DIAGNOSIS — R278 Other lack of coordination: Secondary | ICD-10-CM | POA: Diagnosis not present

## 2017-09-21 DIAGNOSIS — Z736 Limitation of activities due to disability: Secondary | ICD-10-CM | POA: Diagnosis not present

## 2017-09-21 DIAGNOSIS — S065X3D Traumatic subdural hemorrhage with loss of consciousness of 1 hour to 5 hours 59 minutes, subsequent encounter: Secondary | ICD-10-CM | POA: Diagnosis not present

## 2017-09-25 DIAGNOSIS — N390 Urinary tract infection, site not specified: Secondary | ICD-10-CM | POA: Diagnosis not present

## 2017-09-25 DIAGNOSIS — K8020 Calculus of gallbladder without cholecystitis without obstruction: Secondary | ICD-10-CM | POA: Diagnosis not present

## 2017-09-25 DIAGNOSIS — K828 Other specified diseases of gallbladder: Secondary | ICD-10-CM | POA: Diagnosis not present

## 2017-09-25 DIAGNOSIS — R05 Cough: Secondary | ICD-10-CM | POA: Diagnosis not present

## 2017-09-25 DIAGNOSIS — R509 Fever, unspecified: Secondary | ICD-10-CM | POA: Diagnosis not present

## 2017-09-25 DIAGNOSIS — B9689 Other specified bacterial agents as the cause of diseases classified elsewhere: Secondary | ICD-10-CM | POA: Diagnosis not present

## 2017-10-03 DIAGNOSIS — S065X3D Traumatic subdural hemorrhage with loss of consciousness of 1 hour to 5 hours 59 minutes, subsequent encounter: Secondary | ICD-10-CM | POA: Diagnosis not present

## 2017-10-03 DIAGNOSIS — R482 Apraxia: Secondary | ICD-10-CM | POA: Diagnosis not present

## 2017-10-03 DIAGNOSIS — R278 Other lack of coordination: Secondary | ICD-10-CM | POA: Diagnosis not present

## 2017-10-03 DIAGNOSIS — R569 Unspecified convulsions: Secondary | ICD-10-CM | POA: Diagnosis not present

## 2017-10-03 DIAGNOSIS — K8010 Calculus of gallbladder with chronic cholecystitis without obstruction: Secondary | ICD-10-CM | POA: Diagnosis not present

## 2017-10-03 DIAGNOSIS — Z736 Limitation of activities due to disability: Secondary | ICD-10-CM | POA: Diagnosis not present

## 2017-10-03 DIAGNOSIS — R269 Unspecified abnormalities of gait and mobility: Secondary | ICD-10-CM | POA: Diagnosis not present

## 2017-10-05 DIAGNOSIS — R482 Apraxia: Secondary | ICD-10-CM | POA: Diagnosis not present

## 2017-10-05 DIAGNOSIS — Z736 Limitation of activities due to disability: Secondary | ICD-10-CM | POA: Diagnosis not present

## 2017-10-05 DIAGNOSIS — R278 Other lack of coordination: Secondary | ICD-10-CM | POA: Diagnosis not present

## 2017-10-05 DIAGNOSIS — R269 Unspecified abnormalities of gait and mobility: Secondary | ICD-10-CM | POA: Diagnosis not present

## 2017-10-05 DIAGNOSIS — S065X3D Traumatic subdural hemorrhage with loss of consciousness of 1 hour to 5 hours 59 minutes, subsequent encounter: Secondary | ICD-10-CM | POA: Diagnosis not present

## 2017-10-05 DIAGNOSIS — R569 Unspecified convulsions: Secondary | ICD-10-CM | POA: Diagnosis not present

## 2017-10-09 ENCOUNTER — Encounter: Payer: Self-pay | Admitting: Physical Medicine & Rehabilitation

## 2017-10-09 ENCOUNTER — Encounter: Payer: Medicare Other | Attending: Physical Medicine & Rehabilitation | Admitting: Physical Medicine & Rehabilitation

## 2017-10-09 VITALS — BP 123/82 | HR 88 | Resp 14 | Ht 71.0 in | Wt 192.0 lb

## 2017-10-09 DIAGNOSIS — Z9889 Other specified postprocedural states: Secondary | ICD-10-CM | POA: Diagnosis not present

## 2017-10-09 DIAGNOSIS — G40909 Epilepsy, unspecified, not intractable, without status epilepticus: Secondary | ICD-10-CM | POA: Insufficient documentation

## 2017-10-09 DIAGNOSIS — E039 Hypothyroidism, unspecified: Secondary | ICD-10-CM | POA: Diagnosis not present

## 2017-10-09 DIAGNOSIS — R269 Unspecified abnormalities of gait and mobility: Secondary | ICD-10-CM

## 2017-10-09 DIAGNOSIS — Z8673 Personal history of transient ischemic attack (TIA), and cerebral infarction without residual deficits: Secondary | ICD-10-CM | POA: Insufficient documentation

## 2017-10-09 DIAGNOSIS — S065X3S Traumatic subdural hemorrhage with loss of consciousness of 1 hour to 5 hours 59 minutes, sequela: Secondary | ICD-10-CM

## 2017-10-09 DIAGNOSIS — R569 Unspecified convulsions: Secondary | ICD-10-CM

## 2017-10-09 DIAGNOSIS — R1310 Dysphagia, unspecified: Secondary | ICD-10-CM | POA: Diagnosis not present

## 2017-10-09 NOTE — Progress Notes (Signed)
Subjective:    Patient ID: Glen [name], male    DOB: [birth year], 47 y.o.   MRN: [id number]  HPI Glen [name] is here in follow-up of his traumatic subdural hematoma and gait disorder.  He continues with therapies at outpatient therapy with [facility].  He has been doing better from a balance and gait standpoint.  Unfortunately this morning, he fell and lost his balance and scraped his right head on the carpet.  He does require still 24-hour supervision for safety.  Speech therapy has signed off.  Glen [name]'s cognition is close to baseline.  He remains interactive with family.  Mother states that he really likes to do puzzles.  He did develop cholecystitis and is scheduled for gallbladder removal next week.  Pain Inventory Average Pain 0 Pain Right Now 0 My pain is no pain  In the last 24 hours, has pain interfered with the following? General activity 0 Relation with others 0 Enjoyment of life 0 What TIME of day is your pain at its worst? no pain Sleep (in general) Good  Pain is worse with: no pain Pain improves with: no pain Relief from Meds: no pain  Mobility walk with assistance use a walker ability to climb steps?  no do you drive?  no use a wheelchair  Function disabled: date disabled . I need assistance with the following:  dressing and toileting  Neuro/Psych trouble walking confusion  Prior Studies Any changes since last visit?  no  Physicians involved in your care Any changes since last visit?  no   Family History  Adopted: Yes   Social History   Socioeconomic History  . Marital status: Married    Spouse name: Not on file  . Number of children: 1  . Years of education: college  . Highest education level: Not on file  Occupational History  . Occupation: Disabled  Social Needs  . Financial resource strain: Not on file  . Food insecurity:    Worry: Not on file    Inability: Not on file  . Transportation needs:    Medical: Not on file   Non-medical: Not on file  Tobacco Use  . Smoking status: Never Smoker  . Smokeless tobacco: Never Used  Substance and Sexual Activity  . Alcohol use: No    Frequency: Never  . Drug use: No  . Sexual activity: Not on file  Lifestyle  . Physical activity:    Days per week: Not on file    Minutes per session: Not on file  . Stress: Not on file  Relationships  . Social connections:    Talks on phone: Not on file    Gets together: Not on file    Attends religious service: Not on file    Active member of club or organization: Not on file    Attends meetings of clubs or organizations: Not on file    Relationship status: Not on file  Other Topics Concern  . Not on file  Social History Narrative   Lives at home with parents.   Right-handed.   Occasional caffeine.   Past Surgical History:  Procedure Laterality Date  . APPENDECTOMY     1980s  . BRAIN SURGERY     1993  . CRANIOTOMY Right 04/09/2017   Procedure: CRANIOTOMY HEMATOMA EVACUATION SUBDURAL;  Surgeon: Glen Levine, MD;  Location: [facility];  Service: Neurosurgery;  Laterality: Right;  . DIRECT LARYNGOSCOPY N/A 04/09/2017   Procedure: DIRECT LARYNGOSCOPY;  Surgeon: Glen Levine, MD;  Location: [facility];  Service: Neurosurgery;  Laterality: N/A;  . DIRECT LARYNGOSCOPY N/A 04/09/2017   Procedure: DIRECT LARYNGOSCOPY;  Surgeon: Glen Quitter, MD;  Location: [facility];  Service: ENT;  Laterality: N/A;  . DIRECT LARYNGOSCOPY N/A 04/18/2017   Procedure: DIRECT LARYNGOSCOPY;  Surgeon: Glen Quitter, MD;  Location: [facility];  Service: ENT;  Laterality: N/A;  . TRACHEOSTOMY TUBE PLACEMENT N/A 04/18/2017   Procedure: TRANSNASAL FIBER OPTIC LARYNGOSCOPY;  Surgeon: Glen Quitter, MD;  Location: [facility];  Service: ENT;  Laterality: N/A;   Past Medical History:  Diagnosis Date  . Brain cancer ([place])   . Hypercholesteremia   . Seizure ([place])   . Stroke ([place])   . Thyroid disorder    BP 123/82   Pulse 88   Resp 14   Ht 5\' 11"  (1.803 m)   Wt 192 lb  (87.1 kg)   SpO2 96%   BMI 26.78 kg/m   Opioid Risk Score:   Fall Risk Score:  `1  Depression screen PHQ 2/9  Depression screen PHQ 2/9 05/24/2017  Decreased Interest 0  Down, Depressed, Hopeless 0  PHQ - 2 Score 0  Altered sleeping 0  Tired, decreased energy 1  Change in appetite 0  Feeling bad or failure about yourself  0  Trouble concentrating 1  Moving slowly or fidgety/restless 0  Suicidal thoughts 0  PHQ-9 Score 2  Difficult doing work/chores Not difficult at all   \ Review of Systems  Constitutional: Negative.   HENT: Negative.   Eyes: Negative.   Respiratory: Negative.   Cardiovascular: Negative.   Gastrointestinal: Negative.   Endocrine: Negative.   Genitourinary: Negative.   Musculoskeletal: Positive for gait problem.  Skin: Negative.   Allergic/Immunologic: Negative.   Neurological: Positive for speech difficulty.  Psychiatric/Behavioral: Positive for confusion.  All other systems reviewed and are negative.      Objective:   Physical Exam  General: No acute distress HEENT: EOMI, oral membranes moist Cards: reg rate  Chest: normal effort Abdomen: Soft, NT, ND Skin: dry, intact Extremities: no edema Neurological. HOHleft greater than right. Processing delays. Impulsive. Strength 5/5 decreased standing balance, tends to lean slightly to the right.  Right ankle and knee can be unsteady but with cueing they are better.  He is a bit impulsive with his gait and with changing directions.  Still shuffles. Skin. craniotomy  Psych:Patient is flat but pleasant       Assessment & Plan:  1.Decreased functional mobilitysecondary to right subdural hematoma after fall complicated by acute hypoxic respiratory failure and stridor as well as history of glioblastoma and seizure disorder -continue outpatient therapies at some point probably at [facility] a high fall risk with walker given balance and  impulsivity.  -AFO for RLE might be helpful to better stabilize right knee and RLE  -Reviewed some options with patient and mother today in the office  -I asked that therapy at [facility] try some samples with him if they have them. 2. Pain Management:Tylenol as needed 3. Mood:Prozac 20 mg daily Seizure disorder. Vimpat 200 mg twice daily, Keppra 1500 mg twice daily. Follow-up neurology services Dr. Krista Blue  No recurrent seizures noted 4.Dysphagia: Doing well with current diet. 5.Hypothyroidism. Synthroid 6. Hearing Loss: hearing amplifier per SLP 7. Cholecystitis: GB surgery next week   Follow-up with me in about 3 months. 15 minutes of direct patient care time was spent in the office today.

## 2017-10-09 NOTE — Patient Instructions (Addendum)
Right AFO----ankle foot orthosis  Might help to stabilize his ankle and knee a bit more.   Worth giving a try in therapy to see if he benefits from this?? Does therapy have a demo they can try on him??

## 2017-10-10 DIAGNOSIS — S065X3D Traumatic subdural hemorrhage with loss of consciousness of 1 hour to 5 hours 59 minutes, subsequent encounter: Secondary | ICD-10-CM | POA: Diagnosis not present

## 2017-10-10 DIAGNOSIS — R269 Unspecified abnormalities of gait and mobility: Secondary | ICD-10-CM | POA: Diagnosis not present

## 2017-10-10 DIAGNOSIS — R569 Unspecified convulsions: Secondary | ICD-10-CM | POA: Diagnosis not present

## 2017-10-10 DIAGNOSIS — R482 Apraxia: Secondary | ICD-10-CM | POA: Diagnosis not present

## 2017-10-10 DIAGNOSIS — R278 Other lack of coordination: Secondary | ICD-10-CM | POA: Diagnosis not present

## 2017-10-10 DIAGNOSIS — Z736 Limitation of activities due to disability: Secondary | ICD-10-CM | POA: Diagnosis not present

## 2017-10-12 DIAGNOSIS — S065X3D Traumatic subdural hemorrhage with loss of consciousness of 1 hour to 5 hours 59 minutes, subsequent encounter: Secondary | ICD-10-CM | POA: Diagnosis not present

## 2017-10-12 DIAGNOSIS — R278 Other lack of coordination: Secondary | ICD-10-CM | POA: Diagnosis not present

## 2017-10-12 DIAGNOSIS — Z736 Limitation of activities due to disability: Secondary | ICD-10-CM | POA: Diagnosis not present

## 2017-10-12 DIAGNOSIS — R482 Apraxia: Secondary | ICD-10-CM | POA: Diagnosis not present

## 2017-10-12 DIAGNOSIS — R569 Unspecified convulsions: Secondary | ICD-10-CM | POA: Diagnosis not present

## 2017-10-12 DIAGNOSIS — R269 Unspecified abnormalities of gait and mobility: Secondary | ICD-10-CM | POA: Diagnosis not present

## 2017-10-16 DIAGNOSIS — E782 Mixed hyperlipidemia: Secondary | ICD-10-CM | POA: Diagnosis not present

## 2017-10-16 DIAGNOSIS — Z85828 Personal history of other malignant neoplasm of skin: Secondary | ICD-10-CM | POA: Diagnosis not present

## 2017-10-16 DIAGNOSIS — E039 Hypothyroidism, unspecified: Secondary | ICD-10-CM | POA: Diagnosis not present

## 2017-10-16 DIAGNOSIS — K915 Postcholecystectomy syndrome: Secondary | ICD-10-CM | POA: Diagnosis not present

## 2017-10-16 DIAGNOSIS — Z79899 Other long term (current) drug therapy: Secondary | ICD-10-CM | POA: Diagnosis not present

## 2017-10-16 DIAGNOSIS — K8010 Calculus of gallbladder with chronic cholecystitis without obstruction: Secondary | ICD-10-CM | POA: Diagnosis not present

## 2017-10-16 DIAGNOSIS — G40909 Epilepsy, unspecified, not intractable, without status epilepticus: Secondary | ICD-10-CM | POA: Diagnosis not present

## 2017-10-16 DIAGNOSIS — Z85841 Personal history of malignant neoplasm of brain: Secondary | ICD-10-CM | POA: Diagnosis not present

## 2017-10-16 DIAGNOSIS — Z8673 Personal history of transient ischemic attack (TIA), and cerebral infarction without residual deficits: Secondary | ICD-10-CM | POA: Diagnosis not present

## 2017-10-16 DIAGNOSIS — I10 Essential (primary) hypertension: Secondary | ICD-10-CM | POA: Diagnosis not present

## 2017-10-16 DIAGNOSIS — R569 Unspecified convulsions: Secondary | ICD-10-CM | POA: Diagnosis not present

## 2017-11-01 DIAGNOSIS — Z09 Encounter for follow-up examination after completed treatment for conditions other than malignant neoplasm: Secondary | ICD-10-CM | POA: Diagnosis not present

## 2017-11-01 DIAGNOSIS — Z23 Encounter for immunization: Secondary | ICD-10-CM | POA: Diagnosis not present

## 2017-11-23 DIAGNOSIS — R5383 Other fatigue: Secondary | ICD-10-CM | POA: Diagnosis not present

## 2017-11-23 DIAGNOSIS — R278 Other lack of coordination: Secondary | ICD-10-CM | POA: Diagnosis not present

## 2017-11-23 DIAGNOSIS — S065X3S Traumatic subdural hemorrhage with loss of consciousness of 1 hour to 5 hours 59 minutes, sequela: Secondary | ICD-10-CM | POA: Diagnosis not present

## 2017-11-23 DIAGNOSIS — M6281 Muscle weakness (generalized): Secondary | ICD-10-CM | POA: Diagnosis not present

## 2017-11-23 DIAGNOSIS — R2689 Other abnormalities of gait and mobility: Secondary | ICD-10-CM | POA: Diagnosis not present

## 2017-11-23 DIAGNOSIS — Z736 Limitation of activities due to disability: Secondary | ICD-10-CM | POA: Diagnosis not present

## 2017-11-23 DIAGNOSIS — R2681 Unsteadiness on feet: Secondary | ICD-10-CM | POA: Diagnosis not present

## 2017-12-01 DIAGNOSIS — R2689 Other abnormalities of gait and mobility: Secondary | ICD-10-CM | POA: Diagnosis not present

## 2017-12-01 DIAGNOSIS — R2681 Unsteadiness on feet: Secondary | ICD-10-CM | POA: Diagnosis not present

## 2017-12-01 DIAGNOSIS — Z736 Limitation of activities due to disability: Secondary | ICD-10-CM | POA: Diagnosis not present

## 2017-12-01 DIAGNOSIS — S065X3S Traumatic subdural hemorrhage with loss of consciousness of 1 hour to 5 hours 59 minutes, sequela: Secondary | ICD-10-CM | POA: Diagnosis not present

## 2017-12-01 DIAGNOSIS — R278 Other lack of coordination: Secondary | ICD-10-CM | POA: Diagnosis not present

## 2017-12-01 DIAGNOSIS — M6281 Muscle weakness (generalized): Secondary | ICD-10-CM | POA: Diagnosis not present

## 2017-12-06 DIAGNOSIS — E291 Testicular hypofunction: Secondary | ICD-10-CM | POA: Diagnosis not present

## 2017-12-06 DIAGNOSIS — G4089 Other seizures: Secondary | ICD-10-CM | POA: Diagnosis not present

## 2017-12-06 DIAGNOSIS — G0481 Other encephalitis and encephalomyelitis: Secondary | ICD-10-CM | POA: Diagnosis not present

## 2017-12-06 DIAGNOSIS — E034 Atrophy of thyroid (acquired): Secondary | ICD-10-CM | POA: Diagnosis not present

## 2017-12-06 DIAGNOSIS — E782 Mixed hyperlipidemia: Secondary | ICD-10-CM | POA: Diagnosis not present

## 2017-12-08 DIAGNOSIS — R2681 Unsteadiness on feet: Secondary | ICD-10-CM | POA: Diagnosis not present

## 2017-12-08 DIAGNOSIS — M6281 Muscle weakness (generalized): Secondary | ICD-10-CM | POA: Diagnosis not present

## 2017-12-08 DIAGNOSIS — Z736 Limitation of activities due to disability: Secondary | ICD-10-CM | POA: Diagnosis not present

## 2017-12-08 DIAGNOSIS — S065X3S Traumatic subdural hemorrhage with loss of consciousness of 1 hour to 5 hours 59 minutes, sequela: Secondary | ICD-10-CM | POA: Diagnosis not present

## 2017-12-08 DIAGNOSIS — R2689 Other abnormalities of gait and mobility: Secondary | ICD-10-CM | POA: Diagnosis not present

## 2017-12-08 DIAGNOSIS — R278 Other lack of coordination: Secondary | ICD-10-CM | POA: Diagnosis not present

## 2017-12-12 DIAGNOSIS — R278 Other lack of coordination: Secondary | ICD-10-CM | POA: Diagnosis not present

## 2017-12-12 DIAGNOSIS — R2689 Other abnormalities of gait and mobility: Secondary | ICD-10-CM | POA: Diagnosis not present

## 2017-12-12 DIAGNOSIS — R2681 Unsteadiness on feet: Secondary | ICD-10-CM | POA: Diagnosis not present

## 2017-12-12 DIAGNOSIS — M6281 Muscle weakness (generalized): Secondary | ICD-10-CM | POA: Diagnosis not present

## 2017-12-12 DIAGNOSIS — Z736 Limitation of activities due to disability: Secondary | ICD-10-CM | POA: Diagnosis not present

## 2017-12-12 DIAGNOSIS — S065X3S Traumatic subdural hemorrhage with loss of consciousness of 1 hour to 5 hours 59 minutes, sequela: Secondary | ICD-10-CM | POA: Diagnosis not present

## 2017-12-19 DIAGNOSIS — S065X3D Traumatic subdural hemorrhage with loss of consciousness of 1 hour to 5 hours 59 minutes, subsequent encounter: Secondary | ICD-10-CM | POA: Diagnosis not present

## 2017-12-19 DIAGNOSIS — R2689 Other abnormalities of gait and mobility: Secondary | ICD-10-CM | POA: Diagnosis not present

## 2017-12-19 DIAGNOSIS — R2681 Unsteadiness on feet: Secondary | ICD-10-CM | POA: Diagnosis not present

## 2017-12-19 DIAGNOSIS — R5383 Other fatigue: Secondary | ICD-10-CM | POA: Diagnosis not present

## 2017-12-19 DIAGNOSIS — M6281 Muscle weakness (generalized): Secondary | ICD-10-CM | POA: Diagnosis not present

## 2017-12-19 DIAGNOSIS — Z736 Limitation of activities due to disability: Secondary | ICD-10-CM | POA: Diagnosis not present

## 2017-12-19 DIAGNOSIS — R278 Other lack of coordination: Secondary | ICD-10-CM | POA: Diagnosis not present

## 2017-12-21 DIAGNOSIS — R2689 Other abnormalities of gait and mobility: Secondary | ICD-10-CM | POA: Diagnosis not present

## 2017-12-21 DIAGNOSIS — R278 Other lack of coordination: Secondary | ICD-10-CM | POA: Diagnosis not present

## 2017-12-21 DIAGNOSIS — M6281 Muscle weakness (generalized): Secondary | ICD-10-CM | POA: Diagnosis not present

## 2017-12-21 DIAGNOSIS — R2681 Unsteadiness on feet: Secondary | ICD-10-CM | POA: Diagnosis not present

## 2017-12-21 DIAGNOSIS — S065X3D Traumatic subdural hemorrhage with loss of consciousness of 1 hour to 5 hours 59 minutes, subsequent encounter: Secondary | ICD-10-CM | POA: Diagnosis not present

## 2017-12-21 DIAGNOSIS — Z736 Limitation of activities due to disability: Secondary | ICD-10-CM | POA: Diagnosis not present

## 2017-12-26 DIAGNOSIS — R278 Other lack of coordination: Secondary | ICD-10-CM | POA: Diagnosis not present

## 2017-12-26 DIAGNOSIS — Z736 Limitation of activities due to disability: Secondary | ICD-10-CM | POA: Diagnosis not present

## 2017-12-26 DIAGNOSIS — R2681 Unsteadiness on feet: Secondary | ICD-10-CM | POA: Diagnosis not present

## 2017-12-26 DIAGNOSIS — M6281 Muscle weakness (generalized): Secondary | ICD-10-CM | POA: Diagnosis not present

## 2017-12-26 DIAGNOSIS — R2689 Other abnormalities of gait and mobility: Secondary | ICD-10-CM | POA: Diagnosis not present

## 2017-12-26 DIAGNOSIS — S065X3D Traumatic subdural hemorrhage with loss of consciousness of 1 hour to 5 hours 59 minutes, subsequent encounter: Secondary | ICD-10-CM | POA: Diagnosis not present

## 2017-12-28 DIAGNOSIS — Z736 Limitation of activities due to disability: Secondary | ICD-10-CM | POA: Diagnosis not present

## 2017-12-28 DIAGNOSIS — R2689 Other abnormalities of gait and mobility: Secondary | ICD-10-CM | POA: Diagnosis not present

## 2017-12-28 DIAGNOSIS — M6281 Muscle weakness (generalized): Secondary | ICD-10-CM | POA: Diagnosis not present

## 2017-12-28 DIAGNOSIS — S065X3D Traumatic subdural hemorrhage with loss of consciousness of 1 hour to 5 hours 59 minutes, subsequent encounter: Secondary | ICD-10-CM | POA: Diagnosis not present

## 2017-12-28 DIAGNOSIS — R278 Other lack of coordination: Secondary | ICD-10-CM | POA: Diagnosis not present

## 2017-12-28 DIAGNOSIS — R2681 Unsteadiness on feet: Secondary | ICD-10-CM | POA: Diagnosis not present

## 2018-01-06 ENCOUNTER — Other Ambulatory Visit: Payer: Self-pay | Admitting: Registered Nurse

## 2018-01-08 NOTE — Telephone Encounter (Signed)
This refill came in under our NP Danella Sensing.  According to PMP Zella Ball refilled medication in August with 3 refills following original order by our PA Dan [place]. .  I see that she made the TC call but did not see the pt, and no order logged. Dr [name] did see x 3 since TC call. His note mentions the Vimpat each time, though to follow up with Dr Krista Blue neurology and she saw pt in July did not mention the Vimpat.  Both Dr [name] and Zella Ball are out of office for holidays but Mr Shipes has appt with [name] 01/15/18. I have refilled x 1 month and Dr [name] can address at the appt 01/15/18.

## 2018-01-11 DIAGNOSIS — R2689 Other abnormalities of gait and mobility: Secondary | ICD-10-CM | POA: Diagnosis not present

## 2018-01-11 DIAGNOSIS — R278 Other lack of coordination: Secondary | ICD-10-CM | POA: Diagnosis not present

## 2018-01-11 DIAGNOSIS — R2681 Unsteadiness on feet: Secondary | ICD-10-CM | POA: Diagnosis not present

## 2018-01-11 DIAGNOSIS — S065X3D Traumatic subdural hemorrhage with loss of consciousness of 1 hour to 5 hours 59 minutes, subsequent encounter: Secondary | ICD-10-CM | POA: Diagnosis not present

## 2018-01-11 DIAGNOSIS — Z736 Limitation of activities due to disability: Secondary | ICD-10-CM | POA: Diagnosis not present

## 2018-01-11 DIAGNOSIS — M6281 Muscle weakness (generalized): Secondary | ICD-10-CM | POA: Diagnosis not present

## 2018-01-15 ENCOUNTER — Encounter: Payer: Self-pay | Admitting: Physical Medicine & Rehabilitation

## 2018-01-15 ENCOUNTER — Encounter: Payer: Medicare Other | Attending: Physical Medicine & Rehabilitation | Admitting: Physical Medicine & Rehabilitation

## 2018-01-15 VITALS — BP 104/74 | HR 76

## 2018-01-15 DIAGNOSIS — R262 Difficulty in walking, not elsewhere classified: Secondary | ICD-10-CM | POA: Insufficient documentation

## 2018-01-15 DIAGNOSIS — R269 Unspecified abnormalities of gait and mobility: Secondary | ICD-10-CM

## 2018-01-15 DIAGNOSIS — R1310 Dysphagia, unspecified: Secondary | ICD-10-CM | POA: Insufficient documentation

## 2018-01-15 DIAGNOSIS — Z85841 Personal history of malignant neoplasm of brain: Secondary | ICD-10-CM | POA: Insufficient documentation

## 2018-01-15 DIAGNOSIS — Z8673 Personal history of transient ischemic attack (TIA), and cerebral infarction without residual deficits: Secondary | ICD-10-CM | POA: Insufficient documentation

## 2018-01-15 DIAGNOSIS — R569 Unspecified convulsions: Secondary | ICD-10-CM | POA: Diagnosis not present

## 2018-01-15 DIAGNOSIS — E7800 Pure hypercholesterolemia, unspecified: Secondary | ICD-10-CM | POA: Insufficient documentation

## 2018-01-15 DIAGNOSIS — S065X0D Traumatic subdural hemorrhage without loss of consciousness, subsequent encounter: Secondary | ICD-10-CM | POA: Insufficient documentation

## 2018-01-15 DIAGNOSIS — Z79899 Other long term (current) drug therapy: Secondary | ICD-10-CM | POA: Insufficient documentation

## 2018-01-15 DIAGNOSIS — F419 Anxiety disorder, unspecified: Secondary | ICD-10-CM | POA: Insufficient documentation

## 2018-01-15 DIAGNOSIS — S065X3S Traumatic subdural hemorrhage with loss of consciousness of 1 hour to 5 hours 59 minutes, sequela: Secondary | ICD-10-CM | POA: Diagnosis not present

## 2018-01-15 DIAGNOSIS — W19XXXD Unspecified fall, subsequent encounter: Secondary | ICD-10-CM | POA: Insufficient documentation

## 2018-01-15 DIAGNOSIS — H9190 Unspecified hearing loss, unspecified ear: Secondary | ICD-10-CM | POA: Diagnosis not present

## 2018-01-15 DIAGNOSIS — G40909 Epilepsy, unspecified, not intractable, without status epilepticus: Secondary | ICD-10-CM | POA: Diagnosis not present

## 2018-01-15 DIAGNOSIS — F329 Major depressive disorder, single episode, unspecified: Secondary | ICD-10-CM | POA: Insufficient documentation

## 2018-01-15 DIAGNOSIS — Z9181 History of falling: Secondary | ICD-10-CM | POA: Diagnosis not present

## 2018-01-15 DIAGNOSIS — E039 Hypothyroidism, unspecified: Secondary | ICD-10-CM | POA: Diagnosis not present

## 2018-01-15 NOTE — Patient Instructions (Signed)
PLEASE FEEL FREE TO CALL OUR OFFICE WITH ANY PROBLEMS OR QUESTIONS ([phone])      

## 2018-01-15 NOTE — Progress Notes (Addendum)
Subjective:    Patient ID: Glen [name], male    DOB: 1970/08/20, 47 y.o.   MRN: [id number]  HPI   Glen [name] is here in follow-up of his traumatic subdural hematoma associated gait disorder.  I last saw him in September.  He had his gallbladder surgery shortly after I saw him and did well.  He has been doing a bit better with balance and mobility since completing therapy but remains a fall risk.  Family notes that he has more problems with speech cognition and balance at times.  I asked him if it was more a problem at the end of the day or if he was fatigued or sick and they both said "yes".  He denies much of any pain.  He has been continent of bowel and bladder.  Appetite is excellent.  No further seizures have been seen.    Pain Inventory Average Pain 1 Pain Right Now 0 My pain is no pain  In the last 24 hours, has pain interfered with the following? General activity 0 Relation with others 0 Enjoyment of life 0 What TIME of day is your pain at its worst? no pain Sleep (in general) Good  Pain is worse with: no pain Pain improves with: no pain Relief from Meds: no pain  Mobility walk with assistance use a walker ability to climb steps?  no do you drive?  no use a wheelchair  Function not employed: date last employed . I need assistance with the following:  dressing  Neuro/Psych weakness trouble walking confusion anxiety  Prior Studies Any changes since last visit?  no  Physicians involved in your care Any changes since last visit?  no   Family History  Adopted: Yes   Social History   Socioeconomic History  . Marital status: Married    Spouse name: Not on file  . Number of children: 1  . Years of education: college  . Highest education level: Not on file  Occupational History  . Occupation: Disabled  Social Needs  . Financial resource strain: Not on file  . Food insecurity:    Worry: Not on file    Inability: Not on file  . Transportation needs:      Medical: Not on file    Non-medical: Not on file  Tobacco Use  . Smoking status: Never Smoker  . Smokeless tobacco: Never Used  Substance and Sexual Activity  . Alcohol use: No    Frequency: Never  . Drug use: No  . Sexual activity: Not on file  Lifestyle  . Physical activity:    Days per week: Not on file    Minutes per session: Not on file  . Stress: Not on file  Relationships  . Social connections:    Talks on phone: Not on file    Gets together: Not on file    Attends religious service: Not on file    Active member of club or organization: Not on file    Attends meetings of clubs or organizations: Not on file    Relationship status: Not on file  Other Topics Concern  . Not on file  Social History Narrative   Lives at home with parents.   Right-handed.   Occasional caffeine.   Past Surgical History:  Procedure Laterality Date  . APPENDECTOMY     1980s  . BRAIN SURGERY     1993  . CRANIOTOMY Right 04/09/2017   Procedure: CRANIOTOMY HEMATOMA EVACUATION SUBDURAL;  Surgeon: Erline Levine,  MD;  Location: [facility];  Service: Neurosurgery;  Laterality: Right;  . DIRECT LARYNGOSCOPY N/A 04/09/2017   Procedure: DIRECT LARYNGOSCOPY;  Surgeon: Erline Levine, MD;  Location: [facility];  Service: Neurosurgery;  Laterality: N/A;  . DIRECT LARYNGOSCOPY N/A 04/09/2017   Procedure: DIRECT LARYNGOSCOPY;  Surgeon: [name], MD;  Location: [facility];  Service: ENT;  Laterality: N/A;  . DIRECT LARYNGOSCOPY N/A 04/18/2017   Procedure: DIRECT LARYNGOSCOPY;  Surgeon: [name], MD;  Location: [facility];  Service: ENT;  Laterality: N/A;  . TRACHEOSTOMY TUBE PLACEMENT N/A 04/18/2017   Procedure: TRANSNASAL FIBER OPTIC LARYNGOSCOPY;  Surgeon: [name], MD;  Location: [facility];  Service: ENT;  Laterality: N/A;   Past Medical History:  Diagnosis Date  . Brain cancer ([place])   . Hypercholesteremia   . Seizure ([place])   . Stroke ([place])   . Thyroid disorder    BP 104/74   Pulse 76   SpO2 94%    Opioid Risk Score:   Fall Risk Score:  `1  Depression screen PHQ 2/9  Depression screen PHQ 2/9 05/24/2017  Decreased Interest 0  Down, Depressed, Hopeless 0  PHQ - 2 Score 0  Altered sleeping 0  Tired, decreased energy 1  Change in appetite 0  Feeling bad or failure about yourself  0  Trouble concentrating 1  Moving slowly or fidgety/restless 0  Suicidal thoughts 0  PHQ-9 Score 2  Difficult doing work/chores Not difficult at all    Review of Systems  Constitutional: Negative.   HENT: Positive for hearing loss.   Eyes: Negative.   Respiratory: Negative.   Cardiovascular: Negative.   Gastrointestinal: Negative.   Endocrine: Negative.   Genitourinary: Negative.   Musculoskeletal: Positive for gait problem.  Skin: Negative.   Allergic/Immunologic: Negative.   Neurological: Positive for weakness.  Hematological: Negative.   Psychiatric/Behavioral: Positive for confusion. The patient is nervous/anxious.   All other systems reviewed and are negative.      Objective:   Physical Exam  General: No acute distress HEENT: EOMI, oral membranes moist Cards: reg rate  Chest: normal effort Abdomen: Soft, NT, ND Skin: dry, intact Extremities: no edema  Neurological.HOHleft greater than right. Ongoing processing delays.  Speech is much more clearer today.  Strength remains nearly 5 out of 5.  He has resting tone in the right lower extremity at the knee and ankle which is about 1 out of 4.  I did not ambulate him today as he did not have his walker.  He was in his wheelchair.  Skin. craniotomy Psych:Patient is pleasant and cooperative       Assessment & Plan:  1.Decreased functional mobilitysecondary to right subdural hematoma after fall complicated by acute hypoxic respiratory failure and stridor as well as history of glioblastoma and seizure disorder -continue with HEP.  -remains a high fall risk with walker given balance and  impulsivity.          -AFO for RLE might be helpful to better stabilize right knee and RLE  -I also saw the patient for a custom manual w/c which was discussed and requested today per Cherry.  Specifically, an ultra lightweight manual wheelchair would be ideal for him given his fall risk especially on days when his balance and functionality is less than ideal (for example when he is sick or does not sleep well).  He is competent to use such a manual chair within the home. The ultra lightweight manual wheelchair will help [name] perform simple  dressing, bathing, and hygiene tasks. I concur with recent PT wheelchair evaluation.           2. Pain Management:Tylenol as needed 3. Mood:Prozac 20 mg daily Seizure disorder. Vimpat 200 mg twice daily, Keppra 1500 mg twice daily. Follow-up neurology services Dr. Krista Blue  No recurrent seizures noted 4.Dysphagia: Doing well with current diet. 5.Hypothyroidism. Synthroid 6. Hearing Loss:  Hearing aid/amplifier still required 7. Cholecystitis: Status post gallbladder surgery without any problems   Follow-up with me in about6 months. 15 minutes of direct patient care time was spent in the office today.

## 2018-02-12 ENCOUNTER — Ambulatory Visit: Payer: Medicare Other | Admitting: Adult Health

## 2018-02-12 ENCOUNTER — Telehealth: Payer: Self-pay | Admitting: *Deleted

## 2018-02-12 ENCOUNTER — Other Ambulatory Visit: Payer: Self-pay | Admitting: Physical Medicine & Rehabilitation

## 2018-02-12 ENCOUNTER — Other Ambulatory Visit: Payer: Self-pay

## 2018-02-12 NOTE — Telephone Encounter (Signed)
Spoke with pt's mother, Pamala Hurry. Pt. has an appt. with Jinny Blossom today, but unfortunately, Jinny Blossom is out of the office due to illness.  Appt. r/s to 03/19/18, arrival time of 1345 for a 1415 appt. with Sarah/fim

## 2018-02-13 ENCOUNTER — Other Ambulatory Visit: Payer: Self-pay | Admitting: *Deleted

## 2018-02-13 MED ORDER — LACOSAMIDE 200 MG PO TABS: 200.0000 mg | ORAL_TABLET | Freq: Two times a day (BID) | ORAL | 1 refills | Status: DC

## 2018-02-26 ENCOUNTER — Encounter: Payer: Self-pay | Admitting: Neurology

## 2018-03-13 DIAGNOSIS — G0481 Other encephalitis and encephalomyelitis: Secondary | ICD-10-CM | POA: Diagnosis not present

## 2018-03-13 DIAGNOSIS — E291 Testicular hypofunction: Secondary | ICD-10-CM | POA: Diagnosis not present

## 2018-03-13 DIAGNOSIS — E034 Atrophy of thyroid (acquired): Secondary | ICD-10-CM | POA: Diagnosis not present

## 2018-03-13 DIAGNOSIS — G4089 Other seizures: Secondary | ICD-10-CM | POA: Diagnosis not present

## 2018-03-13 DIAGNOSIS — E782 Mixed hyperlipidemia: Secondary | ICD-10-CM | POA: Diagnosis not present

## 2018-03-19 ENCOUNTER — Ambulatory Visit: Payer: Self-pay | Admitting: Neurology

## 2018-03-25 NOTE — Progress Notes (Signed)
PATIENT: Glen [name] DOB: 18-May-1970  REASON FOR VISIT: follow up HISTORY FROM: patient  HISTORY OF PRESENT ILLNESS: Today 03/26/18  HISTORY  Glen [name] is a 48 year old male, accompanied by his mother [name], seen in refer by his primary care PA [name], for evaluation of seizure, Initial evaluation was on February 13, 2017.  I reviewed and summarized the referring note, he has past medical history of hyperlipidemia, hypothyroidism, testosterone deficiency, carried a diagnosis of autoimmune encephalomyelitis,  He was adopted by his mother at 54 days of age, 20, was able to review [name] record, he developed double vision, was diagnosed with fourth ventricle medulloblastoma, had surgery by Dr. [name], VP shunt replacement, this was followed by radiation therapy, he also had left parafalcine meningioma resected in 2004.  He had his first seizure in 1990s, has been treated with levetiracetam 750 mg twice a day, in September 2015, he had recurrent seizure, fever, confusion, was diagnosed with autoimmune cerebritis, was treated with Solu-Medrol 1 g daily for 5 days, and plasma exchange,  Has regained significant recovery.   He had a brain biopsy as part of his workup for autoimmune cerebritis in 2015, which was consistent with gliosis/cerebritis, extensive autoimmune workup was negative,  He also had a history of thrombocytopenia,  He had recurrent seizures, consistent of eye blinking, mouth drooling,  He was admitted to ICU in November 2015 for hypercapnic respiratory failure, decreased level of arousal, required intubation, brain MRI with contrast was suggestive of recurrent cerebritis, EEG excluded seizure, LP showed no signs of active infection, he was empirically treated with plasmapheresis, and his level of arousal improved with the treatment, his hospital course was also complicated by vent dependent, require tracheostomy, as well as C. 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, since his diagnosis of autoimmune cerebritis, he has significant decline in his functional status, with rehabilitation, he was eventually able to ambulate with walker, still with very unsteady ataxic gait, loss of hearing, slurred speech, difficulty reading, he had regained significant recovery from 2016, was able to begin to read his Bible again  Since November 2018, he was noted to have recurrent spells of whining from his sleep, seems to be scared, and bothered by his nightmares, but only happens at nighttime, when his mother checked on him, he was able to tell her about some dreams, there was no seizure activity noted.  Laboratory evaluations in January 2019, LDL was 91, hemoglobin was 15.2, normal CMP creatinine of 1.1, TSH was mildly elevated 5.92, testosterone level was less than 10  CAT scan of the brain 2005 following a seizure activity and possible fall, advanced atrophy with centralized volume loss, mild dilatation of the ventricular system, left frontal approach ventriculostomy, catheter tip terminated within the third ventricle, there is a ill-defined area of high attenuation adjacent to the mid aspect of ventriculostomy, within the left frontal lobe, and left basal ganglion, which likely represent intraparenchymal hemorrhage,  UPDATE July 19 2017: EEG on April 20 2017 was mildly abnormal.  There is electrodiagnostic evidence of mild background slowing, indicating bi-hemisphere malfunction.  There is no evidence of epileptiform discharge.  MRI brain w/wo on April 18 2017 No acute intracranial abnormality, bilateral subdural hematoma, measuring up to 16 mm on the right, small subacute fa suboccipital craniotomy with severe lcotentorial subdural hematoma, status post bilateral cerebellar atrophy and some encephalomalacia, old right parieto-occipital and left frontal lobe infarction, mild  parenchymal brain volume loss,  left ventriculoperitoneal shunt without hydrocephalus, scattered chronic parenchymal hemorrhages,  He was switched from [place] to Depakote in February 2019 attempting to better control of his mood disorder, anxiety, but on March 13, 2017 who was admitted to the event of hospital, following increased confusion, falling episode,  CT head without contrast, mild diffuse cortical atrophy, old right posterior parietal infarction, stable left ventriculostomy catheter with distal tip in the third ventricle, postsurgical change  Chest x-ray, no acute disease,  EKG, left ventricular hypertrophy, nonspecific T wave abnormality, prolonged QT,  Laboratory evaluations, hemoglobin of 14.4, WBC of 7.0, normal CMP, creatinine of 0.9  He was switched to Keppra  1500 mg twice a day liquid form, now he is back to his baseline,  Update March 26, 2018 SS:  He is at today's appointment with his mother.  She reports he is doing well.  He is currently taking Keppra liquid 15 mils (1500 mg) twice daily, Vimpat 200 mg twice daily.  He has not had any seizure episodes.  He was taken off Depakote previously and switched to Vimpat due to falling.  His mother reports occasionally at night she will notice that he is reaching like he is pushing someone away in his sleep.  He generally sleeps well.  She has noticed that he pays close attention, studies paintings, church murals.  He is to be a Company secretary. He uses a walker for ambulation, is unsteady, off balance.  He has a great appetite, enjoys doing crossword puzzles, watching basketball.  He is hard of hearing wearing hearing aids, very limited verbal response, difficult to understand.  He had labs done recently had at his primary care doctor that were normal.  In September 2019 he had his gallbladder removed.   REVIEW OF SYSTEMS: Out of a complete 14 system review of symptoms, the patient complains only of the following symptoms, and all other reviewed systems are  negative.  Walking difficulty, speech difficulty, rash, hallucinations  ALLERGIES: Allergies  Allergen Reactions  . Heparin Anaphylaxis    Patient has HITT  . Lidocaine Hives  . Depakote [Divalproex Sodium] Rash  . Phenytoin Sodium Extended Rash    HOME MEDICATIONS: Outpatient Medications Prior to Visit  Medication Sig Dispense Refill  . atorvastatin (LIPITOR) 20 MG tablet Take 1 tablet (20 mg total) by mouth daily. 30 tablet 2  . FLUoxetine (PROZAC) 20 MG capsule Take 1 capsule (20 mg total) by mouth daily. 30 capsule 2  . lacosamide (VIMPAT) 200 MG TABS tablet Take 1 tablet (200 mg total) by mouth 2 (two) times daily. 60 tablet 1  . levETIRAcetam (KEPPRA) 100 MG/ML solution Take 15 mLs (1,500 mg total) by mouth 2 (two) times daily. 1000 mL 11  . levothyroxine (SYNTHROID, LEVOTHROID) 50 MCG tablet Take 1 tablet (50 mcg total) by mouth daily before breakfast. 90 tablet 3  . loratadine (CLARITIN) 10 MG tablet Take 10 mg by mouth daily.  1  . polyethylene glycol (MIRALAX / GLYCOLAX) packet Take 17 g by mouth daily. 14 each 0  . testosterone cypionate (DEPOTESTOSTERONE CYPIONATE) 200 MG/ML injection Inject 1 mL into the muscle every 14 (fourteen) days.     No facility-administered medications prior to visit.     PAST MEDICAL HISTORY: Past Medical History:  Diagnosis Date  . Brain cancer ([place])   . Hypercholesteremia   . Seizure ([place])   . Stroke ([place])   . Thyroid disorder     PAST SURGICAL HISTORY: Past Surgical History:  Procedure Laterality Date  . APPENDECTOMY     1980s  . BRAIN SURGERY     1993  . CRANIOTOMY Right 04/09/2017   Procedure: CRANIOTOMY HEMATOMA EVACUATION SUBDURAL;  Surgeon: Erline Levine, MD;  Location: [facility];  Service: Neurosurgery;  Laterality: Right;  . DIRECT LARYNGOSCOPY N/A 04/09/2017   Procedure: DIRECT LARYNGOSCOPY;  Surgeon: Erline Levine, MD;  Location: [facility];  Service: Neurosurgery;  Laterality: N/A;  . DIRECT LARYNGOSCOPY N/A 04/09/2017    Procedure: DIRECT LARYNGOSCOPY;  Surgeon: [name], MD;  Location: [facility];  Service: ENT;  Laterality: N/A;  . DIRECT LARYNGOSCOPY N/A 04/18/2017   Procedure: DIRECT LARYNGOSCOPY;  Surgeon: [name], MD;  Location: [facility];  Service: ENT;  Laterality: N/A;  . TRACHEOSTOMY TUBE PLACEMENT N/A 04/18/2017   Procedure: TRANSNASAL FIBER OPTIC LARYNGOSCOPY;  Surgeon: [name], MD;  Location: [facility];  Service: ENT;  Laterality: N/A;    FAMILY HISTORY: Family History  Adopted: Yes    SOCIAL HISTORY: Social History   Socioeconomic History  . Marital status: Married    Spouse name: Not on file  . Number of children: 1  . Years of education: college  . Highest education level: Not on file  Occupational History  . Occupation: Disabled  Social Needs  . Financial resource strain: Not on file  . Food insecurity:    Worry: Not on file    Inability: Not on file  . Transportation needs:    Medical: Not on file    Non-medical: Not on file  Tobacco Use  . Smoking status: Never Smoker  . Smokeless tobacco: Never Used  Substance and Sexual Activity  . Alcohol use: No    Frequency: Never  . Drug use: No  . Sexual activity: Not on file  Lifestyle  . Physical activity:    Days per week: Not on file    Minutes per session: Not on file  . Stress: Not on file  Relationships  . Social connections:    Talks on phone: Not on file    Gets together: Not on file    Attends religious service: Not on file    Active member of club or organization: Not on file    Attends meetings of clubs or organizations: Not on file    Relationship status: Not on file  . Intimate partner violence:    Fear of current or ex partner: Not on file    Emotionally abused: Not on file    Physically abused: Not on file    Forced sexual activity: Not on file  Other Topics Concern  . Not on file  Social History Narrative   Lives at home with parents.   Right-handed.   Occasional caffeine.      PHYSICAL  EXAM  Vitals:   03/26/18 1025  BP: 119/83  Pulse: 84  Weight: 206 lb (93.4 kg)  Height: 5\' 11"  (1.803 m)   Body mass index is 28.73 kg/m.  Generalized: Well developed, in no acute distress   Neurological examination  Mentation: Alert, limited verbal response, speech difficult to understand, difficulty following exam commands, Hard of hearing, rely on mother for history Cranial nerve II-XII: Pupils were equal round reactive to light.  Motor: The motor testing reveals 5 over 5 strength of all 4 extremities. Good symmetric motor tone is noted throughout.  Sensory: Sensory testing is intact to soft touch on all 4 extremities. No evidence of extinction is noted.  Coordination: Unable to perform Gait and station:  Gait is unsteady, very cautious, wide-based, using a walker, leaning forward Reflexes: Deep tendon reflexes are symmetric and hyperactive.   DIAGNOSTIC DATA (LABS, IMAGING, TESTING) - I reviewed patient records, labs, notes, testing and imaging myself where available.  Lab Results  Component Value Date   WBC 5.4 05/08/2017   HGB 12.2 (L) 05/08/2017   HCT 37.1 (L) 05/08/2017   MCV 88.3 05/08/2017   PLT 208 05/08/2017      Component Value Date/Time   NA 138 05/15/2017 0603   K 4.2 05/15/2017 0603   CL 100 (L) 05/15/2017 0603   CO2 32 05/15/2017 0603   GLUCOSE 99 05/15/2017 0603   BUN 9 05/15/2017 0603   CREATININE 0.87 05/15/2017 0603   CALCIUM 9.7 05/15/2017 0603   PROT 5.7 (L) 04/25/2017 0626   ALBUMIN 3.0 (L) 04/25/2017 0626   AST 19 04/25/2017 0626   ALT 22 04/25/2017 0626   ALKPHOS 54 04/25/2017 0626   BILITOT 0.6 04/25/2017 0626   GFRNONAA >60 05/15/2017 0603   GFRAA >60 05/15/2017 0603   Lab Results  Component Value Date   TRIG 73 04/13/2017   Lab Results  Component Value Date   HGBA1C 5.5 12/24/2013   Lab Results  Component Value Date   VITAMINB12 882 12/24/2013   Lab Results  Component Value Date   TSH 0.202 (L) 01/12/2014       ASSESSMENT AND PLAN 49 y.o. year old male  has a past medical history of Brain cancer ([place]), Hypercholesteremia, Seizure ([place]), Stroke ([place]), and Thyroid disorder. here with:  1.  Complex partial seizure, history of brainstem medulloblastoma, parafalcine meningioma, history of autoimmune encephalitis  Overall his mother reports he is doing well.  He has not had any seizure events.  He will continue taking Keppra and Vimpat.  She reports a refill is not needed at this time.  He is tolerating the medications well.  He had lab work checked at his primary care doctor recently, it was normal.  He will follow-up in 6 months or sooner if needed.  I advised that if his symptoms worsen or if he develops any new symptoms they should let us know.   I spent 15 minutes with the patient. 50% of this time was spent discussing his plan of care.    Butler Denmark, AGNP-C, DNP 03/26/2018, 11:20 AM [facility] [address], [place],  [address] [phone]

## 2018-03-26 ENCOUNTER — Ambulatory Visit (INDEPENDENT_AMBULATORY_CARE_PROVIDER_SITE_OTHER): Payer: Medicare Other | Admitting: Neurology

## 2018-03-26 ENCOUNTER — Encounter: Payer: Self-pay | Admitting: Neurology

## 2018-03-26 VITALS — BP 119/83 | HR 84 | Ht 71.0 in | Wt 206.0 lb

## 2018-03-26 DIAGNOSIS — R569 Unspecified convulsions: Secondary | ICD-10-CM | POA: Diagnosis not present

## 2018-03-27 DIAGNOSIS — R278 Other lack of coordination: Secondary | ICD-10-CM | POA: Diagnosis not present

## 2018-03-27 DIAGNOSIS — Z736 Limitation of activities due to disability: Secondary | ICD-10-CM | POA: Diagnosis not present

## 2018-03-27 DIAGNOSIS — R1310 Dysphagia, unspecified: Secondary | ICD-10-CM | POA: Diagnosis not present

## 2018-03-27 DIAGNOSIS — G40909 Epilepsy, unspecified, not intractable, without status epilepticus: Secondary | ICD-10-CM | POA: Diagnosis not present

## 2018-03-27 DIAGNOSIS — R269 Unspecified abnormalities of gait and mobility: Secondary | ICD-10-CM | POA: Diagnosis not present

## 2018-03-28 NOTE — Progress Notes (Signed)
I have reviewed and agreed above plan. 

## 2018-04-12 ENCOUNTER — Other Ambulatory Visit: Payer: Self-pay | Admitting: Physical Medicine & Rehabilitation

## 2018-04-13 NOTE — Telephone Encounter (Signed)
Please have neurologist take over this RX moving forward, thx

## 2018-06-12 DIAGNOSIS — Z0000 Encounter for general adult medical examination without abnormal findings: Secondary | ICD-10-CM | POA: Diagnosis not present

## 2018-06-12 DIAGNOSIS — E782 Mixed hyperlipidemia: Secondary | ICD-10-CM | POA: Diagnosis not present

## 2018-06-12 DIAGNOSIS — Z6830 Body mass index (BMI) 30.0-30.9, adult: Secondary | ICD-10-CM | POA: Diagnosis not present

## 2018-06-12 DIAGNOSIS — Z125 Encounter for screening for malignant neoplasm of prostate: Secondary | ICD-10-CM | POA: Diagnosis not present

## 2018-06-12 DIAGNOSIS — E034 Atrophy of thyroid (acquired): Secondary | ICD-10-CM | POA: Diagnosis not present

## 2018-06-23 DIAGNOSIS — R569 Unspecified convulsions: Secondary | ICD-10-CM | POA: Diagnosis not present

## 2018-06-23 DIAGNOSIS — G40909 Epilepsy, unspecified, not intractable, without status epilepticus: Secondary | ICD-10-CM | POA: Diagnosis not present

## 2018-06-23 DIAGNOSIS — R1310 Dysphagia, unspecified: Secondary | ICD-10-CM | POA: Diagnosis not present

## 2018-06-23 DIAGNOSIS — S065X9A Traumatic subdural hemorrhage with loss of consciousness of unspecified duration, initial encounter: Secondary | ICD-10-CM | POA: Diagnosis not present

## 2018-07-16 ENCOUNTER — Ambulatory Visit: Payer: Medicare Other | Admitting: Physical Medicine & Rehabilitation

## 2018-07-23 DIAGNOSIS — G40909 Epilepsy, unspecified, not intractable, without status epilepticus: Secondary | ICD-10-CM | POA: Diagnosis not present

## 2018-07-23 DIAGNOSIS — S065X9A Traumatic subdural hemorrhage with loss of consciousness of unspecified duration, initial encounter: Secondary | ICD-10-CM | POA: Diagnosis not present

## 2018-07-23 DIAGNOSIS — R1310 Dysphagia, unspecified: Secondary | ICD-10-CM | POA: Diagnosis not present

## 2018-07-23 DIAGNOSIS — R569 Unspecified convulsions: Secondary | ICD-10-CM | POA: Diagnosis not present

## 2018-08-04 ENCOUNTER — Other Ambulatory Visit: Payer: Self-pay | Admitting: Physical Medicine & Rehabilitation

## 2018-08-22 DIAGNOSIS — H469 Unspecified optic neuritis: Secondary | ICD-10-CM | POA: Diagnosis not present

## 2018-08-22 DIAGNOSIS — D320 Benign neoplasm of cerebral meninges: Secondary | ICD-10-CM | POA: Diagnosis not present

## 2018-08-23 DIAGNOSIS — S065X9A Traumatic subdural hemorrhage with loss of consciousness of unspecified duration, initial encounter: Secondary | ICD-10-CM | POA: Diagnosis not present

## 2018-08-23 DIAGNOSIS — R1310 Dysphagia, unspecified: Secondary | ICD-10-CM | POA: Diagnosis not present

## 2018-08-23 DIAGNOSIS — G40909 Epilepsy, unspecified, not intractable, without status epilepticus: Secondary | ICD-10-CM | POA: Diagnosis not present

## 2018-09-05 ENCOUNTER — Encounter: Payer: Self-pay | Admitting: Physical Medicine & Rehabilitation

## 2018-09-05 ENCOUNTER — Encounter: Payer: Medicare HMO | Attending: Physical Medicine & Rehabilitation | Admitting: Physical Medicine & Rehabilitation

## 2018-09-05 ENCOUNTER — Other Ambulatory Visit: Payer: Self-pay

## 2018-09-05 VITALS — BP 118/77 | HR 75 | Temp 98.5°F | Ht 75.0 in | Wt 205.0 lb

## 2018-09-05 DIAGNOSIS — R269 Unspecified abnormalities of gait and mobility: Secondary | ICD-10-CM

## 2018-09-05 DIAGNOSIS — S065X3S Traumatic subdural hemorrhage with loss of consciousness of 1 hour to 5 hours 59 minutes, sequela: Secondary | ICD-10-CM

## 2018-09-05 NOTE — Progress Notes (Signed)
Subjective:    Patient ID: Glen [name], male    DOB: [birth year], 48 y.o.   MRN: [id number]  HPI   Glen [name] is here in follow up of his right SDH. I last saw him 6 months ago. He has yet to receive his w/c d/t some snags with RX. His primary placed him on seroquel which caused excess sedation. Glen [name] stopped after a week or so. He has been improving since off the medication  He has not received his wheelchair yet.  Apparently there were some discrepancies in the prescription per mother.  She is not sure exactly what the problem is and has run into a bit of a dad and there.  Glen [name] is using his walker at home with supervision.  No falls reported.  No new seizure activity reported.  Remains on Keppra 1500 mg twice daily for seizure prophylaxis.    Pain Inventory Average Pain 0 Pain Right Now 0 My pain is na  In the last 24 hours, has pain interfered with the following? General activity 0 Relation with others 0 Enjoyment of life 0 What TIME of day is your pain at its worst? na Sleep (in general) Good  Pain is worse with: nA Pain improves with: NA Relief from Meds: .  Mobility use a walker ability to climb steps?  no do you drive?  no  Function disabled: date disabled . I need assistance with the following:  dressing, bathing, toileting and meal prep  Neuro/Psych bladder control problems weakness numbness trouble walking confusion anxiety  Prior Studies Any changes since last visit?  no  Physicians involved in your care Any changes since last visit?  no   Family History  Adopted: Yes   Social History   Socioeconomic History  . Marital status: Married    Spouse name: Not on file  . Number of children: 1  . Years of education: college  . Highest education level: Not on file  Occupational History  . Occupation: Disabled  Social Needs  . Financial resource strain: Not on file  . Food insecurity    Worry: Not on file    Inability: Not on file  .  Transportation needs    Medical: Not on file    Non-medical: Not on file  Tobacco Use  . Smoking status: Never Smoker  . Smokeless tobacco: Never Used  Substance and Sexual Activity  . Alcohol use: No    Frequency: Never  . Drug use: No  . Sexual activity: Not on file  Lifestyle  . Physical activity    Days per week: Not on file    Minutes per session: Not on file  . Stress: Not on file  Relationships  . Social Herbalist on phone: Not on file    Gets together: Not on file    Attends religious service: Not on file    Active member of club or organization: Not on file    Attends meetings of clubs or organizations: Not on file    Relationship status: Not on file  Other Topics Concern  . Not on file  Social History Narrative   Lives at home with parents.   Right-handed.   Occasional caffeine.   Past Surgical History:  Procedure Laterality Date  . APPENDECTOMY     1980s  . BRAIN SURGERY     1993  . CRANIOTOMY Right 04/09/2017   Procedure: CRANIOTOMY HEMATOMA EVACUATION SUBDURAL;  Surgeon: Erline Levine, MD;  Location: [facility];  Service: Neurosurgery;  Laterality: Right;  . DIRECT LARYNGOSCOPY N/A 04/09/2017   Procedure: DIRECT LARYNGOSCOPY;  Surgeon: Erline Levine, MD;  Location: [place];  Service: Neurosurgery;  Laterality: N/A;  . DIRECT LARYNGOSCOPY N/A 04/09/2017   Procedure: DIRECT LARYNGOSCOPY;  Surgeon: [name], MD;  Location: [place];  Service: ENT;  Laterality: N/A;  . DIRECT LARYNGOSCOPY N/A 04/18/2017   Procedure: DIRECT LARYNGOSCOPY;  Surgeon: [name], MD;  Location: [place];  Service: ENT;  Laterality: N/A;  . TRACHEOSTOMY TUBE PLACEMENT N/A 04/18/2017   Procedure: TRANSNASAL FIBER OPTIC LARYNGOSCOPY;  Surgeon: [name], MD;  Location: [place];  Service: ENT;  Laterality: N/A;   Past Medical History:  Diagnosis Date  . Brain cancer ([place])   . Hypercholesteremia   . Seizure ([place])   . Stroke ([place])   . Thyroid disorder    BP 118/77   Pulse 75    Temp 98.5 F (36.9 C)   Ht 6\' 3"  (1.905 m)   Wt 205 lb (93 kg)   SpO2 94%   BMI 25.62 kg/m   Opioid Risk Score:   Fall Risk Score:  `1  Depression screen PHQ 2/9  Depression screen PHQ 2/9 05/24/2017  Decreased Interest 0  Down, Depressed, Hopeless 0  PHQ - 2 Score 0  Altered sleeping 0  Tired, decreased energy 1  Change in appetite 0  Feeling bad or failure about yourself  0  Trouble concentrating 1  Moving slowly or fidgety/restless 0  Suicidal thoughts 0  PHQ-9 Score 2  Difficult doing work/chores Not difficult at all     Review of Systems  Constitutional: Negative.   HENT: Negative.   Eyes: Negative.   Respiratory: Negative.   Cardiovascular: Negative.   Gastrointestinal: Negative.   Endocrine: Negative.   Genitourinary: Positive for difficulty urinating.  Musculoskeletal: Positive for gait problem.  Skin: Negative.   Allergic/Immunologic: Negative.   Neurological: Positive for weakness and numbness.  Hematological: Negative.   Psychiatric/Behavioral: Positive for confusion and dysphoric mood.  All other systems reviewed and are negative.      Objective:   Physical Exam  General: No acute distress HEENT: EOMI, oral membranes moist Cards: reg rate  Chest: normal effort Abdomen: Soft, NT, ND Skin: dry, intact Extremities: no edema   Neurological.hoh. Slow to process. Not very verbal today but follows simple commands.   Strength remains nearly 5 out of 5.  He has resting tone in the right lower extremity at the knee and ankle which is about 1 out of 4.  I did not ambulate him today as he did not have his walker.  He is in a wheelchair.   Skin. craniotomysites clean Psych:Patient is pleasant and cooperative       Assessment & Plan:  1.Decreased functional mobilitysecondary to right subdural hematoma after fall complicated by acute hypoxic respiratory failure and stridor as well as history of glioblastoma and seizure disorder  -continue with HEP.  -remains a high fall risk with walker given balance and impulsivity. -AFO for RLE might be helpful to better stabilize right knee and RLE       -Patient still has not received wheelchair.  Discussed with mother and asked them to follow-up with physical therapy as well as the vendor.  If we need to help in any way to get this done we will be happy to.  2. Insomnia: Seroquel probably is a bit much for him.  Recommended good sleep hygiene which we discussed today.  May want to try some over-the-counter melatonin 3 to 6 mg nightly if sleep remains a problem. 3. Mood:Prozac 20 mg daily Seizure disorder. Vimpat 200 mg twice daily, Keppra 1500 mg twice daily. Follow-up neurology services Dr. Krista Blue  No new seizures 4.Dysphagia:Doing well with current diet. 5.Hypothyroidism. Synthroid 6. Hearing Loss: Hearing aid/amplifier still required 7. Cholecystitis: Status post gallbladder surgery without any problems   50 minutes of direct patient care time was spent today.  I will see him back in about 6 months.

## 2018-09-05 NOTE — Patient Instructions (Signed)
TALK TO  VENDOR ABOUT THE WHEELCHAIR PRESCRIPTION. LET ME KNOW IF THERE IS ANYTHING WE CAN DO TO HELP.   TRY MELATONIN 3-6 MG FOR SLEEP IF SLEEP BECOMES A PROBLEM AGAIN--IT IS OVER THE COUNTER

## 2018-09-13 DIAGNOSIS — G4089 Other seizures: Secondary | ICD-10-CM | POA: Diagnosis not present

## 2018-09-13 DIAGNOSIS — E291 Testicular hypofunction: Secondary | ICD-10-CM | POA: Diagnosis not present

## 2018-09-13 DIAGNOSIS — G0481 Other encephalitis and encephalomyelitis: Secondary | ICD-10-CM | POA: Diagnosis not present

## 2018-09-13 DIAGNOSIS — E782 Mixed hyperlipidemia: Secondary | ICD-10-CM | POA: Diagnosis not present

## 2018-09-13 DIAGNOSIS — E034 Atrophy of thyroid (acquired): Secondary | ICD-10-CM | POA: Diagnosis not present

## 2018-09-23 DIAGNOSIS — G40909 Epilepsy, unspecified, not intractable, without status epilepticus: Secondary | ICD-10-CM | POA: Diagnosis not present

## 2018-09-23 DIAGNOSIS — R569 Unspecified convulsions: Secondary | ICD-10-CM | POA: Diagnosis not present

## 2018-09-23 DIAGNOSIS — S065X9A Traumatic subdural hemorrhage with loss of consciousness of unspecified duration, initial encounter: Secondary | ICD-10-CM | POA: Diagnosis not present

## 2018-09-23 DIAGNOSIS — R1310 Dysphagia, unspecified: Secondary | ICD-10-CM | POA: Diagnosis not present

## 2018-09-26 ENCOUNTER — Ambulatory Visit (INDEPENDENT_AMBULATORY_CARE_PROVIDER_SITE_OTHER): Payer: Medicare HMO | Admitting: Neurology

## 2018-09-26 ENCOUNTER — Encounter: Payer: Self-pay | Admitting: Neurology

## 2018-09-26 ENCOUNTER — Other Ambulatory Visit: Payer: Self-pay

## 2018-09-26 VITALS — BP 126/85 | HR 76 | Temp 97.5°F | Ht 75.0 in

## 2018-09-26 DIAGNOSIS — R569 Unspecified convulsions: Secondary | ICD-10-CM | POA: Diagnosis not present

## 2018-09-26 MED ORDER — LEVETIRACETAM 100 MG/ML PO SOLN: 1500.0000 mg | Freq: Two times a day (BID) | ORAL | 11 refills | Status: DC

## 2018-09-26 NOTE — Progress Notes (Signed)
PATIENT: Glen [name] DOB: November 30, 1970  REASON FOR VISIT: follow up HISTORY FROM: patient  HISTORY OF PRESENT ILLNESS: Today 09/26/18  HISTORY  Glen [name] a 48 year old male, accompanied by his mother [name], seen in refer by his primary care PA [name], for evaluation of seizure, Initial evaluation was on February 13, 2017.  I reviewed and summarized the referring note, he has past medical history of hyperlipidemia, hypothyroidism, testosterone deficiency, carried a diagnosis of autoimmune encephalomyelitis,  He was adopted by his mother at 87 days of age, 12, was able to review [name] record, he developed double vision, was diagnosed with fourth ventricle medulloblastoma, had surgery by Dr. [name], VP shunt replacement, this was followed by radiation therapy, he also had left parafalcine meningioma resected in 2004.  He had his first seizure in 1990s, has been treated with levetiracetam 750 mg twice a day, in September 2015, he had recurrent seizure, fever, confusion, was diagnosed with autoimmune cerebritis, was treated with Solu-Medrol 1 g daily for 5 days, and plasma exchange, Has regained significant recovery.   He had a brain biopsy as part of his workup for autoimmune cerebritis in 2015, which was consistent with gliosis/cerebritis, extensive autoimmune workup was negative, He also had a history of thrombocytopenia,  He had recurrent seizures, consistent of eye blinking, mouth drooling,  He was admitted to ICU in November 2015 for hypercapnic respiratory failure, decreased level of arousal, required intubation, brain MRI with contrast was suggestive of recurrent cerebritis, EEG excluded seizure, LP showed no signs of active infection, he was empirically treated with plasmapheresis, and his level of arousal improved with the treatment, his hospital course was also complicated by vent dependent, require tracheostomy, as well as C. 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, since his diagnosis of autoimmune cerebritis, he has significant decline in his functional status, with rehabilitation, he was eventually able to ambulate with walker, still with very unsteady ataxic gait, loss of hearing, slurred speech, difficulty reading, he had regained significant recovery from 2016, was able to begin to read his Bible again  Since November 2018, he was noted to have recurrent spells of whining from his sleep, seems to be scared, and bothered by his nightmares, but only happens at nighttime, when his mother checked on him, he was able to tell her about some dreams, there was no seizure activity noted.  Laboratory evaluations in January 2019, LDL was 91, hemoglobin was 15.2, normal CMP creatinine of 1.1, TSH was mildly elevated 5.92, testosterone level was less than 10  CAT scan of the brain 2005 following a seizure activity and possible fall, advanced atrophy with centralized volume loss, mild dilatation of the ventricular system, left frontal approach ventriculostomy, catheter tip terminated within the third ventricle, there is a ill-defined area of high attenuation adjacent to the mid aspect of ventriculostomy, within the left frontal lobe, and left basal ganglion, which likely represent intraparenchymal hemorrhage,  UPDATE July 19 2017: EEG on April 20 2017 was mildly abnormal. There is electrodiagnostic evidence of mild background slowing, indicating bi-hemisphere malfunction. There is no evidence of epileptiform discharge.  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, mild parenchymal brain volume loss,  left ventriculoperitoneal  shunt without hydrocephalus, scattered chronic parenchymal hemorrhages,  He was switched from Arabi to Depakote in February 2019 attempting to better control of his mood disorder, anxiety, but on March 13, 2017 who was admitted to the event of hospital, following increased confusion, falling episode,  CT head without contrast, mild diffuse cortical atrophy, old right posterior parietal infarction, stable left ventriculostomy catheter with distal tip in the third ventricle, postsurgical change  Chest x-ray, no acute disease,  EKG, left ventricular hypertrophy, nonspecific T wave abnormality, prolonged QT,  Laboratory evaluations, hemoglobin of 14.4, WBC of 7.0, normal CMP, creatinine of 0.9  He was switched to Keppra 1500 mg twice a day liquid form, now he is back to his baseline,  Update March 26, 2018 SS:  He is at today's appointment with his mother.  She reports he is doing well.  He is currently taking Keppra liquid 15 mils (1500 mg) twice daily, Vimpat 200 mg twice daily.  He has not had any seizure episodes.  He was taken off Depakote previously and switched to Vimpat due to falling.  His mother reports occasionally at night she will notice that he is reaching like he is pushing someone away in his sleep.  He generally sleeps well.  She has noticed that he pays close attention, studies paintings, church murals.  He is to be a Company secretary. He uses a walker for ambulation, is unsteady, off balance.  He has a great appetite, enjoys doing crossword puzzles, watching basketball.  He is hard of hearing wearing hearing aids, very limited verbal response, difficult to understand.  He had labs done recently had at his primary care doctor that were normal.  In September 2019 he had his gallbladder removed.  Update September 26, 2018 SS: His walker got hung coming into office today while his mother was pushing on the walker, hit his elbow (apparently didn't hit his head),  no seizure events, sleeps well at night, he lives with his mother, uses a walker, recently ordered wheelchair by Dr. Tessa Lerner. His right leg gives away on him from previous stroke. During the day watches TV, puzzles, he enjoys church. Sometimes during the day he may feel restless in the car. He tried Seroquel and he was very drowsy.  He remains on Vimpat, liquid Keppra.  REVIEW OF SYSTEMS: Out of a complete 14 system review of symptoms, the patient complains only of the following symptoms, and all other reviewed systems are negative.  Hearing loss, blurred vision, cough, excessive thirst, excessive eating, speech difficulty, memory loss, walking difficulty, confusion, anxious  ALLERGIES: Allergies  Allergen Reactions  . Heparin Anaphylaxis    Patient has HITT  . Lidocaine Hives  . Depakote [Divalproex Sodium] Rash  . Phenytoin Sodium Extended Rash    HOME MEDICATIONS: Outpatient Medications Prior to Visit  Medication Sig Dispense Refill  . atorvastatin (LIPITOR) 20 MG tablet Take 1 tablet (20 mg total) by mouth daily. 30 tablet 2  . FLUoxetine (PROZAC) 40 MG capsule Take 40 mg by mouth daily.    Marland Kitchen levETIRAcetam (KEPPRA) 100 MG/ML solution Take 15 mLs (1,500 mg total) by mouth 2 (two) times daily. 1000 mL 11  . levothyroxine (SYNTHROID, LEVOTHROID) 50 MCG tablet Take 1 tablet (50 mcg total) by mouth daily before breakfast. 90 tablet 3  . loratadine (CLARITIN) 10 MG tablet Take 10 mg by mouth daily.  1  . montelukast (SINGULAIR) 10 MG tablet Take 10 mg by mouth daily.    . polyethylene glycol (MIRALAX / GLYCOLAX)  packet Take 17 g by mouth daily. 14 each 0  . testosterone cypionate (DEPOTESTOSTERONE CYPIONATE) 200 MG/ML injection Inject 1 mL into the muscle every 14 (fourteen) days.    Marland Kitchen VIMPAT 200 MG TABS tablet TAKE 1 TABLET BY MOUTH TWICE A DAY 60 tablet 3  . FLUoxetine (PROZAC) 20 MG capsule Take 1 capsule (20 mg total) by mouth daily. 30 capsule 2   No facility-administered  medications prior to visit.     PAST MEDICAL HISTORY: Past Medical History:  Diagnosis Date  . Brain cancer ([place])   . Hypercholesteremia   . Seizure ([place])   . Stroke ([place])   . Thyroid disorder     PAST SURGICAL HISTORY: Past Surgical History:  Procedure Laterality Date  . APPENDECTOMY     1980s  . BRAIN SURGERY     1993  . CRANIOTOMY Right 04/09/2017   Procedure: CRANIOTOMY HEMATOMA EVACUATION SUBDURAL;  Surgeon: Erline Levine, MD;  Location: [place];  Service: Neurosurgery;  Laterality: Right;  . DIRECT LARYNGOSCOPY N/A 04/09/2017   Procedure: DIRECT LARYNGOSCOPY;  Surgeon: Erline Levine, MD;  Location: [place];  Service: Neurosurgery;  Laterality: N/A;  . DIRECT LARYNGOSCOPY N/A 04/09/2017   Procedure: DIRECT LARYNGOSCOPY;  Surgeon: [name], MD;  Location: [place];  Service: ENT;  Laterality: N/A;  . DIRECT LARYNGOSCOPY N/A 04/18/2017   Procedure: DIRECT LARYNGOSCOPY;  Surgeon: [name], MD;  Location: [place];  Service: ENT;  Laterality: N/A;  . TRACHEOSTOMY TUBE PLACEMENT N/A 04/18/2017   Procedure: TRANSNASAL FIBER OPTIC LARYNGOSCOPY;  Surgeon: [name], MD;  Location: [place];  Service: ENT;  Laterality: N/A;    FAMILY HISTORY: Family History  Adopted: Yes    SOCIAL HISTORY: Social History   Socioeconomic History  . Marital status: Married    Spouse name: Not on file  . Number of children: 1  . Years of education: college  . Highest education level: Not on file  Occupational History  . Occupation: Disabled  Social Needs  . Financial resource strain: Not on file  . Food insecurity    Worry: Not on file    Inability: Not on file  . Transportation needs    Medical: Not on file    Non-medical: Not on file  Tobacco Use  . Smoking status: Never Smoker  . Smokeless tobacco: Never Used  Substance and Sexual Activity  . Alcohol use: No    Frequency: Never  . Drug use: No  . Sexual activity: Not on file  Lifestyle  . Physical activity    Days per week: Not  on file    Minutes per session: Not on file  . Stress: Not on file  Relationships  . Social Herbalist on phone: Not on file    Gets together: Not on file    Attends religious service: Not on file    Active member of club or organization: Not on file    Attends meetings of clubs or organizations: Not on file    Relationship status: Not on file  . Intimate partner violence    Fear of current or ex partner: Not on file    Emotionally abused: Not on file    Physically abused: Not on file    Forced sexual activity: Not on file  Other Topics Concern  . Not on file  Social History Narrative   Lives at home with parents.   Right-handed.   Occasional caffeine.    PHYSICAL EXAM  Vitals:  09/26/18 1055  BP: 126/85  Pulse: 76  Temp: (!) 97.5 F (36.4 C)  TempSrc: Oral  Height: 6\' 3"  (1.905 m)   Body mass index is 25.62 kg/m.  Generalized: Well developed, in no acute distress   Neurological examination  Mentation: Alert, speech difficulty, limited verbal response, limited ability to follow commands, relies on mother for history Cranial nerve II-XII: Pupils were equal round reactive to light. Extraocular movements were full, visual field were full on confrontational test. Motor: The motor testing reveals 5 over 5 strength of all 4 extremities. Good symmetric motor tone is noted throughout.  Sensory: Sensory testing is intact to soft touch on all 4 extremities. No evidence of extinction is noted.  Coordination: Unable to perform Gait and station: In a wheelchair, uses walker, stand from wheelchair pushing off, slow to rise, gait is wide-based, very cautious Reflexes: Deep tendon reflexes are symmetric   DIAGNOSTIC DATA (LABS, IMAGING, TESTING) - I reviewed patient records, labs, notes, testing and imaging myself where available.  Lab Results  Component Value Date   WBC 5.4 05/08/2017   HGB 12.2 (L) 05/08/2017   HCT 37.1 (L) 05/08/2017   MCV 88.3 05/08/2017   PLT  208 05/08/2017      Component Value Date/Time   NA 138 05/15/2017 0603   K 4.2 05/15/2017 0603   CL 100 (L) 05/15/2017 0603   CO2 32 05/15/2017 0603   GLUCOSE 99 05/15/2017 0603   BUN 9 05/15/2017 0603   CREATININE 0.87 05/15/2017 0603   CALCIUM 9.7 05/15/2017 0603   PROT 5.7 (L) 04/25/2017 0626   ALBUMIN 3.0 (L) 04/25/2017 0626   AST 19 04/25/2017 0626   ALT 22 04/25/2017 0626   ALKPHOS 54 04/25/2017 0626   BILITOT 0.6 04/25/2017 0626   GFRNONAA >60 05/15/2017 0603   GFRAA >60 05/15/2017 0603   Lab Results  Component Value Date   TRIG 73 04/13/2017   Lab Results  Component Value Date   HGBA1C 5.5 12/24/2013   Lab Results  Component Value Date   VITAMINB12 882 12/24/2013   Lab Results  Component Value Date   TSH 0.202 (L) 01/12/2014    ASSESSMENT AND PLAN 48 y.o. year old male  has a past medical history of Brain cancer ([place]), Hypercholesteremia, Seizure ([place]), Stroke ([place]), and Thyroid disorder. here with:  1.  Complex partial seizure, history of brainstem medulloblastoma, parafalcine meningioma, history of autoimmune encephalitis -He has not had recurrent seizure -Continue Vimpat 200 mg, 1 tablet twice a day (last filled by Dr. [name] 08/07/2018) -Continue Keppra 1500 mg twice daily, is on liquid, we discussed switching to pill form in future, swallows pills without difficulty -Fall in the lobby while sitting on wheelchair seat, no injury noted or reported other than small abrasion right elbow, his mother will monitor -Follow-up in 1 year or sooner if needed  I spent 15 minutes with the patient. 50% of this time was spent discussing his plan of care.   Butler Denmark, AGNP-C, DNP 09/26/2018, 11:23 AM [facility] [address] [place], [place] [address] [phone]

## 2018-09-26 NOTE — Patient Instructions (Signed)
Please continue Vimpat and Keppra

## 2018-10-01 DIAGNOSIS — Z23 Encounter for immunization: Secondary | ICD-10-CM | POA: Diagnosis not present

## 2018-10-01 NOTE — Progress Notes (Signed)
I have reviewed and agreed above plan. 

## 2018-10-03 DIAGNOSIS — R1310 Dysphagia, unspecified: Secondary | ICD-10-CM | POA: Diagnosis not present

## 2018-10-03 DIAGNOSIS — S065X9A Traumatic subdural hemorrhage with loss of consciousness of unspecified duration, initial encounter: Secondary | ICD-10-CM | POA: Diagnosis not present

## 2018-10-03 DIAGNOSIS — G40909 Epilepsy, unspecified, not intractable, without status epilepticus: Secondary | ICD-10-CM | POA: Diagnosis not present

## 2018-10-03 DIAGNOSIS — R569 Unspecified convulsions: Secondary | ICD-10-CM | POA: Diagnosis not present

## 2018-10-23 DIAGNOSIS — G40909 Epilepsy, unspecified, not intractable, without status epilepticus: Secondary | ICD-10-CM | POA: Diagnosis not present

## 2018-10-23 DIAGNOSIS — R1310 Dysphagia, unspecified: Secondary | ICD-10-CM | POA: Diagnosis not present

## 2018-10-23 DIAGNOSIS — R569 Unspecified convulsions: Secondary | ICD-10-CM | POA: Diagnosis not present

## 2018-10-23 DIAGNOSIS — S065X9A Traumatic subdural hemorrhage with loss of consciousness of unspecified duration, initial encounter: Secondary | ICD-10-CM | POA: Diagnosis not present

## 2018-11-23 DIAGNOSIS — R569 Unspecified convulsions: Secondary | ICD-10-CM | POA: Diagnosis not present

## 2018-11-23 DIAGNOSIS — G40909 Epilepsy, unspecified, not intractable, without status epilepticus: Secondary | ICD-10-CM | POA: Diagnosis not present

## 2018-11-23 DIAGNOSIS — R1310 Dysphagia, unspecified: Secondary | ICD-10-CM | POA: Diagnosis not present

## 2018-11-23 DIAGNOSIS — S065X9A Traumatic subdural hemorrhage with loss of consciousness of unspecified duration, initial encounter: Secondary | ICD-10-CM | POA: Diagnosis not present

## 2018-12-11 ENCOUNTER — Other Ambulatory Visit: Payer: Self-pay | Admitting: Physical Medicine & Rehabilitation

## 2018-12-23 DIAGNOSIS — R569 Unspecified convulsions: Secondary | ICD-10-CM | POA: Diagnosis not present

## 2018-12-23 DIAGNOSIS — S065X9A Traumatic subdural hemorrhage with loss of consciousness of unspecified duration, initial encounter: Secondary | ICD-10-CM | POA: Diagnosis not present

## 2018-12-23 DIAGNOSIS — G40909 Epilepsy, unspecified, not intractable, without status epilepticus: Secondary | ICD-10-CM | POA: Diagnosis not present

## 2018-12-23 DIAGNOSIS — R1310 Dysphagia, unspecified: Secondary | ICD-10-CM | POA: Diagnosis not present

## 2018-12-26 DIAGNOSIS — G0481 Other encephalitis and encephalomyelitis: Secondary | ICD-10-CM | POA: Diagnosis not present

## 2018-12-26 DIAGNOSIS — E034 Atrophy of thyroid (acquired): Secondary | ICD-10-CM | POA: Diagnosis not present

## 2018-12-26 DIAGNOSIS — R066 Hiccough: Secondary | ICD-10-CM | POA: Diagnosis not present

## 2018-12-26 DIAGNOSIS — G4089 Other seizures: Secondary | ICD-10-CM | POA: Diagnosis not present

## 2018-12-26 DIAGNOSIS — E291 Testicular hypofunction: Secondary | ICD-10-CM | POA: Diagnosis not present

## 2018-12-26 DIAGNOSIS — E782 Mixed hyperlipidemia: Secondary | ICD-10-CM | POA: Diagnosis not present

## 2019-01-02 DIAGNOSIS — D72828 Other elevated white blood cell count: Secondary | ICD-10-CM | POA: Diagnosis not present

## 2019-01-15 DIAGNOSIS — Z79899 Other long term (current) drug therapy: Secondary | ICD-10-CM | POA: Diagnosis not present

## 2019-01-15 DIAGNOSIS — Z86011 Personal history of benign neoplasm of the brain: Secondary | ICD-10-CM | POA: Diagnosis not present

## 2019-01-15 DIAGNOSIS — D72828 Other elevated white blood cell count: Secondary | ICD-10-CM | POA: Diagnosis not present

## 2019-01-23 DIAGNOSIS — G40909 Epilepsy, unspecified, not intractable, without status epilepticus: Secondary | ICD-10-CM | POA: Diagnosis not present

## 2019-01-23 DIAGNOSIS — S065X9A Traumatic subdural hemorrhage with loss of consciousness of unspecified duration, initial encounter: Secondary | ICD-10-CM | POA: Diagnosis not present

## 2019-01-23 DIAGNOSIS — R569 Unspecified convulsions: Secondary | ICD-10-CM | POA: Diagnosis not present

## 2019-01-23 DIAGNOSIS — R1310 Dysphagia, unspecified: Secondary | ICD-10-CM | POA: Diagnosis not present

## 2019-02-23 DIAGNOSIS — R1310 Dysphagia, unspecified: Secondary | ICD-10-CM | POA: Diagnosis not present

## 2019-02-23 DIAGNOSIS — R569 Unspecified convulsions: Secondary | ICD-10-CM | POA: Diagnosis not present

## 2019-02-23 DIAGNOSIS — S065X9A Traumatic subdural hemorrhage with loss of consciousness of unspecified duration, initial encounter: Secondary | ICD-10-CM | POA: Diagnosis not present

## 2019-02-23 DIAGNOSIS — G40909 Epilepsy, unspecified, not intractable, without status epilepticus: Secondary | ICD-10-CM | POA: Diagnosis not present

## 2019-03-06 ENCOUNTER — Encounter: Payer: Medicare HMO | Attending: Physical Medicine & Rehabilitation | Admitting: Physical Medicine & Rehabilitation

## 2019-03-06 ENCOUNTER — Other Ambulatory Visit: Payer: Self-pay

## 2019-03-06 ENCOUNTER — Encounter: Payer: Self-pay | Admitting: Physical Medicine & Rehabilitation

## 2019-03-06 VITALS — BP 106/69 | HR 82 | Temp 97.7°F

## 2019-03-06 DIAGNOSIS — S065X3S Traumatic subdural hemorrhage with loss of consciousness of 1 hour to 5 hours 59 minutes, sequela: Secondary | ICD-10-CM | POA: Insufficient documentation

## 2019-03-06 DIAGNOSIS — R269 Unspecified abnormalities of gait and mobility: Secondary | ICD-10-CM | POA: Insufficient documentation

## 2019-03-06 NOTE — Patient Instructions (Signed)
?  ASK DR Krista Blue ABOUT THE POSSIBILITY OF REDUCING THE KEPPRA OR VIMPAT DOSING GIVEN THAT HE'S HAVING CONFUSION AFTER TAKING THEM.

## 2019-03-06 NOTE — Progress Notes (Signed)
Subjective:    Patient ID: Glen [name], male    DOB: Feb 10, 1970, 49 y.o.   MRN: [id number]  HPI   Glen [name] is here in follow up of his right SDH. He finally received his w/c after we last met. He has done well with this at home as it has allowed safer mobility at home and in the community  Mom notes that he has some confusion at [facility] and is often "restless" when he tries to sleep. Mom notices it at most after he receives his keppra and vimpat.  He hasn't had any more seizures.   He remains very hard of hearing.    Pain Inventory Average Pain 0 Pain Right Now 0 My pain is no pain  In the last 24 hours, has pain interfered with the following? General activity 0 Relation with others 0 Enjoyment of life 0 What TIME of day is your pain at its worst? no pain Sleep (in general) Poor  Pain is worse with: no pain Pain improves with: no [pain Relief from Meds: no pain  Mobility walk with assistance use a walker ability to climb steps?  no do you drive?  no use a wheelchair needs help with transfers  Function disabled: date disabled . I need assistance with the following:  dressing and toileting  Neuro/Psych weakness trouble walking confusion anxiety  Prior Studies Any changes since last visit?  no  Physicians involved in your care Any changes since last visit?  no   Family History  Adopted: Yes   Social History   Socioeconomic History  . Marital status: Married    Spouse name: Not on file  . Number of children: 1  . Years of education: college  . Highest education level: Not on file  Occupational History  . Occupation: Disabled  Tobacco Use  . Smoking status: Never Smoker  . Smokeless tobacco: Never Used  Substance and Sexual Activity  . Alcohol use: No  . Drug use: No  . Sexual activity: Not on file  Other Topics Concern  . Not on file  Social History Narrative   Lives at home with parents.   Right-handed.   Occasional caffeine.    Social Determinants of Health   Financial Resource Strain:   . Difficulty of Paying Living Expenses: Not on file  Food Insecurity:   . Worried About Charity fundraiser in the Last Year: Not on file  . Ran Out of Food in the Last Year: Not on file  Transportation Needs:   . Lack of Transportation (Medical): Not on file  . Lack of Transportation (Non-Medical): Not on file  Physical Activity:   . Days of Exercise per Week: Not on file  . Minutes of Exercise per Session: Not on file  Stress:   . Feeling of Stress : Not on file  Social Connections:   . Frequency of Communication with Friends and Family: Not on file  . Frequency of Social Gatherings with Friends and Family: Not on file  . Attends Religious Services: Not on file  . Active Member of Clubs or Organizations: Not on file  . Attends Archivist Meetings: Not on file  . Marital Status: Not on file   Past Surgical History:  Procedure Laterality Date  . APPENDECTOMY     1980s  . BRAIN SURGERY     1993  . CRANIOTOMY Right 04/09/2017   Procedure: CRANIOTOMY HEMATOMA EVACUATION SUBDURAL;  Surgeon: Erline Levine, MD;  Location: [facility]  OR;  Service: Neurosurgery;  Laterality: Right;  . DIRECT LARYNGOSCOPY N/A 04/09/2017   Procedure: DIRECT LARYNGOSCOPY;  Surgeon: Erline Levine, MD;  Location: [place];  Service: Neurosurgery;  Laterality: N/A;  . DIRECT LARYNGOSCOPY N/A 04/09/2017   Procedure: DIRECT LARYNGOSCOPY;  Surgeon: [name], MD;  Location: [place];  Service: ENT;  Laterality: N/A;  . DIRECT LARYNGOSCOPY N/A 04/18/2017   Procedure: DIRECT LARYNGOSCOPY;  Surgeon: [name], MD;  Location: [place];  Service: ENT;  Laterality: N/A;  . TRACHEOSTOMY TUBE PLACEMENT N/A 04/18/2017   Procedure: TRANSNASAL FIBER OPTIC LARYNGOSCOPY;  Surgeon: [name], MD;  Location: [place];  Service: ENT;  Laterality: N/A;   Past Medical History:  Diagnosis Date  . Brain cancer ([place])   . Hypercholesteremia   . Seizure ([place])   .  Stroke ([place])   . Thyroid disorder    BP 106/69   Pulse 82   Temp 97.7 F (36.5 C)   SpO2 93%   Opioid Risk Score:   Fall Risk Score:  `1  Depression screen PHQ 2/9  Depression screen PHQ 2/9 05/24/2017  Decreased Interest 0  Down, Depressed, Hopeless 0  PHQ - 2 Score 0  Altered sleeping 0  Tired, decreased energy 1  Change in appetite 0  Feeling bad or failure about yourself  0  Trouble concentrating 1  Moving slowly or fidgety/restless 0  Suicidal thoughts 0  PHQ-9 Score 2  Difficult doing work/chores Not difficult at all    Review of Systems  Constitutional: Negative.   HENT: Negative.   Eyes: Negative.   Respiratory: Negative.   Gastrointestinal: Negative.   Endocrine: Negative.   Genitourinary: Negative.   Musculoskeletal: Positive for gait problem.  Skin: Negative.   Allergic/Immunologic: Negative.   Neurological: Positive for weakness.  Hematological: Negative.   Psychiatric/Behavioral: Positive for confusion and hallucinations. The patient is nervous/anxious.   All other systems reviewed and are negative.      Objective:   Physical Exam General: No acute distress HEENT: EOMI, oral membranes moist Cards: reg rate  Chest: normal effort Abdomen: Soft, NT, ND Skin: dry, intact Extremities: no edema   Neurological.alert. Verbalizes usually with "yes" or unintelligible words.   Strength remains nearly 5 out of 5. He has resting tone in the right lower extremity at the knee and ankle which is about 1 out of 4. he has his w/c today Skin. craniotomysites clean Psych:Patient is pleasant and cooperative       Assessment & Plan:  1.Decreased functional mobilitysecondary to right subdural hematoma after fall complicated by acute hypoxic respiratory failure and stridor as well as history of glioblastoma and seizure disorder -continue with HEP as tolerated.  -remains a high fall risk with walker given balance and  impulsivity. -AFO for RLE might be helpful to better stabilize right knee and RLE -pt doing well with his manual chair. It has helped with mobility at home and in the community 2. Insomnia: seizure meds are sedating him but also causing some confusion. Will not add further medicaiton at this time.  3. Mood:Prozac 20 mg daily Seizure disorder. Vimpat 200 mg twice daily, Keppra 1500 mg twice daily. Follow-up neurology services Dr. Krista Blue    -no new seizures  -given confusion with meds, it may be worth asking neurology if a dose reduction is possible 4.Dysphagia:Doing well with current diet. 5.Hypothyroidism. Synthroid 6. Hearing Loss:Hearing aid/amplifier useful  -discussed using a communication board also. 7. Cholecystitis:Status post gallbladder surgery without any problems  15 minutes of direct patient care time was spent today.  I will see him back in about 6 months.

## 2019-03-15 ENCOUNTER — Other Ambulatory Visit: Payer: Self-pay | Admitting: Physical Medicine & Rehabilitation

## 2019-03-15 ENCOUNTER — Other Ambulatory Visit: Payer: Self-pay | Admitting: Family Medicine

## 2019-03-18 ENCOUNTER — Other Ambulatory Visit: Payer: Self-pay | Admitting: Physical Medicine & Rehabilitation

## 2019-03-27 ENCOUNTER — Other Ambulatory Visit: Payer: Self-pay

## 2019-03-27 DIAGNOSIS — Z923 Personal history of irradiation: Secondary | ICD-10-CM | POA: Insufficient documentation

## 2019-03-27 DIAGNOSIS — E291 Testicular hypofunction: Secondary | ICD-10-CM | POA: Insufficient documentation

## 2019-03-27 DIAGNOSIS — E038 Other specified hypothyroidism: Secondary | ICD-10-CM | POA: Insufficient documentation

## 2019-03-27 MED ORDER — LEVOTHYROXINE SODIUM 50 MCG PO TABS: 50.0000 ug | ORAL_TABLET | Freq: Every day | ORAL | 3 refills | Status: DC

## 2019-03-28 ENCOUNTER — Ambulatory Visit (INDEPENDENT_AMBULATORY_CARE_PROVIDER_SITE_OTHER): Payer: Medicare HMO | Admitting: Family Medicine

## 2019-03-28 ENCOUNTER — Encounter: Payer: Self-pay | Admitting: Family Medicine

## 2019-03-28 ENCOUNTER — Other Ambulatory Visit: Payer: Self-pay

## 2019-03-28 VITALS — BP 124/78 | HR 72 | Temp 98.2°F | Resp 16 | Ht 69.0 in

## 2019-03-28 DIAGNOSIS — E782 Mixed hyperlipidemia: Secondary | ICD-10-CM | POA: Diagnosis not present

## 2019-03-28 DIAGNOSIS — E038 Other specified hypothyroidism: Secondary | ICD-10-CM

## 2019-03-28 DIAGNOSIS — F411 Generalized anxiety disorder: Secondary | ICD-10-CM | POA: Insufficient documentation

## 2019-03-28 DIAGNOSIS — R69 Illness, unspecified: Secondary | ICD-10-CM | POA: Diagnosis not present

## 2019-03-28 DIAGNOSIS — E291 Testicular hypofunction: Secondary | ICD-10-CM | POA: Diagnosis not present

## 2019-03-28 MED ORDER — BUSPIRONE HCL 5 MG PO TABS: 5.0000 mg | ORAL_TABLET | Freq: Three times a day (TID) | ORAL | 2 refills | Status: DC

## 2019-03-28 MED ORDER — BUSPIRONE HCL 5 MG PO TABS: 5.0000 mg | ORAL_TABLET | Freq: Two times a day (BID) | ORAL | 2 refills | Status: DC

## 2019-03-28 NOTE — Patient Instructions (Signed)
Start buspirone 5 mg one twice a day for anxiety. Remain of fluoxetine.  Remain off Montelukast (singulair) as it seemed to be causing issues sleeping.  Continue loratadine for allergies.  Continue to eat heatlhy.  Continue to work on getting outside more! See you next time!

## 2019-03-28 NOTE — Progress Notes (Signed)
Subjective:  Patient ID: Glen [name], male    DOB: 02-03-1970  Age: 49 y.o. MRN: [id number]  Chief Complaint  Patient presents with  . Hyperlipidemia  . Hypothyroidism  . Seizures    HPI   Glen [name] presents with atrophy of thyroid (acquired).  Date of diagnosis 2010.  He is currently taking Synthroid, 50 mcg daily.  TSH was last checked 3 months ago.  The result was reported as normal ( 1.130 mU/L ).  Currently, he is experiencing excessive sweating.  He denies related symptoms such as cold intolerance, dry skin, fatigue, hair loss or weight gain.  He reports no symptoms suggestive of adverse medication effect.      Glen [name] presents with a diagnosis of testicular hypofunction.  This was diagnosed 10/2012.  The course has been stable and nonprogressive.  It is of moderate intensity.  Symptoms are relieved with Testosterone injections every 2 weeks.  Last Testosterone level on 09/14/2018 was low at 204.  Helps his energy level. No girlfriend.    Concerning other seizures, initial eval or follow-up Glen [name] is being seen in follow-up for a seizure disorder.  The precipitating event for the patient's seizure appears to be post neuro-surgery for brain tumor.  During the course of treatment there have been no new seizures.  Current seizure medications include Levetiracetam and Vimpat.  Notable symptoms (possible side effects) since initiation of therapy include none.  Compliance with treatment has been good with regard to patient taking medication as recommended, not bathing alone, and not driving a motorized vehicle.      Pt presents with hyperlipidemia.  Date of diagnosis 01/2013.  Current treatment includes Lipitor.  Compliance with treatment has been good; he takes his medication as directed, maintains his low cholesterol diet, and follows up as directed.  He denies experiencing any hypercholesterolemia related symptoms.  Concurrent health problems include hypothyroidism.  Takoing levothyroxine 50  mcg once daily in am.     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  This was diagnosed 11/2013.  The course has been stable and nonprogressive.  It is of moderate intensity.  Symptoms are relieved with Keppra 1500 mg BID and Vimpat 200 mg BID.  Associated symptoms include unsteady gait, speech problems, hearing loss and seizures.  He denies headache.  Being followed by Dr.Yan, neurologist.  No seizures.  Patient has anxiety. Stopped fluoxetine which made it worse. Stopped singulair because of pharmacy not delivery and he seemed to sleep better.  He denies chest pain, fever, headache, nausea or vomiting.  Has bowel and bladder control. Helping more to get dressed. Able to eat independently.     Social History   Socioeconomic History  . Marital status: Divorced    Spouse name: Not on file  . Number of children: 1  . Years of education: college  . Highest education level: Not on file  Occupational History  . Occupation: Disabled  Tobacco Use  . Smoking status: Never Smoker  . Smokeless tobacco: Never Used  Substance and Sexual Activity  . Alcohol use: No  . Drug use: Never  . Sexual activity: Not on file  Other Topics Concern  . Not on file  Social History Narrative   Lives at home with parents.   Right-handed.   Occasional caffeine.   Social Determinants of Health   Financial Resource Strain:   . Difficulty of Paying Living Expenses:   Food Insecurity:   . Worried About Charity fundraiser in  the Last Year:   . [place] in the Last Year:   Transportation Needs:   . Film/video editor (Medical):   Marland Kitchen Lack of Transportation (Non-Medical):   Physical Activity:   . Days of Exercise per Week:   . Minutes of Exercise per Session:   Stress:   . Feeling of Stress :   Social Connections:   . Frequency of Communication with Friends and Family:   . Frequency of Social Gatherings with Friends and Family:   . Attends Religious  Services:   . Active Member of Clubs or Organizations:   . Attends Archivist Meetings:   Marland Kitchen Marital Status:    Past Medical History:  Diagnosis Date  . Brain cancer ([place])   . Hiccough   . Hypercholesteremia   . Other elevated white blood cell count   . Other encephalitis and encephalomyelitis   . Posterior reversible encephalopathy syndrome   . Seizure ([employer])   . Stroke ([place])   . Testicular hypofunction   . Thyroid disorder    Family History  Adopted: Yes  Family history unknown: Yes    Review of Systems  Constitutional: Negative for chills, fatigue and fever.  HENT: Negative for congestion, ear pain, hearing loss and sore throat.   Eyes: Negative for visual disturbance.  Respiratory: Negative for cough and shortness of breath.   Cardiovascular: Negative for chest pain.  Gastrointestinal: Negative for abdominal pain, constipation, diarrhea, nausea and vomiting.  Endocrine: Negative for polydipsia, polyphagia and polyuria.  Genitourinary: Negative for dysuria and frequency.  Musculoskeletal: Negative for arthralgias, back pain and myalgias.  Neurological: Negative for dizziness and headaches.  Psychiatric/Behavioral: Negative for dysphoric mood.       No dysphoria   Objective:  BP 124/78   Pulse 72   Temp 98.2 F (36.8 C)   Resp 16   Ht 5\' 9"  (1.753 m)   BMI 30.27 kg/m   BP/Weight 03/28/2019 3/50/0938 01/25/2991  Systolic BP 716 967 893  Diastolic BP 78 69 85  Wt. (Lbs) - - -  BMI 30.27 - 25.62    Physical Exam Vitals reviewed.  Constitutional:      Appearance: Normal appearance. He is obese.  Neck:     Vascular: No carotid bruit.  Cardiovascular:     Rate and Rhythm: Normal rate and regular rhythm.     Pulses: Normal pulses.     Heart sounds: Normal heart sounds.  Pulmonary:     Effort: Pulmonary effort is normal.     Breath sounds: Normal breath sounds. No wheezing, rhonchi or rales.  Abdominal:     General: Bowel sounds are normal.      Palpations: Abdomen is soft.     Tenderness: There is no abdominal tenderness.  Musculoskeletal:     Comments: In wheelchair.  Skin:    General: Skin is warm and dry.  Neurological:     Mental Status: He is alert.  Psychiatric:        Mood and Affect: Mood normal.        Behavior: Behavior normal.     Lab Results  Component Value Date   WBC 5.4 05/08/2017   HGB 12.2 (L) 05/08/2017   HCT 37.1 (L) 05/08/2017   PLT 208 05/08/2017   GLUCOSE 99 05/15/2017   TRIG 73 04/13/2017   ALT 22 04/25/2017   AST 19 04/25/2017   NA 138 05/15/2017   K 4.2 05/15/2017   CL 100 (L) 05/15/2017  CREATININE 0.87 05/15/2017   BUN 9 05/15/2017   CO2 32 05/15/2017   TSH 0.202 (L) 01/12/2014   INR 0.74 04/10/2017   HGBA1C 5.5 12/24/2013      Assessment & Plan:  Mixed hyperlipidemia Well controlled.  No changes to medicines.  Continue to work on eating a healthy diet and exercise.  Labs drawn today.   GAD (generalized anxiety disorder) Fairly well controlled. Continue fluoxetine. Add buspirone 5 mg one twice a day.  Testosterone deficiency in male Check levels.   Secondary hypothyroidism Well controlled.  The current medical regimen is effective;  continue present plan and medications.   Meds ordered this encounter  Medications  . DISCONTD: busPIRone (BUSPAR) 5 MG tablet    Sig: Take 1 tablet (5 mg total) by mouth 3 (three) times daily.    Dispense:  60 tablet    Refill:  2  . busPIRone (BUSPAR) 5 MG tablet    Sig: Take 1 tablet (5 mg total) by mouth 2 (two) times daily.    Dispense:  60 tablet    Refill:  2      Orders Placed This Encounter  Procedures  . CBC with Differential/Platelet  . Comprehensive metabolic panel  . Lipid panel  . TSH  . Testosterone,Free and Total   Follow-up: Return in about 3 months (around 06/28/2019).  AVS was given to patient prior to departure.  [place] [phone].

## 2019-03-29 ENCOUNTER — Other Ambulatory Visit: Payer: Self-pay

## 2019-03-31 ENCOUNTER — Encounter: Payer: Self-pay | Admitting: Family Medicine

## 2019-03-31 NOTE — Assessment & Plan Note (Signed)
Well controlled. Continue fluoxetine.

## 2019-03-31 NOTE — Assessment & Plan Note (Signed)
Well controlled.  ?No changes to medicines.  ?Continue to work on eating a healthy diet and exercise.  ?Labs drawn today.  ?

## 2019-03-31 NOTE — Assessment & Plan Note (Signed)
Check levels 

## 2019-03-31 NOTE — Assessment & Plan Note (Signed)
Well controlled.  The current medical regimen is effective;  continue present plan and medications.

## 2019-04-01 ENCOUNTER — Other Ambulatory Visit: Payer: Self-pay | Admitting: Family Medicine

## 2019-04-01 LAB — LIPID PANEL
Chol/HDL Ratio: 5.5 ratio — ABNORMAL HIGH (ref 0.0–5.0)
Cholesterol, Total: 153 mg/dL (ref 100–199)
HDL: 28 mg/dL — ABNORMAL LOW (ref >39)
LDL Chol Calc (NIH): 86 mg/dL (ref 0–99)
Triglycerides: 228 mg/dL — ABNORMAL HIGH (ref 0–149)
VLDL Cholesterol Cal: 39 mg/dL (ref 5–40)

## 2019-04-01 LAB — CBC WITH DIFFERENTIAL/PLATELET
Basophils Absolute: 0 10*3/uL (ref 0.0–0.2)
Basos: 0 %
EOS (ABSOLUTE): 0.2 10*3/uL (ref 0.0–0.4)
Eos: 2 %
Hematocrit: 49.9 % (ref 37.5–51.0)
Hemoglobin: 16.5 g/dL (ref 13.0–17.7)
Immature Grans (Abs): 0.1 10*3/uL (ref 0.0–0.1)
Immature Granulocytes: 1 %
Lymphocytes Absolute: 2.2 10*3/uL (ref 0.7–3.1)
Lymphs: 31 %
MCH: 29.5 pg (ref 26.6–33.0)
MCHC: 33.1 g/dL (ref 31.5–35.7)
MCV: 89 fL (ref 79–97)
Monocytes Absolute: 0.7 10*3/uL (ref 0.1–0.9)
Monocytes: 9 %
Neutrophils Absolute: 4 10*3/uL (ref 1.4–7.0)
Neutrophils: 57 %
Platelets: 201 10*3/uL (ref 150–450)
RBC: 5.6 x10E6/uL (ref 4.14–5.80)
RDW: 14.4 % (ref 11.6–15.4)
WBC: 7.1 10*3/uL (ref 3.4–10.8)

## 2019-04-01 LAB — COMPREHENSIVE METABOLIC PANEL
ALT: 17 IU/L (ref 0–44)
AST: 24 IU/L (ref 0–40)
Albumin/Globulin Ratio: 1.9 (ref 1.2–2.2)
Albumin: 4.6 g/dL (ref 4.0–5.0)
Alkaline Phosphatase: 72 IU/L (ref 39–117)
BUN/Creatinine Ratio: 7 — ABNORMAL LOW (ref 9–20)
BUN: 8 mg/dL (ref 6–24)
Bilirubin Total: 0.7 mg/dL (ref 0.0–1.2)
CO2: 27 mmol/L (ref 20–29)
Calcium: 9.4 mg/dL (ref 8.7–10.2)
Chloride: 99 mmol/L (ref 96–106)
Creatinine, Ser: 1.16 mg/dL (ref 0.76–1.27)
GFR calc Af Amer: 85 mL/min/{1.73_m2} (ref >59)
GFR calc non Af Amer: 74 mL/min/{1.73_m2} (ref >59)
Globulin, Total: 2.4 g/dL (ref 1.5–4.5)
Glucose: 78 mg/dL (ref 65–99)
Potassium: 4.4 mmol/L (ref 3.5–5.2)
Sodium: 139 mmol/L (ref 134–144)
Total Protein: 7 g/dL (ref 6.0–8.5)

## 2019-04-01 LAB — TESTOSTERONE,FREE AND TOTAL
Testosterone, Free: 12 pg/mL (ref 6.8–21.5)
Testosterone: 824 ng/dL (ref 264–916)

## 2019-04-01 LAB — CARDIOVASCULAR RISK ASSESSMENT

## 2019-04-01 LAB — TSH: TSH: 2.61 u[IU]/mL (ref 0.450–4.500)

## 2019-04-01 MED ORDER — LEVOTHYROXINE SODIUM 50 MCG PO TABS: 50.0000 ug | ORAL_TABLET | Freq: Every day | ORAL | 1 refills | Status: DC

## 2019-04-01 MED ORDER — LEVOTHYROXINE SODIUM 50 MCG PO TABS: 50.0000 ug | ORAL_TABLET | Freq: Every day | ORAL | 0 refills | Status: DC

## 2019-04-18 DIAGNOSIS — R569 Unspecified convulsions: Secondary | ICD-10-CM | POA: Diagnosis not present

## 2019-04-18 DIAGNOSIS — G40909 Epilepsy, unspecified, not intractable, without status epilepticus: Secondary | ICD-10-CM | POA: Diagnosis not present

## 2019-04-18 DIAGNOSIS — R1310 Dysphagia, unspecified: Secondary | ICD-10-CM | POA: Diagnosis not present

## 2019-04-18 DIAGNOSIS — S065X9A Traumatic subdural hemorrhage with loss of consciousness of unspecified duration, initial encounter: Secondary | ICD-10-CM | POA: Diagnosis not present

## 2019-04-18 IMAGING — CT CT HEAD W/O CM
4 series · 16 of 47 positions shown, 18 images · non-contrast
Comparison: Multiple priors, most recent 04/09/2017.

CLINICAL DATA: Subdural hemorrhage follow-up.

EXAM:
CT HEAD WITHOUT CONTRAST
TECHNIQUE: Contiguous axial images were obtained from the base of the skull
through the vertex without intravenous contrast.

[Series 3: head without · axial · non-contrast · 0.48mm/px · z∈[-148,-33]mm · 7 of 31 slices shown, 9 images]
[im 4/31  brain]
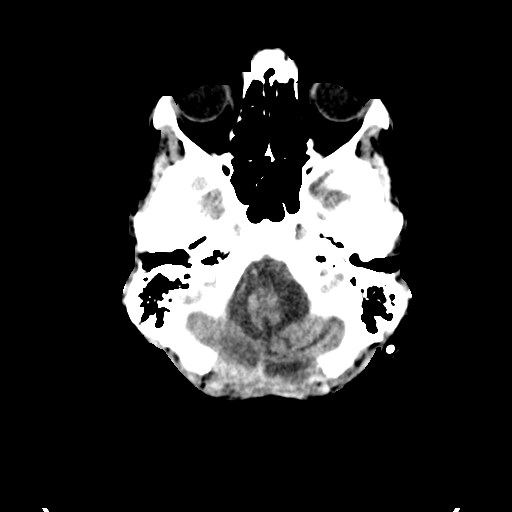
[im 4/31  bone]
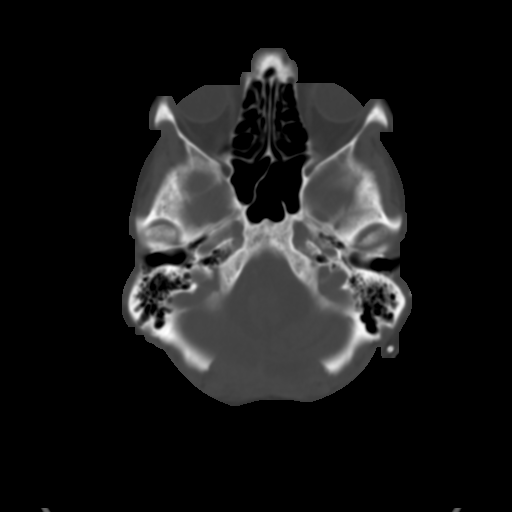
[im 8/31  brain]
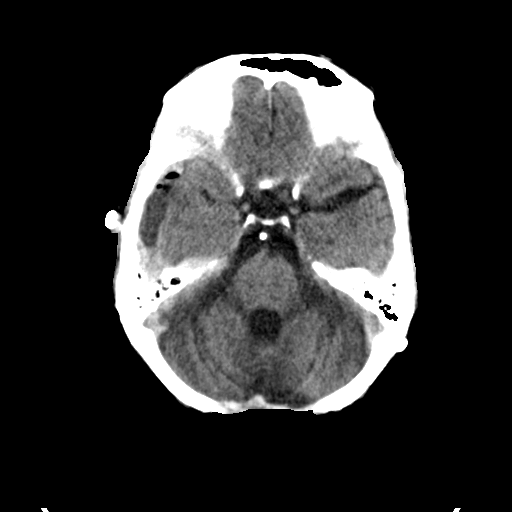
[im 12/31  brain]
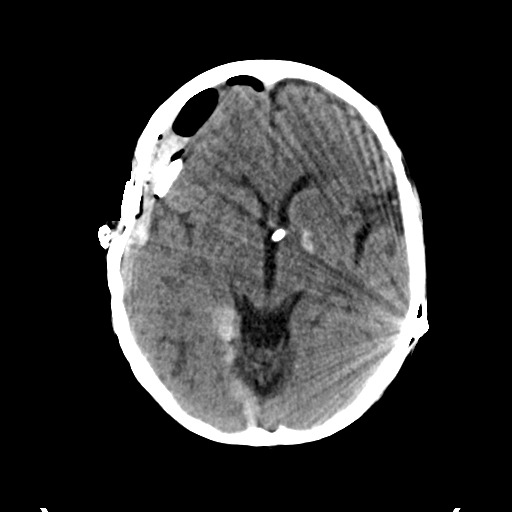
[im 16/31  brain]
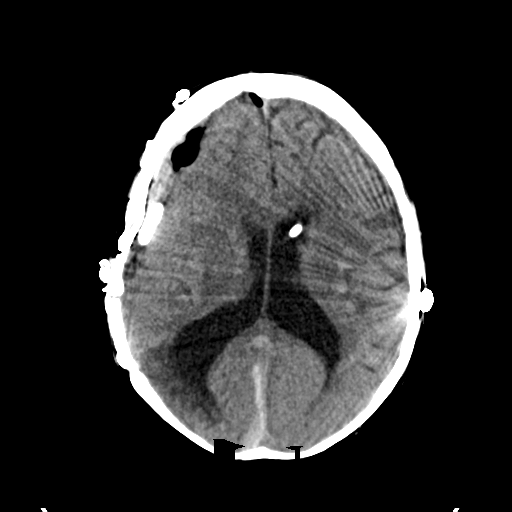
[im 19/31  brain]
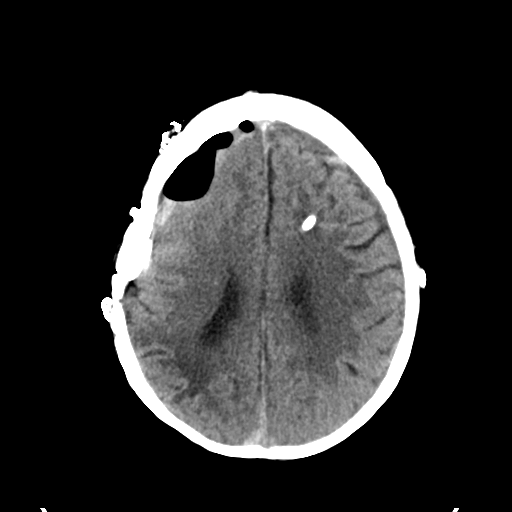
[im 19/31  bone]
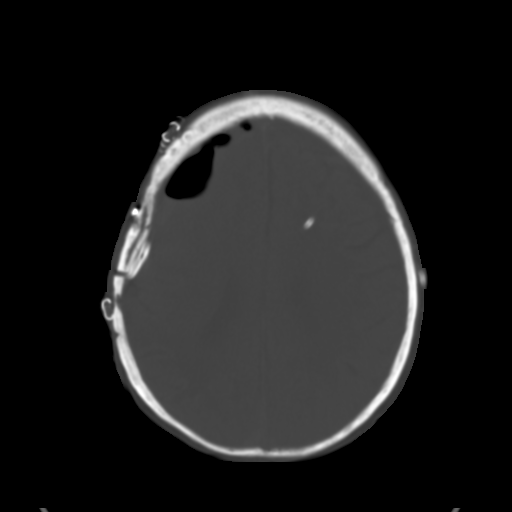
[im 23/31  brain]
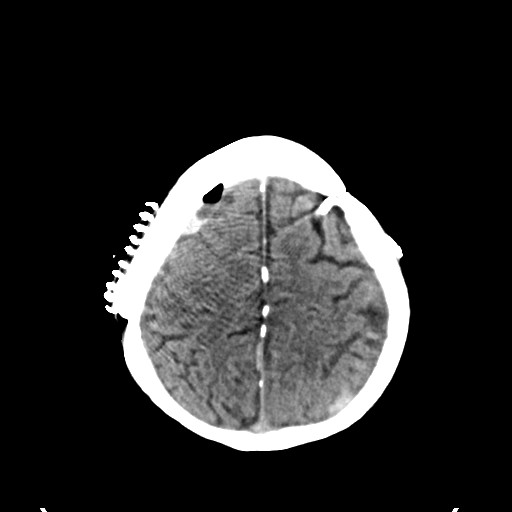
[im 27/31  brain]
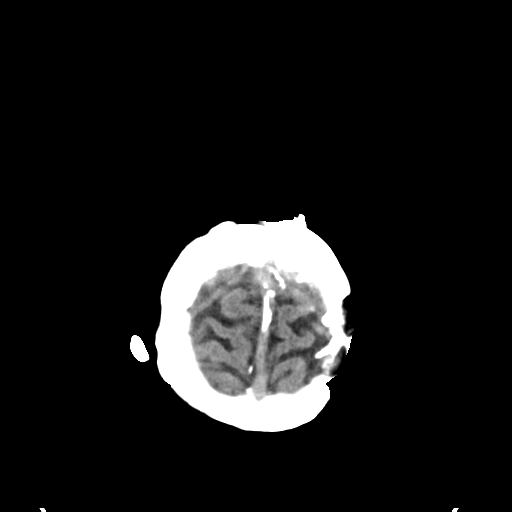

[Series 4: head bone · axial · 0.48mm/px · z∈[-149,-119]mm · 3 of 77 slices shown]
[im 8/77  bone]
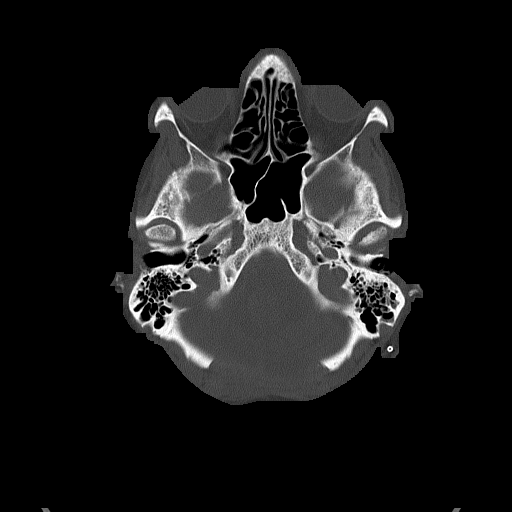
[im 16/77  bone]
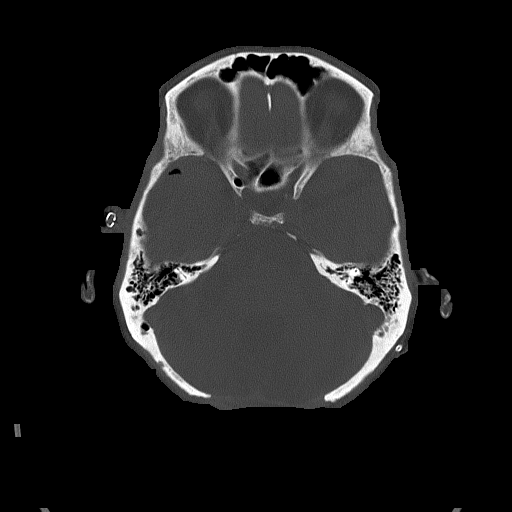
[im 23/77  bone]
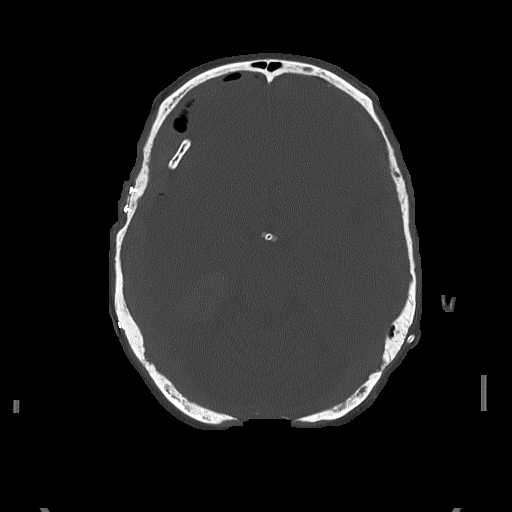

[Series 5: head without cor · coronal · non-contrast · 0.32mm/px · 3 of 77 slices shown]
[im 26/77  brain]
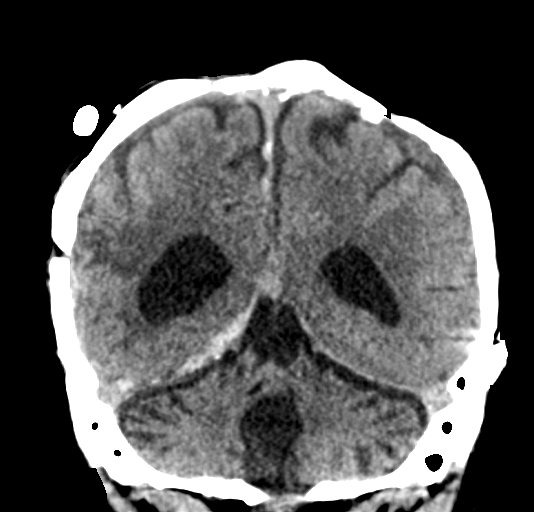
[im 34/77  brain]
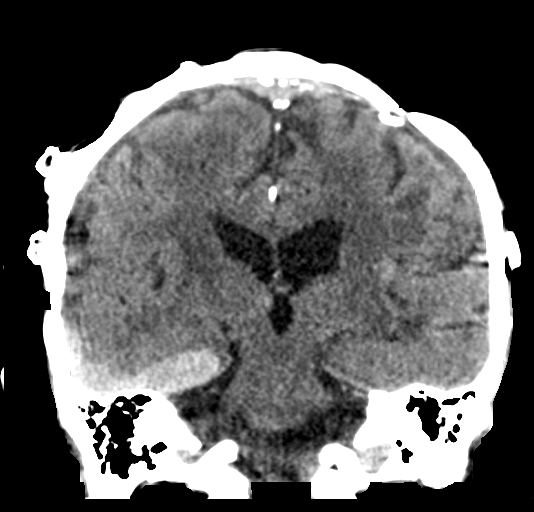
[im 43/77  brain]
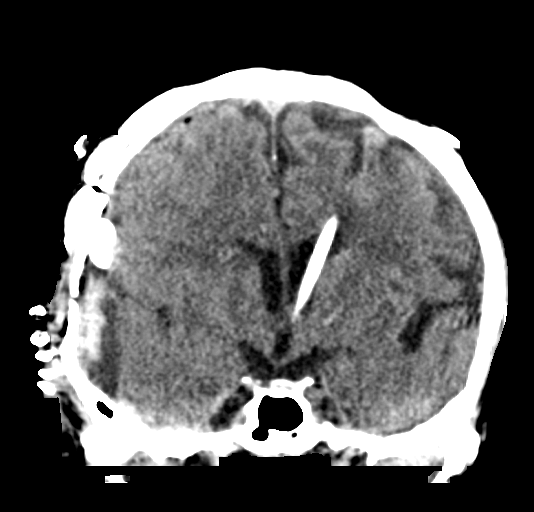

[Series 6: head without sag · sagittal · non-contrast · 0.33mm/px · 3 of 63 slices shown]
[im 21/63  brain]
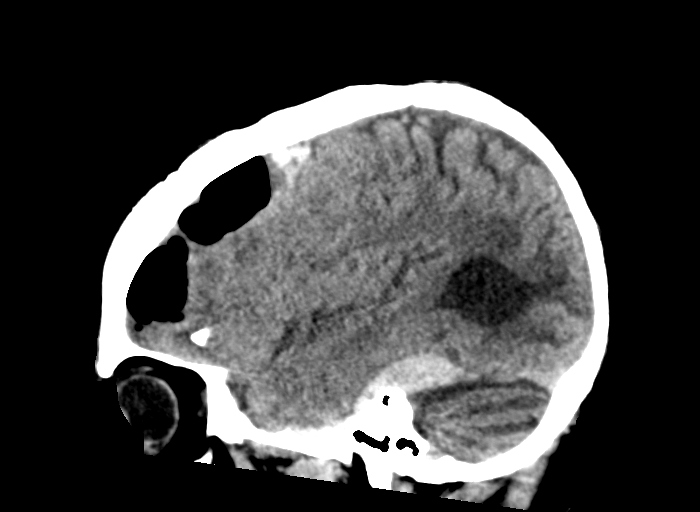
[im 32/63  brain]
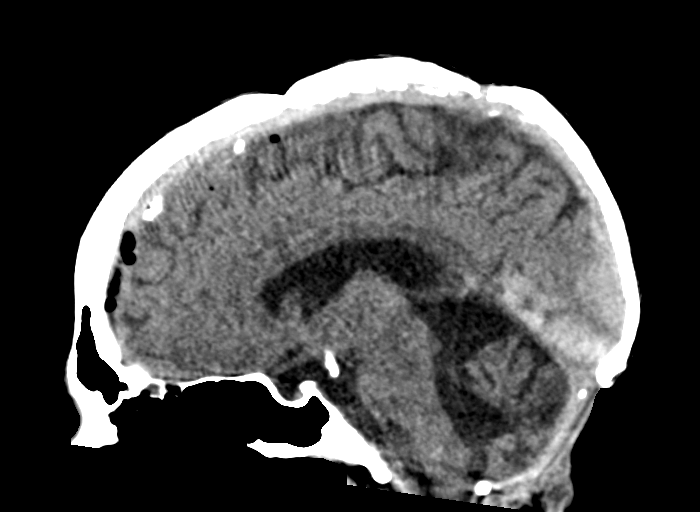
[im 42/63  brain]
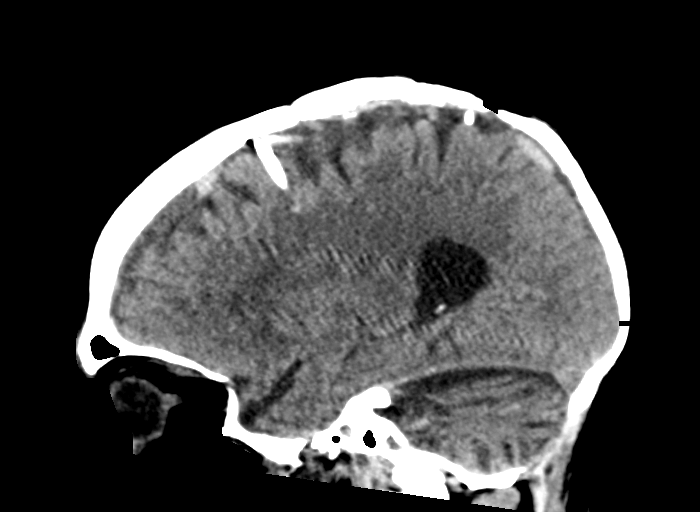

[16 of 47 positions shown; findings below may reference images not displayed]

FINDINGS: Brain: Patient is status post RIGHT craniotomy and evacuation of
subdural. Subdural drain has been left in place. There is decreased
residual extra-axial collection on the RIGHT, mixed attenuation,
with some superimposed pneumocephalus, measuring up to 10 mm thick
on image 16 series 3. Largest residual collection is over the
frontal lobe. Tentorial collection on the RIGHT redemonstrated and
stable. Small extra-axial hygroma on the LEFT, stable. Stable
parenchymal calcification, LEFT basal ganglia. No significant
midline shift.

LEFT ventricular catheter unchanged in position.

Vascular:   No signs of intracranial large vessel occlusion.

Skull: Craniotomy/craniectomy defects, most notable posterior fossa
midline. Acute RIGHT-sided operative site appears uncomplicated.

Sinuses/Orbits: No sinus or mastoid disease.

Other: None.
IMPRESSION: Improved appearance status post RIGHT craniotomy for acute subdural
evacuation.

Residual 10 mm thick extra-axial collection over the RIGHT
convexity, with a subdural drain in place along with pneumocephalus.
Continued surveillance is warranted.

## 2019-04-18 IMAGING — DX DG ABD PORTABLE 1V
1 series · 1 of 1 positions shown · non-contrast
Comparison: None in PACs

CLINICAL DATA: Status post orogastric tube placement

EXAM:
PORTABLE ABDOMEN - 1 VIEW

[abdomen]
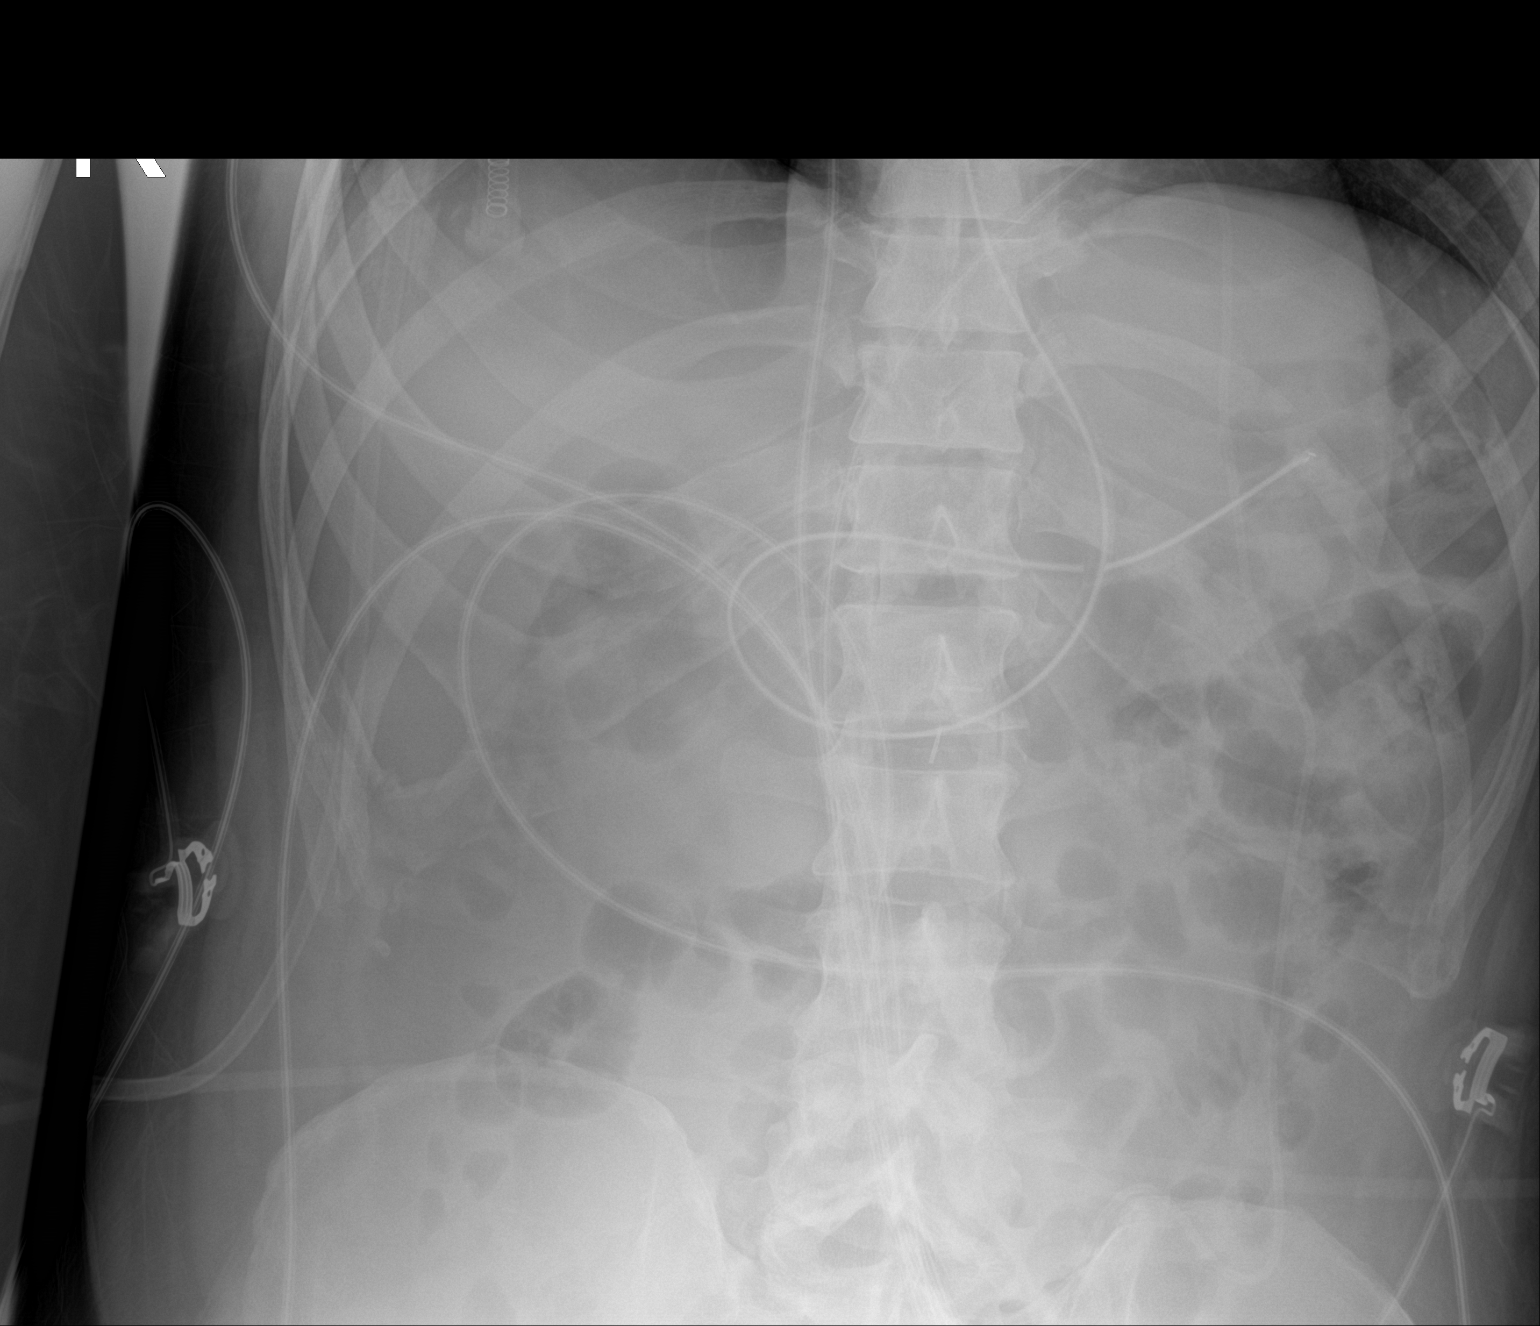

[1 of 1 positions shown; findings below may reference images not displayed]

FINDINGS: The orogastric tube tip and proximal port lie in the gastric body.
The bowel gas pattern does not suggest obstruction.
IMPRESSION: Reasonable positioning of the orogastric tube with both proximal
port and tip lie in the gastric body.

## 2019-05-04 ENCOUNTER — Other Ambulatory Visit: Payer: Self-pay | Admitting: Family Medicine

## 2019-06-25 ENCOUNTER — Telehealth: Payer: Self-pay

## 2019-06-25 ENCOUNTER — Other Ambulatory Visit: Payer: Self-pay | Admitting: Physical Medicine & Rehabilitation

## 2019-06-25 NOTE — Telephone Encounter (Signed)
Refill request for Vimpat received.  I called patient to verify if he has seen by his Neurologist. Per Mother Glen [name]) he has not. Next appointment is in September.  However she did say that the medicine maybe causing him to be violent.  Mrs. Glen [name] has been advised to call the Neuro today. So the appointment can be moved up.

## 2019-06-25 NOTE — Telephone Encounter (Signed)
Pt's mother, [name], per DPR, called and reported that pt has been exhibiting strange behaviors that his family call "hallucinations" afterf his vimpat doses. They are wondering if these behaviors are caused by vimpat and want to discuss this with Judson Roch, NP and are requesting a sooner appt. They have an appt with pt's PCP next week as well to discuss. They have also discussed these behaviors with Dr. Charm Barges office and was told to make a sooner appt with neuro. Appt has been scheduled for 07/08/19 at 2:15pm with Judson Roch, NP, check in at 1:45pm. Pt's mother verbalized understanding of new appt date and time.

## 2019-07-04 ENCOUNTER — Ambulatory Visit (INDEPENDENT_AMBULATORY_CARE_PROVIDER_SITE_OTHER): Payer: Medicare HMO | Admitting: Family Medicine

## 2019-07-04 ENCOUNTER — Other Ambulatory Visit: Payer: Self-pay

## 2019-07-04 ENCOUNTER — Encounter: Payer: Self-pay | Admitting: Family Medicine

## 2019-07-04 VITALS — BP 110/78 | HR 72 | Temp 97.2°F | Resp 16 | Ht 69.0 in

## 2019-07-04 DIAGNOSIS — Z6830 Body mass index (BMI) 30.0-30.9, adult: Secondary | ICD-10-CM

## 2019-07-04 DIAGNOSIS — G40909 Epilepsy, unspecified, not intractable, without status epilepticus: Secondary | ICD-10-CM

## 2019-07-04 DIAGNOSIS — F411 Generalized anxiety disorder: Secondary | ICD-10-CM

## 2019-07-04 DIAGNOSIS — E782 Mixed hyperlipidemia: Secondary | ICD-10-CM | POA: Diagnosis not present

## 2019-07-04 DIAGNOSIS — R69 Illness, unspecified: Secondary | ICD-10-CM | POA: Diagnosis not present

## 2019-07-04 DIAGNOSIS — E038 Other specified hypothyroidism: Secondary | ICD-10-CM | POA: Diagnosis not present

## 2019-07-04 DIAGNOSIS — R269 Unspecified abnormalities of gait and mobility: Secondary | ICD-10-CM | POA: Diagnosis not present

## 2019-07-04 MED ORDER — FLUOXETINE HCL 40 MG PO CAPS: 40.0000 mg | ORAL_CAPSULE | Freq: Two times a day (BID) | ORAL | 0 refills | Status: DC

## 2019-07-04 NOTE — Progress Notes (Signed)
Established Patient Office Visit  Subjective:  Patient ID: Glen [name], male    DOB: 1970/05/17  Age: 49 y.o. MRN: [id number]  CC: No chief complaint on file.   HPI Glen [name] presents for follow up of chronic medical issues with his Mother, Pamala Hurry, and brother, Wille Glaser.  Glen [name] presents with atrophy of thyroid (acquired).  Date of diagnosis 2010.  He is currently taking Synthroid, 50 mcg daily.  TSH was last checked 3 months ago.  The result was reported as normal ( 2.610 mU/L ).      Concerning other seizures, Glen [name] is being seen in follow-up for a seizure disorder.  The precipitating event for the patient's seizure was post neuro-surgery for brain tumor.  During the course of treatment there have been no new seizures.  Current seizure medications include Levetiracetam and Vimpat.  He has had increased agitation.    Pt presents with hyperlipidemia.  Date of diagnosis 01/2013.  Current treatment includes Lipitor 20 mg once daily at night. He has a good appetite and eats a low cholesterol diet, and follows up as directed.     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  This was diagnosed 11/2013.  The course has been stable and nonprogressive.  It is of moderate intensity.  Symptoms are relieved with Keppra 1500 mg BID and Vimpat 200 mg BID.  Associated symptoms include unsteady gait, speech problems, hearing loss and seizures.  He denies headache.  Being followed by Dr.Yan, neurologist.  No seizures.   Patient has anxiety and has felt depressed per his mother and brother. He is nonverbal and misunderstands my directions. He is alert.   Past Medical History:  Diagnosis Date  . Brain cancer ([place])   . Hiccough   . Hypercholesteremia   . Other elevated white blood cell count   . Other encephalitis and encephalomyelitis   . Posterior reversible encephalopathy syndrome   . Seizure ([place])   . Stroke ([place])   . Testicular hypofunction   . Thyroid  disorder     Past Surgical History:  Procedure Laterality Date  . APPENDECTOMY     1980s  . BRAIN SURGERY     1993  . CRANIOTOMY Right 04/09/2017   Procedure: CRANIOTOMY HEMATOMA EVACUATION SUBDURAL;  Surgeon: Erline Levine, MD;  Location: [facility];  Service: Neurosurgery;  Laterality: Right;  . DIRECT LARYNGOSCOPY N/A 04/09/2017   Procedure: DIRECT LARYNGOSCOPY;  Surgeon: Erline Levine, MD;  Location: [facility];  Service: Neurosurgery;  Laterality: N/A;  . DIRECT LARYNGOSCOPY N/A 04/09/2017   Procedure: DIRECT LARYNGOSCOPY;  Surgeon: [name], MD;  Location: [facility];  Service: ENT;  Laterality: N/A;  . DIRECT LARYNGOSCOPY N/A 04/18/2017   Procedure: DIRECT LARYNGOSCOPY;  Surgeon: [name], MD;  Location: [facility];  Service: ENT;  Laterality: N/A;  . EYE SURGERY     X5  . TRACHEOSTOMY TUBE PLACEMENT N/A 04/18/2017   Procedure: TRANSNASAL FIBER OPTIC LARYNGOSCOPY;  Surgeon: [name], MD;  Location: [facility];  Service: ENT;  Laterality: N/A;    Family History  Adopted: Yes  Family history unknown: Yes    Social History   Socioeconomic History  . Marital status: Divorced    Spouse name: Not on file  . Number of children: 1  . Years of education: college  . Highest education level: Not on file  Occupational History  . Occupation: Disabled  Tobacco Use  . Smoking status: Never Smoker  . Smokeless tobacco: Never Used  Vaping Use  . Vaping Use: Never used  Substance and Sexual Activity  . Alcohol use: No  . Drug use: Never  . Sexual activity: Not on file  Other Topics Concern  . Not on file  Social History Narrative   Lives at home with parents.   Right-handed.   Occasional caffeine.   Social Determinants of Health   Financial Resource Strain:   . Difficulty of Paying Living Expenses:   Food Insecurity:   . Worried About Charity fundraiser in the Last Year:   . Arboriculturist in the Last Year:   Transportation Needs:   . Film/video editor (Medical):   Marland Kitchen Lack of  Transportation (Non-Medical):   Physical Activity:   . Days of Exercise per Week:   . Minutes of Exercise per Session:   Stress:   . Feeling of Stress :   Social Connections:   . Frequency of Communication with Friends and Family:   . Frequency of Social Gatherings with Friends and Family:   . Attends Religious Services:   . Active Member of Clubs or Organizations:   . Attends Archivist Meetings:   Marland Kitchen Marital Status:   Intimate Partner Violence:   . Fear of Current or Ex-Partner:   . Emotionally Abused:   Marland Kitchen Physically Abused:   . Sexually Abused:     Outpatient Medications Prior to Visit  Medication Sig Dispense Refill  . atorvastatin (LIPITOR) 20 MG tablet TAKE 1 TABLET BY MOUTH EVERY DAY 90 tablet 0  . busPIRone (BUSPAR) 5 MG tablet TAKE 1 TABLET BY MOUTH TWICE A DAY 180 tablet 1  . levETIRAcetam (KEPPRA) 100 MG/ML solution Take 15 mLs (1,500 mg total) by mouth 2 (two) times daily. 1000 mL 11  . levothyroxine (SYNTHROID) 50 MCG tablet Take 1 tablet (50 mcg total) by mouth daily before breakfast. 90 tablet 1  . loratadine (CLARITIN) 10 MG tablet Take 10 mg by mouth daily.  1  . polyethylene glycol (MIRALAX / GLYCOLAX) packet Take 17 g by mouth daily. 14 each 0  . testosterone cypionate (DEPOTESTOSTERONE CYPIONATE) 200 MG/ML injection Inject 1 mL into the muscle every 14 (fourteen) days.    Marland Kitchen VIMPAT 200 MG TABS tablet TAKE 1 TABLET TWICE A DAY (Patient not taking: Reported on 07/04/2019) 60 tablet 2  . FLUoxetine (PROZAC) 40 MG capsule Take 40 mg by mouth daily.    . montelukast (SINGULAIR) 10 MG tablet TAKE 1 TABLET BY MOUTH EVERY DAY 90 tablet 0   No facility-administered medications prior to visit.    Allergies  Allergen Reactions  . Heparin Anaphylaxis    Patient has HITT  . Lidocaine Hives  . Quetiapine Other (See Comments)    Confusion  . Depakote [Divalproex Sodium] Rash  . Phenytoin Sodium Extended Rash    ROS Review of Systems  Constitutional:  Positive for fatigue. Negative for chills and fever.  HENT: Negative for rhinorrhea.   Respiratory: Negative for cough and shortness of breath.   Cardiovascular: Negative for chest pain.  Gastrointestinal: Negative for abdominal pain and vomiting.  Neurological: Positive for weakness.  Psychiatric/Behavioral: Positive for agitation. The patient is nervous/anxious.       Objective:    Physical Exam Vitals reviewed.  Constitutional:      Appearance: He is well-developed. He is obese.  Cardiovascular:     Rate and Rhythm: Normal rate and regular rhythm.     Heart sounds: Normal heart sounds.  Pulmonary:  Effort: Pulmonary effort is normal.     Breath sounds: Normal breath sounds.  Abdominal:     General: Bowel sounds are normal.     Palpations: Abdomen is soft.     Tenderness: There is no abdominal tenderness.  Musculoskeletal:     Comments: Ataxia with standing.   Neurological:     Mental Status: He is alert. Mental status is at baseline.     Gait: Gait abnormal.  Psychiatric:     Comments: Seems agitated.     BP 110/78   Pulse 72   Temp (!) 97.2 F (36.2 C)   Resp 16   Ht 5\' 9"  (1.753 m)   BMI 30.27 kg/m  Wt Readings from Last 3 Encounters:  09/05/18 205 lb (93 kg)  03/26/18 206 lb (93.4 kg)  10/09/17 192 lb (87.1 kg)     Health Maintenance Due  Topic Date Due  . Hepatitis C Screening  Never done  . COVID-19 Vaccine (1) Never done  . HIV Screening  Never done  . TETANUS/TDAP  Never done    There are no preventive care reminders to display for this patient.  Lab Results  Component Value Date   TSH 2.610 03/28/2019   Lab Results  Component Value Date   WBC 7.1 03/28/2019   HGB 16.5 03/28/2019   HCT 49.9 03/28/2019   MCV 89 03/28/2019   PLT 201 03/28/2019   Lab Results  Component Value Date   NA 139 03/28/2019   K 4.4 03/28/2019   CO2 27 03/28/2019   GLUCOSE 78 03/28/2019   BUN 8 03/28/2019   CREATININE 1.16 03/28/2019   BILITOT 0.7  03/28/2019   ALKPHOS 72 03/28/2019   AST 24 03/28/2019   ALT 17 03/28/2019   PROT 7.0 03/28/2019   ALBUMIN 4.6 03/28/2019   CALCIUM 9.4 03/28/2019   ANIONGAP 6 05/15/2017   Lab Results  Component Value Date   CHOL 153 03/28/2019   Lab Results  Component Value Date   HDL 28 (L) 03/28/2019   Lab Results  Component Value Date   LDLCALC 86 03/28/2019   Lab Results  Component Value Date   TRIG 228 (H) 03/28/2019   Lab Results  Component Value Date   CHOLHDL 5.5 (H) 03/28/2019   Lab Results  Component Value Date   HGBA1C 5.5 12/24/2013      Assessment & Plan:  1. Mixed hyperlipidemia Well controlled.  No changes to medicines.  Continue to work on eating a healthy diet and exercise.  Labs drawn today.  - CBC with Differential/Platelet - Comprehensive metabolic panel - Lipid panel  2. Secondary hypothyroidism The current medical regimen is effective;  continue present plan and medications.  3. Neurologic gait disorder Stable.  4. Seizure disorder ([place]) Mgmt by neurology.  5. GAD (generalized anxiety disorder) Increase prozac 40 mg 2 daily.  6. BMI 30.0-30.9,adult  Meds ordered this encounter  Medications  . FLUoxetine (PROZAC) 40 MG capsule    Sig: Take 1 capsule (40 mg total) by mouth 2 (two) times daily.    Dispense:  180 capsule    Refill:  0    Follow-up: Return in about 3 months (around 10/04/2019) for fasting.    Rochel Brome, MD

## 2019-07-05 LAB — LIPID PANEL
Chol/HDL Ratio: 5 ratio (ref 0.0–5.0)
Cholesterol, Total: 150 mg/dL (ref 100–199)
HDL: 30 mg/dL — ABNORMAL LOW (ref >39)
LDL Chol Calc (NIH): 92 mg/dL (ref 0–99)
Triglycerides: 162 mg/dL — ABNORMAL HIGH (ref 0–149)
VLDL Cholesterol Cal: 28 mg/dL (ref 5–40)

## 2019-07-05 LAB — COMPREHENSIVE METABOLIC PANEL
ALT: 20 IU/L (ref 0–44)
AST: 24 IU/L (ref 0–40)
Albumin/Globulin Ratio: 2.2 (ref 1.2–2.2)
Albumin: 4.4 g/dL (ref 4.0–5.0)
Alkaline Phosphatase: 67 IU/L (ref 48–121)
BUN/Creatinine Ratio: 10 (ref 9–20)
BUN: 11 mg/dL (ref 6–24)
Bilirubin Total: 0.6 mg/dL (ref 0.0–1.2)
CO2: 23 mmol/L (ref 20–29)
Calcium: 9 mg/dL (ref 8.7–10.2)
Chloride: 103 mmol/L (ref 96–106)
Creatinine, Ser: 1.1 mg/dL (ref 0.76–1.27)
GFR calc Af Amer: 91 mL/min/{1.73_m2} (ref >59)
GFR calc non Af Amer: 78 mL/min/{1.73_m2} (ref >59)
Globulin, Total: 2 g/dL (ref 1.5–4.5)
Glucose: 77 mg/dL (ref 65–99)
Potassium: 4.2 mmol/L (ref 3.5–5.2)
Sodium: 141 mmol/L (ref 134–144)
Total Protein: 6.4 g/dL (ref 6.0–8.5)

## 2019-07-05 LAB — CBC WITH DIFFERENTIAL/PLATELET
Basophils Absolute: 0 10*3/uL (ref 0.0–0.2)
Basos: 1 %
EOS (ABSOLUTE): 0.2 10*3/uL (ref 0.0–0.4)
Eos: 3 %
Hematocrit: 47.8 % (ref 37.5–51.0)
Hemoglobin: 15.9 g/dL (ref 13.0–17.7)
Immature Grans (Abs): 0.1 10*3/uL (ref 0.0–0.1)
Immature Granulocytes: 1 %
Lymphocytes Absolute: 2.2 10*3/uL (ref 0.7–3.1)
Lymphs: 29 %
MCH: 29.2 pg (ref 26.6–33.0)
MCHC: 33.3 g/dL (ref 31.5–35.7)
MCV: 88 fL (ref 79–97)
Monocytes Absolute: 0.6 10*3/uL (ref 0.1–0.9)
Monocytes: 8 %
Neutrophils Absolute: 4.5 10*3/uL (ref 1.4–7.0)
Neutrophils: 58 %
Platelets: 183 10*3/uL (ref 150–450)
RBC: 5.44 x10E6/uL (ref 4.14–5.80)
RDW: 13.6 % (ref 11.6–15.4)
WBC: 7.5 10*3/uL (ref 3.4–10.8)

## 2019-07-05 LAB — CARDIOVASCULAR RISK ASSESSMENT

## 2019-07-08 ENCOUNTER — Encounter: Payer: Self-pay | Admitting: Neurology

## 2019-07-08 ENCOUNTER — Ambulatory Visit (INDEPENDENT_AMBULATORY_CARE_PROVIDER_SITE_OTHER): Payer: Medicare HMO | Admitting: Neurology

## 2019-07-08 VITALS — BP 100/66 | HR 66

## 2019-07-08 DIAGNOSIS — G40909 Epilepsy, unspecified, not intractable, without status epilepticus: Secondary | ICD-10-CM | POA: Diagnosis not present

## 2019-07-08 MED ORDER — LAMOTRIGINE 25 MG PO CHEW: CHEWABLE_TABLET | ORAL | 0 refills | Status: DC

## 2019-07-08 MED ORDER — LAMOTRIGINE 100 MG PO TBDP: 100.0000 mg | ORAL_TABLET | Freq: Two times a day (BID) | ORAL | 11 refills | Status: DC

## 2019-07-08 NOTE — Patient Instructions (Addendum)
Stay off the Vimpat  Continue the [place] Start Lamictal, working up to 100 mg twice daily, using 25 mg tablets 1 tablet twice a day for the first week 2 tablets twice a day for the second week 3 tablets twice a day for the third week  After finish titration with small dose of lamotrigine 25 mg, change to lamotrigine 100 mg twice a day  Drink plenty of water, watch for side effect of rash  See you back in 3 months   Lamotrigine tablets What is this medicine? LAMOTRIGINE (la MOE Hendricks Limes) is used to control seizures in adults and children with epilepsy and Lennox-Gastaut syndrome. It is also used in adults to treat bipolar disorder. This medicine may be used for other purposes; ask your health care provider or pharmacist if you have questions. COMMON BRAND NAME(S): Lamictal, Subvenite What should I tell my health care provider before I take this medicine? They need to know if you have any of these conditions:  aseptic meningitis during prior use of lamotrigine  depression  folate deficiency  kidney disease  liver disease  suicidal thoughts, plans, or attempt; a previous suicide attempt by you or a family member  an unusual or allergic reaction to lamotrigine or other seizure medications, other medicines, foods, dyes, or preservatives  pregnant or trying to get pregnant  breast-feeding How should I use this medicine? Take this medicine by mouth with a glass of water. Follow the directions on the prescription label. Do not chew these tablets. If this medicine upsets your stomach, take it with food or milk. Take your doses at regular intervals. Do not take your medicine more often than directed. A special MedGuide will be given to you by the pharmacist with each new prescription and refill. Be sure to read this information carefully each time. Talk to your pediatrician regarding the use of this medicine in children. While this drug may be prescribed for children as young as 2  years for selected conditions, precautions do apply. Overdosage: If you think you have taken too much of this medicine contact a poison control center or emergency room at once. NOTE: This medicine is only for you. Do not share this medicine with others. What if I miss a dose? If you miss a dose, take it as soon as you can. If it is almost time for your next dose, take only that dose. Do not take double or extra doses. What may interact with this medicine?  atazanavir  carbamazepine  male hormones, including contraceptive or birth control pills  lopinavir  methotrexate  phenobarbital  phenytoin  primidone  pyrimethamine  rifampin  ritonavir  trimethoprim  valproic acid This list may not describe all possible interactions. Give your health care provider a list of all the medicines, herbs, non-prescription drugs, or dietary supplements you use. Also tell them if you smoke, drink alcohol, or use illegal drugs. Some items may interact with your medicine. What should I watch for while using this medicine? Visit your doctor or health care provider for regular checks on your progress. If you take this medicine for seizures, wear a Medic Alert bracelet or necklace. Carry an identification card with information about your condition, medicines, and doctor or health care provider. It is important to take this medicine exactly as directed. When first starting treatment, your dose will need to be adjusted slowly. It may take weeks or months before your dose is stable. You should contact your doctor or health care provider if  your seizures get worse or if you have any new types of seizures. Do not stop taking this medicine unless instructed by your doctor or health care provider. Stopping your medicine suddenly can increase your seizures or their severity. This medicine may cause serious skin reactions. They can happen weeks to months after starting the medicine. Contact your health care  provider right away if you notice fevers or flu-like symptoms with a rash. The rash may be red or purple and then turn into blisters or peeling of the skin. Or, you might notice a red rash with swelling of the face, lips or lymph nodes in your neck or under your arms. You may get drowsy, dizzy, or have blurred vision. Do not drive, use machinery, or do anything that needs mental alertness until you know how this medicine affects you. To reduce dizzy or fainting spells, do not sit or stand up quickly, especially if you are an older patient. Alcohol can increase drowsiness and dizziness. Avoid alcoholic drinks. If you are taking this medicine for bipolar disorder, it is important to report any changes in your mood to your doctor or health care provider. If your condition gets worse, you get mentally depressed, feel very hyperactive or manic, have difficulty sleeping, or have thoughts of hurting yourself or committing suicide, you need to get help from your health care provider right away. If you are a caregiver for someone taking this medicine for bipolar disorder, you should also report these behavioral changes right away. The use of this medicine may increase the chance of suicidal thoughts or actions. Pay special attention to how you are responding while on this medicine. Your mouth may get dry. Chewing sugarless gum or sucking hard candy, and drinking plenty of water may help. Contact your doctor if the problem does not go away or is severe. Women who become pregnant while using this medicine may enroll in the [place] Pregnancy Registry by calling [phone]. This registry collects information about the safety of antiepileptic drug use during pregnancy. This medicine may cause a decrease in folic acid. You should make sure that you get enough folic acid while you are taking this medicine. Discuss the foods you eat and the vitamins you take with your health care provider. What  side effects may I notice from receiving this medicine? Side effects that you should report to your doctor or health care professional as soon as possible:  allergic reactions like skin rash, itching or hives, swelling of the face, lips, or tongue  changes in vision  depressed mood  elevated mood, decreased need for sleep, racing thoughts, impulsive behavior  loss of balance or coordination  mouth sores  rash, fever, and swollen lymph nodes  redness, blistering, peeling or loosening of the skin, including inside the mouth  right upper belly pain  seizures  severe muscle pain  signs and symptoms of aseptic meningitis such as stiff neck and sensitivity to light, headache, drowsiness, fever, nausea, vomiting, rash  signs of infection - fever or chills, cough, sore throat, pain or difficulty passing urine  suicidal thoughts or other mood changes  swollen lymph nodes  trouble walking  unusual bruising or bleeding  unusually weak or tired  yellowing of the eyes or skin Side effects that usually do not require medical attention (report to your doctor or health care professional if they continue or are bothersome):  diarrhea  dizziness  dry mouth  stuffy nose  tiredness  tremors  trouble sleeping  This list may not describe all possible side effects. Call your doctor for medical advice about side effects. You may report side effects to FDA at 1-800-FDA-1088. Where should I keep my medicine? Keep out of reach of children. Store at room temperature between 15 and 30 degrees C (59 and 86 degrees F). Throw away any unused medicine after the expiration date. NOTE: This sheet is a summary. It may not cover all possible information. If you have questions about this medicine, talk to your doctor, pharmacist, or health care provider.  2020 Elsevier/Gold Standard (2018-04-06 15:03:40)

## 2019-07-08 NOTE — Progress Notes (Signed)
lamictal rx faxed to [facility] on [place] [name], [name]

## 2019-07-08 NOTE — Progress Notes (Signed)
PATIENT: Glen [name] DOB: [birth year]  REASON FOR VISIT: follow up HISTORY FROM: patient  HISTORY OF PRESENT ILLNESS: Today 07/08/19  HISTORY   Glen [name] a 49 year old male, accompanied by his mother Glen [name], seen in refer by his primary care PA Glen [name], for evaluation of seizure, Initial evaluation was on February 13, 2017.  I reviewed and summarized the referring note, he has past medical history of hyperlipidemia, hypothyroidism, testosterone deficiency, carried a diagnosis of autoimmune encephalomyelitis,  He was adopted by his mother at 36 days of age, 52, was able to review [name] record, he developed double vision, was diagnosed with fourth ventricle medulloblastoma, had surgery by Dr. Yetta [name], VP shunt replacement, this was followed by radiation therapy, he also had left parafalcine meningioma resected in 2004.  He had his first seizure in 1990s, has been treated with levetiracetam 750 mg twice a day, in September 2015, he had recurrent seizure, fever, confusion, was diagnosed with autoimmune cerebritis, was treated with Solu-Medrol 1 g daily for 5 days, and plasma exchange, Has regained significant recovery.   He had a brain biopsy as part of his workup for autoimmune cerebritis in 2015, which was consistent with gliosis/cerebritis, extensive autoimmune workup was negative, He also had a history of thrombocytopenia,  He had recurrent seizures, consistent of eye blinking, mouth drooling,  He was admitted to ICU in November 2015 for hypercapnic respiratory failure, decreased level of arousal, required intubation, brain MRI with contrast was suggestive of recurrent cerebritis, EEG excluded seizure, LP showed no signs of active infection, he was empirically treated with plasmapheresis, and his level of arousal improved with the treatment, his hospital course was also complicated by vent dependent, require tracheostomy, as well as C. 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, since his diagnosis of autoimmune cerebritis, he has significant decline in his functional status, with rehabilitation, he was eventually able to ambulate with walker, still with very unsteady ataxic gait, loss of hearing, slurred speech, difficulty reading, he had regained significant recovery from 2016, was able to begin to read his Bible again  Since November 2018, he was noted to have recurrent spells of whining from his sleep, seems to be scared, and bothered by his nightmares, but only happens at nighttime, when his mother checked on him, he was able to tell her about some dreams, there was no seizure activity noted.  Laboratory evaluations in January 2019, LDL was 91, hemoglobin was 15.2, normal CMP creatinine of 1.1, TSH was mildly elevated 5.92, testosterone level was less than 10  CAT scan of the brain 2005 following a seizure activity and possible fall, advanced atrophy with centralized volume loss, mild dilatation of the ventricular system, left frontal approach ventriculostomy, catheter tip terminated within the third ventricle, there is a ill-defined area of high attenuation adjacent to the mid aspect of ventriculostomy, within the left frontal lobe, and left basal ganglion, which likely represent intraparenchymal hemorrhage,  UPDATE July 19 2017: EEG on April 20 2017 was mildly abnormal. There is electrodiagnostic evidence of mild background slowing, indicating bi-hemisphere malfunction. There is no evidence of epileptiform discharge.  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, mild parenchymal brain volume loss,  left  ventriculoperitoneal shunt without hydrocephalus, scattered chronic parenchymal hemorrhages,  He was switched from [place] to Depakote in February 2019 attempting to better control of his mood disorder, anxiety, but on March 13, 2017 who was admitted to the event of hospital, following increased confusion, falling episode,  CT head without contrast, mild diffuse cortical atrophy, old right posterior parietal infarction, stable left ventriculostomy catheter with distal tip in the third ventricle, postsurgical change  Chest x-ray, no acute disease,  EKG, left ventricular hypertrophy, nonspecific T wave abnormality, prolonged QT,  Laboratory evaluations, hemoglobin of 14.4, WBC of 7.0, normal CMP, creatinine of 0.9  He was switched to Keppra 1500 mg twice a day liquid form, now he is back to his baseline,  Update March 26, 2018 SS: He is at today's appointment with his mother. She reports he is doing well. He is currently taking Keppra liquid 15 mils (1500 mg)twice daily,Vimpat 200 mg twice daily. He has not had any seizure episodes. He was taken off Depakote previously and switchedto Vimpat due to falling. His mother reports occasionally at night she will notice that he is reaching like he is pushing someone away in his sleep. He generally sleeps well. She has noticed that he pays close attention, studies paintings, church murals.He is to be a Company secretary. He uses a walker for ambulation, is unsteady, off balance. He has a great appetite, enjoys doing crossword puzzles, watching basketball. He is hard of hearing wearing hearing aids, very limited verbal response, difficult to understand. He had labs done recently had at his primary care doctor that werenormal. In September 2019 he had his gallbladder removed.  Update September 26, 2018 SS: His walker got hung coming into office today while his mother was pushing on the walker, hit his elbow (apparently didn't hit his  head), no seizure events, sleeps well at night, he lives with his mother, uses a walker, recently ordered wheelchair by Dr. Tessa [name]. His right leg gives away on him from previous stroke. During the day watches TV, puzzles, he enjoys church. Sometimes during the day he may feel restless in the car. He tried Seroquel and he was very drowsy.  He remains on Vimpat, liquid Keppra.  Update July 08, 2019 SS: Here today with his mom, brother for evaluation of seizure medication.  He had been off Vimpat for at least 2 weeks. His mom felt for the last 6 weeks, he has been more agitated, having hallucinations at night, whining, having bad dreams, acting strange. He will look at her very sternly.  No falls.  No seizures that she knows of.  He remains on Keppra.  Since off Vimpat, behavioral issues have slightly improved. Review of chart, back to February, indicated discussed with Dr. Tessa [name] episodes of confusion following Vimpat and Keppra doses. Did have UTI a few months ago, this cleared, no other signs of infection, has been eating well, enjoys green tea. Cannot tolerate Depakote. Indicates saw PCP recently, lab work was good.  REVIEW OF SYSTEMS: Out of a complete 14 system review of symptoms, the patient complains only of the following symptoms, and all other reviewed systems are negative.  Seizure, hallucinations  ALLERGIES: Allergies  Allergen Reactions  . Heparin Anaphylaxis    Patient has HITT  . Lidocaine Hives  . Quetiapine Other (See Comments)    Confusion  . Depakote [Divalproex Sodium] Rash  . Phenytoin Sodium Extended Rash    HOME MEDICATIONS: Outpatient Medications Prior to Visit  Medication Sig Dispense Refill  . atorvastatin (LIPITOR)  20 MG tablet TAKE 1 TABLET BY MOUTH EVERY DAY 90 tablet 0  . busPIRone (BUSPAR) 5 MG tablet TAKE 1 TABLET BY MOUTH TWICE A DAY 180 tablet 1  . FLUoxetine (PROZAC) 40 MG capsule Take 1 capsule (40 mg total) by mouth 2 (two) times daily. 180 capsule 0  .  levETIRAcetam (KEPPRA) 100 MG/ML solution Take 15 mLs (1,500 mg total) by mouth 2 (two) times daily. 1000 mL 11  . levothyroxine (SYNTHROID) 50 MCG tablet Take 1 tablet (50 mcg total) by mouth daily before breakfast. 90 tablet 1  . loratadine (CLARITIN) 10 MG tablet Take 10 mg by mouth daily.  1  . polyethylene glycol (MIRALAX / GLYCOLAX) packet Take 17 g by mouth daily. 14 each 0  . testosterone cypionate (DEPOTESTOSTERONE CYPIONATE) 200 MG/ML injection Inject 1 mL into the muscle every 14 (fourteen) days.    Marland Kitchen VIMPAT 200 MG TABS tablet TAKE 1 TABLET TWICE A DAY 60 tablet 2   No facility-administered medications prior to visit.    PAST MEDICAL HISTORY: Past Medical History:  Diagnosis Date  . Brain cancer ([place])   . Hiccough   . Hypercholesteremia   . Other elevated white blood cell count   . Other encephalitis and encephalomyelitis   . Posterior reversible encephalopathy syndrome   . Seizure ([place])   . Stroke ([place])   . Testicular hypofunction   . Thyroid disorder     PAST SURGICAL HISTORY: Past Surgical History:  Procedure Laterality Date  . APPENDECTOMY     1980s  . BRAIN SURGERY     1993  . CRANIOTOMY Right 04/09/2017   Procedure: CRANIOTOMY HEMATOMA EVACUATION SUBDURAL;  Surgeon: Erline Levine, MD;  Location: [facility];  Service: Neurosurgery;  Laterality: Right;  . DIRECT LARYNGOSCOPY N/A 04/09/2017   Procedure: DIRECT LARYNGOSCOPY;  Surgeon: Erline Levine, MD;  Location: [facility];  Service: Neurosurgery;  Laterality: N/A;  . DIRECT LARYNGOSCOPY N/A 04/09/2017   Procedure: DIRECT LARYNGOSCOPY;  Surgeon: [name], MD;  Location: [facility];  Service: ENT;  Laterality: N/A;  . DIRECT LARYNGOSCOPY N/A 04/18/2017   Procedure: DIRECT LARYNGOSCOPY;  Surgeon: [name], MD;  Location: [employer] [facility];  Service: ENT;  Laterality: N/A;  . EYE SURGERY     X5  . TRACHEOSTOMY TUBE PLACEMENT N/A 04/18/2017   Procedure: TRANSNASAL FIBER OPTIC LARYNGOSCOPY;  Surgeon: [name], MD;  Location: [facility];   Service: ENT;  Laterality: N/A;    FAMILY HISTORY: Family History  Adopted: Yes  Family history unknown: Yes    SOCIAL HISTORY: Social History   Socioeconomic History  . Marital status: Divorced    Spouse name: Not on file  . Number of children: 1  . Years of education: college  . Highest education level: Not on file  Occupational History  . Occupation: Disabled  Tobacco Use  . Smoking status: Never Smoker  . Smokeless tobacco: Never Used  Vaping Use  . Vaping Use: Never used  Substance and Sexual Activity  . Alcohol use: No  . Drug use: Never  . Sexual activity: Not on file  Other Topics Concern  . Not on file  Social History Narrative   Lives at home with parents.   Right-handed.   Occasional caffeine.   Social Determinants of Health   Financial Resource Strain:   . Difficulty of Paying Living Expenses:   Food Insecurity:   . Worried About Charity fundraiser in the Last Year:   . Arboriculturist in  the Last Year:   Transportation Needs:   . Film/video editor (Medical):   Marland Kitchen Lack of Transportation (Non-Medical):   Physical Activity:   . Days of Exercise per Week:   . Minutes of Exercise per Session:   Stress:   . Feeling of Stress :   Social Connections:   . Frequency of Communication with Friends and Family:   . Frequency of Social Gatherings with Friends and Family:   . Attends Religious Services:   . Active Member of Clubs or Organizations:   . Attends Archivist Meetings:   Marland Kitchen Marital Status:   Intimate Partner Violence:   . Fear of Current or Ex-Partner:   . Emotionally Abused:   Marland Kitchen Physically Abused:   . Sexually Abused:    PHYSICAL EXAM  Vitals:   07/08/19 1354  BP: 100/66  Pulse: 66   There is no height or weight on file to calculate BMI.  Generalized: Well developed, in no acute distress   Neurological examination  Mentation: Alert, hard of hearing not wearing hearing aids, history is provided by his family, limited  verbal response by patient, difficulty with exam commands due to hearing impairment Cranial nerve II-XII: Pupils were equal round reactive to light. Extraocular movements were full, visual field were full on confrontational test. Facial sensation and strength were normal.  Head turning and shoulder shrug were normal and symmetric. Motor: No significant muscle weakness noted Sensory: Sensory testing is intact to soft touch on all 4 extremities. No evidence of extinction is noted.  Coordination: Cannot perform exam commands Gait and station: In wheelchair, was not ambulated Reflexes: Deep tendon reflexes are symmetric  DIAGNOSTIC DATA (LABS, IMAGING, TESTING) - I reviewed patient records, labs, notes, testing and imaging myself where available.  Lab Results  Component Value Date   WBC 7.5 07/04/2019   HGB 15.9 07/04/2019   HCT 47.8 07/04/2019   MCV 88 07/04/2019   PLT 183 07/04/2019      Component Value Date/Time   NA 141 07/04/2019 1202   K 4.2 07/04/2019 1202   CL 103 07/04/2019 1202   CO2 23 07/04/2019 1202   GLUCOSE 77 07/04/2019 1202   GLUCOSE 99 05/15/2017 0603   BUN 11 07/04/2019 1202   CREATININE 1.10 07/04/2019 1202   CALCIUM 9.0 07/04/2019 1202   PROT 6.4 07/04/2019 1202   ALBUMIN 4.4 07/04/2019 1202   AST 24 07/04/2019 1202   ALT 20 07/04/2019 1202   ALKPHOS 67 07/04/2019 1202   BILITOT 0.6 07/04/2019 1202   GFRNONAA 78 07/04/2019 1202   GFRAA 91 07/04/2019 1202   Lab Results  Component Value Date   CHOL 150 07/04/2019   HDL 30 (L) 07/04/2019   LDLCALC 92 07/04/2019   TRIG 162 (H) 07/04/2019   CHOLHDL 5.0 07/04/2019   Lab Results  Component Value Date   HGBA1C 5.5 12/24/2013   Lab Results  Component Value Date   VITAMINB12 882 12/24/2013   Lab Results  Component Value Date   TSH 2.610 03/28/2019    ASSESSMENT AND PLAN 49 y.o. year old male  has a past medical history of Brain cancer ([place]), Hiccough, Hypercholesteremia, Other elevated white blood  cell count, Other encephalitis and encephalomyelitis, Posterior reversible encephalopathy syndrome, Seizure ([place]), Stroke [facility]), Testicular hypofunction, and Thyroid disorder. here with:  1.  Complex partial seizure, history of brainstem medulloblastoma, parafalcine meningioma, history of autoimmune encephalitis -Side effect of hallucinations, behavioral issues, agitation with Keppra and Vimpat, improved over the last  2 weeks since off Vimpat -Did discuss case with Dr. Krista Blue, will discontinue Vimpat -Start lamotrigine 25 mg chewable tablet, titrate to 100 mg twice daily (dissolvable tablet), advised to watch for rash, make sure drinking plenty of water -For now, continue Keppra liquid, 1500 mg twice daily -Could previously not tolerate Depakote -Follow-up in 3 months or sooner if needed   I spent 30 minutes of face-to-face and non-face-to-face time with patient.  This included previsit chart review, lab review, study review, order entry, electronic health record documentation, patient education.  Butler Denmark, AGNP-C, DNP 07/08/2019, 3:01 PM [facility] [address], [place], [place] [address] [phone]

## 2019-07-27 ENCOUNTER — Other Ambulatory Visit: Payer: Self-pay | Admitting: Neurology

## 2019-07-30 ENCOUNTER — Telehealth: Payer: Self-pay | Admitting: Neurology

## 2019-07-30 DIAGNOSIS — G40909 Epilepsy, unspecified, not intractable, without status epilepticus: Secondary | ICD-10-CM

## 2019-07-30 MED ORDER — LEVETIRACETAM 100 MG/ML PO SOLN: 1000.0000 mg | Freq: Two times a day (BID) | ORAL | 11 refills | Status: DC

## 2019-07-30 NOTE — Telephone Encounter (Signed)
Glendenning,Barbara(mother on DPR) has called to report that since pt has been on lamoTRIgine 100 MG TBDP he is very anxious and very much unlike himself.  Mother stated pt fell 3-4 times this past Sunday.  Please call mother to discuss.

## 2019-07-30 NOTE — Telephone Encounter (Signed)
I called the patient's mother.  Just yesterday, he increased Lamictal to 75 mg at BID.  She feels like his balance is not as good, is not resting as well.  He had a few falls on Sunday.  She feels he has done better off Vimpat, has not hit his head.    Discussed with Dr. Krista Blue, have him continue to increase Lamictal 100 mg twice a day per titration schedule.  Decrease dose of Keppra 1,000 mg twice daily.  Keep [place] updated.  Will place order Keppra and Lamictal blood levels, can come by the office in the next few days to have these done. Make sure taking Lamictal correctly.

## 2019-07-30 NOTE — Telephone Encounter (Signed)
Judson Roch- please advise.

## 2019-07-30 NOTE — Addendum Note (Signed)
Addended by: Suzzanne Cloud on: 07/30/2019 02:08 PM   Modules accepted: Orders

## 2019-07-31 ENCOUNTER — Other Ambulatory Visit: Payer: Self-pay | Admitting: Family Medicine

## 2019-07-31 NOTE — Telephone Encounter (Signed)
I called mother of pt.  I relayed that per SS/NP and Dr Krista Blue, that they want to have pt continue the titration for the lamotrigine (he is now on 75mg  po bid till Monday then will increase to 100mg  po bid.  Lab levels to be trough and she will bring meds with pt and have labs drawn then take medications.  This will be done next week, as she has multiple doctors appts the rest of this week.  She verbalized understanding of plan.  Will keep [place] updated on changes.  I relayed to decrease keppra 1000mg  po bid at this time.

## 2019-08-05 ENCOUNTER — Other Ambulatory Visit (INDEPENDENT_AMBULATORY_CARE_PROVIDER_SITE_OTHER): Payer: Medicare HMO

## 2019-08-05 DIAGNOSIS — G40909 Epilepsy, unspecified, not intractable, without status epilepticus: Secondary | ICD-10-CM | POA: Diagnosis not present

## 2019-08-05 DIAGNOSIS — Z0289 Encounter for other administrative examinations: Secondary | ICD-10-CM

## 2019-08-08 ENCOUNTER — Telehealth: Payer: Self-pay | Admitting: Neurology

## 2019-08-08 LAB — LAMOTRIGINE LEVEL: Lamotrigine Lvl: 3.2 ug/mL (ref 2.0–20.0)

## 2019-08-08 LAB — LEVETIRACETAM LEVEL: Levetiracetam Lvl: <1 ug/mL — ABNORMAL LOW (ref 10.0–40.0)

## 2019-08-08 NOTE — Telephone Encounter (Signed)
Blood work has returned, Keppra level is < 1.0, is he taking it? Lamictal level is ok at 3.2.  Check on patient please, we planned to continue Lamictal working up to 100 mg twice daily, decrease dose of Keppra 1000 mg BID.

## 2019-08-08 NOTE — Telephone Encounter (Signed)
LMVM for pts family to return call.

## 2019-08-12 NOTE — Telephone Encounter (Signed)
Pt mother has called 64 back please call

## 2019-08-12 NOTE — Telephone Encounter (Signed)
I called mother of pt.  She stated that he is taking lamictal 100mg  po bid starting today.   Has been taking the keppra 1000mg  po bid.  She feels like he is worse, hallucinations, antsy, acting out, which he is not that way normally.  She herself just got out of the hospital yesterday for CHF.  Please advise.

## 2019-08-12 NOTE — Telephone Encounter (Signed)
LMVM for mother, [name] of pt.  Relating to lab results.

## 2019-08-13 NOTE — Telephone Encounter (Signed)
Chart reviewed, agree switching from [place] to lamotrigine for control of seizure, and the irritation,   Please double check the medication that he is taking now,  Supposed to be off Keppra, lamotrigine dissolvable 100 mg twice a day, if he has no recurrent seizure, let him keep current dose of lamotrigine, if there are any spells suspicious for seizure, may titrating up the lamotrigine to 125 mg twice a day

## 2019-08-13 NOTE — Telephone Encounter (Signed)
Dr. Krista Blue suggests stopping Keppra, continue on Lamictal. Continue Lamictal 100 mg BID. Decrease Keppra 500 mg twice daily x 1 week, then 500 mg daily x 1 week, then stop.

## 2019-08-13 NOTE — Telephone Encounter (Signed)
I called mother of pt relayed the plan to keep on lamictal 100mg  po bid as he is taking now, decrease the keppra 500mg  po bid for 1 wk, then keppra 500mg  daily for one week the stop.  Call for concerns.  She verbalized understanding.

## 2019-08-22 ENCOUNTER — Telehealth: Payer: Self-pay

## 2019-08-22 ENCOUNTER — Other Ambulatory Visit: Payer: Self-pay | Admitting: Family Medicine

## 2019-08-22 NOTE — Telephone Encounter (Signed)
Glen [name] called to report that Glen [name] is anxious, restless and having hallucinations for the last 2-3 weeks.  Appointment was scheduled for tomorrow morning with Dr. Henrene Pastor.  She was instructed to carry him to the ED if symptoms worsen.

## 2019-08-23 ENCOUNTER — Encounter: Payer: Self-pay | Admitting: Legal Medicine

## 2019-08-23 ENCOUNTER — Other Ambulatory Visit: Payer: Self-pay

## 2019-08-23 ENCOUNTER — Ambulatory Visit (INDEPENDENT_AMBULATORY_CARE_PROVIDER_SITE_OTHER): Payer: Medicare HMO | Admitting: Legal Medicine

## 2019-08-23 VITALS — BP 100/80 | HR 96 | Temp 97.4°F | Resp 18 | Ht 74.0 in | Wt 205.0 lb

## 2019-08-23 DIAGNOSIS — D320 Benign neoplasm of cerebral meninges: Secondary | ICD-10-CM | POA: Diagnosis not present

## 2019-08-23 DIAGNOSIS — G319 Degenerative disease of nervous system, unspecified: Secondary | ICD-10-CM | POA: Diagnosis not present

## 2019-08-23 DIAGNOSIS — Q048 Other specified congenital malformations of brain: Secondary | ICD-10-CM | POA: Diagnosis not present

## 2019-08-23 DIAGNOSIS — R441 Visual hallucinations: Secondary | ICD-10-CM

## 2019-08-23 DIAGNOSIS — R220 Localized swelling, mass and lump, head: Secondary | ICD-10-CM | POA: Diagnosis not present

## 2019-08-23 DIAGNOSIS — G9389 Other specified disorders of brain: Secondary | ICD-10-CM | POA: Diagnosis not present

## 2019-08-23 LAB — POCT URINALYSIS DIP (CLINITEK)
Bilirubin, UA: NEGATIVE
Blood, UA: NEGATIVE
Glucose, UA: NEGATIVE mg/dL
Ketones, POC UA: NEGATIVE mg/dL
Leukocytes, UA: NEGATIVE
Nitrite, UA: NEGATIVE
Spec Grav, UA: 1.015 (ref 1.010–1.025)
Urobilinogen, UA: 1 E.U./dL
pH, UA: 7 (ref 5.0–8.0)

## 2019-08-23 MED ORDER — HALOPERIDOL 2 MG PO TABS: 2.0000 mg | ORAL_TABLET | Freq: Two times a day (BID) | ORAL | 2 refills | Status: DC

## 2019-08-23 NOTE — Progress Notes (Addendum)
Subjective:  Patient ID: Glen [name], male    DOB: [birth year]  Age: 49 y.o. MRN: [id number]  Chief Complaint  Patient presents with  . Hallucinations    Patient sees and hears people    HPI: patient has been having auditory or visual hallucinations hallucinations for 2 months.  It is getting worse.  Paranoid type.  Memory not worse.  He has traumatic subdural hematoma and he had brain tumors in past.  No head scans for years. No fevers.  Urine is normal, He is more agitated and moves hands a lot.  His speech is slurred.  No recent seizures.  He has a past history of meningioma and subdural hematoma.  We need to address this possibility.   Current Outpatient Medications on File Prior to Visit  Medication Sig Dispense Refill  . atorvastatin (LIPITOR) 20 MG tablet TAKE 1 TABLET BY MOUTH EVERY DAY 90 tablet 2  . FLUoxetine (PROZAC) 40 MG capsule Take 1 capsule (40 mg total) by mouth 2 (two) times daily. 180 capsule 0  . lamoTRIgine (LAMICTAL) 25 MG CHEW chewable tablet 1 tab bid x one week 2 tab bid x 2nd week 3 tab bid x 3rd week (Patient taking differently: Chew 100 mg by mouth daily. 1 tab bid x one week 2 tab bid x 2nd week 3 tab bid x 3rd week) 84 tablet 0  . levothyroxine (SYNTHROID) 50 MCG tablet Take 1 tablet (50 mcg total) by mouth daily before breakfast. 90 tablet 1  . loratadine (CLARITIN) 10 MG tablet Take 10 mg by mouth daily.  1  . polyethylene glycol (MIRALAX / GLYCOLAX) packet Take 17 g by mouth daily. 14 each 0  . testosterone cypionate (DEPOTESTOSTERONE CYPIONATE) 200 MG/ML injection Inject 1 mL into the muscle every 14 (fourteen) days.     No current facility-administered medications on file prior to visit.   Past Medical History:  Diagnosis Date  . Brain cancer ([place])   . Hiccough   . Hypercholesteremia   . Other elevated white blood cell count   . Other encephalitis and encephalomyelitis   . Posterior reversible encephalopathy syndrome   . Seizure ([place])     . Stroke ([place])   . Testicular hypofunction   . Thyroid disorder    Past Surgical History:  Procedure Laterality Date  . APPENDECTOMY     1980s  . BRAIN SURGERY     1993  . CRANIOTOMY Right 04/09/2017   Procedure: CRANIOTOMY HEMATOMA EVACUATION SUBDURAL;  Surgeon: Erline Levine, MD;  Location: [facility];  Service: Neurosurgery;  Laterality: Right;  . DIRECT LARYNGOSCOPY N/A 04/09/2017   Procedure: DIRECT LARYNGOSCOPY;  Surgeon: Erline Levine, MD;  Location: [facility];  Service: Neurosurgery;  Laterality: N/A;  . DIRECT LARYNGOSCOPY N/A 04/09/2017   Procedure: DIRECT LARYNGOSCOPY;  Surgeon: [name], MD;  Location: [facility];  Service: ENT;  Laterality: N/A;  . DIRECT LARYNGOSCOPY N/A 04/18/2017   Procedure: DIRECT LARYNGOSCOPY;  Surgeon: [name], MD;  Location: [facility];  Service: ENT;  Laterality: N/A;  . EYE SURGERY     X5  . TRACHEOSTOMY TUBE PLACEMENT N/A 04/18/2017   Procedure: TRANSNASAL FIBER OPTIC LARYNGOSCOPY;  Surgeon: [name], MD;  Location: [facility];  Service: ENT;  Laterality: N/A;    Family History  Adopted: Yes  Family history unknown: Yes   Social History   Socioeconomic History  . Marital status: Divorced    Spouse name: Not on file  . Number of children: 1  .  Years of education: college  . Highest education level: Not on file  Occupational History  . Occupation: Disabled  Tobacco Use  . Smoking status: Never Smoker  . Smokeless tobacco: Never Used  Vaping Use  . Vaping Use: Never used  Substance and Sexual Activity  . Alcohol use: No  . Drug use: Never  . Sexual activity: Not Currently  Other Topics Concern  . Not on file  Social History Narrative   Lives at home with parents.   Right-handed.   Occasional caffeine.   Social Determinants of Health   Financial Resource Strain:   . Difficulty of Paying Living Expenses:   Food Insecurity:   . Worried About Charity fundraiser in the Last Year:   . Arboriculturist in the Last Year:   Transportation  Needs:   . Film/video editor (Medical):   Marland Kitchen Lack of Transportation (Non-Medical):   Physical Activity:   . Days of Exercise per Week:   . Minutes of Exercise per Session:   Stress:   . Feeling of Stress :   Social Connections:   . Frequency of Communication with Friends and Family:   . Frequency of Social Gatherings with Friends and Family:   . Attends Religious Services:   . Active Member of Clubs or Organizations:   . Attends Archivist Meetings:   Marland Kitchen Marital Status:     Review of Systems  Constitutional: Negative.   HENT: Negative.   Eyes: Negative.   Cardiovascular: Negative.   Gastrointestinal: Negative.   Genitourinary: Negative.   Musculoskeletal: Negative.   Skin: Negative.   Psychiatric/Behavioral: Positive for agitation and hallucinations.     Objective:  BP 100/80 (BP Location: Right Arm, Patient Position: Sitting)   Pulse 96   Temp (!) 97.4 F (36.3 C) (Temporal)   Resp 18   Ht 6\' 2"  (1.88 m)   Wt 205 lb (93 kg)   SpO2 96%   BMI 26.32 kg/m   BP/Weight 08/23/2019 07/08/2019 0/99/8338  Systolic BP 250 539 767  Diastolic BP 80 66 78  Wt. (Lbs) 205 - -  BMI 26.32 - 30.27    Physical Exam Constitutional:      Comments: In wheel chair  HENT:     Right Ear: Tympanic membrane normal.     Left Ear: Tympanic membrane normal.     Nose: Nose normal.     Mouth/Throat:     Mouth: Mucous membranes are moist.     Pharynx: Oropharynx is clear.  Eyes:     Extraocular Movements: Extraocular movements intact.     Conjunctiva/sclera: Conjunctivae normal.     Pupils: Pupils are equal, round, and reactive to light.  Cardiovascular:     Rate and Rhythm: Normal rate and regular rhythm.     Pulses: Normal pulses.     Heart sounds: Normal heart sounds.  Pulmonary:     Effort: Pulmonary effort is normal.     Breath sounds: Normal breath sounds.  Abdominal:     General: Abdomen is flat. Bowel sounds are normal.     Palpations: Abdomen is soft.    Musculoskeletal:     Cervical back: Neck supple.  Skin:    Capillary Refill: Capillary refill takes less than 2 seconds.  Neurological:     Mental Status: He is alert. He is disoriented.     Cranial Nerves: Cranial nerves are intact.     Sensory: Sensation is intact.     Motor:  Weakness, tremor and atrophy present.     Coordination: Coordination abnormal.     Deep Tendon Reflexes:     Reflex Scores:      Patellar reflexes are 3+ on the right side and 3+ on the left side.      Achilles reflexes are 3+ on the right side and 3+ on the left side.    Comments: Patient in wheel chair, he does not answer questions, caregiver answers them Negative Kernig, positive glabellar response       Lab Results  Component Value Date   WBC 7.5 07/04/2019   HGB 15.9 07/04/2019   HCT 47.8 07/04/2019   PLT 183 07/04/2019   GLUCOSE 77 07/04/2019   CHOL 150 07/04/2019   TRIG 162 (H) 07/04/2019   HDL 30 (L) 07/04/2019   LDLCALC 92 07/04/2019   ALT 20 07/04/2019   AST 24 07/04/2019   NA 141 07/04/2019   K 4.2 07/04/2019   CL 103 07/04/2019   CREATININE 1.10 07/04/2019   BUN 11 07/04/2019   CO2 23 07/04/2019   TSH 2.610 03/28/2019   INR 0.74 04/10/2017   HGBA1C 5.5 12/24/2013      Assessment & Plan:   1. Hallucination, visual - POCT URINALYSIS DIP (CLINITEK) Compl;ex patient with 3 months of  visual and auditory hallucinations.  He has historyof chronic brain injury with former strokes, subdural hematoma and pst meningioma.  He is is wheelchair and cannot answer questions.  Family was source of information.  No new focal changes.  Get CT head and recheck lab, no UTI found and no evidence for pneumonia oir meningeal infection. CT shows new meningioma on frontal lobes, needs MRI No orders of the defined types were placed in this encounter.   Orders Placed This Encounter  Procedures  . POCT URINALYSIS DIP (CLINITEK)     Follow-up: No follow-ups on file.  An After Visit Summary was  printed and given to the patient.  [facility] [phone]

## 2019-08-26 NOTE — Addendum Note (Signed)
Addended by: Reinaldo Meeker on: 08/26/2019 04:18 PM   Modules accepted: Orders

## 2019-08-27 LAB — COMPREHENSIVE METABOLIC PANEL
ALT: 18 IU/L (ref 0–44)
AST: 25 IU/L (ref 0–40)
Albumin/Globulin Ratio: 2.1 (ref 1.2–2.2)
Albumin: 4.8 g/dL (ref 4.0–5.0)
Alkaline Phosphatase: 94 IU/L (ref 48–121)
BUN/Creatinine Ratio: 8 — ABNORMAL LOW (ref 9–20)
BUN: 10 mg/dL (ref 6–24)
Bilirubin Total: 0.7 mg/dL (ref 0.0–1.2)
CO2: 26 mmol/L (ref 20–29)
Calcium: 9.6 mg/dL (ref 8.7–10.2)
Chloride: 99 mmol/L (ref 96–106)
Creatinine, Ser: 1.31 mg/dL — ABNORMAL HIGH (ref 0.76–1.27)
GFR calc Af Amer: 73 mL/min/{1.73_m2} (ref >59)
GFR calc non Af Amer: 63 mL/min/{1.73_m2} (ref >59)
Globulin, Total: 2.3 g/dL (ref 1.5–4.5)
Glucose: 85 mg/dL (ref 65–99)
Potassium: 4.4 mmol/L (ref 3.5–5.2)
Sodium: 139 mmol/L (ref 134–144)
Total Protein: 7.1 g/dL (ref 6.0–8.5)

## 2019-08-27 LAB — ANA,IFA RA DIAG PNL W/RFLX TIT/PATN
ANA Titer 1: NEGATIVE
Cyclic Citrullin Peptide Ab: 5 units (ref 0–19)
Rheumatoid fact SerPl-aCnc: <10 IU/mL (ref 0.0–13.9)

## 2019-08-27 LAB — CBC WITH DIFFERENTIAL/PLATELET
Basophils Absolute: 0.1 10*3/uL (ref 0.0–0.2)
Basos: 1 %
EOS (ABSOLUTE): 0.2 10*3/uL (ref 0.0–0.4)
Eos: 3 %
Hematocrit: 48.4 % (ref 37.5–51.0)
Hemoglobin: 16.2 g/dL (ref 13.0–17.7)
Immature Grans (Abs): 0.1 10*3/uL (ref 0.0–0.1)
Immature Granulocytes: 1 %
Lymphocytes Absolute: 2 10*3/uL (ref 0.7–3.1)
Lymphs: 26 %
MCH: 29.3 pg (ref 26.6–33.0)
MCHC: 33.5 g/dL (ref 31.5–35.7)
MCV: 88 fL (ref 79–97)
Monocytes Absolute: 0.6 10*3/uL (ref 0.1–0.9)
Monocytes: 8 %
Neutrophils Absolute: 4.8 10*3/uL (ref 1.4–7.0)
Neutrophils: 61 %
Platelets: 240 10*3/uL (ref 150–450)
RBC: 5.52 x10E6/uL (ref 4.14–5.80)
RDW: 14.1 % (ref 11.6–15.4)
WBC: 7.7 10*3/uL (ref 3.4–10.8)

## 2019-08-27 LAB — C-REACTIVE PROTEIN: CRP: 5 mg/L (ref 0–10)

## 2019-08-27 NOTE — Progress Notes (Signed)
CBC normal, creatinine high, liver tests normal, CRP 5 normal no inflammation, ANA negative, CCP negative no lupus or inflammatory disorder causing hallucinations lp

## 2019-09-04 ENCOUNTER — Encounter: Payer: Medicare HMO | Attending: Physical Medicine & Rehabilitation | Admitting: Physical Medicine & Rehabilitation

## 2019-09-04 ENCOUNTER — Encounter: Payer: Self-pay | Admitting: Physical Medicine & Rehabilitation

## 2019-09-04 ENCOUNTER — Other Ambulatory Visit: Payer: Self-pay

## 2019-09-04 VITALS — BP 103/69 | HR 91 | Temp 98.6°F | Ht 74.0 in | Wt 190.0 lb

## 2019-09-04 DIAGNOSIS — S065X3S Traumatic subdural hemorrhage with loss of consciousness of 1 hour to 5 hours 59 minutes, sequela: Secondary | ICD-10-CM | POA: Diagnosis not present

## 2019-09-04 NOTE — Patient Instructions (Signed)
PLEASE FEEL FREE TO CALL OUR OFFICE WITH ANY PROBLEMS OR QUESTIONS ([phone])      

## 2019-09-04 NOTE — Progress Notes (Signed)
Subjective:    Patient ID: Glen [name], male    DOB: Mar 31, 1970, 48 y.o.   MRN: [id number]  HPI   Glen [name] is here in follow up of his right SDH and asociated deficits. He is off vimpat and keppra d/t agitation and AMS. He was placed on lamictal for seizure prophylaxis (?as well as mood control.) Also he was placed on haldol for mood stabilization.   He is using walker rarely d/t balance. He is mostly in w/c. He goes to church with family on the weekends.    Pain Inventory Average Pain 0 Pain Right Now 0 My pain is No pain.  LOCATION OF PAIN No pain  BOWEL Number of stools per week: 4 Oral laxative use Yes  Type of laxative Miralax  Enema or suppository use No  History of colostomy No  Incontinent No   BLADDER Normal In and out cath, frequency  no  Able to self cath No  Bladder incontinence No  Frequent urination No  Leakage with coughing No  Difficulty starting stream No  Incomplete bladder emptying No    Mobility how many minutes can you walk? Can not walk. ability to climb steps?  no do you drive?  no use a wheelchair needs help with transfers Do you have any goals in this area?  yes  Function disabled: date disabled 15 I need assistance with the following:  dressing, bathing, meal prep, household duties and shopping Do you have any goals in this area?  yes  Neuro/Psych trouble walking depression anxiety  Prior Studies Any changes since last visit?  yes CT/MRI MRI at [facility] involved in your care Any changes since last visit?  no   Family History  Adopted: Yes  Family history unknown: Yes   Social History   Socioeconomic History  . Marital status: Divorced    Spouse name: Not on file  . Number of children: 1  . Years of education: college  . Highest education level: Not on file  Occupational History  . Occupation: Disabled  Tobacco Use  . Smoking status: Never Smoker  . Smokeless tobacco: Never Used    Vaping Use  . Vaping Use: Never used  Substance and Sexual Activity  . Alcohol use: No  . Drug use: Never  . Sexual activity: Not Currently  Other Topics Concern  . Not on file  Social History Narrative   Lives at home with parents.   Right-handed.   Occasional caffeine.   Social Determinants of Health   Financial Resource Strain:   . Difficulty of Paying Living Expenses:   Food Insecurity:   . Worried About Charity fundraiser in the Last Year:   . Arboriculturist in the Last Year:   Transportation Needs:   . Film/video editor (Medical):   Marland Kitchen Lack of Transportation (Non-Medical):   Physical Activity:   . Days of Exercise per Week:   . Minutes of Exercise per Session:   Stress:   . Feeling of Stress :   Social Connections:   . Frequency of Communication with Friends and Family:   . Frequency of Social Gatherings with Friends and Family:   . Attends Religious Services:   . Active Member of Clubs or Organizations:   . Attends Archivist Meetings:   Marland Kitchen Marital Status:    Past Surgical History:  Procedure Laterality Date  . APPENDECTOMY     1980s  . BRAIN SURGERY  1993  . CRANIOTOMY Right 04/09/2017   Procedure: CRANIOTOMY HEMATOMA EVACUATION SUBDURAL;  Surgeon: Erline Levine, MD;  Location: [facility];  Service: Neurosurgery;  Laterality: Right;  . DIRECT LARYNGOSCOPY N/A 04/09/2017   Procedure: DIRECT LARYNGOSCOPY;  Surgeon: Erline Levine, MD;  Location: [facility];  Service: Neurosurgery;  Laterality: N/A;  . DIRECT LARYNGOSCOPY N/A 04/09/2017   Procedure: DIRECT LARYNGOSCOPY;  Surgeon: [name], MD;  Location: [facility];  Service: ENT;  Laterality: N/A;  . DIRECT LARYNGOSCOPY N/A 04/18/2017   Procedure: DIRECT LARYNGOSCOPY;  Surgeon: [name], MD;  Location: [facility];  Service: ENT;  Laterality: N/A;  . EYE SURGERY     X5  . TRACHEOSTOMY TUBE PLACEMENT N/A 04/18/2017   Procedure: TRANSNASAL FIBER OPTIC LARYNGOSCOPY;  Surgeon: [name], MD;  Location: [facility];  Service: ENT;  Laterality: N/A;   Past Medical History:  Diagnosis Date  . Brain cancer ([place])   . Hiccough   . Hypercholesteremia   . Other elevated white blood cell count   . Other encephalitis and encephalomyelitis   . Posterior reversible encephalopathy syndrome   . Seizure ([place])   . Stroke ([place])   . Testicular hypofunction   . Thyroid disorder    Temp 98.6 F (37 C)   Ht 6\' 2"  (1.88 m)   Wt 190 lb (86.2 kg) Comment: pt not able to stand per mother  BMI 24.39 kg/m   Opioid Risk Score:   Fall Risk Score:  `1  Depression screen PHQ 2/9  Depression screen [facility] 2/9 09/04/2019 05/24/2017  Decreased Interest 1 0  Down, Depressed, Hopeless 1 0  PHQ - 2 Score 2 0  Altered sleeping - 0  Tired, decreased energy - 1  Change in appetite - 0  Feeling bad or failure about yourself  - 0  Trouble concentrating - 1  Moving slowly or fidgety/restless - 0  Suicidal thoughts - 0  PHQ-9 Score - 2  Difficult doing work/chores - Not difficult at all   Review of Systems  Constitutional: Negative.   HENT: Negative.   Eyes: Negative.   Respiratory: Negative.   Cardiovascular: Negative.   Gastrointestinal: Negative.   Endocrine: Negative.   Genitourinary: Negative.   Musculoskeletal: Positive for gait problem.  Skin: Negative.   Allergic/Immunologic: Negative.   Hematological: Negative.   Psychiatric/Behavioral: Negative.   All other systems reviewed and are negative.      Objective:   Physical Exam  General: No acute distress HEENT: EOMI, oral membranes moist Cards: reg rate  Chest: normal effort Abdomen: Soft, NT, ND Skin: dry, intact Extremities: no edema  Neurological. alert. Verbalizes  Unintelligiblly. Makes eye contacts intermittently.     in w/c today. Hoh. Slow to process. Moves left side more spontaneously than right. Moves all 4's.  Skin.   craniotomy sites clean Psych: flat             Assessment & Plan:  1.  Decreased functional mobility secondary  to right subdural hematoma after fall complicated by acute hypoxic respiratory failure and stridor as well as history of glioblastoma and seizure disorder             -maintain HEP as tolerated.              -w/c for mobilty, not a functional ambulator at this point    2.  Insomnia: haldol bid with second dose before bed.   3. Mood: Prozac 20 mg daily. Haldol bid-   Seizure disorder.  lamictal per neurology 4.  Dysphagia: continue with current diet. 5.  Hypothyroidism.  Synthroid 6. Hearing Loss:  Hearing aid/amplifier useful   7. Cholecystitis: Status post gallbladder surgery without any problems     15 minutes of direct patient care time was spent today.  I will see him back in about 6 months.

## 2019-09-18 ENCOUNTER — Other Ambulatory Visit: Payer: Self-pay

## 2019-09-18 DIAGNOSIS — R441 Visual hallucinations: Secondary | ICD-10-CM

## 2019-09-18 MED ORDER — HALOPERIDOL 2 MG PO TABS: 2.0000 mg | ORAL_TABLET | Freq: Two times a day (BID) | ORAL | 2 refills | Status: DC

## 2019-09-18 MED ORDER — TESTOSTERONE CYPIONATE 200 MG/ML IM SOLN: 200.0000 mg | INTRAMUSCULAR | 0 refills | Status: DC

## 2019-09-27 ENCOUNTER — Other Ambulatory Visit: Payer: Self-pay | Admitting: Family Medicine

## 2019-09-30 ENCOUNTER — Telehealth: Payer: Self-pay | Admitting: Neurology

## 2019-09-30 NOTE — Telephone Encounter (Signed)
Glen [name] called and LVM stating that the pt would have to r/s due to his son who is his ride is in the hospital with Covid. Phone rep called number back but call was not answered and there was no VM to leave message.

## 2019-09-30 NOTE — Telephone Encounter (Signed)
Noted will wait for them to call back for r/s appt.

## 2019-10-01 ENCOUNTER — Ambulatory Visit: Payer: Medicare HMO | Admitting: Neurology

## 2019-10-01 NOTE — Telephone Encounter (Signed)
Made appt 01-15-20 AT 1515. (next available), will look for cancellation.

## 2019-10-01 NOTE — Telephone Encounter (Signed)
Pt's mother Pamala Hurry left a VM returning Sandy's call.

## 2019-10-02 ENCOUNTER — Other Ambulatory Visit: Payer: Self-pay | Admitting: Family Medicine

## 2019-10-07 ENCOUNTER — Encounter: Payer: Self-pay | Admitting: Family Medicine

## 2019-10-07 ENCOUNTER — Ambulatory Visit (INDEPENDENT_AMBULATORY_CARE_PROVIDER_SITE_OTHER): Payer: Medicare HMO | Admitting: Family Medicine

## 2019-10-07 ENCOUNTER — Other Ambulatory Visit: Payer: Self-pay

## 2019-10-07 VITALS — BP 110/62 | HR 76 | Temp 95.2°F | Ht 74.0 in

## 2019-10-07 DIAGNOSIS — E782 Mixed hyperlipidemia: Secondary | ICD-10-CM | POA: Diagnosis not present

## 2019-10-07 DIAGNOSIS — E038 Other specified hypothyroidism: Secondary | ICD-10-CM | POA: Diagnosis not present

## 2019-10-07 DIAGNOSIS — G8191 Hemiplegia, unspecified affecting right dominant side: Secondary | ICD-10-CM

## 2019-10-07 DIAGNOSIS — Z23 Encounter for immunization: Secondary | ICD-10-CM | POA: Diagnosis not present

## 2019-10-07 DIAGNOSIS — R269 Unspecified abnormalities of gait and mobility: Secondary | ICD-10-CM

## 2019-10-07 DIAGNOSIS — D320 Benign neoplasm of cerebral meninges: Secondary | ICD-10-CM

## 2019-10-07 DIAGNOSIS — G40909 Epilepsy, unspecified, not intractable, without status epilepticus: Secondary | ICD-10-CM | POA: Diagnosis not present

## 2019-10-07 DIAGNOSIS — R4701 Aphasia: Secondary | ICD-10-CM | POA: Diagnosis not present

## 2019-10-07 DIAGNOSIS — R69 Illness, unspecified: Secondary | ICD-10-CM | POA: Diagnosis not present

## 2019-10-07 DIAGNOSIS — F331 Major depressive disorder, recurrent, moderate: Secondary | ICD-10-CM

## 2019-10-07 NOTE — Progress Notes (Addendum)
Established Patient Office Visit  Subjective:  Patient ID: Glen [name], male    DOB: [birth year]  Age: 49 y.o. MRN: [id number]  CC:  Chief Complaint  Patient presents with  . Hypothyroidism  . Hyperlipidemia    HPI Glen [name] presents for follow up of chronic medical issues with his Mother, Pamala Hurry.  Glen [name] presents with atrophy of thyroid (acquired).  Date of diagnosis 2010.  He is currently taking Synthroid, 50 mcg daily.      Concerning other seizures, Glen [name] is being seen in follow-up for a seizure disorder.  The precipitating event for the patient's seizure was post neuro-surgery for brain tumor.  During the course of treatment there have been no new seizures.  Current seizure medications include Lamictal.  Weakness-Mother wonders if patient has possibly had another stroke due to weakness in patients right leg and worsened speech.    Pt presents with hyperlipidemia.  Date of diagnosis 01/2013.  Current treatment includes Lipitor 20 mg once daily at night. He has a good appetite and eats a low cholesterol diet, and follows up as directed.     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  This was diagnosed 11/2013.  The course has been stable and nonprogressive.  It is of moderate intensity.  Currently on lamictal.  Associated symptoms include unsteady gait, speech problems, hearing loss and seizures.  He denies headache.  Being followed by Dr.Yan, neurologist.  No seizures.   Patient has anxiety/depression. He is on  prozac and haldol.Marland Kitchen He is nonverbal. Follows some of  my directions. He is alert.   Past Medical History:  Diagnosis Date  . Brain cancer ([place])   . Hiccough   . Hypercholesteremia   . Other elevated white blood cell count   . Other encephalitis and encephalomyelitis   . Posterior reversible encephalopathy syndrome   . Seizure ([place])   . Stroke ([place])   . Testicular hypofunction   . Thyroid disorder     Past Surgical  History:  Procedure Laterality Date  . APPENDECTOMY     1980s  . BRAIN SURGERY     1993  . CRANIOTOMY Right 04/09/2017   Procedure: CRANIOTOMY HEMATOMA EVACUATION SUBDURAL;  Surgeon: Erline Levine, MD;  Location: [facility];  Service: Neurosurgery;  Laterality: Right;  . DIRECT LARYNGOSCOPY N/A 04/09/2017   Procedure: DIRECT LARYNGOSCOPY;  Surgeon: Erline Levine, MD;  Location: [facility];  Service: Neurosurgery;  Laterality: N/A;  . DIRECT LARYNGOSCOPY N/A 04/09/2017   Procedure: DIRECT LARYNGOSCOPY;  Surgeon: [name], MD;  Location: [facility];  Service: ENT;  Laterality: N/A;  . DIRECT LARYNGOSCOPY N/A 04/18/2017   Procedure: DIRECT LARYNGOSCOPY;  Surgeon: [name], MD;  Location: [facility];  Service: ENT;  Laterality: N/A;  . EYE SURGERY     X5  . TRACHEOSTOMY TUBE PLACEMENT N/A 04/18/2017   Procedure: TRANSNASAL FIBER OPTIC LARYNGOSCOPY;  Surgeon: [name], MD;  Location: [facility];  Service: ENT;  Laterality: N/A;    Family History  Adopted: Yes  Family history unknown: Yes    Social History   Socioeconomic History  . Marital status: Divorced    Spouse name: Not on file  . Number of children: 1  . Years of education: college  . Highest education level: Not on file  Occupational History  . Occupation: Disabled  Tobacco Use  . Smoking status: Never Smoker  . Smokeless tobacco: Never Used  Vaping Use  . Vaping Use: Never used  Substance  and Sexual Activity  . Alcohol use: No  . Drug use: Never  . Sexual activity: Not Currently  Other Topics Concern  . Not on file  Social History Narrative   Lives at home with parents.   Right-handed.   Occasional caffeine.   Social Determinants of Health   Financial Resource Strain:   . Difficulty of Paying Living Expenses: Not on file  Food Insecurity:   . Worried About Charity fundraiser in the Last Year: Not on file  . Ran Out of Food in the Last Year: Not on file  Transportation Needs:   . Lack of Transportation (Medical): Not on  file  . Lack of Transportation (Non-Medical): Not on file  Physical Activity:   . Days of Exercise per Week: Not on file  . Minutes of Exercise per Session: Not on file  Stress:   . Feeling of Stress : Not on file  Social Connections:   . Frequency of Communication with Friends and Family: Not on file  . Frequency of Social Gatherings with Friends and Family: Not on file  . Attends Religious Services: Not on file  . Active Member of Clubs or Organizations: Not on file  . Attends Archivist Meetings: Not on file  . Marital Status: Not on file  Intimate Partner Violence:   . Fear of Current or Ex-Partner: Not on file  . Emotionally Abused: Not on file  . Physically Abused: Not on file  . Sexually Abused: Not on file    Outpatient Medications Prior to Visit  Medication Sig Dispense Refill  . atorvastatin (LIPITOR) 20 MG tablet TAKE 1 TABLET BY MOUTH EVERY DAY 90 tablet 2  . FLUoxetine (PROZAC) 40 MG capsule TAKE 1 CAPSULE BY MOUTH TWICE A DAY 180 capsule 0  . haloperidol (HALDOL) 2 MG tablet Take 1 tablet (2 mg total) by mouth 2 (two) times daily. 60 tablet 2  . lamoTRIgine (LAMICTAL) 25 MG CHEW chewable tablet 1 tab bid x one week 2 tab bid x 2nd week 3 tab bid x 3rd week (Patient taking differently: Chew 100 mg by mouth daily. 1 tab bid x one week 2 tab bid x 2nd week 3 tab bid x 3rd week) 84 tablet 0  . levothyroxine (SYNTHROID) 50 MCG tablet TAKE 1 TABLET BY MOUTH DAILY BEFORE BREAKFAST 90 tablet 1  . loratadine (CLARITIN) 10 MG tablet Take 10 mg by mouth daily.  1  . polyethylene glycol (MIRALAX / GLYCOLAX) packet Take 17 g by mouth daily. 14 each 0  . testosterone cypionate (DEPOTESTOSTERONE CYPIONATE) 200 MG/ML injection Inject 1 mL (200 mg total) into the muscle every 14 (fourteen) days. 10 mL 0   No facility-administered medications prior to visit.    Allergies  Allergen Reactions  . Heparin Anaphylaxis    Patient has HITT  . Lidocaine Hives  . Quetiapine  Other (See Comments)    Confusion  . Depakote [Divalproex Sodium] Rash  . Phenytoin Sodium Extended Rash    ROS Review of Systems  Constitutional: Negative for chills, fatigue and fever.  HENT: Negative for rhinorrhea.   Respiratory: Positive for cough. Negative for shortness of breath.   Cardiovascular: Negative for chest pain.  Gastrointestinal: Negative for abdominal pain and vomiting.  Neurological: Positive for weakness.  Psychiatric/Behavioral: Positive for dysphoric mood. Negative for agitation. The patient is not nervous/anxious.       Objective:    Physical Exam Vitals reviewed.  Constitutional:  Appearance: He is well-developed.  Cardiovascular:     Rate and Rhythm: Normal rate and regular rhythm.     Heart sounds: Normal heart sounds.  Pulmonary:     Effort: Pulmonary effort is normal.     Breath sounds: Normal breath sounds.  Abdominal:     General: Bowel sounds are normal.     Palpations: Abdomen is soft.     Tenderness: There is no abdominal tenderness.  Musculoskeletal:     Comments: Ataxia with standing.  Strength 4/5 right leg. 5/5 left leg.  UE 5/5.  Neurological:     Mental Status: He is alert. Mental status is at baseline.     Gait: Gait abnormal.  Psychiatric:        Mood and Affect: Mood normal.     BP 110/62   Pulse 76   Temp (!) 95.2 F (35.1 C)   Ht 6\' 2"  (1.88 m)   SpO2 96%   BMI 24.39 kg/m  Wt Readings from Last 3 Encounters:  09/04/19 190 lb (86.2 kg)  08/23/19 205 lb (93 kg)  09/05/18 205 lb (93 kg)     Health Maintenance Due  Topic Date Due  . Hepatitis C Screening  Never done  . HIV Screening  Never done  . TETANUS/TDAP  Never done    There are no preventive care reminders to display for this patient.  Lab Results  Component Value Date   TSH 2.610 03/28/2019   Lab Results  Component Value Date   WBC 8.8 10/07/2019   HGB 15.4 10/07/2019   HCT 46.3 10/07/2019   MCV 89 10/07/2019   PLT 253 10/07/2019    Lab Results  Component Value Date   NA 139 10/07/2019   K 4.3 10/07/2019   CO2 25 10/07/2019   GLUCOSE 74 10/07/2019   BUN 9 10/07/2019   CREATININE 1.16 10/07/2019   BILITOT 0.5 10/07/2019   ALKPHOS 96 10/07/2019   AST 26 10/07/2019   ALT 34 10/07/2019   PROT 6.9 10/07/2019   ALBUMIN 4.6 10/07/2019   CALCIUM 9.0 10/07/2019   ANIONGAP 6 05/15/2017   Lab Results  Component Value Date   CHOL 126 10/07/2019   Lab Results  Component Value Date   HDL 31 (L) 10/07/2019   Lab Results  Component Value Date   LDLCALC 72 10/07/2019   Lab Results  Component Value Date   TRIG 127 10/07/2019   Lab Results  Component Value Date   CHOLHDL 4.1 10/07/2019   Assessment & Plan:  1. Mixed hyperlipidemia Well controlled.  No changes to medicines.  Continue to work on eating a healthy diet and exercise.  Labs drawn today.  - CBC with Differential/Platelet - Comprehensive metabolic panel - Lipid panel  2. Secondary hypothyroidism The current medical regimen is effective;  continue present plan and medications.  3. Neurologic gait disorder Worsened.   4. Seizure disorder ([place]) Mgmt by neurology.  5. Moderate recurrent major depression ([facility]) The current medical regimen is effective;  continue present plan and medications.  6. Aphasia - MR Brain Wo Contrast; Future  7. Right hemiparesis ([place]) - MR Brain Wo Contrast; Future  8. Meningioma, cerebral ([place]) - new. - MR Brain Wo Contrast; Future  9. Needs flu shot - Flu Vaccine MDCK QUAD PF  Follow-up: Return in about 3 months (around 01/06/2020) for FASTING.    Rochel Brome, MD    ADDENDUM: Pt requires a gel overlay for his hospital bed due to his inability  to move and change positions due to his right hemiparesis. He also has impaired nutritional status, incontinence, altered sensory perception (hemiparesis) which put him at high risk for decubitus ulcers.

## 2019-10-08 LAB — CBC WITH DIFFERENTIAL/PLATELET
Basophils Absolute: 0.1 10*3/uL (ref 0.0–0.2)
Basos: 1 %
EOS (ABSOLUTE): 0.3 10*3/uL (ref 0.0–0.4)
Eos: 3 %
Hematocrit: 46.3 % (ref 37.5–51.0)
Hemoglobin: 15.4 g/dL (ref 13.0–17.7)
Immature Grans (Abs): 0.1 10*3/uL (ref 0.0–0.1)
Immature Granulocytes: 1 %
Lymphocytes Absolute: 1.8 10*3/uL (ref 0.7–3.1)
Lymphs: 21 %
MCH: 29.7 pg (ref 26.6–33.0)
MCHC: 33.3 g/dL (ref 31.5–35.7)
MCV: 89 fL (ref 79–97)
Monocytes Absolute: 0.6 10*3/uL (ref 0.1–0.9)
Monocytes: 7 %
Neutrophils Absolute: 5.9 10*3/uL (ref 1.4–7.0)
Neutrophils: 67 %
Platelets: 253 10*3/uL (ref 150–450)
RBC: 5.18 x10E6/uL (ref 4.14–5.80)
RDW: 13.9 % (ref 11.6–15.4)
WBC: 8.8 10*3/uL (ref 3.4–10.8)

## 2019-10-08 LAB — LIPID PANEL
Chol/HDL Ratio: 4.1 ratio (ref 0.0–5.0)
Cholesterol, Total: 126 mg/dL (ref 100–199)
HDL: 31 mg/dL — ABNORMAL LOW (ref >39)
LDL Chol Calc (NIH): 72 mg/dL (ref 0–99)
Triglycerides: 127 mg/dL (ref 0–149)
VLDL Cholesterol Cal: 23 mg/dL (ref 5–40)

## 2019-10-08 LAB — COMPREHENSIVE METABOLIC PANEL
ALT: 34 IU/L (ref 0–44)
AST: 26 IU/L (ref 0–40)
Albumin/Globulin Ratio: 2 (ref 1.2–2.2)
Albumin: 4.6 g/dL (ref 4.0–5.0)
Alkaline Phosphatase: 96 IU/L (ref 44–121)
BUN/Creatinine Ratio: 8 — ABNORMAL LOW (ref 9–20)
BUN: 9 mg/dL (ref 6–24)
Bilirubin Total: 0.5 mg/dL (ref 0.0–1.2)
CO2: 25 mmol/L (ref 20–29)
Calcium: 9 mg/dL (ref 8.7–10.2)
Chloride: 98 mmol/L (ref 96–106)
Creatinine, Ser: 1.16 mg/dL (ref 0.76–1.27)
GFR calc Af Amer: 85 mL/min/{1.73_m2} (ref >59)
GFR calc non Af Amer: 74 mL/min/{1.73_m2} (ref >59)
Globulin, Total: 2.3 g/dL (ref 1.5–4.5)
Glucose: 74 mg/dL (ref 65–99)
Potassium: 4.3 mmol/L (ref 3.5–5.2)
Sodium: 139 mmol/L (ref 134–144)
Total Protein: 6.9 g/dL (ref 6.0–8.5)

## 2019-10-08 LAB — CARDIOVASCULAR RISK ASSESSMENT

## 2019-10-14 DIAGNOSIS — D320 Benign neoplasm of cerebral meninges: Secondary | ICD-10-CM | POA: Diagnosis not present

## 2019-10-14 DIAGNOSIS — H524 Presbyopia: Secondary | ICD-10-CM | POA: Diagnosis not present

## 2019-10-14 DIAGNOSIS — H469 Unspecified optic neuritis: Secondary | ICD-10-CM | POA: Diagnosis not present

## 2019-10-15 ENCOUNTER — Telehealth: Payer: Self-pay

## 2019-10-15 NOTE — Telephone Encounter (Signed)
Left message for patients Mother Pamala Hurry) to call back. Per Dr. Tobie Poet patient needs to be seen by his Neurologist. Lelan Pons at [facility] states that patients shunt is not MRI compatible.

## 2019-10-24 ENCOUNTER — Telehealth: Payer: Self-pay | Admitting: *Deleted

## 2019-10-24 NOTE — Telephone Encounter (Signed)
LMVM for pts mother that SS/NP has 2 slot open 69 and 1315 if she wanted to bring Aahan in for appt.  Not holding slots, she will need to call and if still available to place him in.  Hope this works.

## 2019-10-28 ENCOUNTER — Other Ambulatory Visit: Payer: Self-pay | Admitting: Legal Medicine

## 2019-10-28 ENCOUNTER — Other Ambulatory Visit: Payer: Self-pay

## 2019-10-28 ENCOUNTER — Ambulatory Visit (INDEPENDENT_AMBULATORY_CARE_PROVIDER_SITE_OTHER): Payer: Medicare HMO | Admitting: Neurology

## 2019-10-28 ENCOUNTER — Encounter: Payer: Self-pay | Admitting: Neurology

## 2019-10-28 ENCOUNTER — Telehealth: Payer: Self-pay | Admitting: Neurology

## 2019-10-28 VITALS — BP 122/74 | HR 74

## 2019-10-28 DIAGNOSIS — G40909 Epilepsy, unspecified, not intractable, without status epilepticus: Secondary | ICD-10-CM | POA: Diagnosis not present

## 2019-10-28 DIAGNOSIS — R441 Visual hallucinations: Secondary | ICD-10-CM

## 2019-10-28 DIAGNOSIS — R443 Hallucinations, unspecified: Secondary | ICD-10-CM | POA: Diagnosis not present

## 2019-10-28 NOTE — Patient Instructions (Signed)
Continue the Lamictal 100 mg twice daily  I will review your scans with Dr. Krista Blue  See you back in 4 months

## 2019-10-28 NOTE — Progress Notes (Addendum)
PATIENT: Glen [name] DOB: [birth year]  REASON FOR VISIT: follow up HISTORY FROM: patient  HISTORY OF PRESENT ILLNESS: Today 10/28/19  HISTORY  Glen Novelo Lambethis a 49 year old male, accompanied by his mother Glen [name], seen in refer by his primary care PA Glen [name], for evaluation of seizure, Initial evaluation was on February 13, 2017.  I reviewed and summarized the referring note, he has past medical history of hyperlipidemia, hypothyroidism, testosterone deficiency, carried a diagnosis of autoimmune encephalomyelitis,  He was adopted by his mother at 21 days of age, 31, was able to review [name] record, he developed double vision, was diagnosed with fourth ventricle medulloblastoma, had surgery by Dr. Yetta [name], VP shunt replacement, this was followed by radiation therapy, he also had left parafalcine meningioma resected in 2004.  He had his first seizure in 1990s, has been treated with levetiracetam 750 mg twice a day, in September 2015, he had recurrent seizure, fever, confusion, was diagnosed with autoimmune cerebritis, was treated with Solu-Medrol 1 g daily for 5 days, and plasma exchange, Has regained significant recovery.   He had a brain biopsy as part of his workup for autoimmune cerebritis in 2015, which was consistent with gliosis/cerebritis, extensive autoimmune workup was negative, He also had a history of thrombocytopenia,  He had recurrent seizures, consistent of eye blinking, mouth drooling,  He was admitted to ICU in November 2015 for hypercapnic respiratory failure, decreased level of arousal, required intubation, brain MRI with contrast was suggestive of recurrent cerebritis, EEG excluded seizure, LP showed no signs of active infection, he was empirically treated with plasmapheresis, and his level of arousal improved with the treatment, his hospital course was also complicated by vent dependent, require tracheostomy, as well as C. 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, since his diagnosis of autoimmune cerebritis, he has significant decline in his functional status, with rehabilitation, he was eventually able to ambulate with walker, still with very unsteady ataxic gait, loss of hearing, slurred speech, difficulty reading, he had regained significant recovery from 2016, was able to begin to read his Bible again  Since November 2018, he was noted to have recurrent spells of whining from his sleep, seems to be scared, and bothered by his nightmares, but only happens at nighttime, when his mother checked on him, he was able to tell her about some dreams, there was no seizure activity noted.  Laboratory evaluations in January 2019, LDL was 91, hemoglobin was 15.2, normal CMP creatinine of 1.1, TSH was mildly elevated 5.92, testosterone level was less than 10  CAT scan of the brain 2005 following a seizure activity and possible fall, advanced atrophy with centralized volume loss, mild dilatation of the ventricular system, left frontal approach ventriculostomy, catheter tip terminated within the third ventricle, there is a ill-defined area of high attenuation adjacent to the mid aspect of ventriculostomy, within the left frontal lobe, and left basal ganglion, which likely represent intraparenchymal hemorrhage,  UPDATE July 19 2017: EEG on April 20 2017 was mildly abnormal. There is electrodiagnostic evidence of mild background slowing, indicating bi-hemisphere malfunction. There is no evidence of epileptiform discharge.  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, mild parenchymal brain volume loss,  left ventriculoperitoneal  shunt without hydrocephalus, scattered chronic parenchymal hemorrhages,  He was switched from [place] to Depakote in February 2019 attempting to better control of his mood disorder, anxiety, but on March 13, 2017 who was admitted to the event of hospital, following increased confusion, falling episode,  CT head without contrast, mild diffuse cortical atrophy, old right posterior parietal infarction, stable left ventriculostomy catheter with distal tip in the third ventricle, postsurgical change  Chest x-ray, no acute disease,  EKG, left ventricular hypertrophy, nonspecific T wave abnormality, prolonged QT,  Laboratory evaluations, hemoglobin of 14.4, WBC of 7.0, normal CMP, creatinine of 0.9  He was switched to Keppra 1500 mg twice a day liquid form, now he is back to his baseline,  Update March 26, 2018 SS: He is at today's appointment with his mother. She reports he is doing well. He is currently taking Keppra liquid 15 mils (1500 mg)twice daily,Vimpat 200 mg twice daily. He has not had any seizure episodes. He was taken off Depakote previously and switchedto Vimpat due to falling. His mother reports occasionally at night she will notice that he is reaching like he is pushing someone away in his sleep. He generally sleeps well. She has noticed that he pays close attention, studies paintings, church murals.He is to be a Company secretary. He uses a walker for ambulation, is unsteady, off balance. He has a great appetite, enjoys doing crossword puzzles, watching basketball. He is hard of hearing wearing hearing aids, very limited verbal response, difficult to understand. He had labs done recently had at his primary care doctor that werenormal. In September 2019 he had his gallbladder removed.  Update September 26, 2018 SS: His walker got hung coming into office today while his mother was pushing on the walker, hit his elbow (apparently didn't hit his  head), no seizure events, sleeps well at night, he lives with his mother, uses a walker, recently ordered wheelchair by Dr. Tessa [name]. His right leg gives away on him from previous stroke. During the day watches TV, puzzles, he enjoys church. Sometimes during the day he may feel restless in the car. He tried Seroquel and he was very drowsy.He remains on Vimpat, liquid Keppra.  Update July 08, 2019 SS: Here today with his mom, brother for evaluation of seizure medication.  He had been off Vimpat for at least 2 weeks. His mom felt for the last 6 weeks, he has been more agitated, having hallucinations at night, whining, having bad dreams, acting strange. He will look at her very sternly.  No falls.  No seizures that she knows of.  He remains on Keppra.  Since off Vimpat, behavioral issues have slightly improved. Review of chart, back to February, indicated discussed with Dr. Tessa [name] episodes of confusion following Vimpat and Keppra doses. Did have UTI a few months ago, this cleared, no other signs of infection, has been eating well, enjoys green tea. Cannot tolerate Depakote. Indicates saw PCP recently, lab work was good.  Update October 28, 2019 SS: We have stopped Vimpat and Keppra, is taking Lamictal 100 mg BID.  Is doing better, still has hallucinations, occasionally yelling out at night, mother worries he has had a stroke, since August, weakness to the right leg, has had some falls, not walking at home, using wheelchair, some speech changes, has been more depressed, his dad is in the hospital.  Is on Haldol, is helping.  Sent for CT head in August 2021, new 17 x 9 mm extra-axial mass over left frontal convexity, sent for MRI,  ventriculostomy was not MRI compatible per Oval Linsey, but has had MRIs before, recent as 2019. His mother got him a bible audio, it seems to make him more comfortable.  No seizures. His mom thinks his medications may be too strong, but is much better off Keppra and Vimpat.  REVIEW OF  SYSTEMS: Out of a complete 14 system review of symptoms, the patient complains only of the following symptoms, and all other reviewed systems are negative.  Hallucinations  ALLERGIES: Allergies  Allergen Reactions  . Heparin Anaphylaxis    Patient has HITT  . Lidocaine Hives  . Quetiapine Other (See Comments)    Confusion  . Depakote [Divalproex Sodium] Rash  . Phenytoin Sodium Extended Rash    HOME MEDICATIONS: Outpatient Medications Prior to Visit  Medication Sig Dispense Refill  . atorvastatin (LIPITOR) 20 MG tablet TAKE 1 TABLET BY MOUTH EVERY DAY 90 tablet 2  . FLUoxetine (PROZAC) 40 MG capsule TAKE 1 CAPSULE BY MOUTH TWICE A DAY 180 capsule 0  . haloperidol (HALDOL) 2 MG tablet Take 1 tablet (2 mg total) by mouth 2 (two) times daily. 60 tablet 2  . lamoTRIgine (LAMICTAL) 25 MG CHEW chewable tablet 1 tab bid x one week 2 tab bid x 2nd week 3 tab bid x 3rd week (Patient taking differently: Chew 100 mg by mouth daily. 1 tab bid x one week 2 tab bid x 2nd week 3 tab bid x 3rd week) 84 tablet 0  . levothyroxine (SYNTHROID) 50 MCG tablet TAKE 1 TABLET BY MOUTH DAILY BEFORE BREAKFAST 90 tablet 1  . loratadine (CLARITIN) 10 MG tablet Take 10 mg by mouth daily.  1  . polyethylene glycol (MIRALAX / GLYCOLAX) packet Take 17 g by mouth daily. 14 each 0  . testosterone cypionate (DEPOTESTOSTERONE CYPIONATE) 200 MG/ML injection Inject 1 mL (200 mg total) into the muscle every 14 (fourteen) days. 10 mL 0   No facility-administered medications prior to visit.    PAST MEDICAL HISTORY: Past Medical History:  Diagnosis Date  . Brain cancer ([place])   . Hiccough   . Hypercholesteremia   . Other elevated white blood cell count   . Other encephalitis and encephalomyelitis   . Posterior reversible encephalopathy syndrome   . Seizure ([place])   . Stroke ([place])   . Testicular hypofunction   . Thyroid disorder     PAST SURGICAL HISTORY: Past Surgical History:  Procedure Laterality Date  .  APPENDECTOMY     1980s  . BRAIN SURGERY     1993  . CRANIOTOMY Right 04/09/2017   Procedure: CRANIOTOMY HEMATOMA EVACUATION SUBDURAL;  Surgeon: Erline Levine, MD;  Location: [facility];  Service: Neurosurgery;  Laterality: Right;  . DIRECT LARYNGOSCOPY N/A 04/09/2017   Procedure: DIRECT LARYNGOSCOPY;  Surgeon: Erline Levine, MD;  Location: [facility];  Service: Neurosurgery;  Laterality: N/A;  . DIRECT LARYNGOSCOPY N/A 04/09/2017   Procedure: DIRECT LARYNGOSCOPY;  Surgeon: [name], MD;  Location: [facility];  Service: ENT;  Laterality: N/A;  . DIRECT LARYNGOSCOPY N/A 04/18/2017   Procedure: DIRECT LARYNGOSCOPY;  Surgeon: [name], MD;  Location: [facility];  Service: ENT;  Laterality: N/A;  . EYE SURGERY     X5  . TRACHEOSTOMY TUBE PLACEMENT N/A 04/18/2017   Procedure: TRANSNASAL FIBER OPTIC LARYNGOSCOPY;  Surgeon: [name], MD;  Location: [facility];  Service: ENT;  Laterality: N/A;    FAMILY HISTORY: Family History  Adopted: Yes  Family history unknown: Yes    SOCIAL HISTORY: Social History  Socioeconomic History  . Marital status: Divorced    Spouse name: Not on file  . Number of children: 1  . Years of education: college  . Highest education level: Not on file  Occupational History  . Occupation: Disabled  Tobacco Use  . Smoking status: Never Smoker  . Smokeless tobacco: Never Used  Vaping Use  . Vaping Use: Never used  Substance and Sexual Activity  . Alcohol use: No  . Drug use: Never  . Sexual activity: Not Currently  Other Topics Concern  . Not on file  Social History Narrative   Lives at home with parents.   Right-handed.   Occasional caffeine.   Social Determinants of Health   Financial Resource Strain:   . Difficulty of Paying Living Expenses: Not on file  Food Insecurity:   . Worried About Charity fundraiser in the Last Year: Not on file  . Ran Out of Food in the Last Year: Not on file  Transportation Needs:   . Lack of Transportation (Medical): Not on file   . Lack of Transportation (Non-Medical): Not on file  Physical Activity:   . Days of Exercise per Week: Not on file  . Minutes of Exercise per Session: Not on file  Stress:   . Feeling of Stress : Not on file  Social Connections:   . Frequency of Communication with Friends and Family: Not on file  . Frequency of Social Gatherings with Friends and Family: Not on file  . Attends Religious Services: Not on file  . Active Member of Clubs or Organizations: Not on file  . Attends Archivist Meetings: Not on file  . Marital Status: Not on file  Intimate Partner Violence:   . Fear of Current or Ex-Partner: Not on file  . Emotionally Abused: Not on file  . Physically Abused: Not on file  . Sexually Abused: Not on file   PHYSICAL EXAM  Vitals:   10/28/19 1317  BP: 122/74  Pulse: 74   There is no height or weight on file to calculate BMI.  Generalized: Well developed, in no acute distress   Neurological examination  Mentation: Alert, verbal response is limited, speech difficult to understand, is very hard of hearing, limited ability to follow commands Cranial nerve II-XII: Pupils were equal round reactive to light. Extraocular movements were full, visual field were full on confrontational test. Face is symmetric. Motor: Good strength in all extremities, no weakness of RLE appreciated, but he did have difficulty following commands Sensory: Sensory testing is intact to soft touch on all 4 extremities. No evidence of extinction is noted.  Coordination: Could not perform Gait and station: Was not ambulated Reflexes: Deep tendon reflexes are symmetric   DIAGNOSTIC DATA (LABS, IMAGING, TESTING) - I reviewed patient records, labs, notes, testing and imaging myself where available.  Lab Results  Component Value Date   WBC 8.8 10/07/2019   HGB 15.4 10/07/2019   HCT 46.3 10/07/2019   MCV 89 10/07/2019   PLT 253 10/07/2019      Component Value Date/Time   NA 139 10/07/2019  1211   K 4.3 10/07/2019 1211   CL 98 10/07/2019 1211   CO2 25 10/07/2019 1211   GLUCOSE 74 10/07/2019 1211   GLUCOSE 99 05/15/2017 0603   BUN 9 10/07/2019 1211   CREATININE 1.16 10/07/2019 1211   CALCIUM 9.0 10/07/2019 1211   PROT 6.9 10/07/2019 1211   ALBUMIN 4.6 10/07/2019 1211   AST 26 10/07/2019 1211  ALT 34 10/07/2019 1211   ALKPHOS 96 10/07/2019 1211   BILITOT 0.5 10/07/2019 1211   GFRNONAA 74 10/07/2019 1211   GFRAA 85 10/07/2019 1211   Lab Results  Component Value Date   CHOL 126 10/07/2019   HDL 31 (L) 10/07/2019   LDLCALC 72 10/07/2019   TRIG 127 10/07/2019   CHOLHDL 4.1 10/07/2019   Lab Results  Component Value Date   HGBA1C 5.5 12/24/2013   Lab Results  Component Value Date   VITAMINB12 882 12/24/2013   Lab Results  Component Value Date   TSH 2.610 03/28/2019   ASSESSMENT AND PLAN 49 y.o. year old male  has a past medical history of Brain cancer ([place]), Hiccough, Hypercholesteremia, Other elevated white blood cell count, Other encephalitis and encephalomyelitis, Posterior reversible encephalopathy syndrome, Seizure ([place]), Stroke [facility]), Testicular hypofunction, and Thyroid disorder. here with:  1.  Complex partial seizure, history of brainstem medulloblastoma, parafalcine meningioma, history of autoimmune encephalitis 2. Hallucinations, Right leg weakness, speech changes  -Side effect of hallucinations, behavioral issues, agitation with Keppra and Vimpat, previously could not tolerate Depakote, switched to Lamictal 100 mg twice a day, doing better, no seizures -CT head 08/23/2019 showed new 17 x 9 mm extra-axial mass over left frontal convexity, possibly meningioma, recommend contrast-enhanced MRI of the brain, has unchanged ventriculostomy catheter, will try to get MRI of the brain, he has had before back in 2019 at GI (talked to Dr. Krista Blue, will get noncontrast) -Around mid August, mother reports right leg weakness, concern for stroke, worsened speech (after  initial CT head), getting MRI of the brain -On Haldol from PCP for behavioral manifestations -Labs have not shown etiology for symptoms -Return in 4 months with Dr. Krista Blue or sooner if needed   I spent 30 minutes of face-to-face and non-face-to-face time with patient.  This included previsit chart review, lab review, study review, order entry, electronic health record documentation, patient education.  Butler Denmark, AGNP-C, DNP 10/28/2019, 1:45 PM [facility] [address], [place] [employer], [place] [address] [phone]  Reviewed, agree above.  Marcial Pacas, M.D. Ph.D.  [facility] [place], [place] [address] Phone: [phone] Fax:      [phone]

## 2019-10-28 NOTE — Telephone Encounter (Signed)
[employer] medicare/medicaid order sent to [facility]. They will obtain the auth and reach out to the patient to schedule.

## 2019-10-29 ENCOUNTER — Other Ambulatory Visit: Payer: Self-pay | Admitting: Neurology

## 2019-11-04 NOTE — Telephone Encounter (Signed)
I did peer to peer with Dr. Fayette Pho, MRI brain approved, [id number] good for 180 calendar days

## 2019-11-04 NOTE — Telephone Encounter (Signed)
[place] medicare is not approving the MRI Brain.. I called Dr. Rochel Brome office and they cancelled their MRI Brain with [employer] with the insurance but it is still not approving the one you have order. They informed me it would have to be a peer to peer. The phone number for the peer to peer is [phone] option 4. The case number is [id number]. They informed me it would be best to call today to get that schedule because it could go into denial.

## 2019-11-05 NOTE — Telephone Encounter (Signed)
Noted the patient is scheduled at GI on 11/10/19.

## 2019-11-07 ENCOUNTER — Telehealth: Payer: Self-pay | Admitting: Family Medicine

## 2019-11-10 ENCOUNTER — Other Ambulatory Visit: Payer: Self-pay

## 2019-11-10 ENCOUNTER — Other Ambulatory Visit: Payer: Self-pay | Admitting: Family Medicine

## 2019-11-10 ENCOUNTER — Ambulatory Visit
Admission: RE | Admit: 2019-11-10 | Discharge: 2019-11-10 | Disposition: A | Discharge status: Home / Self Care | Payer: Medicare HMO | Source: Ambulatory Visit | Attending: Neurology | Admitting: Neurology

## 2019-11-10 DIAGNOSIS — R443 Hallucinations, unspecified: Secondary | ICD-10-CM | POA: Diagnosis not present

## 2019-11-10 DIAGNOSIS — G40909 Epilepsy, unspecified, not intractable, without status epilepticus: Secondary | ICD-10-CM | POA: Diagnosis not present

## 2019-11-10 MED ORDER — LORATADINE 10 MG PO TABS
10.0000 mg | ORAL_TABLET | Freq: Every day | ORAL | 3 refills | Status: DC
Start: 2019-11-10 — End: 2019-12-23

## 2019-11-12 ENCOUNTER — Telehealth: Payer: Self-pay | Admitting: Neurology

## 2019-11-12 NOTE — Telephone Encounter (Signed)
MRI of the brain shows no acute findings, small left frontal fluid collection is reduced in size from subdural hematoma in 2019.  The right hemisphere subdural hematomas in 2019 have resolved.  Advanced atrophy is noted.  No new strokes are seen.   I received a letter in the mail from his mother, Pamala Hurry.  She expresses concerns that he is getting depressed, is not interested in doing anything, is not sleeping well, occasionally she notes crying noises at night.  Has difficulty trying to raise up to get out of bed.  He is to be taking Lamictal 100 mg twice a day.  We took him off Keppra and Vimpat due to mood issues.  For the depression, does he need to see pcp or referral to psychiatry?  Is on Prozac and Haldol. Need PT for mobility issues?   MRI of the brain 11/10/2019 IMPRESSION: This MRI of the brain without contrast shows the following: 1.   There are no acute findings. 2.   Small left frontal fluid collection, reduced in size from the subdural hematoma seen in 2019.  The right hemispheric and the falcotentorial subdural hematomas seen in 2019 have resolved. 3.   Patient is status post suboccipital craniotomy.  Intraventricular shunt placement on the left. 4.   Advanced generalized cortical atrophy and severe cerebellar atrophy. 5.   Encephalomalacia from chronic strokes in the right parieto-occipital-temporal region, left frontal lobe and inferior cerebellum.

## 2019-11-12 NOTE — Telephone Encounter (Signed)
I will discuss switching medications with Dr. Krista Blue, he previously could not tolerate Depakote; Keppra and Vimpat resulted in agitation, hallucinations, strange behavior.  Lamictal is a good mood stabilizer, also seizure medicine. All of the mood issues seemed to start May-June. He first stopped the Vimpat, then the [place]. She knows him well, if she thinks we need lower dose of Lamictal, we could try 75 mg BID, if she feels higher dose is the issue.

## 2019-11-12 NOTE — Telephone Encounter (Addendum)
Spoke to wife.  Pt is on lamicatl 100mg  po bid.  She stated that she felt has he was titrated up on the dose of lamictal she felt that this could be the cause of what he is feeling now.  (this not mentioned in ofv visit).  I told her that I would relay to SS/NP.  For now monitor.  Has also to family stressors now.  pcp involved with giving prozac and haldol.  If any change to plan will call her back otherwise for now monitor.  She appreciated call. No seizures.  He is not getting PT. Did relay MRI results as stable.

## 2019-11-12 NOTE — Telephone Encounter (Signed)
[name],Glen has called in response to [employer].  Please call her back

## 2019-11-12 NOTE — Telephone Encounter (Signed)
MRI stable per SS/NP.   ? On medications.  Please call back at convenience.

## 2019-11-13 MED ORDER — LAMOTRIGINE 25 MG PO TABS: 25.0000 mg | ORAL_TABLET | Freq: Two times a day (BID) | ORAL | 1 refills | Status: DC

## 2019-11-13 NOTE — Addendum Note (Signed)
Addended by: Suzzanne Cloud on: 11/13/2019 02:28 PM   Modules accepted: Orders

## 2019-11-13 NOTE — Addendum Note (Signed)
Addended by: Brandon Melnick on: 11/13/2019 11:45 AM   Modules accepted: Orders

## 2019-11-13 NOTE — Telephone Encounter (Signed)
Spoke to mother of pt.  She states that she feels like he is getting too much of the lamictal was having some tremory this am, (similar sx when on other seizure meds before).  I relayed that SS/NP to discuss with Dr. Krista Blue next week, for now recommended can decrease to 75mg  po bid.  Will take 1/2 of 100mg  tablet BID and then one 25mg  po bid. (total 75mg  po bid).

## 2019-12-05 ENCOUNTER — Other Ambulatory Visit: Payer: Self-pay | Admitting: Family Medicine

## 2019-12-05 DIAGNOSIS — F79 Unspecified intellectual disabilities: Secondary | ICD-10-CM

## 2019-12-05 DIAGNOSIS — M2450 Contracture, unspecified joint: Secondary | ICD-10-CM

## 2019-12-06 ENCOUNTER — Other Ambulatory Visit: Payer: Self-pay | Admitting: Neurology

## 2019-12-09 NOTE — Progress Notes (Signed)
I have reviewed and agreed above plan. 

## 2019-12-19 ENCOUNTER — Other Ambulatory Visit: Payer: Self-pay

## 2019-12-19 ENCOUNTER — Emergency Department (HOSPITAL_COMMUNITY): Payer: Medicare HMO

## 2019-12-19 ENCOUNTER — Inpatient Hospital Stay (HOSPITAL_COMMUNITY)
Admission: EM | Admit: 2019-12-19 | Discharge: 2019-12-23 | DRG: 064 | Disposition: A | Discharge status: Hospice | Payer: Medicare HMO | Attending: Internal Medicine | Admitting: Internal Medicine

## 2019-12-19 ENCOUNTER — Inpatient Hospital Stay (HOSPITAL_COMMUNITY): Payer: Medicare HMO

## 2019-12-19 DIAGNOSIS — Z20822 Contact with and (suspected) exposure to covid-19: Secondary | ICD-10-CM | POA: Diagnosis present

## 2019-12-19 DIAGNOSIS — R627 Adult failure to thrive: Secondary | ICD-10-CM | POA: Diagnosis present

## 2019-12-19 DIAGNOSIS — I6783 Posterior reversible encephalopathy syndrome: Secondary | ICD-10-CM | POA: Diagnosis present

## 2019-12-19 DIAGNOSIS — I69354 Hemiplegia and hemiparesis following cerebral infarction affecting left non-dominant side: Secondary | ICD-10-CM | POA: Diagnosis not present

## 2019-12-19 DIAGNOSIS — T17908A Unspecified foreign body in respiratory tract, part unspecified causing other injury, initial encounter: Secondary | ICD-10-CM

## 2019-12-19 DIAGNOSIS — R29810 Facial weakness: Secondary | ICD-10-CM | POA: Diagnosis present

## 2019-12-19 DIAGNOSIS — Z6830 Body mass index (BMI) 30.0-30.9, adult: Secondary | ICD-10-CM

## 2019-12-19 DIAGNOSIS — R1310 Dysphagia, unspecified: Secondary | ICD-10-CM | POA: Diagnosis present

## 2019-12-19 DIAGNOSIS — D320 Benign neoplasm of cerebral meninges: Secondary | ICD-10-CM | POA: Diagnosis present

## 2019-12-19 DIAGNOSIS — G40909 Epilepsy, unspecified, not intractable, without status epilepticus: Secondary | ICD-10-CM | POA: Diagnosis present

## 2019-12-19 DIAGNOSIS — Z982 Presence of cerebrospinal fluid drainage device: Secondary | ICD-10-CM | POA: Diagnosis not present

## 2019-12-19 DIAGNOSIS — Z79890 Hormone replacement therapy (postmenopausal): Secondary | ICD-10-CM | POA: Diagnosis not present

## 2019-12-19 DIAGNOSIS — Z515 Encounter for palliative care: Secondary | ICD-10-CM

## 2019-12-19 DIAGNOSIS — Z888 Allergy status to other drugs, medicaments and biological substances status: Secondary | ICD-10-CM | POA: Diagnosis not present

## 2019-12-19 DIAGNOSIS — Z5320 Procedure and treatment not carried out because of patient's decision for unspecified reasons: Secondary | ICD-10-CM | POA: Diagnosis present

## 2019-12-19 DIAGNOSIS — Z7189 Other specified counseling: Secondary | ICD-10-CM | POA: Diagnosis not present

## 2019-12-19 DIAGNOSIS — R4182 Altered mental status, unspecified: Secondary | ICD-10-CM | POA: Diagnosis not present

## 2019-12-19 DIAGNOSIS — R4701 Aphasia: Secondary | ICD-10-CM | POA: Diagnosis present

## 2019-12-19 DIAGNOSIS — Z9049 Acquired absence of other specified parts of digestive tract: Secondary | ICD-10-CM

## 2019-12-19 DIAGNOSIS — E7800 Pure hypercholesterolemia, unspecified: Secondary | ICD-10-CM | POA: Diagnosis present

## 2019-12-19 DIAGNOSIS — E876 Hypokalemia: Secondary | ICD-10-CM | POA: Diagnosis present

## 2019-12-19 DIAGNOSIS — E785 Hyperlipidemia, unspecified: Secondary | ICD-10-CM | POA: Diagnosis present

## 2019-12-19 DIAGNOSIS — I6381 Other cerebral infarction due to occlusion or stenosis of small artery: Secondary | ICD-10-CM | POA: Diagnosis present

## 2019-12-19 DIAGNOSIS — Z993 Dependence on wheelchair: Secondary | ICD-10-CM

## 2019-12-19 DIAGNOSIS — I6389 Other cerebral infarction: Secondary | ICD-10-CM | POA: Diagnosis not present

## 2019-12-19 DIAGNOSIS — Z66 Do not resuscitate: Secondary | ICD-10-CM | POA: Diagnosis present

## 2019-12-19 DIAGNOSIS — Z8661 Personal history of infections of the central nervous system: Secondary | ICD-10-CM | POA: Diagnosis not present

## 2019-12-19 DIAGNOSIS — I639 Cerebral infarction, unspecified: Secondary | ICD-10-CM

## 2019-12-19 DIAGNOSIS — Z85841 Personal history of malignant neoplasm of brain: Secondary | ICD-10-CM | POA: Diagnosis not present

## 2019-12-19 DIAGNOSIS — E039 Hypothyroidism, unspecified: Secondary | ICD-10-CM | POA: Diagnosis present

## 2019-12-19 DIAGNOSIS — R29723 NIHSS score 23: Secondary | ICD-10-CM | POA: Diagnosis present

## 2019-12-19 DIAGNOSIS — Z79899 Other long term (current) drug therapy: Secondary | ICD-10-CM

## 2019-12-19 DIAGNOSIS — I6302 Cerebral infarction due to thrombosis of basilar artery: Secondary | ICD-10-CM | POA: Diagnosis not present

## 2019-12-19 LAB — I-STAT CHEM 8, ED
BUN: 8 mg/dL (ref 6–20)
Calcium, Ion: 1.03 mmol/L — ABNORMAL LOW (ref 1.15–1.40)
Chloride: 106 mmol/L (ref 98–111)
Creatinine, Ser: 1.1 mg/dL (ref 0.61–1.24)
Glucose, Bld: 86 mg/dL (ref 70–99)
HCT: 39 % (ref 39.0–52.0)
Hemoglobin: 13.3 g/dL (ref 13.0–17.0)
Potassium: 3.1 mmol/L — ABNORMAL LOW (ref 3.5–5.1)
Sodium: 141 mmol/L (ref 135–145)
TCO2: 21 mmol/L — ABNORMAL LOW (ref 22–32)

## 2019-12-19 LAB — CBC
HCT: 40.9 % (ref 39.0–52.0)
Hemoglobin: 13.3 g/dL (ref 13.0–17.0)
MCH: 29 pg (ref 26.0–34.0)
MCHC: 32.5 g/dL (ref 30.0–36.0)
MCV: 89.3 fL (ref 80.0–100.0)
Platelets: 198 10*3/uL (ref 150–400)
RBC: 4.58 MIL/uL (ref 4.22–5.81)
RDW: 12.5 % (ref 11.5–15.5)
WBC: 8.5 10*3/uL (ref 4.0–10.5)
nRBC: 0 % (ref 0.0–0.2)

## 2019-12-19 LAB — URINALYSIS, ROUTINE W REFLEX MICROSCOPIC
Bilirubin Urine: NEGATIVE
Glucose, UA: NEGATIVE mg/dL
Hgb urine dipstick: NEGATIVE
Ketones, ur: NEGATIVE mg/dL
Leukocytes,Ua: NEGATIVE
Nitrite: NEGATIVE
Protein, ur: NEGATIVE mg/dL
Specific Gravity, Urine: 1.019 (ref 1.005–1.030)
pH: 5 (ref 5.0–8.0)

## 2019-12-19 LAB — RAPID URINE DRUG SCREEN, HOSP PERFORMED
Amphetamines: NOT DETECTED
Barbiturates: NOT DETECTED
Benzodiazepines: NOT DETECTED
Cocaine: NOT DETECTED
Opiates: NOT DETECTED
Tetrahydrocannabinol: NOT DETECTED

## 2019-12-19 LAB — COMPREHENSIVE METABOLIC PANEL
ALT: 37 U/L (ref 0–44)
AST: 27 U/L (ref 15–41)
Albumin: 3.1 g/dL — ABNORMAL LOW (ref 3.5–5.0)
Alkaline Phosphatase: 64 U/L (ref 38–126)
Anion gap: 11 (ref 5–15)
BUN: 8 mg/dL (ref 6–20)
CO2: 22 mmol/L (ref 22–32)
Calcium: 8.2 mg/dL — ABNORMAL LOW (ref 8.9–10.3)
Chloride: 107 mmol/L (ref 98–111)
Creatinine, Ser: 1.21 mg/dL (ref 0.61–1.24)
GFR, Estimated: >60 mL/min (ref >60)
Glucose, Bld: 91 mg/dL (ref 70–99)
Potassium: 3.3 mmol/L — ABNORMAL LOW (ref 3.5–5.1)
Sodium: 140 mmol/L (ref 135–145)
Total Bilirubin: 0.6 mg/dL (ref 0.3–1.2)
Total Protein: 5.4 g/dL — ABNORMAL LOW (ref 6.5–8.1)

## 2019-12-19 LAB — DIFFERENTIAL
Abs Immature Granulocytes: 0.04 10*3/uL (ref 0.00–0.07)
Basophils Absolute: 0 10*3/uL (ref 0.0–0.1)
Basophils Relative: 1 %
Eosinophils Absolute: 0.2 10*3/uL (ref 0.0–0.5)
Eosinophils Relative: 2 %
Immature Granulocytes: 1 %
Lymphocytes Relative: 24 %
Lymphs Abs: 2 10*3/uL (ref 0.7–4.0)
Monocytes Absolute: 0.6 10*3/uL (ref 0.1–1.0)
Monocytes Relative: 7 %
Neutro Abs: 5.6 10*3/uL (ref 1.7–7.7)
Neutrophils Relative %: 65 %

## 2019-12-19 LAB — PROTIME-INR
INR: 1.1 (ref 0.8–1.2)
Prothrombin Time: 13.9 seconds (ref 11.4–15.2)

## 2019-12-19 LAB — ETHANOL: Alcohol, Ethyl (B): <10 mg/dL (ref <10)

## 2019-12-19 LAB — RESP PANEL BY RT-PCR (FLU A&B, COVID) ARPGX2
Influenza A by PCR: NEGATIVE
Influenza B by PCR: NEGATIVE
SARS Coronavirus 2 by RT PCR: NEGATIVE

## 2019-12-19 LAB — CBG MONITORING, ED: Glucose-Capillary: 96 mg/dL (ref 70–99)

## 2019-12-19 LAB — APTT: aPTT: 31 seconds (ref 24–36)

## 2019-12-19 MED ORDER — GADOBUTROL 1 MMOL/ML IV SOLN
7.0000 mL | Freq: Once | INTRAVENOUS | Status: AC | PRN
  Administered 2019-12-19: 7 mL via INTRAVENOUS

## 2019-12-19 MED ORDER — ENOXAPARIN SODIUM 40 MG/0.4ML ~~LOC~~ SOLN: 40.0000 mg | SUBCUTANEOUS | Status: DC

## 2019-12-19 MED ORDER — ATORVASTATIN CALCIUM 10 MG PO TABS
20.0000 mg | ORAL_TABLET | Freq: Every day | ORAL | Status: DC
  Filled 2019-12-19 (×2): qty 2

## 2019-12-19 MED ORDER — LAMOTRIGINE 25 MG PO TABS: 75.0000 mg | ORAL_TABLET | Freq: Two times a day (BID) | ORAL | Status: DC

## 2019-12-19 MED ORDER — ACETAMINOPHEN 325 MG PO TABS: 650.0000 mg | ORAL_TABLET | ORAL | Status: DC | PRN

## 2019-12-19 MED ORDER — LORATADINE 10 MG PO TABS
10.0000 mg | ORAL_TABLET | Freq: Every day | ORAL | Status: DC
  Filled 2019-12-19 (×2): qty 1

## 2019-12-19 MED ORDER — HALOPERIDOL 0.5 MG PO TABS
2.0000 mg | ORAL_TABLET | Freq: Two times a day (BID) | ORAL | Status: DC
  Filled 2019-12-19 (×3): qty 4

## 2019-12-19 MED ORDER — SENNOSIDES-DOCUSATE SODIUM 8.6-50 MG PO TABS: 1.0000 | ORAL_TABLET | Freq: Every evening | ORAL | Status: DC | PRN

## 2019-12-19 MED ORDER — TESTOSTERONE CYPIONATE 200 MG/ML IM SOLN: 200.0000 mg | INTRAMUSCULAR | Status: DC

## 2019-12-19 MED ORDER — STROKE: EARLY STAGES OF RECOVERY BOOK
Freq: Once | Status: AC
  Filled 2019-12-19: qty 1

## 2019-12-19 MED ORDER — SODIUM CHLORIDE 0.9 % IV BOLUS
1000.0000 mL | Freq: Once | INTRAVENOUS | Status: AC
  Administered 2019-12-19: 1000 mL via INTRAVENOUS

## 2019-12-19 MED ORDER — ACETAMINOPHEN 160 MG/5ML PO SOLN: 650.0000 mg | ORAL | Status: DC | PRN

## 2019-12-19 MED ORDER — SODIUM CHLORIDE 0.9 % IV SOLN: INTRAVENOUS | Status: AC

## 2019-12-19 MED ORDER — ASPIRIN 300 MG RE SUPP
300.0000 mg | Freq: Every day | RECTAL | Status: DC
  Administered 2019-12-19 – 2019-12-20 (×2): 300 mg via RECTAL
  Filled 2019-12-19: qty 1

## 2019-12-19 MED ORDER — POLYETHYLENE GLYCOL 3350 17 G PO PACK
17.0000 g | PACK | Freq: Every day | ORAL | Status: DC
  Filled 2019-12-19 (×2): qty 1

## 2019-12-19 MED ORDER — POTASSIUM CHLORIDE 10 MEQ/100ML IV SOLN
10.0000 meq | INTRAVENOUS | Status: AC
  Administered 2019-12-19 (×2): 10 meq via INTRAVENOUS
  Filled 2019-12-19 (×2): qty 100

## 2019-12-19 MED ORDER — FLUOXETINE HCL 20 MG PO CAPS
40.0000 mg | ORAL_CAPSULE | Freq: Two times a day (BID) | ORAL | Status: DC
  Filled 2019-12-19 (×5): qty 2

## 2019-12-19 MED ORDER — LAMOTRIGINE 25 MG PO TABS
125.0000 mg | ORAL_TABLET | Freq: Two times a day (BID) | ORAL | Status: DC
  Filled 2019-12-19 (×5): qty 1

## 2019-12-19 MED ORDER — ACETAMINOPHEN 650 MG RE SUPP
650.0000 mg | RECTAL | Status: DC | PRN
  Administered 2019-12-19: 650 mg via RECTAL
  Filled 2019-12-19: qty 1

## 2019-12-19 MED ORDER — LEVOTHYROXINE SODIUM 50 MCG PO TABS
50.0000 ug | ORAL_TABLET | Freq: Every day | ORAL | Status: DC
  Filled 2019-12-19: qty 1

## 2019-12-19 NOTE — H&P (Addendum)
History and Physical    Glen [name] VOH:[id number] DOB: 03/11/1970 DOA: 12/19/2019  PCP: Rochel Brome, MD (Confirm with patient/family/NH records and if not entered, this has to be entered at [facility] point of entry) Patient coming from: Home  I have personally briefly reviewed patient's old medical records in [place]  Chief Complaint: Choking and left-sided weakness  HPI: Glen [name] is a 49 y.o. male with medical history significant of fourth ventricle medulla blastoma, status post surgery in 1995, left sided meningioma status post resection in 2004, seizure disorder, chronic ambulatory dysfunction on roller walker, hypothyroidism, presented with new onset of left-sided weakness, choking on food and overall mental status.  Patient awake but unable to talk, most of the history provided by patient mother over the phone.  Mother lives with patient who noticed that for the past week patient has been very shaky, slow on movement and very sluggish on his hands, always dropping his fork and spoon when eating.  Around noontime today, while eating lunch, patient suddenly choke on his food, having strenuous coughing and became unresponsive, head leaning toward right side and unable to move left-sided arm or leg.  Mother also reported that for the last week, patient has had trouble eating his food with frequent cough after swallowing, but never as severe as today.  EMS arrived and called code stroke ED Course: Remains nonverbal, left-sided weakness.  MRI is been done  Review of Systems: Unable to perform patient nonverbal  Past Medical History:  Diagnosis Date  . Brain cancer ([place])   . Hiccough   . Hypercholesteremia   . Other elevated white blood cell count   . Other encephalitis and encephalomyelitis   . Posterior reversible encephalopathy syndrome   . Seizure ([place])   . Stroke ([place])   . Testicular hypofunction   . Thyroid disorder     Past Surgical History:  Procedure  Laterality Date  . APPENDECTOMY     1980s  . BRAIN SURGERY     1993  . CRANIOTOMY Right 04/09/2017   Procedure: CRANIOTOMY HEMATOMA EVACUATION SUBDURAL;  Surgeon: Erline Levine, MD;  Location: [place];  Service: Neurosurgery;  Laterality: Right;  . DIRECT LARYNGOSCOPY N/A 04/09/2017   Procedure: DIRECT LARYNGOSCOPY;  Surgeon: Erline Levine, MD;  Location: [place];  Service: Neurosurgery;  Laterality: N/A;  . DIRECT LARYNGOSCOPY N/A 04/09/2017   Procedure: DIRECT LARYNGOSCOPY;  Surgeon: [name], MD;  Location: [place];  Service: ENT;  Laterality: N/A;  . DIRECT LARYNGOSCOPY N/A 04/18/2017   Procedure: DIRECT LARYNGOSCOPY;  Surgeon: [name], MD;  Location: [place];  Service: ENT;  Laterality: N/A;  . EYE SURGERY     X5  . TRACHEOSTOMY TUBE PLACEMENT N/A 04/18/2017   Procedure: TRANSNASAL FIBER OPTIC LARYNGOSCOPY;  Surgeon: [name], MD;  Location: [place];  Service: ENT;  Laterality: N/A;     reports that he has never smoked. He has never used smokeless tobacco. He reports that he does not drink alcohol and does not use drugs.  Allergies  Allergen Reactions  . Heparin Anaphylaxis    Patient has HITT  . Lidocaine Hives  . Quetiapine Other (See Comments)    Confusion  . Depakote [Divalproex Sodium] Rash  . Phenytoin Sodium Extended Rash    Family History  Adopted: Yes  Family history unknown: Yes    Prior to Admission medications   Medication Sig Start Date End Date Taking? Authorizing Provider  atorvastatin (LIPITOR) 20 MG tablet TAKE 1 TABLET  BY MOUTH EVERY DAY Patient taking differently: Take 20 mg by mouth daily.  07/31/19   Lillard Anes, MD  FLUoxetine (PROZAC) 40 MG capsule TAKE 1 CAPSULE BY MOUTH TWICE A DAY Patient taking differently: Take 40 mg by mouth 2 (two) times daily.  10/02/19   Marge Duncans, PA-C  haloperidol (HALDOL) 2 MG tablet TAKE 1 TABLET (2 MG TOTAL) BY MOUTH 2 (TWO) TIMES DAILY. 10/28/19   Lillard Anes, MD  lamoTRIgine (LAMICTAL) 25 MG  tablet TAKE 1 TABLET (25 MG TOTAL) BY MOUTH 2 (TWO) TIMES DAILY. TAKE WITH 1/2 TABLET (100MG  TABLET) TO MAKE 75MG  TWICE DAILY. 12/09/19   Suzzanne Cloud, NP  lamoTRIgine 100 MG TBDP Take 50 mg by mouth 2 (two) times daily. Take with 25mg  tablet for a total of 75mg  twice daily. 11/10/19   [provider]  levothyroxine (SYNTHROID) 50 MCG tablet TAKE 1 TABLET BY MOUTH DAILY BEFORE BREAKFAST Patient taking differently: Take 50 mcg by mouth daily before breakfast.  09/27/19   Cox, Kirsten, MD  loratadine (CLARITIN) 10 MG tablet Take 1 tablet (10 mg total) by mouth daily. 11/10/19   Cox, Elnita Maxwell, MD  polyethylene glycol (MIRALAX / Floria Raveling) packet Take 17 g by mouth daily. 05/16/17   Angiulli, Lavon Paganini, PA-C  testosterone cypionate (DEPOTESTOSTERONE CYPIONATE) 200 MG/ML injection Inject 1 mL (200 mg total) into the muscle every 14 (fourteen) days. 09/18/19   Rochel Brome, MD    Physical Exam: Vitals:   12/19/19 1715 12/19/19 1730 12/19/19 1745 12/19/19 1800  BP: 106/60 107/66  99/67  Pulse: 66 69 70 70  Resp: 16 18 20 20   SpO2: 93% 92% 92% 92%    Constitutional: NAD, calm, comfortable Vitals:   12/19/19 1715 12/19/19 1730 12/19/19 1745 12/19/19 1800  BP: 106/60 107/66  99/67  Pulse: 66 69 70 70  Resp: 16 18 20 20   SpO2: 93% 92% 92% 92%   Eyes: PERRL, lids and conjunctivae normal ENMT: Mucous membranes are moist. Posterior pharynx clear of any exudate or lesions.Normal dentition.  Neck: normal, supple, no masses, no thyromegaly Respiratory: clear to auscultation bilaterally, no wheezing, no crackles. Normal respiratory effort. No accessory muscle use.  Cardiovascular: Regular rate and rhythm, no murmurs / rubs / gallops. No extremity edema. 2+ pedal pulses. No carotid bruits.  Abdomen: no tenderness, no masses palpated. No hepatosplenomegaly. Bowel sounds positive.  Musculoskeletal: no clubbing / cyanosis. No joint deformity upper and lower extremities. Good ROM, no contractures.  Normal muscle tone.  Skin: no rashes, lesions, ulcers. No induration Neurologic: Left-sided weakness, slight left facial droop.   Psychiatric: Arousable, unable to answer any questions    Labs on Admission: I have personally reviewed following labs and imaging studies  CBC: Recent Labs  Lab 12/19/19 1432 12/19/19 1445  WBC 8.5  --   NEUTROABS 5.6  --   HGB 13.3 13.3  HCT 40.9 39.0  MCV 89.3  --   PLT 198  --    Basic Metabolic Panel: Recent Labs  Lab 12/19/19 1432 12/19/19 1445  NA 140 141  K 3.3* 3.1*  CL 107 106  CO2 22  --   GLUCOSE 91 86  BUN 8 8  CREATININE 1.21 1.10  CALCIUM 8.2*  --    GFR: CrCl cannot be calculated (Unknown ideal weight.). Liver Function Tests: Recent Labs  Lab 12/19/19 1432  AST 27  ALT 37  ALKPHOS 64  BILITOT 0.6  PROT 5.4*  ALBUMIN 3.1*   No results  for input(s): LIPASE, AMYLASE in the last 168 hours. No results for input(s): AMMONIA in the last 168 hours. Coagulation Profile: Recent Labs  Lab 12/19/19 1432  INR 1.1   Cardiac Enzymes: No results for input(s): CKTOTAL, CKMB, CKMBINDEX, TROPONINI in the last 168 hours. BNP (last 3 results) No results for input(s): PROBNP in the last 8760 hours. HbA1C: No results for input(s): HGBA1C in the last 72 hours. CBG: Recent Labs  Lab 12/19/19 1432  GLUCAP 96   Lipid Profile: No results for input(s): CHOL, HDL, LDLCALC, TRIG, CHOLHDL, LDLDIRECT in the last 72 hours. Thyroid Function Tests: No results for input(s): TSH, T4TOTAL, FREET4, T3FREE, THYROIDAB in the last 72 hours. Anemia Panel: No results for input(s): VITAMINB12, FOLATE, FERRITIN, TIBC, IRON, RETICCTPCT in the last 72 hours. Urine analysis:    Component Value Date/Time   COLORURINE YELLOW 12/19/2019 1631   APPEARANCEUR HAZY (A) 12/19/2019 1631   LABSPEC 1.019 12/19/2019 1631   PHURINE 5.0 12/19/2019 1631   GLUCOSEU NEGATIVE 12/19/2019 1631   HGBUR NEGATIVE 12/19/2019 1631   BILIRUBINUR NEGATIVE 12/19/2019  1631   BILIRUBINUR negative 08/23/2019 1050   KETONESUR NEGATIVE 12/19/2019 1631   PROTEINUR NEGATIVE 12/19/2019 1631   UROBILINOGEN 1.0 08/23/2019 1050   UROBILINOGEN 0.2 01/06/2014 1404   NITRITE NEGATIVE 12/19/2019 1631   LEUKOCYTESUR NEGATIVE 12/19/2019 1631    Radiological Exams on Admission: CT HEAD CODE STROKE WO CONTRAST  Result Date: 12/19/2019 CLINICAL DATA:  Code stroke. Acute neuro deficit. Left arm weakness. Left facial droop. History of craniotomy for subdural hematoma. History of brain cancer. EXAM: CT HEAD WITHOUT CONTRAST TECHNIQUE: Contiguous axial images were obtained from the base of the skull through the vertex without intravenous contrast. COMPARISON:  CT head 08/23/2019 FINDINGS: Brain: Left frontal ventricular shunt passes through the third ventral into the suprasellar cistern. This is unchanged in position. No hydrocephalus. Ventricle size stable. Small low-density chronic subdural fluid collection on the left is stable. No acute subdural hemorrhage. 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 Encephalomalacia right parietal lobe is unchanged. Extensive atrophy in the cerebellum bilaterally also unchanged. 16 mm dural based mass in the left frontal lobe unchanged from prior studies compatible with meningioma. No brain edema. Calcification in the basal ganglia bilaterally left greater than right stable. Negative for acute infarct.  Negative for acute hemorrhage. Vascular: Negative for hyperdense vessel Skull: Suboccipital craniectomy on the left. Large right hemispheric craniotomy. Sinuses/Orbits: Mild mucosal edema paranasal sinuses. Normal orbit bilaterally. Other: None ASPECTS ([place] Stroke Program Early CT Score) - Ganglionic level infarction (caudate, lentiform nuclei, internal capsule, insula, M1-M3 cortex): 7 - Supraganglionic infarction (M4-M6 cortex): 3 Total score (0-10 with 10 being normal): 10 IMPRESSION: 1. No acute abnormality and no change from  the prior CT. 2. ASPECTS is 10 3. Extensive postsurgical changes with right craniotomy and left suboccipital craniectomy. 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 4. 16 mm left frontal meningioma unchanged. 5. Code stroke imaging results were communicated on 12/19/2019 at 2:51 pm to provider Rory Percy via [place] Electronically Signed   By: Franchot Gallo M.D.   On: 12/19/2019 14:54    EKG: Independently reviewed.  Sinus, nonspecific ST-T changes  Assessment/Plan Active Problems:   CVA (cerebral vascular accident) ([place])  (please populate well all problems here in Problem List. (For example, if patient is on BP meds at home and you resume or decide to hold them, it is a problem that needs to be her. Same for CAD, COPD, HLD and so  on)  Acute CVA, left-sided weakness, acute dysphagia and acute aphasia -Discussed with on-call neurologist, who read MRI initially impression is acute pontine stroke -No signs of intracranial bleeding, neurology ordered aspirin 325, will be administered suppository as patient appears to have acute dysphagia. -N.p.o. for now, IV fluid -No BP meds -A1c and lipid panel -Order chest x-ray, no hypoxia, no leukocytosis, will monitor off antibiotics for tonight.  Low threshold to start antibiotics in case patient developed fever, hypoxia.  Seizure disorder -Continue Lamictal  Hypothyroidism -Continue Synthroid  Hypokalemia -Replace and recheck  History of brain tumor, involving meningioma and medulloblastoma -No acute issue  Hx of cerebritis -Etiology with one of the differentials neurology concern about today.  But given the acute MRI findings of stroke, cerebritis is unlikely. -Patient has been following with outpatient neurologist, MRI done on October 25 showed no acute finding.  DVT prophylaxis: Lovenox Code Status: Full code Family Communication: Mother over phone Disposition Plan: Patient has acute pontine CVA knocking out much of the mobility,  along with worsening of dysphagia and aphasia, likely will need more than 2 midnight hospital stay, expect eventually go to a rehab vs SNF Consults called: Neurology Admission status: Telemetry admission   Lequita Halt MD [facility] Pager [phone]  12/19/2019, 7:04 PM  .

## 2019-12-19 NOTE — Progress Notes (Signed)
Stat  EEG complete - results pending.  

## 2019-12-19 NOTE — ED Notes (Signed)
Patient in MRI 

## 2019-12-19 NOTE — Code Documentation (Signed)
Stroke Response Nurse Documentation Code Documentation  TRAXTON KOLENDA is a 49 y.o. male arriving to [place]. [facility] ED via [facility] on 12/19/2019 with past medical hx of brain tumor with resection, SAH in 2019, stroke, and seizures. Code stroke was activated by EMS. Patient from home where he was LKW at 1245. Family reports that he started choking on food and was able to dislodge it after about 30 seconds. After the episode, pt noted to have new left arm and leg weakness. Family called EMS and activated a Code Stroke.   Stroke team at the bedside on patient arrival. Labs drawn and patient cleared for CT by Dr. Tyrone Nine. Patient to CT with team. NIHSS 23, see documentation for details and code stroke times. Patient with disoriented, not following commands, left gaze preference , left facial droop, left arm weakness, left leg weakness, Global aphasia  and dysarthria  on exam. Pt has baseline aphasia and some right sided symptoms per family, but no left sided weakness prior to today. The following imaging was completed: CT. Patient is not a candidate for tPA due to having history of recent [place].  Care/Plan: q2 mNHISS/VS, STAT EEG. Bedside handoff with ED RN Aldona Bar.    Kathrin Greathouse  Stroke Response RN

## 2019-12-19 NOTE — ED Triage Notes (Signed)
Patient arrived via [facility] from home. EMS stated that family said patient last known normal was 64 and that he was eating and got choked and afterwards he was not himself and that he had left sided weakness. Patient has a history of brain cancer and have had multiple brain surgeries.

## 2019-12-19 NOTE — ED Provider Notes (Signed)
[facility] EMERGENCY DEPARTMENT Provider Note   CSN: [id number] Arrival date & time: 12/19/19  1429  An emergency department physician performed an initial assessment on this suspected stroke patient at 1432.  History Chief Complaint  Patient presents with  . Code Stroke    KHALIK PEWITT is a 49 y.o. male.  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 Alveda Reasons shunt placement, left parafalcine meningioma, seizures, hx of ICH, presents with concern for code stroke.  Per family, patient had been a little bit quieter for the last 2 days, not quite himself.  Today acutely at 1245 he choked well eating, then developed shaking followed by left facial droop and left-sided weakness.  Family denies recent medication changes, fevers, trauma, cough, chest pain, shortness of breath, abdominal pain, nausea, vomiting, diarrhea, black or bloody stools.  Reports that he can be difficult to understand, however he does attempt to have conversations with them at his baseline.  They are not sure if he is able to express whether he is having pain does have some communication limitations.  They do report that he has had foul-smelling urine for the last few days, and they were going to bring him for urinalysis.  Past Medical History:  Diagnosis Date  . Brain cancer ([place])   . Hiccough   . Hypercholesteremia   . Other elevated white blood cell count   . Other encephalitis and encephalomyelitis   . Posterior reversible encephalopathy syndrome   . Seizure ([place])   . Stroke ([place])   . Testicular hypofunction   . Thyroid disorder     Patient Active Problem List   Diagnosis Date Noted  . CVA (cerebral vascular accident) ([place]) 12/19/2019  . Hallucinations 10/28/2019  . BMI 30.0-30.9,adult 07/04/2019  . GAD (generalized anxiety disorder) 03/28/2019  . Mixed hyperlipidemia  03/28/2019  . History of radiation therapy 03/27/2019  . Testosterone deficiency in male 03/27/2019  . Secondary hypothyroidism 03/27/2019  . 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 Dysphagia   . Traumatic subdural hematoma ([place]) 04/24/2017  . Abnormal movements   . Traumatic subdural hematoma with loss of consciousness of 1 hour to 5 hours 59 minutes ([place]) 04/09/2017  . Other specified strabismus 08/05/2014  . Presbyopia 08/05/2014  . Bradycardia 12/21/2013  . C. difficile colitis 12/21/2013  . Hypotension 12/21/2013  . Prerenal azotemia 12/21/2013  . 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 H/O: stroke 12/08/2013  . Pneumonia 12/08/2013  . Hyponatremia 08/19/2013  . Intracranial meningioma ([place]) 09/30/2002  . Seizure disorder ([place]) 03/18/1991    Past Surgical History:  Procedure Laterality Date  . APPENDECTOMY     1980s  . BRAIN SURGERY     1993  . CRANIOTOMY Right 04/09/2017   Procedure: CRANIOTOMY HEMATOMA EVACUATION SUBDURAL;  Surgeon: Erline Levine, MD;  Location: [facility];  Service: Neurosurgery;  Laterality: Right;  . DIRECT LARYNGOSCOPY N/A 04/09/2017   Procedure: DIRECT LARYNGOSCOPY;  Surgeon: Erline Levine, MD;  Location: [facility];  Service: Neurosurgery;  Laterality: N/A;  . DIRECT LARYNGOSCOPY N/A 04/09/2017   Procedure: DIRECT LARYNGOSCOPY;  Surgeon: [name], MD;  Location: [facility];  Service: ENT;  Laterality: N/A;  . DIRECT LARYNGOSCOPY N/A 04/18/2017   Procedure: DIRECT LARYNGOSCOPY;  Surgeon: [name], MD;  Location: [facility];  Service: ENT;  Laterality: N/A;  . EYE SURGERY     X5  . TRACHEOSTOMY TUBE PLACEMENT  N/A 04/18/2017   Procedure: TRANSNASAL FIBER OPTIC LARYNGOSCOPY;  Surgeon: [name], MD;  [facility];  Service: ENT;  Laterality: N/A;       Family History  Adopted: Yes  Family history unknown: Yes    Social History   Tobacco Use  . Smoking status: Never Smoker  . Smokeless tobacco: Never  Used  Vaping Use  . Vaping Use: Never used  Substance Use Topics  . Alcohol use: No  . Drug use: Never    Home Medications Prior to Admission medications   Medication Sig Start Date End Date Taking? Authorizing Provider  atorvastatin (LIPITOR) 20 MG tablet TAKE 1 TABLET BY MOUTH EVERY DAY Patient taking differently: Take 20 mg by mouth daily.  07/31/19   Lillard Anes, MD  FLUoxetine (PROZAC) 40 MG capsule TAKE 1 CAPSULE BY MOUTH TWICE A DAY Patient taking differently: Take 40 mg by mouth 2 (two) times daily.  10/02/19   Marge Duncans, PA-C  haloperidol (HALDOL) 2 MG tablet TAKE 1 TABLET (2 MG TOTAL) BY MOUTH 2 (TWO) TIMES DAILY. 10/28/19   Lillard Anes, MD  lamoTRIgine (LAMICTAL) 25 MG tablet TAKE 1 TABLET (25 MG TOTAL) BY MOUTH 2 (TWO) TIMES DAILY. TAKE WITH 1/2 TABLET (100MG  TABLET) TO MAKE 75MG  TWICE DAILY. 12/09/19   Suzzanne Cloud, NP  lamoTRIgine 100 MG TBDP Take 50 mg by mouth 2 (two) times daily. Take with 25mg  tablet for a total of 75mg  twice daily. 11/10/19   [provider]  levothyroxine (SYNTHROID) 50 MCG tablet TAKE 1 TABLET BY MOUTH DAILY BEFORE BREAKFAST Patient taking differently: Take 50 mcg by mouth daily before breakfast.  09/27/19   Cox, Kirsten, MD  loratadine (CLARITIN) 10 MG tablet Take 1 tablet (10 mg total) by mouth daily. 11/10/19   Cox, Elnita Maxwell, MD  polyethylene glycol (MIRALAX / Floria Raveling) packet Take 17 g by mouth daily. 05/16/17   Angiulli, Lavon Paganini, PA-C  testosterone cypionate (DEPOTESTOSTERONE CYPIONATE) 200 MG/ML injection Inject 1 mL (200 mg total) into the muscle every 14 (fourteen) days. 09/18/19   CoxElnita Maxwell, MD    Allergies    Heparin, Lidocaine, Quetiapine, Depakote [divalproex sodium], and Phenytoin sodium extended  Review of Systems   Review of Systems  Unable to perform ROS: Mental status change  Constitutional: Negative for fever.  Respiratory: Negative for cough and shortness of breath.   Cardiovascular: Negative for  chest pain.  Gastrointestinal: Negative for abdominal pain, diarrhea, nausea and vomiting.  Neurological: Positive for facial asymmetry, speech difficulty and weakness. Seizures: shaking.    Physical Exam Updated Vital Signs BP 110/63   Pulse 85   Temp 98.6 F (37 C) (Oral)   Resp (!) 22   SpO2 93%   Physical Exam Vitals and nursing note reviewed.  Constitutional:      General: He is not in acute distress.    Appearance: He is well-developed. He is not diaphoretic.  HENT:     Head: Normocephalic and atraumatic.  Eyes:     Conjunctiva/sclera: Conjunctivae normal.  Cardiovascular:     Rate and Rhythm: Normal rate and regular rhythm.     Heart sounds: Normal heart sounds. No murmur heard.  No friction rub. No gallop.   Pulmonary:     Effort: Pulmonary effort is normal. No respiratory distress.     Breath sounds: Normal breath sounds. No wheezing or rales.  Abdominal:     General: There is no distension.     Palpations: Abdomen is  soft.     Tenderness: There is no abdominal tenderness. There is no guarding.  Musculoskeletal:     Cervical back: Normal range of motion.  Skin:    General: Skin is warm and dry.  Neurological:     Mental Status: He is alert and oriented to person, place, and time.     ED Results / Procedures / Treatments   Labs (all labs ordered are listed, but only abnormal results are displayed) Labs Reviewed  COMPREHENSIVE METABOLIC PANEL - Abnormal; Notable for the following components:      Result Value   Potassium 3.3 (*)    Calcium 8.2 (*)    Total Protein 5.4 (*)    Albumin 3.1 (*)    All other components within normal limits  URINALYSIS, ROUTINE W REFLEX MICROSCOPIC - Abnormal; Notable for the following components:   APPearance HAZY (*)    All other components within normal limits  I-STAT CHEM 8, ED - Abnormal; Notable for the following components:   Potassium 3.1 (*)    Calcium, Ion 1.03 (*)    TCO2 21 (*)    All other components within  normal limits  RESP PANEL BY RT-PCR (FLU A&B, COVID) ARPGX2  URINE CULTURE  ETHANOL  PROTIME-INR  APTT  CBC  DIFFERENTIAL  RAPID URINE DRUG SCREEN, HOSP PERFORMED  HIV ANTIBODY (ROUTINE TESTING W REFLEX)  HEMOGLOBIN A1C  LIPID PANEL  BASIC METABOLIC PANEL  CBC WITH DIFFERENTIAL/PLATELET  CBG MONITORING, ED    EKG EKG Interpretation  Date/Time:  Thursday December 19 2019 14:55:30 EST Ventricular Rate:  61 PR Interval:    QRS Duration: 129 QT Interval:  493 QTC Calculation: 497 R Axis:   -21 Text Interpretation: Sinus rhythm Nonspecific intraventricular conduction delay Minimal ST depression, lateral leads ST elevation, consider inferior injury Confirmed by Davonna Belling [phone]) on 12/19/2019 3:27:42 PM   Radiology MR BRAIN W WO CONTRAST  Result Date: 12/19/2019 CLINICAL DATA:  Stroke EXAM: MRI HEAD WITHOUT AND WITH CONTRAST TECHNIQUE: Multiplanar, multiecho pulse sequences of the brain and surrounding structures were obtained without and with intravenous contrast. CONTRAST:  34mL GADAVIST GADOBUTROL 1 MMOL/ML IV SOLN COMPARISON:  None. FINDINGS: Brain: There is an acute infarct of the right half of pons. No mass effect. There are multiple chronic microhemorrhages but no acute hemorrhage or extra-axial collection. There is right parieto-occipital encephalomalacia with diffusely advanced atrophy and ex vacuo dilatation of the ventricles. There is severe cerebellar atrophy. There is a left frontal approach shunt catheter that terminates in the 3rd ventricle. Vascular: Major flow voids are preserved. Skull and upper cervical spine: There are multiple old craniotomies. Sinuses/Orbits:No paranasal sinus fluid levels or advanced mucosal thickening. No mastoid or middle ear effusion. Normal orbits. IMPRESSION: 1. Acute infarct of the right pons. No hemorrhage or mass effect. 2. Severe atrophy. 3. Left frontal approach shunt catheter that terminates in the 3rd ventricle. Electronically  Signed   By: Ulyses Jarred M.D.   On: 12/19/2019 19:38   DG Chest Port 1 View  Result Date: 12/19/2019 CLINICAL DATA:  Evaluate for aspiration. EXAM: PORTABLE CHEST 1 VIEW COMPARISON:  04/19/2017 FINDINGS: The heart size and mediastinal contours are within normal limits. Low lung volumes. No pleural effusion or edema. No airspace densities. The visualized skeletal structures are unremarkable. IMPRESSION: Low lung volumes. No pneumonia. Electronically Signed   By: Kerby Moors M.D.   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 EEG to evaluate for seizure Level of alertness: Awake AEDs during EEG study: LTG Technical aspects: This EEG study was done with scalp electrodes positioned according to the 10-20 International system of electrode placement. Electrical activity was acquired at a sampling rate of 500Hz  and reviewed with a high frequency filter of 70Hz  and a low frequency filter of 1Hz . EEG data were recorded continuously and digitally stored. Description: No posterior dominant rhythm was seen. EEG showed continuous generalized and maximal right posterior quadrant 3 to 6 Hz theta-delta slowing. Hyperventilation and photic stimulation were not performed.   Parts of study were difficult to review due to significant myogenic artifact. ABNORMALITY -Continued slow, generalized and maximal right posterior quadrant IMPRESSION: This technically difficult study is suggestive of cortical dysfunction in right posterior quadrant suggestive of underlying structural  abnormality, post-ictal state. There is also moderate diffuse encephalopathy, nonspecific etiology. No seizures or epileptiform discharges were seen throughout the recording. Lora Havens   CT HEAD CODE STROKE WO CONTRAST  Result Date: 12/19/2019 CLINICAL DATA:  Code stroke. Acute neuro deficit. Left arm weakness. Left facial droop. History of craniotomy for subdural hematoma. History of brain cancer. EXAM: CT HEAD WITHOUT CONTRAST TECHNIQUE: Contiguous axial images were obtained from the base of the skull through the vertex without intravenous contrast. COMPARISON:  CT head 08/23/2019 FINDINGS: Brain: Left frontal ventricular shunt passes through the third ventral into the suprasellar cistern. This is unchanged in position. No hydrocephalus. Ventricle size stable. Small low-density chronic subdural fluid collection on the left is stable. No acute subdural hemorrhage. 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 Encephalomalacia right parietal lobe is unchanged. Extensive atrophy in the cerebellum bilaterally also unchanged. 16 mm dural based mass in the left frontal lobe unchanged from prior studies compatible with meningioma. No brain edema. Calcification in the basal ganglia bilaterally left greater than right stable. Negative for acute infarct.  Negative for acute hemorrhage. Vascular: Negative for hyperdense vessel Skull: Suboccipital craniectomy on the left. Large right hemispheric craniotomy. Sinuses/Orbits: Mild mucosal edema paranasal sinuses. Normal orbit bilaterally. Other: None ASPECTS ([place] Stroke Program Early CT Score) - Ganglionic level infarction (caudate, lentiform nuclei, internal capsule, insula, M1-M3 cortex): 7 - Supraganglionic infarction (M4-M6 cortex): 3 Total score (0-10 with 10 being normal): 10 IMPRESSION: 1. No acute abnormality and no change from the prior CT. 2. ASPECTS is 10 3. Extensive postsurgical changes with right craniotomy and left suboccipital  craniectomy. 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 4. 16 mm left frontal meningioma unchanged. 5. Code stroke imaging results were communicated on 12/19/2019 at 2:51 pm to provider Rory Percy via [place] Electronically Signed   By: Franchot Gallo M.D.   On: 12/19/2019 14:54    Procedures Procedures (including critical care time)  Medications Ordered in ED Medications  atorvastatin (LIPITOR) tablet 20 mg (has no administration in time range)  FLUoxetine (PROZAC) capsule 40 mg (40 mg Oral Not Given 12/19/19 2114)  haloperidol (HALDOL) tablet 2 mg (2 mg Oral Not Given 12/19/19 2114)  levothyroxine (SYNTHROID) tablet 50 mcg (has no administration in time range)  polyethylene glycol (MIRALAX / GLYCOLAX) packet 17 g (17 g Oral Not Given 12/19/19 2026)  loratadine (CLARITIN)  tablet 10 mg (10 mg Oral Not Given 12/19/19 2025)   stroke: mapping our early stages of recovery book (has no administration in time range)  0.9 %  sodium chloride infusion ( Intravenous New Bag/Given 12/19/19 2100)  acetaminophen (TYLENOL) tablet 650 mg ( Oral See Alternative 12/19/19 2103)    Or  acetaminophen (TYLENOL) 160 MG/5ML solution 650 mg ( Per Tube See Alternative 12/19/19 2103)    Or  acetaminophen (TYLENOL) suppository 650 mg (650 mg Rectal Given 12/19/19 2103)  senna-docusate (Senokot-S) tablet 1 tablet (has no administration in time range)  aspirin suppository 300 mg (300 mg Rectal Given 12/19/19 2106)  lamoTRIgine (LAMICTAL) tablet 125 mg (125 mg Oral Not Given 12/19/19 2114)  sodium chloride 0.9 % bolus 1,000 mL (0 mLs Intravenous Stopped 12/19/19 1729)  gadobutrol (GADAVIST) 1 MMOL/ML injection 7 mL (7 mLs Intravenous Contrast Given 12/19/19 1903)  potassium chloride 10 mEq in 100 mL IVPB (10 mEq Intravenous New Bag/Given 12/19/19 2205)    ED Course  I have reviewed the triage vital signs and the nursing notes.  Pertinent labs & imaging results that were available during my care of the patient were reviewed  by me and considered in my medical decision making (see chart for details).    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 Alveda Reasons shunt placement, left parafalcine meningioma, seizures, hx of ICH, presents with concern for code stroke.  Dr. Rory Percy at bedside on arrival. Not a candidate for tPA given multiple bleeds, not intefvention canidate.  Blood pressures high 90s on arrival--no hx to suggest infection, cardiac etiology by history taken from sister. Will give 1L of NS.  Exam concerning for CVA but ddx includes todd's paralysis, other cerebritis.  MR, UA pending at time of transfer of care.    Final Clinical Impression(s) / ED Diagnoses Final diagnoses:  Altered mental status, unspecified altered mental status type  Cerebrovascular accident (CVA), unspecified mechanism ([place])    Rx / DC Orders ED Discharge Orders    None       Gareth Morgan, MD 12/20/19 0028

## 2019-12-19 NOTE — ED Notes (Signed)
Patient remains in MRI 

## 2019-12-19 NOTE — ED Provider Notes (Signed)
  Physical Exam  BP 99/67   Pulse 70   Resp 20   SpO2 92%   Physical Exam  ED Course/Procedures     Procedures  MDM   Received patient signout.  Mental status change.  Has been a little off for last couple days.  History of seizures brain surgeries and previous brain cancer.  Choked and then had left-sided weakness.  Head CT reassuring.  Initial blood pressure was low but has resolved.  Afebrile and 98 rectally.  Seen by neurology.  Will get MRI.  EEG done.  Unsure of read.  MRI to be done since history of autoimmune cerebritis.  However with mental status change required mission to the hospital.  Will discuss with unassigned medicine.      Davonna Belling, MD 12/19/19 [phone]

## 2019-12-19 NOTE — Consult Note (Signed)
Neurology Consultation  Reason for Consult: Code stroke for left-sided weakness Referring Physician: Dr. Gareth Morgan  CC: Left-sided weakness  History is obtained from: Chart, EMS  HPI: Glen [name] is a 49 y.o. 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 in the 90s, followed by radiation, left parafalcine meningioma resection 2004 via craniotomy, history of seizures-complex partial-that started in the 90s, treated with initially [place] but eventually currently only taking Lamictal 100 mg twice a day, seen his outpatient neurologist for hallucinations and right leg weakness in October 2021 with an ensuing MRI brain showing resolved subdurals from 2019 and residual left frontal fluid from prior subdurals, presenting via [facility] for evaluation of sudden onset of left-sided facial droop, left-sided weakness and confusion. Apparently he was in his usual state of health till 12:45 PM when he was having his lunch and choked on some food and all of a sudden became more confused and started exhibiting left-sided weakness symptoms. He is at baseline able to walk sometimes with a walker but he was not able to move his left side at all after the choking episode.  Family was ready to bring him to the hospital but it was very difficult to move him to the car so they called EMS.  EMS was concerned for stroke and activated a code stroke in the field and brought him in. Patient is unable to provide any history.  Family not at bedside at this time  Able to finally reach sister- reports he has been very quiet and not like himself for last 2 days, and today he choked while eating with some whole body jerking following which he had complete left sided weakness. He at baseline speaks very few words and is very difficult to understand at baseline. Had been having strong urine smell in the past few days.  In the field, patient's  systolic blood pressures were in the 80s.  He received some fluid en route.  Saturations on scene were in the 88-89 range which improved with supplemental oxygen to 92.  No witnessed seizure activity either at home or in route to the hospital.   LKW: 1245 hr tpa given?: no, multiple craniotomies, subdural hematoma in the past few years, MRI from October this year with reduced size left frontal fluid collection from the subdural. Premorbid modified Rankin scale (mRS): 4  ROS: Unable to perform due to his mentation  Past Medical History:  Diagnosis Date  . Brain cancer ([place])   . Hiccough   . Hypercholesteremia   . Other elevated white blood cell count   . Other encephalitis and encephalomyelitis   . Posterior reversible encephalopathy syndrome   . Seizure ([place])   . Stroke ([place])   . Testicular hypofunction   . Thyroid disorder    Family History  Adopted: Yes  Family history unknown: Yes   Social History:   reports that he has never smoked. He has never used smokeless tobacco. He reports that he does not drink alcohol and does not use drugs.  Medications No current facility-administered medications for this encounter.  Current Outpatient Medications:  .  atorvastatin (LIPITOR) 20 MG tablet, TAKE 1 TABLET BY MOUTH EVERY DAY, Disp: 90 tablet, Rfl: 2 .  FLUoxetine (PROZAC) 40 MG capsule, TAKE 1 CAPSULE BY MOUTH TWICE A DAY, Disp: 180 capsule, Rfl: 0 .  haloperidol (HALDOL) 2 MG tablet, TAKE 1 TABLET (2 MG TOTAL) BY MOUTH 2 (TWO) TIMES DAILY., Disp:  60 tablet, Rfl: 2 .  lamoTRIgine (LAMICTAL) 25 MG tablet, TAKE 1 TABLET (25 MG TOTAL) BY MOUTH 2 (TWO) TIMES DAILY. TAKE WITH 1/2 TABLET (100MG  TABLET) TO MAKE 75MG  TWICE DAILY., Disp: 60 tablet, Rfl: 1 .  lamoTRIgine 100 MG TBDP, Take 1 tablet by mouth 2 (two) times daily., Disp: , Rfl:  .  levothyroxine (SYNTHROID) 50 MCG tablet, TAKE 1 TABLET BY MOUTH DAILY BEFORE BREAKFAST, Disp: 90 tablet, Rfl: 1 .  loratadine (CLARITIN) 10 MG tablet,  Take 1 tablet (10 mg total) by mouth daily., Disp: 90 tablet, Rfl: 3 .  polyethylene glycol (MIRALAX / GLYCOLAX) packet, Take 17 g by mouth daily., Disp: 14 each, Rfl: 0 .  testosterone cypionate (DEPOTESTOSTERONE CYPIONATE) 200 MG/ML injection, Inject 1 mL (200 mg total) into the muscle every 14 (fourteen) days., Disp: 10 mL, Rfl: 0   Exam: Current vital signs: BP 97/64   Pulse 65   Resp 19  Vital signs in last 24 hours: Pulse Rate:  [65] 65 (12/02 1500) Resp:  [19] 19 (12/02 1500) BP: (97)/(64) 97/64 (12/02 1500) General: Patient is awake, alert in no distress HEENT: Multiple craniotomy scars CVS: Regular rhythm Respiratory: Diminished sounds all over Abdomen nondistended nontender Neurological exam He is awake alert He is completely mute Does not follow any commands Cranial nerves: His gaze is midline, he is able to look to the left but has inability to look to the right-left gaze preference, does not blink to threat consistently from either side, has an obvious left lower facial weakness when he grimaces in response to noxious stimulation. Motor exam: Right upper extremity is antigravity without drift.  Right lower extremity can go antigravity but drifts to bed before 5 seconds.  Left upper and lower extremity are at best 1 out of 5. Sensory exam: Grimaces to noxious stimulation in all 4 extremities Coordination difficult to assess due to his mentation and inability to follow commands NIH stroke scale-23  Labs I have reviewed labs in epic and the results pertinent to this consultation are: CBC    Component Value Date/Time   WBC 8.5 12/19/2019 1432   RBC 4.58 12/19/2019 1432   HGB 13.3 12/19/2019 1445   HGB 15.4 10/07/2019 1211   HCT 39.0 12/19/2019 1445   HCT 46.3 10/07/2019 1211   PLT 198 12/19/2019 1432   PLT 253 10/07/2019 1211   MCV 89.3 12/19/2019 1432   MCV 89 10/07/2019 1211   MCH 29.0 12/19/2019 1432   MCHC 32.5 12/19/2019 1432   RDW 12.5 12/19/2019 1432    RDW 13.9 10/07/2019 1211   LYMPHSABS 2.0 12/19/2019 1432   LYMPHSABS 1.8 10/07/2019 1211   MONOABS 0.6 12/19/2019 1432   EOSABS 0.2 12/19/2019 1432   EOSABS 0.3 10/07/2019 1211   BASOSABS 0.0 12/19/2019 1432   BASOSABS 0.1 10/07/2019 1211   CMP     Component Value Date/Time   NA 141 12/19/2019 1445   NA 139 10/07/2019 1211   K 3.1 (L) 12/19/2019 1445   CL 106 12/19/2019 1445   CO2 25 10/07/2019 1211   GLUCOSE 86 12/19/2019 1445   BUN 8 12/19/2019 1445   BUN 9 10/07/2019 1211   CREATININE 1.10 12/19/2019 1445   CALCIUM 9.0 10/07/2019 1211   PROT 6.9 10/07/2019 1211   ALBUMIN 4.6 10/07/2019 1211   AST 26 10/07/2019 1211   ALT 34 10/07/2019 1211   ALKPHOS 96 10/07/2019 1211   BILITOT 0.5 10/07/2019 1211   GFRNONAA 74 10/07/2019 1211  GFRAA 85 10/07/2019 1211   Imaging I have reviewed the images obtained:  CT-scan of the brain-no acute changes  MRI examination of the brain-pending  Assessment:  49 year old with above past medical history presenting for sudden onset of left-sided weakness. Has had a gradual decline in his functional status over the past few years. 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 On my examination, it does appear that he has had a stroke affecting the left side of his body-but he is not a candidate for IV TPA due to multiple reasons-multiple intracranial bleeds, multiple craniotomies. He is not a candidate for EVT due to poor baseline modified Rankin.  Recommendations: MRI brain with without contrast-if any suspicion for abnormal enhancement, will need LP to rule out autoimmune cerebritis. If stroke is found, needs full stroke work-up. We will follow.  -- Amie Portland, MD [facility] Pager: [phone] If 7pm to 7am, please call on call as listed on AMION.   Addendum MRI with right pontine stroke Needs full stroke  work-up -Admit to hospitalist or observation -Telemetry monitoring -Allow for permissive hypertension for the first 24-48h - only treat PRN if SBP >220 mmHg. Blood pressures can be gradually normalized to SBP<140 upon discharge. -CT Angiogram of Head and neck -Echocardiogram -HgbA1c, fasting lipid panel -Frequent neuro checks -Prophylactic therapy-Antiplatelet med: Aspirin - dose 325mg  PO or 300mg  PR -Atorvastatin 80 mg PO daily -Risk factor modification -PT consult, OT consult, Speech consult -Continue lamotrigine at home dose.   Please page stroke NP/PA/MD (listed on AMION)  from 8am-4 pm as this patient will be followed by the stroke team at this point.  -- Amie Portland, MD [facility] Pager: [phone] If 7pm to 7am, please call on call as listed on AMION.

## 2019-12-19 NOTE — Procedures (Signed)
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 EEG to evaluate for seizure  Level of alertness: Awake  AEDs during EEG study: LTG  Technical aspects: This EEG study was done with scalp electrodes positioned according to the 10-20 International system of electrode placement. Electrical activity was acquired at a sampling rate of 500Hz  and reviewed with a high frequency filter of 70Hz  and a low frequency filter of 1Hz . EEG data were recorded continuously and digitally stored.   Description: No posterior dominant rhythm was seen. EEG showed continuous generalized and maximal right posterior quadrant 3 to 6 Hz theta-delta slowing. Hyperventilation and photic stimulation were not performed.    Parts of study were difficult to review due to significant myogenic artifact.  ABNORMALITY -Continued slow, generalized and maximal right posterior quadrant  IMPRESSION: This technically difficult study is suggestive of cortical dysfunction in right posterior quadrant suggestive of underlying structural abnormality, post-ictal state. There is also moderate diffuse encephalopathy, nonspecific etiology. No seizures or epileptiform discharges were seen throughout the recording.  Shelma Eiben Barbra Sarks

## 2019-12-20 ENCOUNTER — Other Ambulatory Visit: Payer: Self-pay | Admitting: Family Medicine

## 2019-12-20 ENCOUNTER — Inpatient Hospital Stay (HOSPITAL_COMMUNITY): Payer: Medicare HMO

## 2019-12-20 ENCOUNTER — Other Ambulatory Visit (HOSPITAL_COMMUNITY): Payer: Medicare HMO

## 2019-12-20 DIAGNOSIS — I6302 Cerebral infarction due to thrombosis of basilar artery: Secondary | ICD-10-CM | POA: Diagnosis not present

## 2019-12-20 DIAGNOSIS — G40909 Epilepsy, unspecified, not intractable, without status epilepticus: Secondary | ICD-10-CM | POA: Diagnosis not present

## 2019-12-20 DIAGNOSIS — I639 Cerebral infarction, unspecified: Secondary | ICD-10-CM | POA: Diagnosis not present

## 2019-12-20 LAB — BASIC METABOLIC PANEL
Anion gap: 11 (ref 5–15)
BUN: 10 mg/dL (ref 6–20)
CO2: 23 mmol/L (ref 22–32)
Calcium: 9 mg/dL (ref 8.9–10.3)
Chloride: 101 mmol/L (ref 98–111)
Creatinine, Ser: 0.99 mg/dL (ref 0.61–1.24)
GFR, Estimated: >60 mL/min (ref >60)
Glucose, Bld: 87 mg/dL (ref 70–99)
Potassium: 4.3 mmol/L (ref 3.5–5.1)
Sodium: 135 mmol/L (ref 135–145)

## 2019-12-20 LAB — CBC WITH DIFFERENTIAL/PLATELET
Abs Immature Granulocytes: 0.08 10*3/uL — ABNORMAL HIGH (ref 0.00–0.07)
Basophils Absolute: 0 10*3/uL (ref 0.0–0.1)
Basophils Relative: 0 %
Eosinophils Absolute: 0.1 10*3/uL (ref 0.0–0.5)
Eosinophils Relative: 1 %
HCT: 43.8 % (ref 39.0–52.0)
Hemoglobin: 14.2 g/dL (ref 13.0–17.0)
Immature Granulocytes: 1 %
Lymphocytes Relative: 10 %
Lymphs Abs: 1.5 10*3/uL (ref 0.7–4.0)
MCH: 29 pg (ref 26.0–34.0)
MCHC: 32.4 g/dL (ref 30.0–36.0)
MCV: 89.6 fL (ref 80.0–100.0)
Monocytes Absolute: 1.1 10*3/uL — ABNORMAL HIGH (ref 0.1–1.0)
Monocytes Relative: 8 %
Neutro Abs: 12.5 10*3/uL — ABNORMAL HIGH (ref 1.7–7.7)
Neutrophils Relative %: 80 %
Platelets: 203 10*3/uL (ref 150–400)
RBC: 4.89 MIL/uL (ref 4.22–5.81)
RDW: 12.6 % (ref 11.5–15.5)
WBC: 15.3 10*3/uL — ABNORMAL HIGH (ref 4.0–10.5)
nRBC: 0 % (ref 0.0–0.2)

## 2019-12-20 LAB — LIPID PANEL
Cholesterol: 112 mg/dL (ref 0–200)
HDL: 37 mg/dL — ABNORMAL LOW (ref >40)
LDL Cholesterol: 62 mg/dL (ref 0–99)
Total CHOL/HDL Ratio: 3 RATIO
Triglycerides: 67 mg/dL (ref <150)
VLDL: 13 mg/dL (ref 0–40)

## 2019-12-20 LAB — URINE CULTURE: Culture: <10000 — AB

## 2019-12-20 LAB — HEMOGLOBIN A1C
Hgb A1c MFr Bld: 5.4 % (ref 4.8–5.6)
Mean Plasma Glucose: 108.28 mg/dL

## 2019-12-20 LAB — HIV ANTIBODY (ROUTINE TESTING W REFLEX): HIV Screen 4th Generation wRfx: NONREACTIVE

## 2019-12-20 MED ORDER — RESOURCE THICKENUP CLEAR PO POWD
ORAL | Status: DC | PRN
  Filled 2019-12-20: qty 125

## 2019-12-20 MED ORDER — IOHEXOL 350 MG/ML SOLN
75.0000 mL | Freq: Once | INTRAVENOUS | Status: AC | PRN
  Administered 2019-12-20: 75 mL via INTRAVENOUS

## 2019-12-20 MED ORDER — FONDAPARINUX SODIUM 2.5 MG/0.5ML ~~LOC~~ SOLN
2.5000 mg | Freq: Every day | SUBCUTANEOUS | Status: DC
  Administered 2019-12-20 – 2019-12-22 (×3): 2.5 mg via SUBCUTANEOUS
  Filled 2019-12-20 (×3): qty 0.5

## 2019-12-20 NOTE — ED Notes (Signed)
Attempted x 1  

## 2019-12-20 NOTE — Progress Notes (Signed)
PROGRESS NOTE    Glen [name]   QJJ:[id number]  DOB: [birth year]  DOA: 12/19/2019     1  PCP: Rochel Brome, MD  CC: weakness, AMS  Hospital Course: Glen [name] is a 49 y.o. male with medical history significant of fourth ventricle medulla blastoma, status post surgery in 1995, left sided meningioma status post resection in 2004, seizure disorder, chronic ambulatory dysfunction on roller walker, hypothyroidism who presented with new onset of left-sided weakness, difficulty swallowing and altered mentation.  He was unable to provide any meaningful information on admission.  It was reported that patient's mom had noticed the patient was becoming more tremulous and decreased movement in his hands with associated dropping utensils when eating.  After an acute episode of choking on food at home and becoming unresponsive, he was brought to the ER for further evaluation. There was concern for possible stroke and patient underwent work-up.  MRI brain showed an acute infarct involving the right pons.  There was also underlying severe atrophy.    Interval History:  Patient seen sitting up in chair in his room this morning.  He was unable to follow any commands or seemingly understand me.  Old records reviewed in assessment of this patient  ROS: Review of systems not obtained due to patient factors. 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  Patient had left-sided weakness, dysarthria, aphasia -Continue aspirin 325 mg daily -Follow-up neurology consult -Keep n.p.o.; failed swallow eval with SLP.  Plans are for MBS -Prognosis guarded given pons stroke with underlying significant history of brain pathology with severe atrophy -Low threshold to involve palliative care if no improvement or decline  Seizure disorder -Continue Lamictal  Hypothyroidism -Continue Synthroid  Hypokalemia -Replete and recheck as needed  History of brain  tumor, involving meningioma and medulloblastoma -No acute issue  Hx of cerebritis -Etiology with one of the differentials neurology concern about.  But given the acute MRI findings of stroke, cerebritis is unlikely. -Patient has been following with outpatient neurologist, MRI done on October 25 showed no acute finding.  Antimicrobials: None  DVT prophylaxis: Arixtra, history of HIT Code Status: Full Family Communication: None present Disposition Plan: Status is: Inpatient  Remains inpatient appropriate because:Altered mental status, IV treatments appropriate due to intensity of illness or inability to take PO and Inpatient level of care appropriate due to severity of illness   Dispo: The patient is from: Home              Anticipated d/c is to: SNF              Anticipated d/c date is: > 3 days              Patient currently is not medically stable to d/c.  Objective: Blood pressure 104/66, pulse 79, temperature 98 F (36.7 C), temperature source Oral, resp. rate (!) 21, height 6\' 2"  (1.88 m), weight 86 kg, SpO2 95 %.  Examination: General appearance: Chronically ill-appearing adult man appearing much older than actual age lying in bed in no distress and unable to interact or follow commands Head: Chronic skull deformities noted Eyes: EOMI Lungs: clear to auscultation bilaterally Heart: regular rate and rhythm and S1, S2 normal Abdomen: normal findings: bowel sounds normal and soft, non-tender Extremities: No edema Skin: warm, dry, intact Neurologic: decreased strength throughout, difficult to assess given cognitive impairment. Does not follow commands well  Consultants:   Neurology  Procedures:  Data Reviewed: I have personally reviewed following labs and imaging studies Results for orders placed or performed during the hospital encounter of 12/19/19 (from the past 24 hour(s))  Protime-INR     Status: None   Collection Time: 12/19/19  2:32 PM  Result Value Ref  Range   Prothrombin Time 13.9 11.4 - 15.2 seconds   INR 1.1 0.8 - 1.2  APTT     Status: None   Collection Time: 12/19/19  2:32 PM  Result Value Ref Range   aPTT 31 24 - 36 seconds  CBC     Status: None   Collection Time: 12/19/19  2:32 PM  Result Value Ref Range   WBC 8.5 4.0 - 10.5 K/uL   RBC 4.58 4.22 - 5.81 MIL/uL   Hemoglobin 13.3 13.0 - 17.0 g/dL   HCT 40.9 39 - 52 %   MCV 89.3 80.0 - 100.0 fL   MCH 29.0 26.0 - 34.0 pg   MCHC 32.5 30.0 - 36.0 g/dL   RDW 12.5 11.5 - 15.5 %   Platelets 198 150 - 400 K/uL   nRBC 0.0 0.0 - 0.2 %  Differential     Status: None   Collection Time: 12/19/19  2:32 PM  Result Value Ref Range   Neutrophils Relative % 65 %   Neutro Abs 5.6 1.7 - 7.7 K/uL   Lymphocytes Relative 24 %   Lymphs Abs 2.0 0.7 - 4.0 K/uL   Monocytes Relative 7 %   Monocytes Absolute 0.6 0.1 - 1.0 K/uL   Eosinophils Relative 2 %   Eosinophils Absolute 0.2 0.0 - 0.5 K/uL   Basophils Relative 1 %   Basophils Absolute 0.0 0.0 - 0.1 K/uL   Immature Granulocytes 1 %   Abs Immature Granulocytes 0.04 0.00 - 0.07 K/uL  Comprehensive metabolic panel     Status: Abnormal   Collection Time: 12/19/19  2:32 PM  Result Value Ref Range   Sodium 140 135 - 145 mmol/L   Potassium 3.3 (L) 3.5 - 5.1 mmol/L   Chloride 107 98 - 111 mmol/L   CO2 22 22 - 32 mmol/L   Glucose, Bld 91 70 - 99 mg/dL   BUN 8 6 - 20 mg/dL   Creatinine, Ser 1.21 0.61 - 1.24 mg/dL   Calcium 8.2 (L) 8.9 - 10.3 mg/dL   Total Protein 5.4 (L) 6.5 - 8.1 g/dL   Albumin 3.1 (L) 3.5 - 5.0 g/dL   AST 27 15 - 41 U/L   ALT 37 0 - 44 U/L   Alkaline Phosphatase 64 38 - 126 U/L   Total Bilirubin 0.6 0.3 - 1.2 mg/dL   GFR, Estimated >60 >60 mL/min   Anion gap 11 5 - 15  CBG monitoring, ED     Status: None   Collection Time: 12/19/19  2:32 PM  Result Value Ref Range   Glucose-Capillary 96 70 - 99 mg/dL  I-stat chem 8, ED     Status: Abnormal   Collection Time: 12/19/19  2:45 PM  Result Value Ref Range   Sodium 141 135  - 145 mmol/L   Potassium 3.1 (L) 3.5 - 5.1 mmol/L   Chloride 106 98 - 111 mmol/L   BUN 8 6 - 20 mg/dL   Creatinine, Ser 1.10 0.61 - 1.24 mg/dL   Glucose, Bld 86 70 - 99 mg/dL   Calcium, Ion 1.03 (L) 1.15 - 1.40 mmol/L   TCO2 21 (L) 22 - 32 mmol/L   Hemoglobin 13.3 13.0 -  17.0 g/dL   HCT 39.0 39 - 52 %  Ethanol     Status: None   Collection Time: 12/19/19  4:31 PM  Result Value Ref Range   Alcohol, Ethyl (B) <10 <10 mg/dL  Urine rapid drug screen (hosp performed)     Status: None   Collection Time: 12/19/19  4:31 PM  Result Value Ref Range   Opiates NONE DETECTED NONE DETECTED   Cocaine NONE DETECTED NONE DETECTED   Benzodiazepines NONE DETECTED NONE DETECTED   Amphetamines NONE DETECTED NONE DETECTED   Tetrahydrocannabinol NONE DETECTED NONE DETECTED   Barbiturates NONE DETECTED NONE DETECTED  Urinalysis, Routine w reflex microscopic Urine, Clean Catch     Status: Abnormal   Collection Time: 12/19/19  4:31 PM  Result Value Ref Range   Color, Urine YELLOW YELLOW   APPearance HAZY (A) CLEAR   Specific Gravity, Urine 1.019 1.005 - 1.030   pH 5.0 5.0 - 8.0   Glucose, UA NEGATIVE NEGATIVE mg/dL   Hgb urine dipstick NEGATIVE NEGATIVE   Bilirubin Urine NEGATIVE NEGATIVE   Ketones, ur NEGATIVE NEGATIVE mg/dL   Protein, ur NEGATIVE NEGATIVE mg/dL   Nitrite NEGATIVE NEGATIVE   Leukocytes,Ua NEGATIVE NEGATIVE  Urine culture     Status: Abnormal   Collection Time: 12/19/19  4:31 PM   Specimen: Urine, Random  Result Value Ref Range   Specimen Description URINE, RANDOM    Special Requests NONE    Culture (A)     <10,000 COLONIES/mL INSIGNIFICANT GROWTH Performed at [facility], [address] [place], [place] [address]    Report Status 12/20/2019 FINAL   Resp Panel by RT-PCR (Flu A&B, Covid) Nasopharyngeal Swab     Status: None   Collection Time: 12/19/19  4:31 PM   Specimen: Nasopharyngeal Swab; Nasopharyngeal(NP) swabs in vial transport medium  Result Value Ref Range    SARS Coronavirus 2 by RT PCR NEGATIVE NEGATIVE   Influenza A by PCR NEGATIVE NEGATIVE   Influenza B by PCR NEGATIVE NEGATIVE  HIV Antibody (routine testing w rflx)     Status: None   Collection Time: 12/20/19  2:50 AM  Result Value Ref Range   HIV Screen 4th Generation wRfx Non Reactive Non Reactive  Hemoglobin A1c     Status: None   Collection Time: 12/20/19  2:50 AM  Result Value Ref Range   Hgb A1c MFr Bld 5.4 4.8 - 5.6 %   Mean Plasma Glucose 108.28 mg/dL  Lipid panel     Status: Abnormal   Collection Time: 12/20/19  2:50 AM  Result Value Ref Range   Cholesterol 112 0 - 200 mg/dL   Triglycerides 67 <150 mg/dL   HDL 37 (L) >40 mg/dL   Total CHOL/HDL Ratio 3.0 RATIO   VLDL 13 0 - 40 mg/dL   LDL Cholesterol 62 0 - 99 mg/dL  Basic metabolic panel     Status: None   Collection Time: 12/20/19  2:50 AM  Result Value Ref Range   Sodium 135 135 - 145 mmol/L   Potassium 4.3 3.5 - 5.1 mmol/L   Chloride 101 98 - 111 mmol/L   CO2 23 22 - 32 mmol/L   Glucose, Bld 87 70 - 99 mg/dL   BUN 10 6 - 20 mg/dL   Creatinine, Ser 0.99 0.61 - 1.24 mg/dL   Calcium 9.0 8.9 - 10.3 mg/dL   GFR, Estimated >60 >60 mL/min   Anion gap 11 5 - 15  CBC with Differential/Platelet  Status: Abnormal   Collection Time: 12/20/19  2:50 AM  Result Value Ref Range   WBC 15.3 (H) 4.0 - 10.5 K/uL   RBC 4.89 4.22 - 5.81 MIL/uL   Hemoglobin 14.2 13.0 - 17.0 g/dL   HCT 43.8 39 - 52 %   MCV 89.6 80.0 - 100.0 fL   MCH 29.0 26.0 - 34.0 pg   MCHC 32.4 30.0 - 36.0 g/dL   RDW 12.6 11.5 - 15.5 %   Platelets 203 150 - 400 K/uL   nRBC 0.0 0.0 - 0.2 %   Neutrophils Relative % 80 %   Neutro Abs 12.5 (H) 1.7 - 7.7 K/uL   Lymphocytes Relative 10 %   Lymphs Abs 1.5 0.7 - 4.0 K/uL   Monocytes Relative 8 %   Monocytes Absolute 1.1 (H) 0.1 - 1.0 K/uL   Eosinophils Relative 1 %   Eosinophils Absolute 0.1 0.0 - 0.5 K/uL   Basophils Relative 0 %   Basophils Absolute 0.0 0.0 - 0.1 K/uL   Immature Granulocytes 1 %   Abs  Immature Granulocytes 0.08 (H) 0.00 - 0.07 K/uL    Recent Results (from the past 240 hour(s))  Urine culture     Status: Abnormal   Collection Time: 12/19/19  4:31 PM   Specimen: Urine, Random  Result Value Ref Range Status   Specimen Description URINE, RANDOM  Final   Special Requests NONE  Final   Culture (A)  Final    <10,000 COLONIES/mL INSIGNIFICANT GROWTH Performed at [facility], [address], [place], [place] [address]    Report Status 12/20/2019 FINAL  Final  Resp Panel by RT-PCR (Flu A&B, Covid) Nasopharyngeal Swab     Status: None   Collection Time: 12/19/19  4:31 PM   Specimen: Nasopharyngeal Swab; Nasopharyngeal(NP) swabs in vial transport medium  Result Value Ref Range Status   SARS Coronavirus 2 by RT PCR NEGATIVE NEGATIVE Final    Comment: (NOTE) SARS-CoV-2 target nucleic acids are NOT DETECTED.  The SARS-CoV-2 RNA is generally detectable in upper respiratory specimens during the acute phase of infection. The lowest concentration of SARS-CoV-2 viral copies this assay can detect is 138 copies/mL. A negative result does not preclude SARS-Cov-2 infection and should not be used as the sole basis for treatment or other patient management decisions. A negative result may occur with  improper specimen collection/handling, submission of specimen other than nasopharyngeal swab, presence of viral mutation(s) within the areas targeted by this assay, and inadequate number of viral copies(<138 copies/mL). A negative result must be combined with clinical observations, patient history, and epidemiological information. The expected result is Negative.  Fact Sheet for Patients:  EntrepreneurPulse.com.au  Fact Sheet for Healthcare Providers:  IncredibleEmployment.be  This test is no t yet approved or cleared by the [place] FDA and  has been authorized for detection and/or diagnosis of SARS-CoV-2 by FDA under an Emergency Use  Authorization (EUA). This EUA will remain  in effect (meaning this test can be used) for the duration of the COVID-19 declaration under Section 564(b)(1) of the Act, 21 U.S.C.section 360bbb-3(b)(1), unless the authorization is terminated  or revoked sooner.       Influenza A by PCR NEGATIVE NEGATIVE Final   Influenza B by PCR NEGATIVE NEGATIVE Final    Comment: (NOTE) The Xpert Xpress SARS-CoV-2/FLU/RSV plus assay is intended as an aid in the diagnosis of influenza from Nasopharyngeal swab specimens and should not be used as a sole basis for treatment. Nasal  washings and aspirates are unacceptable for Xpert Xpress SARS-CoV-2/FLU/RSV testing.  Fact Sheet for Patients: EntrepreneurPulse.com.au  Fact Sheet for Healthcare Providers: IncredibleEmployment.be  This test is not yet approved or cleared by the [place] FDA and has been authorized for detection and/or diagnosis of SARS-CoV-2 by FDA under an Emergency Use Authorization (EUA). This EUA will remain in effect (meaning this test can be used) for the duration of the COVID-19 declaration under Section 564(b)(1) of the Act, 21 U.S.C. section 360bbb-3(b)(1), unless the authorization is terminated or revoked.  Performed at [facility], [place] [address], [place], [place] [address]      Radiology Studies: CT ANGIO HEAD W OR WO CONTRAST  Result Date: 12/20/2019 CLINICAL DATA:  Pontine infarct EXAM: CT ANGIOGRAPHY HEAD AND NECK TECHNIQUE: Multidetector CT imaging of the head and neck was performed using the standard protocol during bolus administration of intravenous contrast. Multiplanar CT image reconstructions and MIPs were obtained to evaluate the vascular anatomy. Carotid stenosis measurements (when applicable) are obtained utilizing NASCET criteria, using the distal internal carotid diameter as the denominator. CONTRAST:  44mL OMNIPAQUE IOHEXOL 350 MG/ML SOLN COMPARISON:  Brain MRI  12/19/2019 FINDINGS: CTA NECK FINDINGS SKELETON: There is no bony spinal canal stenosis. No lytic or blastic lesion. OTHER NECK: Normal pharynx, larynx and major salivary glands. No cervical lymphadenopathy. Unremarkable thyroid gland. UPPER CHEST: No pneumothorax or pleural effusion. No nodules or masses. AORTIC ARCH: There is no calcific atherosclerosis of the aortic arch. There is no aneurysm, dissection or hemodynamically significant stenosis of the visualized portion of the aorta. Conventional 3 vessel aortic branching pattern. The visualized proximal subclavian arteries are widely patent. RIGHT CAROTID SYSTEM: Normal without aneurysm, dissection or stenosis. LEFT CAROTID SYSTEM: Normal without aneurysm, dissection or stenosis. VERTEBRAL ARTERIES: Left dominant configuration. Both origins are clearly patent. There is no dissection, occlusion or flow-limiting stenosis to the skull base (V1-V3 segments). CTA HEAD FINDINGS There is a meningioma of the anterior left convexity that measures 1.3 x 1.0 cm. Second meningioma adjacent to the left cavernous sinus versus venous contrast blush. POSTERIOR CIRCULATION: --Vertebral arteries: Normal V4 segments. --Inferior cerebellar arteries: Normal. --Basilar artery: Normal. --Superior cerebellar arteries: Normal. --Posterior cerebral arteries (PCA): Normal. ANTERIOR CIRCULATION: --Intracranial internal carotid arteries: Normal. --Anterior cerebral arteries (ACA): Normal. Both A1 segments are present. Patent anterior communicating artery (a-comm). --Middle cerebral arteries (MCA): Normal. VENOUS SINUSES: As permitted by contrast timing, patent. ANATOMIC VARIANTS: None Review of the MIP images confirms the above findings. IMPRESSION: 1. No intracranial arterial occlusion or high-grade stenosis. 2. A 1.3 x 1.0 cm meningioma of the anterior left convexity. Suspect second meningioma adjacent to the left cavernous sinus. 3. Normal CTA of the neck. Electronically Signed   By:  Ulyses Jarred M.D.   On: 12/20/2019 01:53   CT ANGIO NECK W OR WO CONTRAST  Result Date: 12/20/2019 CLINICAL DATA:  [place]ine infarct EXAM: CT ANGIOGRAPHY HEAD AND NECK TECHNIQUE: Multidetector CT imaging of the head and neck was performed using the standard protocol during bolus administration of intravenous contrast. Multiplanar CT image reconstructions and MIPs were obtained to evaluate the vascular anatomy. Carotid stenosis measurements (when applicable) are obtained utilizing NASCET criteria, using the distal internal carotid diameter as the denominator. CONTRAST:  39mL OMNIPAQUE IOHEXOL 350 MG/ML SOLN COMPARISON:  Brain MRI 12/19/2019 FINDINGS: CTA NECK FINDINGS SKELETON: There is no bony spinal canal stenosis. No lytic or blastic lesion. OTHER NECK: Normal pharynx, larynx and major salivary glands. No cervical lymphadenopathy. Unremarkable thyroid gland.  UPPER CHEST: No pneumothorax or pleural effusion. No nodules or masses. AORTIC ARCH: There is no calcific atherosclerosis of the aortic arch. There is no aneurysm, dissection or hemodynamically significant stenosis of the visualized portion of the aorta. Conventional 3 vessel aortic branching pattern. The visualized proximal subclavian arteries are widely patent. RIGHT CAROTID SYSTEM: Normal without aneurysm, dissection or stenosis. LEFT CAROTID SYSTEM: Normal without aneurysm, dissection or stenosis. VERTEBRAL ARTERIES: Left dominant configuration. Both origins are clearly patent. There is no dissection, occlusion or flow-limiting stenosis to the skull base (V1-V3 segments). CTA HEAD FINDINGS There is a meningioma of the anterior left convexity that measures 1.3 x 1.0 cm. Second meningioma adjacent to the left cavernous sinus versus venous contrast blush. POSTERIOR CIRCULATION: --Vertebral arteries: Normal V4 segments. --Inferior cerebellar arteries: Normal. --Basilar artery: Normal. --Superior cerebellar arteries: Normal. --Posterior cerebral arteries  (PCA): Normal. ANTERIOR CIRCULATION: --Intracranial internal carotid arteries: Normal. --Anterior cerebral arteries (ACA): Normal. Both A1 segments are present. Patent anterior communicating artery (a-comm). --Middle cerebral arteries (MCA): Normal. VENOUS SINUSES: As permitted by contrast timing, patent. ANATOMIC VARIANTS: None Review of the MIP images confirms the above findings. IMPRESSION: 1. No intracranial arterial occlusion or high-grade stenosis. 2. A 1.3 x 1.0 cm meningioma of the anterior left convexity. Suspect second meningioma adjacent to the left cavernous sinus. 3. Normal CTA of the neck. Electronically Signed   By: Ulyses Jarred M.D.   On: 12/20/2019 01:53   MR BRAIN W WO CONTRAST  Result Date: 12/19/2019 CLINICAL DATA:  Stroke EXAM: MRI HEAD WITHOUT AND WITH CONTRAST TECHNIQUE: Multiplanar, multiecho pulse sequences of the brain and surrounding structures were obtained without and with intravenous contrast. CONTRAST:  82mL GADAVIST GADOBUTROL 1 MMOL/ML IV SOLN COMPARISON:  None. FINDINGS: Brain: There is an acute infarct of the right half of pons. No mass effect. There are multiple chronic microhemorrhages but no acute hemorrhage or extra-axial collection. There is right parieto-occipital encephalomalacia with diffusely advanced atrophy and ex vacuo dilatation of the ventricles. There is severe cerebellar atrophy. There is a left frontal approach shunt catheter that terminates in the 3rd ventricle. Vascular: Major flow voids are preserved. Skull and upper cervical spine: There are multiple old craniotomies. Sinuses/Orbits:No paranasal sinus fluid levels or advanced mucosal thickening. No mastoid or middle ear effusion. Normal orbits. IMPRESSION: 1. Acute infarct of the right pons. No hemorrhage or mass effect. 2. Severe atrophy. 3. Left frontal approach shunt catheter that terminates in the 3rd ventricle. Electronically Signed   By: Ulyses Jarred M.D.   On: 12/19/2019 19:38   DG Chest Port 1  View  Result Date: 12/19/2019 CLINICAL DATA:  Evaluate for aspiration. EXAM: PORTABLE CHEST 1 VIEW COMPARISON:  04/19/2017 FINDINGS: The heart size and mediastinal contours are within normal limits. Low lung volumes. No pleural effusion or edema. No airspace densities. The visualized skeletal structures are unremarkable. IMPRESSION: Low lung volumes. No pneumonia. Electronically Signed   By: Kerby Moors M.D.   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 EEG to evaluate for seizure Level of alertness: Awake AEDs during EEG study: LTG Technical aspects: This EEG study was done with  scalp electrodes positioned according to the 10-20 International system of electrode placement. Electrical activity was acquired at a sampling rate of 500Hz  and reviewed with a high frequency filter of 70Hz  and a low frequency filter of 1Hz . EEG data were recorded continuously and digitally stored. Description: No posterior dominant rhythm was seen. EEG showed continuous generalized and maximal right posterior quadrant 3 to 6 Hz theta-delta slowing. Hyperventilation and photic stimulation were not performed.   Parts of study were difficult to review due to significant myogenic artifact. ABNORMALITY -Continued slow, generalized and maximal right posterior quadrant IMPRESSION: This technically difficult study is suggestive of cortical dysfunction in right posterior quadrant suggestive of underlying structural abnormality, post-ictal state. There is also moderate diffuse encephalopathy,  nonspecific etiology. No seizures or epileptiform discharges were seen throughout the recording. Lora Havens   CT HEAD CODE STROKE WO CONTRAST  Result Date: 12/19/2019 CLINICAL DATA:  Code stroke. Acute neuro deficit. Left arm weakness. Left facial droop. History of craniotomy for subdural hematoma. History of brain cancer. EXAM: CT HEAD WITHOUT CONTRAST TECHNIQUE: Contiguous axial images were obtained from the base of the skull through the vertex without intravenous contrast. COMPARISON:  CT head 08/23/2019 FINDINGS: Brain: Left frontal ventricular shunt passes through the third ventral into the suprasellar cistern. This is unchanged in position. No hydrocephalus. Ventricle size stable. Small low-density chronic subdural fluid collection on the left is stable. No acute subdural hemorrhage. 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 Encephalomalacia right parietal lobe is unchanged. Extensive atrophy in the cerebellum bilaterally also unchanged. 16 mm dural based mass in the left frontal lobe unchanged from prior studies compatible with meningioma. No brain edema. Calcification in the basal ganglia bilaterally left greater than right stable. Negative for acute infarct.  Negative for acute hemorrhage. Vascular: Negative for hyperdense vessel Skull: Suboccipital craniectomy on the left. Large right hemispheric craniotomy. Sinuses/Orbits: Mild mucosal edema paranasal sinuses. Normal orbit bilaterally. Other: None ASPECTS ([place] Stroke Program Early CT Score) - Ganglionic level infarction (caudate, lentiform nuclei, internal capsule, insula, M1-M3 cortex): 7 - Supraganglionic infarction (M4-M6 cortex): 3 Total score (0-10 with 10 being normal): 10 IMPRESSION: 1. No acute abnormality and no change from the prior CT. 2. ASPECTS is 10 3. Extensive postsurgical changes with right craniotomy and left suboccipital craniectomy. 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 4. 16 mm left  frontal meningioma unchanged. 5. Code stroke imaging results were communicated on 12/19/2019 at 2:51 pm to provider Rory Percy via [place] Electronically Signed   By: Franchot Gallo M.D.   On: 12/19/2019 14:54   CT ANGIO NECK W OR WO CONTRAST  Final Result    CT ANGIO HEAD W OR WO CONTRAST  Final Result    DG Chest Port 1 View  Final Result    MR BRAIN W WO CONTRAST  Final Result    CT HEAD CODE STROKE WO CONTRAST  Final Result    DG Swallowing Func-Speech Pathology    (Results Pending)    Scheduled Meds: . aspirin  300 mg Rectal Daily  . atorvastatin  20 mg Oral Daily  . FLUoxetine  40 mg Oral BID  . fondaparinux (ARIXTRA) injection  2.5 mg Subcutaneous Daily  . haloperidol  2 mg Oral BID  . lamoTRIgine  125 mg Oral BID  . levothyroxine  50 mcg Oral QAC breakfast  . loratadine  10 mg Oral Daily  . polyethylene glycol  17 g Oral Daily   PRN Meds: acetaminophen **OR** acetaminophen (TYLENOL)  oral liquid 160 mg/5 mL **OR** acetaminophen, senna-docusate Continuous Infusions: . sodium chloride 75 mL/hr at 12/20/19 1255     LOS: 1 day  Time spent: Greater than 50% of the 35 minute visit was spent in counseling/coordination of care for the patient as laid out in the A&P.   Dwyane Dee, MD [facility] 12/20/2019, 1:49 PM

## 2019-12-20 NOTE — Evaluation (Signed)
Speech Language Pathology Evaluation Patient Details Name: LINDWOOD MOGEL MRN: [id number] DOB: [birth year] Today's Date: 12/20/2019 Time: 1000-1028 SLP Time Calculation (min) (ACUTE ONLY): 28 min  Problem List:  Patient Active Problem List   Diagnosis Date Noted  . CVA (cerebral vascular accident) ([place]) 12/19/2019  . Hallucinations 10/28/2019  . BMI 30.0-30.9,adult 07/04/2019  . GAD (generalized anxiety disorder) 03/28/2019  . Mixed hyperlipidemia 03/28/2019  . History of radiation therapy 03/27/2019  . Testosterone deficiency in male 03/27/2019  . Secondary hypothyroidism 03/27/2019  . 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 Dysphagia   . Traumatic subdural hematoma ([place]) 04/24/2017  . Abnormal movements   . Traumatic subdural hematoma with loss of consciousness of 1 hour to 5 hours 59 minutes ([place]) 04/09/2017  . Other specified strabismus 08/05/2014  . Presbyopia 08/05/2014  . Bradycardia 12/21/2013  . C. difficile colitis 12/21/2013  . Hypotension 12/21/2013  . Prerenal azotemia 12/21/2013  . 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 H/O: stroke 12/08/2013  . Pneumonia 12/08/2013  . Hyponatremia 08/19/2013  . Intracranial meningioma ([place]) 09/30/2002  . Seizure disorder ([place]) 03/18/1991   Past Medical History:  Past Medical History:  Diagnosis Date  . Brain cancer ([place])   . Hiccough   . Hypercholesteremia   . Other elevated white blood cell count   . Other encephalitis and encephalomyelitis   . Posterior reversible encephalopathy syndrome   . Seizure ([place])   . Stroke ([place])   . Testicular hypofunction   . Thyroid disorder    Past Surgical History:  Past Surgical History:  Procedure Laterality Date  . APPENDECTOMY     1980s  . BRAIN SURGERY     1993  . CRANIOTOMY Right 04/09/2017   Procedure: CRANIOTOMY HEMATOMA EVACUATION SUBDURAL;  Surgeon: Erline Levine, MD;  Location: [facility];  Service: Neurosurgery;  Laterality:  Right;  . DIRECT LARYNGOSCOPY N/A 04/09/2017   Procedure: DIRECT LARYNGOSCOPY;  Surgeon: Erline Levine, MD;  Location: [facility];  Service: Neurosurgery;  Laterality: N/A;  . DIRECT LARYNGOSCOPY N/A 04/09/2017   Procedure: DIRECT LARYNGOSCOPY;  Surgeon: [name], MD;  Location: [facility];  Service: ENT;  Laterality: N/A;  . DIRECT LARYNGOSCOPY N/A 04/18/2017   Procedure: DIRECT LARYNGOSCOPY;  Surgeon: [name], MD;  Location: [facility];  Service: ENT;  Laterality: N/A;  . EYE SURGERY     X5  . TRACHEOSTOMY TUBE PLACEMENT N/A 04/18/2017   Procedure: TRANSNASAL FIBER OPTIC LARYNGOSCOPY;  Surgeon: [name], MD;  Location: [facility];  Service: ENT;  Laterality: N/A;   HPI:  PHOENIX DRESSER is a 49 y.o. male with medical history significant of fourth ventricle medulla blastoma, status post surgery in 1995, left sided meningioma status post resection in 2004, seizure disorder, chronic ambulatory dysfunction on roller walker, hypothyroidism, presented with new onset of left-sided weakness, choking on food and overall mental status.  MRI shows Acute infarct of the right pons. Mother lives with pt and reported that while eating lunch, patient suddenly choked on his food, having strenuous coughing and became unresponsive, head leaning toward right side and unable to move left-sided arm or leg.  Mother also reported that for the last week, patient has had trouble eating his food with frequent cough after swallowing. in 2019 he was admitted to CIR after SDH with craniectomy and was discharged on a puree diet due to perseverative chewing had severe cognitive impairment. At baseline he is only able to speak a few words.    Assessment /  Plan / Recommendation Clinical Impression  Pt demonstrates severe cognitive and speech impairment, not suspected to be significantly different from pts baseline. He is alert, attentive to speaker but unable to respond consistently to auditory commands even with loud volume and visual  reinforcement in better than 50% of opportunities. He is able to say his name and his mother and sister's names. He can state that he is in the hospital and he doesnt know why. Otherwise when asked to name pictures, repeat words, hold up fingers point or participate in self feeding pt often did not respond. His speech is moderately dysarthric with lingual, labial and likely velar weakness. Not sure how different this it from his baseline without family present to confirm. Will follow briefly for cognitive needs, though suspect further acute intervention in this area will not yield measurable gains given severity of baseline impairment.     SLP Assessment  SLP Recommendation/Assessment: Patient needs continued Speech Lanaguage Pathology Services SLP Visit Diagnosis: Cognitive communication deficit (R41.841)    Follow Up Recommendations       Frequency and Duration min 2x/week  2 weeks      SLP Evaluation Cognition  Overall Cognitive Status: History of cognitive impairments - at baseline Arousal/Alertness: Awake/alert Orientation Level: Oriented to person;Oriented to place;Disoriented to time;Disoriented to situation Attention: Focused;Sustained Focused Attention: Appears intact Sustained Attention: Impaired Sustained Attention Impairment: Verbal complex;Functional complex Memory:  (UTA)       Comprehension  Auditory Comprehension Overall Auditory Comprehension: Impaired Yes/No Questions: Impaired Basic Biographical Questions: 26-50% accurate Commands: Impaired One Step Basic Commands: 25-49% accurate Conversation: Simple Interfering Components: Hearing;Visual impairments    Expression Verbal Expression Overall Verbal Expression: Impaired Initiation: No impairment Automatic Speech: Name;Social Response Level of Generative/Spontaneous Verbalization: Word Repetition: Impaired Level of Impairment: Word level Naming: Impairment Confrontation: Impaired   Oral / Motor  Oral  Motor/Sensory Function Overall Oral Motor/Sensory Function: Moderate impairment Facial ROM: Reduced left;Suspected CN VII (facial) dysfunction Facial Symmetry: Abnormal symmetry left;Suspected CN VII (facial) dysfunction Facial Strength: Reduced left;Suspected CN VII (facial) dysfunction Lingual ROM: Reduced left;Suspected CN XII (hypoglossal) dysfunction Lingual Symmetry: Within Functional Limits Lingual Strength: Suspected CN XII (hypoglossal) dysfunction Velum:  (unabel to visualize) Motor Speech Overall Motor Speech: Impaired Respiration: Within functional limits Resonance: Hypernasality Articulation: Impaired Level of Impairment: Word Intelligibility: Intelligibility reduced Word: 25-49% accurate Phrase: 25-49% accurate Sentence: Not tested Conversation: Not tested Motor Planning: Witnin functional limits Motor Speech Errors: Consistent   GO                   Herbie Baltimore, MA [place] Pager [phone] Office [phone]  Lynann Beaver 12/20/2019, 10:49 AM

## 2019-12-20 NOTE — ED Notes (Signed)
Patient transported to MRI 

## 2019-12-20 NOTE — ED Notes (Signed)
Patient transported to CT 

## 2019-12-20 NOTE — Progress Notes (Signed)
Rehab Admissions Coordinator Note:  Per PT recommendation, this patient was screened by Raechel Ache for appropriateness for an Inpatient Acute Rehab Consult.  At this time, we are recommending Inpatient Rehab consult. AC will place order per protocol for further assessment.   Raechel Ache 12/20/2019, 11:09 AM  I can be reached at [phone].

## 2019-12-20 NOTE — Consult Note (Signed)
Physical Medicine and Rehabilitation Consult   Reason for Consult: Stroke with functional deficits. Referring Physician: Dr. Tenny Craw    HPI: Glen [name] is a 49 y.o. male with history of fourth ventricle medulloblastoma s/p surgery, PR ES, seizure disorder who was admitted on 12/19/2019 with new onset of left-sided weakness, inability to speak, mental status changes and difficulty swallowing. UDS negative. CTA head/neck was negative for high-grade stenosis or occlusion and showed incidental meningioma anterior left convexity. MRI brain showed acute infarct in right pons with severe atrophy and left frontal VP shunt. Stroke work-up underway. He remains n.p.o. due to signs of oropharyngeal dysphagia. Therapy evaluations completed today revealing severe cognitive and speech impairment--question at baseline, significant left-sided weakness with balance deficits and difficulty with standing attempts. CIR recommended due to functional decline.   Review of Systems  Unable to perform ROS: Mental acuity     Past Medical History:  Diagnosis Date  . Brain cancer (Glen [name])   . Hiccough   . Hypercholesteremia   . Other elevated white blood cell count   . Other encephalitis and encephalomyelitis   . Posterior reversible encephalopathy syndrome   . Seizure (Glen [name])   . Stroke (Glen [name])   . Testicular hypofunction   . Thyroid disorder     Past Surgical History:  Procedure Laterality Date  . APPENDECTOMY     1980s  . BRAIN SURGERY     1993  . CRANIOTOMY Right 04/09/2017   Procedure: CRANIOTOMY HEMATOMA EVACUATION SUBDURAL;  Surgeon: Glen Levine, MD;  Location: [place];  Service: Neurosurgery;  Laterality: Right;  . DIRECT LARYNGOSCOPY N/A 04/09/2017   Procedure: DIRECT LARYNGOSCOPY;  Surgeon: Glen Levine, MD;  Location: [place];  Service: Neurosurgery;  Laterality: N/A;  . DIRECT LARYNGOSCOPY N/A 04/09/2017   Procedure: DIRECT LARYNGOSCOPY;  Surgeon: Glen Quitter, MD;  Location: [place];   Service: ENT;  Laterality: N/A;  . DIRECT LARYNGOSCOPY N/A 04/18/2017   Procedure: DIRECT LARYNGOSCOPY;  Surgeon: Glen Quitter, MD;  Location: [place];  Service: ENT;  Laterality: N/A;  . EYE SURGERY     X5  . TRACHEOSTOMY TUBE PLACEMENT N/A 04/18/2017   Procedure: TRANSNASAL FIBER OPTIC LARYNGOSCOPY;  Surgeon: Glen Quitter, MD;  Location: [place];  Service: ENT;  Laterality: N/A;    Family History  Adopted: Yes  Family history unknown: Yes    Social History:  reports that he has never smoked. He has never used smokeless tobacco. He reports that he does not drink alcohol and does not use drugs.    Allergies  Allergen Reactions  . Heparin Anaphylaxis    Patient has HITT  . Lidocaine Hives  . Quetiapine Other (See Comments)    Confusion  . Depakote [Divalproex Sodium] Rash  . Phenytoin Sodium Extended Rash    Medications Prior to Admission  Medication Sig Dispense Refill  . atorvastatin (LIPITOR) 20 MG tablet TAKE 1 TABLET BY MOUTH EVERY DAY (Patient taking differently: Take 20 mg by mouth daily. ) 90 tablet 2  . FLUoxetine (PROZAC) 40 MG capsule TAKE 1 CAPSULE BY MOUTH TWICE A DAY (Patient taking differently: Take 40 mg by mouth 2 (two) times daily. ) 180 capsule 0  . haloperidol (HALDOL) 2 MG tablet TAKE 1 TABLET (2 MG TOTAL) BY MOUTH 2 (TWO) TIMES DAILY. 60 tablet 2  . lamoTRIgine (LAMICTAL) 25 MG tablet TAKE 1 TABLET (25 MG TOTAL) BY MOUTH 2 (TWO) TIMES DAILY. TAKE WITH 1/2 TABLET (100MG  TABLET) TO MAKE 75MG  TWICE DAILY.  60 tablet 1  . lamoTRIgine 100 MG TBDP Take 50 mg by mouth 2 (two) times daily. Take with 25mg  tablet for a total of 75mg  twice daily.    Glen [name] levothyroxine (SYNTHROID) 50 MCG tablet TAKE 1 TABLET BY MOUTH DAILY BEFORE BREAKFAST (Patient taking differently: Take 50 mcg by mouth daily before breakfast. ) 90 tablet 1  . loratadine (CLARITIN) 10 MG tablet Take 1 tablet (10 mg total) by mouth daily. 90 tablet 3  . polyethylene glycol (MIRALAX / GLYCOLAX) packet Take 17 g by  mouth daily. (Patient taking differently: Take 17 g by mouth daily as needed for mild constipation. ) 14 each 0  . testosterone cypionate (DEPOTESTOSTERONE CYPIONATE) 200 MG/ML injection Inject 1 mL (200 mg total) into the muscle every 14 (fourteen) days. 10 mL 0    Home: Home Living Family/patient expects to be discharged to:: Private residence Living Arrangements: Parent (mother, per chart) Available Help at Discharge: Family Type of Home: House Additional Comments: Pt unable to verbalize home set-up or PLOF. Attempted to contact mother through phone several times, with no answer. Per chart review: pt has communication deficits including speaking very few words and being difficult to understand and pt also walks sometimes with a walker.  Lives With: Family  Functional History: Prior Function Comments: Pt unable to provide history and unable to get a hold of mother via phone. Walks sometimes wityh a walker, per chart. Functional Status:  Mobility: Bed Mobility Overal bed mobility: Needs Assistance Bed Mobility: Supine to Sit, Rolling Rolling: Max assist Supine to sit: Max assist, HOB elevated General bed mobility comments: Attempted roll to either side for bed pan placement and removal with pt unable to initiate roll to R but did attempt to reach with L hand for R bed rail with maxA and hand-over-hand placement to complete. MaxA with HOB elevated to manage legs and trunk to sit EOB, with some initiation noted at R leg. Transfers Overall transfer level: Needs assistance Equipment used: 1 person hand held assist Transfers: Sit to/from Stand, Stand Pivot Transfers Sit to Stand: Max assist, From elevated surface Stand pivot transfers: Max assist, From elevated surface General transfer comment: Sit to stand and then pivot to R from bed to recliner with maxA to initiate and direct buttocks to R as pt pushes through R leg towards the L.  Ambulation/Gait General Gait Details: Deferred     ADL:    Cognition: Cognition Overall Cognitive Status: History of cognitive impairments - at baseline Arousal/Alertness: Awake/alert Orientation Level: Oriented to person, Oriented to place, Disoriented to time, Disoriented to situation Attention: Focused, Sustained Focused Attention: Appears intact Sustained Attention: Impaired Sustained Attention Impairment: Verbal complex, Functional complex Memory:  (UTA) Cognition Arousal/Alertness: Awake/alert Behavior During Therapy: Flat affect Overall Cognitive Status: History of cognitive impairments - at baseline General Comments: Pt able to follow ~15% of multi-modal cues. Pt unable to correct postures without max cues and assist to maintain safety.    Blood pressure 104/66, pulse 79, temperature 98 F (36.7 C), temperature source Oral, resp. rate (!) 21, height 6\' 2"  (1.88 m), weight 86 kg, SpO2 95 %. Physical Exam  General: Lethargic, No apparent distress HEENT: Bald --well healed old incision with scalp deformity noted.  Neck: Supple without JVD or lymphadenopathy Heart: Reg rate and rhythm. No murmurs rubs or gallops Chest: Tachypneic Abdomen: Soft, non-tender, non-distended, bowel sounds positive. Extremities: No clubbing, cyanosis, or edema. Pulses are 2+ Skin: Clean and intact without signs of breakdown Neuro: Left inattention  and did not attempt to make eye contact. He was non verbal--except to state  "Ok" when discussing home. Did not attempt to follow any motor commands or interact.  Psych: Pt's affect is appropriate. Pt is cooperative  Results for orders placed or performed during the hospital encounter of 12/19/19 (from the past 24 hour(s))  Protime-INR     Status: None   Collection Time: 12/19/19  2:32 PM  Result Value Ref Range   Prothrombin Time 13.9 11.4 - 15.2 seconds   INR 1.1 0.8 - 1.2  APTT     Status: None   Collection Time: 12/19/19  2:32 PM  Result Value Ref Range   aPTT 31 24 - 36 seconds  CBC      Status: None   Collection Time: 12/19/19  2:32 PM  Result Value Ref Range   WBC 8.5 4.0 - 10.5 K/uL   RBC 4.58 4.22 - 5.81 MIL/uL   Hemoglobin 13.3 13.0 - 17.0 g/dL   HCT 40.9 39 - 52 %   MCV 89.3 80.0 - 100.0 fL   MCH 29.0 26.0 - 34.0 pg   MCHC 32.5 30.0 - 36.0 g/dL   RDW 12.5 11.5 - 15.5 %   Platelets 198 150 - 400 K/uL   nRBC 0.0 0.0 - 0.2 %  Differential     Status: None   Collection Time: 12/19/19  2:32 PM  Result Value Ref Range   Neutrophils Relative % 65 %   Neutro Abs 5.6 1.7 - 7.7 K/uL   Lymphocytes Relative 24 %   Lymphs Abs 2.0 0.7 - 4.0 K/uL   Monocytes Relative 7 %   Monocytes Absolute 0.6 0.1 - 1.0 K/uL   Eosinophils Relative 2 %   Eosinophils Absolute 0.2 0.0 - 0.5 K/uL   Basophils Relative 1 %   Basophils Absolute 0.0 0.0 - 0.1 K/uL   Immature Granulocytes 1 %   Abs Immature Granulocytes 0.04 0.00 - 0.07 K/uL  Comprehensive metabolic panel     Status: Abnormal   Collection Time: 12/19/19  2:32 PM  Result Value Ref Range   Sodium 140 135 - 145 mmol/L   Potassium 3.3 (L) 3.5 - 5.1 mmol/L   Chloride 107 98 - 111 mmol/L   CO2 22 22 - 32 mmol/L   Glucose, Bld 91 70 - 99 mg/dL   BUN 8 6 - 20 mg/dL   Creatinine, Ser 1.21 0.61 - 1.24 mg/dL   Calcium 8.2 (L) 8.9 - 10.3 mg/dL   Total Protein 5.4 (L) 6.5 - 8.1 g/dL   Albumin 3.1 (L) 3.5 - 5.0 g/dL   AST 27 15 - 41 U/L   ALT 37 0 - 44 U/L   Alkaline Phosphatase 64 38 - 126 U/L   Total Bilirubin 0.6 0.3 - 1.2 mg/dL   GFR, Estimated >60 >60 mL/min   Anion gap 11 5 - 15  CBG monitoring, ED     Status: None   Collection Time: 12/19/19  2:32 PM  Result Value Ref Range   Glucose-Capillary 96 70 - 99 mg/dL  I-stat chem 8, ED     Status: Abnormal   Collection Time: 12/19/19  2:45 PM  Result Value Ref Range   Sodium 141 135 - 145 mmol/L   Potassium 3.1 (L) 3.5 - 5.1 mmol/L   Chloride 106 98 - 111 mmol/L   BUN 8 6 - 20 mg/dL   Creatinine, Ser 1.10 0.61 - 1.24 mg/dL   Glucose, Bld 86 70 -  99 mg/dL   Calcium, Ion  1.03 (L) 1.15 - 1.40 mmol/L   TCO2 21 (L) 22 - 32 mmol/L   Hemoglobin 13.3 13.0 - 17.0 g/dL   HCT 39.0 39 - 52 %  Ethanol     Status: None   Collection Time: 12/19/19  4:31 PM  Result Value Ref Range   Alcohol, Ethyl (B) <10 <10 mg/dL  Urine rapid drug screen (hosp performed)     Status: None   Collection Time: 12/19/19  4:31 PM  Result Value Ref Range   Opiates NONE DETECTED NONE DETECTED   Cocaine NONE DETECTED NONE DETECTED   Benzodiazepines NONE DETECTED NONE DETECTED   Amphetamines NONE DETECTED NONE DETECTED   Tetrahydrocannabinol NONE DETECTED NONE DETECTED   Barbiturates NONE DETECTED NONE DETECTED  Urinalysis, Routine w reflex microscopic Urine, Clean Catch     Status: Abnormal   Collection Time: 12/19/19  4:31 PM  Result Value Ref Range   Color, Urine YELLOW YELLOW   APPearance HAZY (A) CLEAR   Specific Gravity, Urine 1.019 1.005 - 1.030   pH 5.0 5.0 - 8.0   Glucose, UA NEGATIVE NEGATIVE mg/dL   Hgb urine dipstick NEGATIVE NEGATIVE   Bilirubin Urine NEGATIVE NEGATIVE   Ketones, ur NEGATIVE NEGATIVE mg/dL   Protein, ur NEGATIVE NEGATIVE mg/dL   Nitrite NEGATIVE NEGATIVE   Leukocytes,Ua NEGATIVE NEGATIVE  Resp Panel by RT-PCR (Flu A&B, Covid) Nasopharyngeal Swab     Status: None   Collection Time: 12/19/19  4:31 PM   Specimen: Nasopharyngeal Swab; Nasopharyngeal(NP) swabs in vial transport medium  Result Value Ref Range   SARS Coronavirus 2 by RT PCR NEGATIVE NEGATIVE   Influenza A by PCR NEGATIVE NEGATIVE   Influenza B by PCR NEGATIVE NEGATIVE  HIV Antibody (routine testing w rflx)     Status: None   Collection Time: 12/20/19  2:50 AM  Result Value Ref Range   HIV Screen 4th Generation wRfx Non Reactive Non Reactive  Hemoglobin A1c     Status: None   Collection Time: 12/20/19  2:50 AM  Result Value Ref Range   Hgb A1c MFr Bld 5.4 4.8 - 5.6 %   Mean Plasma Glucose 108.28 mg/dL  Lipid panel     Status: Abnormal   Collection Time: 12/20/19  2:50 AM  Result Value  Ref Range   Cholesterol 112 0 - 200 mg/dL   Triglycerides 67 <150 mg/dL   HDL 37 (L) >40 mg/dL   Total CHOL/HDL Ratio 3.0 RATIO   VLDL 13 0 - 40 mg/dL   LDL Cholesterol 62 0 - 99 mg/dL  Basic metabolic panel     Status: None   Collection Time: 12/20/19  2:50 AM  Result Value Ref Range   Sodium 135 135 - 145 mmol/L   Potassium 4.3 3.5 - 5.1 mmol/L   Chloride 101 98 - 111 mmol/L   CO2 23 22 - 32 mmol/L   Glucose, Bld 87 70 - 99 mg/dL   BUN 10 6 - 20 mg/dL   Creatinine, Ser 0.99 0.61 - 1.24 mg/dL   Calcium 9.0 8.9 - 10.3 mg/dL   GFR, Estimated >60 >60 mL/min   Anion gap 11 5 - 15  CBC with Differential/Platelet     Status: Abnormal   Collection Time: 12/20/19  2:50 AM  Result Value Ref Range   WBC 15.3 (H) 4.0 - 10.5 K/uL   RBC 4.89 4.22 - 5.81 MIL/uL   Hemoglobin 14.2 13.0 - 17.0 g/dL  HCT 43.8 39 - 52 %   MCV 89.6 80.0 - 100.0 fL   MCH 29.0 26.0 - 34.0 pg   MCHC 32.4 30.0 - 36.0 g/dL   RDW 12.6 11.5 - 15.5 %   Platelets 203 150 - 400 K/uL   nRBC 0.0 0.0 - 0.2 %   Neutrophils Relative % 80 %   Neutro Abs 12.5 (H) 1.7 - 7.7 K/uL   Lymphocytes Relative 10 %   Lymphs Abs 1.5 0.7 - 4.0 K/uL   Monocytes Relative 8 %   Monocytes Absolute 1.1 (H) 0.1 - 1.0 K/uL   Eosinophils Relative 1 %   Eosinophils Absolute 0.1 0.0 - 0.5 K/uL   Basophils Relative 0 %   Basophils Absolute 0.0 0.0 - 0.1 K/uL   Immature Granulocytes 1 %   Abs Immature Granulocytes 0.08 (H) 0.00 - 0.07 K/uL   CT ANGIO HEAD W OR WO CONTRAST  Result Date: 12/20/2019 CLINICAL DATA:  [place] infarct EXAM: CT ANGIOGRAPHY HEAD AND NECK TECHNIQUE: Multidetector CT imaging of the head and neck was performed using the standard protocol during bolus administration of intravenous contrast. Multiplanar CT image reconstructions and MIPs were obtained to evaluate the vascular anatomy. Carotid stenosis measurements (when applicable) are obtained utilizing NASCET criteria, using the distal internal carotid diameter as the  denominator. CONTRAST:  69mL OMNIPAQUE IOHEXOL 350 MG/ML SOLN COMPARISON:  Brain MRI 12/19/2019 FINDINGS: CTA NECK FINDINGS SKELETON: There is no bony spinal canal stenosis. No lytic or blastic lesion. OTHER NECK: Normal pharynx, larynx and major salivary glands. No cervical lymphadenopathy. Unremarkable thyroid gland. UPPER CHEST: No pneumothorax or pleural effusion. No nodules or masses. AORTIC ARCH: There is no calcific atherosclerosis of the aortic arch. There is no aneurysm, dissection or hemodynamically significant stenosis of the visualized portion of the aorta. Conventional 3 vessel aortic branching pattern. The visualized proximal subclavian arteries are widely patent. RIGHT CAROTID SYSTEM: Normal without aneurysm, dissection or stenosis. LEFT CAROTID SYSTEM: Normal without aneurysm, dissection or stenosis. VERTEBRAL ARTERIES: Left dominant configuration. Both origins are clearly patent. There is no dissection, occlusion or flow-limiting stenosis to the skull base (V1-V3 segments). CTA HEAD FINDINGS There is a meningioma of the anterior left convexity that measures 1.3 x 1.0 cm. Second meningioma adjacent to the left cavernous sinus versus venous contrast blush. POSTERIOR CIRCULATION: --Vertebral arteries: Normal V4 segments. --Inferior cerebellar arteries: Normal. --Basilar artery: Normal. --Superior cerebellar arteries: Normal. --Posterior cerebral arteries (PCA): Normal. ANTERIOR CIRCULATION: --Intracranial internal carotid arteries: Normal. --Anterior cerebral arteries (ACA): Normal. Both A1 segments are present. Patent anterior communicating artery (a-comm). --Middle cerebral arteries (MCA): Normal. VENOUS SINUSES: As permitted by contrast timing, patent. ANATOMIC VARIANTS: None Review of the MIP images confirms the above findings. IMPRESSION: 1. No intracranial arterial occlusion or high-grade stenosis. 2. A 1.3 x 1.0 cm meningioma of the anterior left convexity. Suspect second meningioma adjacent to  the left cavernous sinus. 3. Normal CTA of the neck. Electronically Signed   By: Ulyses Jarred M.D.   On: 12/20/2019 01:53   CT ANGIO NECK W OR WO CONTRAST  Result Date: 12/20/2019 CLINICAL DATA:  [place] infarct EXAM: CT ANGIOGRAPHY HEAD AND NECK TECHNIQUE: Multidetector CT imaging of the head and neck was performed using the standard protocol during bolus administration of intravenous contrast. Multiplanar CT image reconstructions and MIPs were obtained to evaluate the vascular anatomy. Carotid stenosis measurements (when applicable) are obtained utilizing NASCET criteria, using the distal internal carotid diameter as the denominator. CONTRAST:  41mL OMNIPAQUE IOHEXOL 350  MG/ML SOLN COMPARISON:  Brain MRI 12/19/2019 FINDINGS: CTA NECK FINDINGS SKELETON: There is no bony spinal canal stenosis. No lytic or blastic lesion. OTHER NECK: Normal pharynx, larynx and major salivary glands. No cervical lymphadenopathy. Unremarkable thyroid gland. UPPER CHEST: No pneumothorax or pleural effusion. No nodules or masses. AORTIC ARCH: There is no calcific atherosclerosis of the aortic arch. There is no aneurysm, dissection or hemodynamically significant stenosis of the visualized portion of the aorta. Conventional 3 vessel aortic branching pattern. The visualized proximal subclavian arteries are widely patent. RIGHT CAROTID SYSTEM: Normal without aneurysm, dissection or stenosis. LEFT CAROTID SYSTEM: Normal without aneurysm, dissection or stenosis. VERTEBRAL ARTERIES: Left dominant configuration. Both origins are clearly patent. There is no dissection, occlusion or flow-limiting stenosis to the skull base (V1-V3 segments). CTA HEAD FINDINGS There is a meningioma of the anterior left convexity that measures 1.3 x 1.0 cm. Second meningioma adjacent to the left cavernous sinus versus venous contrast blush. POSTERIOR CIRCULATION: --Vertebral arteries: Normal V4 segments. --Inferior cerebellar arteries: Normal. --Basilar artery:  Normal. --Superior cerebellar arteries: Normal. --Posterior cerebral arteries (PCA): Normal. ANTERIOR CIRCULATION: --Intracranial internal carotid arteries: Normal. --Anterior cerebral arteries (ACA): Normal. Both A1 segments are present. Patent anterior communicating artery (a-comm). --Middle cerebral arteries (MCA): Normal. VENOUS SINUSES: As permitted by contrast timing, patent. ANATOMIC VARIANTS: None Review of the MIP images confirms the above findings. IMPRESSION: 1. No intracranial arterial occlusion or high-grade stenosis. 2. A 1.3 x 1.0 cm meningioma of the anterior left convexity. Suspect second meningioma adjacent to the left cavernous sinus. 3. Normal CTA of the neck. Electronically Signed   By: Ulyses Jarred M.D.   On: 12/20/2019 01:53   MR BRAIN W WO CONTRAST  Result Date: 12/19/2019 CLINICAL DATA:  Stroke EXAM: MRI HEAD WITHOUT AND WITH CONTRAST TECHNIQUE: Multiplanar, multiecho pulse sequences of the brain and surrounding structures were obtained without and with intravenous contrast. CONTRAST:  44mL GADAVIST GADOBUTROL 1 MMOL/ML IV SOLN COMPARISON:  None. FINDINGS: Brain: There is an acute infarct of the right half of pons. No mass effect. There are multiple chronic microhemorrhages but no acute hemorrhage or extra-axial collection. There is right parieto-occipital encephalomalacia with diffusely advanced atrophy and ex vacuo dilatation of the ventricles. There is severe cerebellar atrophy. There is a left frontal approach shunt catheter that terminates in the 3rd ventricle. Vascular: Major flow voids are preserved. Skull and upper cervical spine: There are multiple old craniotomies. Sinuses/Orbits:No paranasal sinus fluid levels or advanced mucosal thickening. No mastoid or middle ear effusion. Normal orbits. IMPRESSION: 1. Acute infarct of the right pons. No hemorrhage or mass effect. 2. Severe atrophy. 3. Left frontal approach shunt catheter that terminates in the 3rd ventricle. Electronically  Signed   By: Ulyses Jarred M.D.   On: 12/19/2019 19:38   DG Chest Port 1 View  Result Date: 12/19/2019 CLINICAL DATA:  Evaluate for aspiration. EXAM: PORTABLE CHEST 1 VIEW COMPARISON:  04/19/2017 FINDINGS: The heart size and mediastinal contours are within normal limits. Low lung volumes. No pleural effusion or edema. No airspace densities. The visualized skeletal structures are unremarkable. IMPRESSION: Low lung volumes. No pneumonia. Electronically Signed   By: Kerby Moors M.D.   On: 12/19/2019 20:10   EEG adult  Result Date: 12/19/2019 Lora Havens, MD     12/19/2019  7:29 PM Patient Name: ARREN LAMINACK MRN: [id number] Epilepsy Attending: Lora Havens Referring Physician/Provider: Dr Amie [place] Date: 12/19/2019 Duration: 25.03 mins Patient history: 49 year old with history of seizures, hallucinations, autoimmune cerebritis  having had treatment with immunosuppressants many years ago, bilateral subdurals presenting for sudden onset of left-sided weakness. EEG to evaluate for seizure Level of alertness: Awake AEDs during EEG study: LTG Technical aspects: This EEG study was done with scalp electrodes positioned according to the 10-20 International system of electrode placement. Electrical activity was acquired at a sampling rate of 500Hz  and reviewed with a high frequency filter of 70Hz  and a low frequency filter of 1Hz . EEG data were recorded continuously and digitally stored. Description: No posterior dominant rhythm was seen. EEG showed continuous generalized and maximal right posterior quadrant 3 to 6 Hz theta-delta slowing. Hyperventilation and photic stimulation were not performed.   Parts of study were difficult to review due to significant myogenic artifact. ABNORMALITY -Continued slow, generalized and maximal right posterior quadrant IMPRESSION: This technically difficult study is suggestive of cortical dysfunction in right posterior quadrant suggestive of underlying structural  abnormality, post-ictal state. There is also moderate diffuse encephalopathy, nonspecific etiology. No seizures or epileptiform discharges were seen throughout the recording. Lora Havens   CT HEAD CODE STROKE WO CONTRAST  Result Date: 12/19/2019 CLINICAL DATA:  Code stroke. Acute neuro deficit. Left arm weakness. Left facial droop. History of craniotomy for subdural hematoma. History of brain cancer. EXAM: CT HEAD WITHOUT CONTRAST TECHNIQUE: Contiguous axial images were obtained from the base of the skull through the vertex without intravenous contrast. COMPARISON:  CT head 08/23/2019 FINDINGS: Brain: Left frontal ventricular shunt passes through the third ventral into the suprasellar cistern. This is unchanged in position. No hydrocephalus. Ventricle size stable. Small low-density chronic subdural fluid collection on the left is stable. No acute subdural hemorrhage. 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 Encephalomalacia right parietal lobe is unchanged. Extensive atrophy in the cerebellum bilaterally also unchanged. 16 mm dural based mass in the left frontal lobe unchanged from prior studies compatible with meningioma. No brain edema. Calcification in the basal ganglia bilaterally left greater than right stable. Negative for acute infarct.  Negative for acute hemorrhage. Vascular: Negative for hyperdense vessel Skull: Suboccipital craniectomy on the left. Large right hemispheric craniotomy. Sinuses/Orbits: Mild mucosal edema paranasal sinuses. Normal orbit bilaterally. Other: None ASPECTS ([place] Stroke Program Early CT Score) - Ganglionic level infarction (caudate, lentiform nuclei, internal capsule, insula, M1-M3 cortex): 7 - Supraganglionic infarction (M4-M6 cortex): 3 Total score (0-10 with 10 being normal): 10 IMPRESSION: 1. No acute abnormality and no change from the prior CT. 2. ASPECTS is 10 3. Extensive postsurgical changes with right craniotomy and left suboccipital  craniectomy. 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 4. 16 mm left frontal meningioma unchanged. 5. Code stroke imaging results were communicated on 12/19/2019 at 2:51 pm to provider Rory Percy via [facility] Electronically Signed   By: Franchot Gallo M.D.   On: 12/19/2019 14:54     Assessment/Plan: Diagnosis: Acute infarct of right pons 1. Does the need for close, 24 hr/day medical supervision in concert with the patient's rehab needs make it unreasonable for this patient to be served in a less intensive setting? Yes 2. 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 Due to bladder management, bowel management, safety, skin/wound care, disease management, medication administration, pain management and patient education, does the patient require 24 hr/day rehab nursing? Yes 4. Does the patient require coordinated care of a physician, rehab nurse, therapy disciplines of PT, OT, SLP to address physical and functional deficits in  the context of the above medical diagnosis(es)? Yes Addressing deficits in the following areas: balance, endurance, locomotion, strength, transferring, bowel/bladder control, bathing, dressing, feeding, grooming, toileting, cognition, speech, language and psychosocial support 5. Can the patient actively participate in an intensive therapy program of at least 3 hrs of therapy per day at least 5 days per week? Yes 6. The potential for patient to make measurable gains while on inpatient rehab is good 7. Anticipated functional outcomes upon discharge from inpatient rehab are min assist  with PT, min assist with OT, min assist with SLP. 8. Estimated rehab length of stay to reach the above functional goals is: 22-24 days 9. Anticipated discharge destination: Home 10. Overall Rehab/Functional  Prognosis: good  RECOMMENDATIONS: This patient's condition is appropriate for continued rehabilitative care in the following setting: CIR Patient has agreed to participate in recommended program. N/A Note that insurance prior authorization may be required for reimbursement for recommended care.  Comment: Mr. Farooqui would be a good CIR candidate if her can demonstrate tolerance for 3 hours daily therapy and if significant home support can be established.  I have personally performed a face to face diagnostic evaluation, including, but not limited to relevant history and physical exam findings, of this patient and developed relevant assessment and plan.  Additionally, I have reviewed and concur with the physician assistant's documentation above.  Leeroy Cha, MD  Bary Leriche, PA-C 12/20/2019

## 2019-12-20 NOTE — Evaluation (Signed)
Physical Therapy Evaluation Patient Details Name: Glen [name] MRN: [id number] DOB: 28-Nov-1970 Today's Date: 12/20/2019   History of Present Illness  Pt is a 49 year old male who presented with acute L-sided weakness, choking on food, unresponsiveness, and decreased mental status. Per pt's mother, the pt has been shaky and slow with movement and dropping objects more for the past week. CTA of head revealed meningioma of anterior L convexity. 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  Clinical Impression  Pt presents with condition mentioned above with deficits mentioned below, see PT Problem List. Per chart, pt has a history of communication deficits and was unable to verbalize his home set-up and PLOF. Per chart, pt walks sometimes with a walker at baseline. Unable to get in contact with his mother despite multiple attempts to call her. Pt demonstrates significant weakness in his L side and tends to push with his R leg towards his L when standing. He required maxA for all bed mobility and transfers due to his deficits in coordination, strength, and balance. Held off on gait training due to safety concerns and poor ability to follow any directions this date. Unable to formally assess leg muscular strength, coordination, dynamic proprioception, or sensation due to pt unable to follow majority of cues. Recommending CIR to maximize pt safety and independence as pt appears to be at a significantly decreased level of function and, per chart, has his mother to assist him at home. Will continue to follow acutely.     Follow Up Recommendations CIR;Supervision/Assistance - 24 hour;Supervision for mobility/OOB    Equipment Recommendations  Other (comment) (unknown as  do not know what they own yet)    Recommendations for Other Services Rehab consult     Precautions / Restrictions Precautions Precautions: Fall Precaution Comments: seizures, L hemiplegia, commuincation deficits at baseline Restrictions Weight Bearing Restrictions: No      Mobility  Bed Mobility Overal bed mobility: Needs Assistance Bed Mobility: Supine to Sit;Rolling Rolling: Max assist   Supine to sit: Max assist;HOB elevated     General bed mobility comments: Attempted roll to either side for bed pan placement and removal with pt unable to initiate roll to R but did attempt to reach with L hand for R bed rail with maxA and hand-over-hand placement to complete. MaxA with HOB elevated to manage legs and trunk to sit EOB, with some initiation noted at R leg.    Transfers Overall transfer level: Needs assistance Equipment used: 1 person hand held assist Transfers: Sit to/from [employer] Transfers Sit to Stand: Max assist;From elevated surface Stand pivot transfers: Max assist;From elevated surface       General transfer comment: Sit to stand and then pivot to R from bed to recliner with maxA to initiate and direct buttocks to R as pt pushes through R leg towards the L.   Ambulation/Gait             General Gait Details: Deferred  Stairs            Wheelchair Mobility    Modified Rankin (Stroke Patients Only) Modified Rankin (Stroke Patients Only) Pre-Morbid Rankin Score: Moderately severe disability (assumed) Modified Rankin: Severe disability     Balance Overall balance assessment: Needs assistance Sitting-balance support: Bilateral upper extremity supported;Feet supported Sitting balance-Leahy Scale: Zero  Sitting balance - Comments: MaxA to maintain balance EOB statically due to posterior lean. Postural control: Posterior lean;Left lateral lean Standing balance support: Single extremity supported;During functional activity Standing  balance-Leahy Scale: Zero Standing balance comment: MaxA to maintain balance in standing with R leg pushing with strong lean to L and posteriorly.                             Pertinent Vitals/Pain Pain Assessment: Faces Faces Pain Scale: Hurts little more Pain Location: generalized with mobility Pain Descriptors / Indicators: Grimacing;Moaning Pain Intervention(s): Limited activity within patient's tolerance;Monitored during session;Repositioned    Home Living Family/patient expects to be discharged to:: Private residence Living Arrangements: Parent (mother, per chart)               Additional Comments: Pt unable to verbalize home set-up or PLOF. Attempted to contact mother through phone several times, with no answer. Per chart review: pt has communication deficits including speaking very few words and being difficult to understand and pt also walks sometimes with a walker.    Prior Function           Comments: Pt unable to provide history and unable to get a hold of mother via phone. Walks sometimes wityh a walker, per chart.     Hand Dominance        Extremity/Trunk Assessment   Upper Extremity Assessment Upper Extremity Assessment: Defer to OT evaluation    Lower Extremity Assessment Lower Extremity Assessment: RLE deficits/detail;LLE deficits/detail RLE Deficits / Details: MMT scores grossly 4 to 5 through observing with functional mob RLE Sensation:  (unknown due to pt unable to follow cues or express self) RLE Coordination:  (unknown due to pt unable to follow cues or express self) LLE Deficits / Details: MMT scores of 0 to 3- grossly observed with functional mob LLE Sensation:  (unknown due to pt unable to follow cues or express self) LLE Coordination:  (unknown due to pt unable to follow cues or express self)    Cervical / Trunk Assessment Cervical / Trunk Assessment: Normal  Communication   Communication: Receptive difficulties;Expressive  difficulties  Cognition Arousal/Alertness: Awake/alert Behavior During Therapy: Flat affect Overall Cognitive Status: No family/caregiver present to determine baseline cognitive functioning                                 General Comments: Pt able to follow ~15% of multi-modal cues. Pt unable to correct postures without max cues and assist to maintain safety.       General Comments General comments (skin integrity, edema, etc.): HR 80-90s; SpO2 >/= 98%; BP 99/68 start of session; BP 107/77 end of session    Exercises     Assessment/Plan    PT Assessment Patient needs continued PT services  PT Problem List Decreased strength;Decreased activity tolerance;Decreased balance;Decreased mobility;Decreased coordination;Decreased cognition;Decreased knowledge of use of DME;Decreased safety awareness;Decreased knowledge of precautions       PT Treatment Interventions DME instruction;Gait training;Stair training;Functional mobility training;Therapeutic activities;Therapeutic exercise;Balance training;Neuromuscular re-education;Cognitive remediation;Patient/family education    PT Goals (Current goals can be found in the Care Plan section)  Acute Rehab PT Goals Patient Stated Goal: unable to state PT Goal Formulation: Patient unable to participate in goal setting Time For Goal Achievement: 01/03/20 Potential to Achieve Goals: Fair    Frequency Min 4X/week   Barriers to discharge  Co-evaluation               AM-PAC PT "6 Clicks" Mobility  Outcome Measure Help needed turning from your back to your side while in a flat bed without using bedrails?: A Lot Help needed moving from lying on your back to sitting on the side of a flat bed without using bedrails?: A Lot Help needed moving to and from a bed to a chair (including a wheelchair)?: A Lot Help needed standing up from a chair using your arms (e.g., wheelchair or bedside chair)?: A Lot Help needed to walk in  hospital room?: Total Help needed climbing 3-5 steps with a railing? : Total 6 Click Score: 10    End of Session Equipment Utilized During Treatment: Gait belt Activity Tolerance: Patient tolerated treatment well Patient left: in chair;with call bell/phone within reach;with chair alarm set Nurse Communication: Mobility status;Other (comment) (try xfer to L; need for touch call bell) PT Visit Diagnosis: Unsteadiness on feet (R26.81);Muscle weakness (generalized) (M62.81);Difficulty in walking, not elsewhere classified (R26.2);Other symptoms and signs involving the nervous system (R29.898);Hemiplegia and hemiparesis Hemiplegia - Right/Left: Left Hemiplegia - caused by: Cerebral infarction    Time: 0830-0910 PT Time Calculation (min) (ACUTE ONLY): 40 min   Charges:   PT Evaluation $PT Eval High Complexity: 1 High PT Treatments $Therapeutic Activity: 23-37 mins        Moishe Spice, PT, DPT Acute Rehabilitation Services  Pager: [phone] Office: [phone]   Orvan Falconer 12/20/2019, 9:38 AM

## 2019-12-20 NOTE — Evaluation (Signed)
Clinical/Bedside Swallow Evaluation Patient Details  Name: Glen [name] MRN: [id number] Date of Birth: Jun 18, 1970  Today's Date: 12/20/2019 Time: SLP Start Time (ACUTE ONLY): 1000 SLP Stop Time (ACUTE ONLY): 1010 SLP Time Calculation (min) (ACUTE ONLY): 10 min  Past Medical History:  Past Medical History:  Diagnosis Date  . Brain cancer ([place])   . Hiccough   . Hypercholesteremia   . Other elevated white blood cell count   . Other encephalitis and encephalomyelitis   . Posterior reversible encephalopathy syndrome   . Seizure ([place])   . Stroke ([place])   . Testicular hypofunction   . Thyroid disorder    Past Surgical History:  Past Surgical History:  Procedure Laterality Date  . APPENDECTOMY     1980s  . BRAIN SURGERY     1993  . CRANIOTOMY Right 04/09/2017   Procedure: CRANIOTOMY HEMATOMA EVACUATION SUBDURAL;  Surgeon: Erline Levine, MD;  Location: [place];  Service: Neurosurgery;  Laterality: Right;  . DIRECT LARYNGOSCOPY N/A 04/09/2017   Procedure: DIRECT LARYNGOSCOPY;  Surgeon: Erline Levine, MD;  Location: [facility];  Service: Neurosurgery;  Laterality: N/A;  . DIRECT LARYNGOSCOPY N/A 04/09/2017   Procedure: DIRECT LARYNGOSCOPY;  Surgeon: [name], MD;  Location: [place];  Service: ENT;  Laterality: N/A;  . DIRECT LARYNGOSCOPY N/A 04/18/2017   Procedure: DIRECT LARYNGOSCOPY;  Surgeon: [name], MD;  Location: [facility];  Service: ENT;  Laterality: N/A;  . EYE SURGERY     X5  . TRACHEOSTOMY TUBE PLACEMENT N/A 04/18/2017   Procedure: TRANSNASAL FIBER OPTIC LARYNGOSCOPY;  Surgeon: [name], MD;  Location: [place];  Service: ENT;  Laterality: N/A;   HPI:  Glen [name] is a 49 y.o. male with medical history significant of fourth ventricle medulla blastoma, status post surgery in 1995, left sided meningioma status post resection in 2004, seizure disorder, chronic ambulatory dysfunction on roller walker, hypothyroidism, Also some mild post glottic narrowing after intubation, did  not impact safety per ENT. Presented with new onset of left-sided weakness, choking on food and overall mental status.  MRI shows Acute infarct of the right pons. Mother lives with pt and reported that while eating lunch, patient suddenly choked on his food, having strenuous coughing and became unresponsive, head leaning toward right side and unable to move left-sided arm or leg.  Mother also reported that for the last week, patient has had trouble eating his food with frequent cough after swallowing. in 2019 he was admitted to CIR after SDH with craniectomy and was discharged on a puree diet due to perseverative chewing had severe cognitive impairment. At baseline he is only able to speak a few words.    Assessment / Plan / Recommendation Clinical Impression  Pt demosntrates signs of oropahryngeal dysphagia with likely some baseline impairment given chronic neuromsucualr impairments. Pt has left CN VII weakness with labial droop, possible also CN XII weaknes son left as pt would not alteralize tongue to the left. duration of time between presentation of ice, sip of water and puree and swallow initiation is prolonged and followed by delayed, hard coughing. Recommend pt remain NPO, SLP will f/u for MBS for instrumental assessment of swallowing.  SLP Visit Diagnosis: Dysphagia, oropharyngeal phase (R13.12)    Aspiration Risk  Severe aspiration risk    Diet Recommendation NPO        Other  Recommendations     Follow up Recommendations        Frequency and Duration  Prognosis        Swallow Study   General HPI: Glen [name] is a 49 y.o. male with medical history significant of fourth ventricle medulla blastoma, status post surgery in 1995, left sided meningioma status post resection in 2004, seizure disorder, chronic ambulatory dysfunction on roller walker, hypothyroidism, presented with new onset of left-sided weakness, choking on food and overall mental status.  MRI shows  Acute infarct of the right pons. Mother lives with pt and reported that while eating lunch, patient suddenly choked on his food, having strenuous coughing and became unresponsive, head leaning toward right side and unable to move left-sided arm or leg.  Mother also reported that for the last week, patient has had trouble eating his food with frequent cough after swallowing. in 2019 he was admitted to CIR after SDH with craniectomy and was discharged on a puree diet due to perseverative chewing had severe cognitive impairment. At baseline he is only able to speak a few words.  Type of Study: Bedside Swallow Evaluation Previous Swallow Assessment: see HPI Diet Prior to this Study: NPO Temperature Spikes Noted: No Respiratory Status: Room air History of Recent Intubation: No Behavior/Cognition: Alert;Cooperative;Pleasant mood;Requires cueing Oral Cavity Assessment: Within Functional Limits Oral Care Completed by SLP: No Oral Cavity - Dentition: Poor condition;Missing dentition Self-Feeding Abilities: Needs assist Patient Positioning: Upright in chair Baseline Vocal Quality: Normal Volitional Cough: Cognitively unable to elicit Volitional Swallow: Able to elicit    Oral/Motor/Sensory Function Overall Oral Motor/Sensory Function: Moderate impairment Facial ROM: Reduced left;Suspected CN VII (facial) dysfunction Facial Symmetry: Abnormal symmetry left;Suspected CN VII (facial) dysfunction Facial Strength: Reduced left;Suspected CN VII (facial) dysfunction Lingual ROM: Reduced left;Suspected CN XII (hypoglossal) dysfunction Lingual Symmetry: Within Functional Limits Lingual Strength: Suspected CN XII (hypoglossal) dysfunction Velum:  (unabel to visualize)   Ice Chips Ice chips: Impaired Presentation: Spoon Oral Phase Functional Implications: Prolonged oral transit Pharyngeal Phase Impairments: Suspected delayed Swallow   Thin Liquid Thin Liquid: Impaired Presentation: Cup Oral Phase  Functional Implications: Prolonged oral transit Pharyngeal  Phase Impairments: Suspected delayed Swallow;Cough - Delayed    Nectar Thick Nectar Thick Liquid: Not tested   Honey Thick Honey Thick Liquid: Not tested   Puree Puree: Impaired Presentation: Spoon Pharyngeal Phase Impairments: Cough - Delayed   Solid     Solid: Not tested     Herbie Baltimore, MA [place] Pager [phone] Office [phone]  Trenten Watchman, Katherene Ponto 12/20/2019,10:37 AM

## 2019-12-20 NOTE — Progress Notes (Signed)
STROKE TEAM PROGRESS NOTE   INTERVAL HISTORY No family at bedside.  Patient lying in bed, nonverbal, limited eyes tracking, still has left arm weakness.  MRI showed right pontine infarct.  Did not pass swallow, currently n.p.o., on aspirin PR.  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  History of seizure on Lamictal.  Vitals:   12/20/19 0500 12/20/19 0530 12/20/19 0607 12/20/19 0934  BP: 117/67 (!) 112/56 116/74 103/68  Pulse: 86 87 84 81  Resp: (!) 23 (!) 22 (!) 22 19  Temp:    98.2 F (36.8 C)  TempSrc:    Oral  SpO2: 92% 99% 92% 96%  Weight:      Height:       CBC:  Recent Labs  Lab 12/19/19 1432 12/19/19 1432 12/19/19 1445 12/20/19 0250  WBC 8.5  --   --  15.3*  NEUTROABS 5.6  --   --  12.5*  HGB 13.3   < > 13.3 14.2  HCT 40.9   < > 39.0 43.8  MCV 89.3  --   --  89.6  PLT 198  --   --  203   < > = values in this interval not displayed.   Basic Metabolic Panel:  Recent Labs  Lab 12/19/19 1432 12/19/19 1432 12/19/19 1445 12/20/19 0250  NA 140   < > 141 135  K 3.3*   < > 3.1* 4.3  CL 107   < > 106 101  CO2 22  --   --  23  GLUCOSE 91   < > 86 87  BUN 8   < > 8 10  CREATININE 1.21   < > 1.10 0.99  CALCIUM 8.2*  --   --  9.0   < > = values in this interval not displayed.   Lipid Panel:  Recent Labs  Lab 12/20/19 0250  CHOL 112  TRIG 67  HDL 37*  CHOLHDL 3.0  VLDL 13  LDLCALC 62   HgbA1c:  Recent Labs  Lab 12/20/19 0250  HGBA1C 5.4   Urine Drug Screen:  Recent Labs  Lab 12/19/19 1631  LABOPIA NONE DETECTED  COCAINSCRNUR NONE DETECTED  LABBENZ NONE DETECTED  AMPHETMU NONE DETECTED  THCU NONE DETECTED  LABBARB NONE DETECTED    Alcohol Level  Recent Labs  Lab 12/19/19 1631  ETH <10    IMAGING past 24 hours CT ANGIO HEAD W OR WO CONTRAST  Result Date: 12/20/2019 CLINICAL DATA:  Pontine infarct EXAM: CT  ANGIOGRAPHY HEAD AND NECK TECHNIQUE: Multidetector CT imaging of the head and neck was performed using the standard protocol during bolus administration of intravenous contrast. Multiplanar CT image reconstructions and MIPs were obtained to evaluate the vascular anatomy. Carotid stenosis measurements (when applicable) are obtained utilizing NASCET criteria, using the distal internal carotid diameter as the denominator. CONTRAST:  65mL OMNIPAQUE IOHEXOL 350 MG/ML SOLN COMPARISON:  Brain MRI 12/19/2019 FINDINGS: CTA NECK FINDINGS SKELETON: There is no bony spinal canal stenosis. No lytic or blastic lesion. OTHER NECK: Normal pharynx, larynx and major salivary glands. No cervical lymphadenopathy. Unremarkable thyroid gland. UPPER CHEST: No pneumothorax or pleural effusion. No nodules or masses. AORTIC ARCH: There is no calcific atherosclerosis of the aortic arch. There is no aneurysm, dissection or hemodynamically significant stenosis of the visualized portion of the aorta. Conventional 3 vessel aortic branching pattern. The visualized proximal subclavian arteries  are widely patent. RIGHT CAROTID SYSTEM: Normal without aneurysm, dissection or stenosis. LEFT CAROTID SYSTEM: Normal without aneurysm, dissection or stenosis. VERTEBRAL ARTERIES: Left dominant configuration. Both origins are clearly patent. There is no dissection, occlusion or flow-limiting stenosis to the skull base (V1-V3 segments). CTA HEAD FINDINGS There is a meningioma of the anterior left convexity that measures 1.3 x 1.0 cm. Second meningioma adjacent to the left cavernous sinus versus venous contrast blush. POSTERIOR CIRCULATION: --Vertebral arteries: Normal V4 segments. --Inferior cerebellar arteries: Normal. --Basilar artery: Normal. --Superior cerebellar arteries: Normal. --Posterior cerebral arteries (PCA): Normal. ANTERIOR CIRCULATION: --Intracranial internal carotid arteries: Normal. --Anterior cerebral arteries (ACA): Normal. Both A1 segments  are present. Patent anterior communicating artery (a-comm). --Middle cerebral arteries (MCA): Normal. VENOUS SINUSES: As permitted by contrast timing, patent. ANATOMIC VARIANTS: None Review of the MIP images confirms the above findings. IMPRESSION: 1. No intracranial arterial occlusion or high-grade stenosis. 2. A 1.3 x 1.0 cm meningioma of the anterior left convexity. Suspect second meningioma adjacent to the left cavernous sinus. 3. Normal CTA of the neck. Electronically Signed   By: Ulyses Jarred M.D.   On: 12/20/2019 01:53   CT ANGIO NECK W OR WO CONTRAST  Result Date: 12/20/2019 CLINICAL DATA:  Pontine infarct EXAM: CT ANGIOGRAPHY HEAD AND NECK TECHNIQUE: Multidetector CT imaging of the head and neck was performed using the standard protocol during bolus administration of intravenous contrast. Multiplanar CT image reconstructions and MIPs were obtained to evaluate the vascular anatomy. Carotid stenosis measurements (when applicable) are obtained utilizing NASCET criteria, using the distal internal carotid diameter as the denominator. CONTRAST:  71mL OMNIPAQUE IOHEXOL 350 MG/ML SOLN COMPARISON:  Brain MRI 12/19/2019 FINDINGS: CTA NECK FINDINGS SKELETON: There is no bony spinal canal stenosis. No lytic or blastic lesion. OTHER NECK: Normal pharynx, larynx and major salivary glands. No cervical lymphadenopathy. Unremarkable thyroid gland. UPPER CHEST: No pneumothorax or pleural effusion. No nodules or masses. AORTIC ARCH: There is no calcific atherosclerosis of the aortic arch. There is no aneurysm, dissection or hemodynamically significant stenosis of the visualized portion of the aorta. Conventional 3 vessel aortic branching pattern. The visualized proximal subclavian arteries are widely patent. RIGHT CAROTID SYSTEM: Normal without aneurysm, dissection or stenosis. LEFT CAROTID SYSTEM: Normal without aneurysm, dissection or stenosis. VERTEBRAL ARTERIES: Left dominant configuration. Both origins are clearly  patent. There is no dissection, occlusion or flow-limiting stenosis to the skull base (V1-V3 segments). CTA HEAD FINDINGS There is a meningioma of the anterior left convexity that measures 1.3 x 1.0 cm. Second meningioma adjacent to the left cavernous sinus versus venous contrast blush. POSTERIOR CIRCULATION: --Vertebral arteries: Normal V4 segments. --Inferior cerebellar arteries: Normal. --Basilar artery: Normal. --Superior cerebellar arteries: Normal. --Posterior cerebral arteries (PCA): Normal. ANTERIOR CIRCULATION: --Intracranial internal carotid arteries: Normal. --Anterior cerebral arteries (ACA): Normal. Both A1 segments are present. Patent anterior communicating artery (a-comm). --Middle cerebral arteries (MCA): Normal. VENOUS SINUSES: As permitted by contrast timing, patent. ANATOMIC VARIANTS: None Review of the MIP images confirms the above findings. IMPRESSION: 1. No intracranial arterial occlusion or high-grade stenosis. 2. A 1.3 x 1.0 cm meningioma of the anterior left convexity. Suspect second meningioma adjacent to the left cavernous sinus. 3. Normal CTA of the neck. Electronically Signed   By: Ulyses Jarred M.D.   On: 12/20/2019 01:53   MR BRAIN W WO CONTRAST  Result Date: 12/19/2019 CLINICAL DATA:  Stroke EXAM: MRI HEAD WITHOUT AND WITH CONTRAST TECHNIQUE: Multiplanar, multiecho pulse sequences of the brain and surrounding structures were obtained without and  with intravenous contrast. CONTRAST:  48mL GADAVIST GADOBUTROL 1 MMOL/ML IV SOLN COMPARISON:  None. FINDINGS: Brain: There is an acute infarct of the right half of pons. No mass effect. There are multiple chronic microhemorrhages but no acute hemorrhage or extra-axial collection. There is right parieto-occipital encephalomalacia with diffusely advanced atrophy and ex vacuo dilatation of the ventricles. There is severe cerebellar atrophy. There is a left frontal approach shunt catheter that terminates in the 3rd ventricle. Vascular: Major  flow voids are preserved. Skull and upper cervical spine: There are multiple old craniotomies. Sinuses/Orbits:No paranasal sinus fluid levels or advanced mucosal thickening. No mastoid or middle ear effusion. Normal orbits. IMPRESSION: 1. Acute infarct of the right pons. No hemorrhage or mass effect. 2. Severe atrophy. 3. Left frontal approach shunt catheter that terminates in the 3rd ventricle. Electronically Signed   By: Ulyses Jarred M.D.   On: 12/19/2019 19:38   DG Chest Port 1 View  Result Date: 12/19/2019 CLINICAL DATA:  Evaluate for aspiration. EXAM: PORTABLE CHEST 1 VIEW COMPARISON:  04/19/2017 FINDINGS: The heart size and mediastinal contours are within normal limits. Low lung volumes. No pleural effusion or edema. No airspace densities. The visualized skeletal structures are unremarkable. IMPRESSION: Low lung volumes. No pneumonia. Electronically Signed   By: Kerby Moors M.D.   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 EEG to evaluate for seizure Level of alertness: Awake AEDs during EEG study: LTG Technical aspects: This EEG study was done with scalp electrodes positioned according to the 10-20 International system of electrode placement. Electrical activity was acquired at a sampling rate of 500Hz  and reviewed with a high frequency filter of 70Hz  and a low frequency filter of 1Hz . EEG data were recorded continuously and digitally stored. Description: No posterior dominant rhythm was seen. EEG showed continuous generalized and maximal right posterior  quadrant 3 to 6 Hz theta-delta slowing. Hyperventilation and photic stimulation were not performed.   Parts of study were difficult to review due to significant myogenic artifact. ABNORMALITY -Continued slow, generalized and maximal right posterior quadrant IMPRESSION: This technically difficult study is suggestive of cortical dysfunction in right posterior quadrant suggestive of underlying structural abnormality, post-ictal state. There is also moderate diffuse encephalopathy, nonspecific etiology. No seizures or epileptiform discharges were seen throughout the recording. Lora Havens   CT HEAD CODE STROKE WO CONTRAST  Result Date: 12/19/2019 CLINICAL DATA:  Code stroke. Acute neuro deficit. Left arm weakness. Left facial droop. History of craniotomy for subdural hematoma. History of brain cancer. EXAM: CT HEAD WITHOUT CONTRAST TECHNIQUE: Contiguous axial images were obtained from the base of the skull through the vertex without intravenous contrast. COMPARISON:  CT head 08/23/2019 FINDINGS: Brain: Left frontal ventricular shunt passes through the third ventral into the suprasellar cistern. This is unchanged in position. No hydrocephalus. Ventricle size stable. Small low-density chronic subdural fluid collection on the left is stable. No acute subdural hemorrhage. 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 Encephalomalacia right parietal lobe is unchanged. Extensive atrophy in the cerebellum bilaterally also unchanged. 16 mm dural based mass in the left frontal lobe unchanged from prior studies compatible  with meningioma. No brain edema. Calcification in the basal ganglia bilaterally left greater than right stable. Negative for acute infarct.  Negative for acute hemorrhage. Vascular: Negative for hyperdense vessel Skull: Suboccipital craniectomy on the left. Large right hemispheric craniotomy. Sinuses/Orbits: Mild mucosal edema paranasal sinuses. Normal orbit bilaterally. Other: None  ASPECTS ([place] Stroke Program Early CT Score) - Ganglionic level infarction (caudate, lentiform nuclei, internal capsule, insula, M1-M3 cortex): 7 - Supraganglionic infarction (M4-M6 cortex): 3 Total score (0-10 with 10 being normal): 10 IMPRESSION: 1. No acute abnormality and no change from the prior CT. 2. ASPECTS is 10 3. Extensive postsurgical changes with right craniotomy and left suboccipital craniectomy. 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 4. 16 mm left frontal meningioma unchanged. 5. Code stroke imaging results were communicated on 12/19/2019 at 2:51 pm to provider Rory Percy via [place] Electronically Signed   By: Franchot Gallo M.D.   On: 12/19/2019 14:54    PHYSICAL EXAM  Temp:  [98 F (36.7 C)-98.6 F (37 C)] 98 F (36.7 C) (12/03 1531) Pulse Rate:  [69-92] 83 (12/03 1531) Resp:  [15-25] 17 (12/03 1531) BP: (99-131)/(56-97) 116/70 (12/03 1531) SpO2:  [90 %-99 %] 98 % (12/03 1531) Weight:  [86 kg] 86 kg (12/03 0445)  General -cachectic looking, well developed, in no apparent distress.  Ophthalmologic - fundi not visualized due to noncooperation.  Cardiovascular - Regular rhythm and rate.  Neuro - eyes open, awake, not alert, nonverbal not follow commands.  Skull defect due to previous surgeries.  Gaze midline, able to have mild bilateral gaze, however far from complete.  Right gaze with nystagmus.  Inconsistently blinking to visual threat bilaterally.  Left facial weakness, tongue protrusion not cooperative.  Right upper extremity no drift, left upper extremity barely against gravity and quickly fall to the bed.  Bilateral lower extremities mild withdraw to pain 2+/5, seems symmetrical.   ASSESSMENT/PLAN Mr. JENSEN KILBURG is a 49 y.o. 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 Not a candidate for acute intervention.  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 ASPECTS 10. Extensive post OR changes from R and L crani. Small chronic L SDH. 37mm L frontal meningioma stable. Suspect 2nd meningioma adjacent to L cavernous sinus.   MRI  R pontine infarct. L frontal shunt ends in 3rd ventricle.   CTA head & neck vessels Unremarkable   2D Echo pending  LDL 62  HgbA1c 5.4  VTE prophylaxis - SCDs  No antithrombotic prior to admission, now on aspirin 300 mg suppository daily given no po access.   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 Nothing witnessed en route or in hospital,  EEG continued slow, generalized and maximal R posterior quadrant  Continue lamictal IP    Hyperlipidemia  Home meds:  lipitor 20, resumed in hospital  LDL 62, goal < 70  Consider to resume statin once po access  Continue statin at discharge  Dysphagia . Secondary to stroke . NPO . Did not pass swallow so far  . Speech on board   Other Stroke Risk Factors  Hx stroke/TIA  08/2013 - MRI at [facility] w/ interval R crani w/ new  masslikeT2 hyperintensity R posterior parieto-occipital area just following crani for encphalitis at time of cerebritis dx.   11/20115 - MRI at [facility] showed chronic R temporoparietal infarct   Other Active Problems  Hx 4th ventricle medulla blastoma, s/p OR in 1995 followed by XRT w/ VP shunt   Hx L sided meningioma s/p resection in 2004  10/2019 w/ hallucinations and R leg weakness. 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 Please call with questions. Pt will follow up with NP Slack at [facility] on 03/02/20. Thanks for the consult.  Rosalin Hawking, MD PhD Stroke Neurology 12/20/2019 6:19 PM    To contact Stroke Continuity provider, please refer to [email or web address] After hours, contact General Neurology

## 2019-12-20 NOTE — CV Procedure (Signed)
2nd attempt for 2D echo and patient was still in chair after nurse was notified.Will try again later.

## 2019-12-20 NOTE — Progress Notes (Signed)
Modified Barium Swallow Progress Note  Patient Details  Name: Glen [name] MRN: [id number] Date of Birth: 16-Jul-1970  Today's Date: 12/20/2019  Modified Barium Swallow completed.  Full report located under Chart Review in the Imaging Section.  Brief recommendations include the following:  Clinical Impression  weakness, but also right labial weakness exacerbated by rightward lean. Pt has anterior spillage and slow, groping oral manipulation of bolus with pooling buccal cavities, oral holdig with verbal reminders needed to initiate transit, particularly with purees and premature spillage causing instances of silent aspiration with thin liquids. Pt is totally unable to masticate and only rolls soft soldis around his mouth. He will be at high risk of aspirating unmasticated solids if not on a pureed diet. Increased bolus size and decreased viscosity all increase penetrationa nd aspriation events. Pt is able to tolerate teaspoons and small cup sips of nectar thick liquids, though he will likely have some trace aspiration events of residue spilling to cords after sips. Recommend a puree diet and nectar thick liquids. Will f/u for family education   Swallow Evaluation Recommendations       SLP Diet Recommendations: Dysphagia 1 (Puree) solids;Thin liquid   Liquid Administration via: Cup;Spoon   Medication Administration: Crushed with puree   Supervision: Staff to assist with self feeding   Compensations: Slow rate;Small sips/bites               Herbie Baltimore, MA CCC-SLP  Acute Rehabilitation Services Pager [phone] Office [phone]  Lynann Beaver 12/20/2019,3:09 PM

## 2019-12-20 NOTE — Hospital Course (Signed)
Glen [name] is a 49 y.o. male with medical history significant of fourth ventricle medulla blastoma, status post surgery in 1995, left sided meningioma status post resection in 2004, seizure disorder, chronic ambulatory dysfunction on roller walker, hypothyroidism who presented with new onset of left-sided weakness, difficulty swallowing and altered mentation.  He was unable to provide any meaningful information on admission.  It was reported that patient's mom had noticed the patient was becoming more tremulous and decreased movement in his hands with associated dropping utensils when eating.  After an acute episode of choking on food at home and becoming unresponsive, he was brought to the ER for further evaluation. There was concern for possible stroke and patient underwent work-up.  MRI brain showed an acute infarct involving the right pons.  There was also underlying severe atrophy.

## 2019-12-21 ENCOUNTER — Inpatient Hospital Stay (HOSPITAL_COMMUNITY): Payer: Medicare HMO

## 2019-12-21 DIAGNOSIS — Z515 Encounter for palliative care: Secondary | ICD-10-CM | POA: Diagnosis not present

## 2019-12-21 DIAGNOSIS — I6389 Other cerebral infarction: Secondary | ICD-10-CM

## 2019-12-21 DIAGNOSIS — Z7189 Other specified counseling: Secondary | ICD-10-CM

## 2019-12-21 DIAGNOSIS — I6302 Cerebral infarction due to thrombosis of basilar artery: Secondary | ICD-10-CM | POA: Diagnosis not present

## 2019-12-21 DIAGNOSIS — Z66 Do not resuscitate: Secondary | ICD-10-CM

## 2019-12-21 LAB — CBC WITH DIFFERENTIAL/PLATELET
Abs Immature Granulocytes: 0.06 10*3/uL (ref 0.00–0.07)
Basophils Absolute: 0 10*3/uL (ref 0.0–0.1)
Basophils Relative: 0 %
Eosinophils Absolute: 0.2 10*3/uL (ref 0.0–0.5)
Eosinophils Relative: 1 %
HCT: 37.8 % — ABNORMAL LOW (ref 39.0–52.0)
Hemoglobin: 13.1 g/dL (ref 13.0–17.0)
Immature Granulocytes: 1 %
Lymphocytes Relative: 18 %
Lymphs Abs: 1.8 10*3/uL (ref 0.7–4.0)
MCH: 29.7 pg (ref 26.0–34.0)
MCHC: 34.7 g/dL (ref 30.0–36.0)
MCV: 85.7 fL (ref 80.0–100.0)
Monocytes Absolute: 0.8 10*3/uL (ref 0.1–1.0)
Monocytes Relative: 8 %
Neutro Abs: 7.5 10*3/uL (ref 1.7–7.7)
Neutrophils Relative %: 72 %
Platelets: 213 10*3/uL (ref 150–400)
RBC: 4.41 MIL/uL (ref 4.22–5.81)
RDW: 12.7 % (ref 11.5–15.5)
WBC: 10.4 10*3/uL (ref 4.0–10.5)
nRBC: 0 % (ref 0.0–0.2)

## 2019-12-21 LAB — BASIC METABOLIC PANEL
Anion gap: 12 (ref 5–15)
BUN: 8 mg/dL (ref 6–20)
CO2: 26 mmol/L (ref 22–32)
Calcium: 9.4 mg/dL (ref 8.9–10.3)
Chloride: 100 mmol/L (ref 98–111)
Creatinine, Ser: 1.04 mg/dL (ref 0.61–1.24)
GFR, Estimated: >60 mL/min (ref >60)
Glucose, Bld: 87 mg/dL (ref 70–99)
Potassium: 3.7 mmol/L (ref 3.5–5.1)
Sodium: 138 mmol/L (ref 135–145)

## 2019-12-21 LAB — ECHOCARDIOGRAM COMPLETE
Area-P 1/2: 2.99 cm2
Height: 74 in
S' Lateral: 3.5 cm
Weight: 3033.53 oz

## 2019-12-21 LAB — MAGNESIUM: Magnesium: 1.7 mg/dL (ref 1.7–2.4)

## 2019-12-21 MED ORDER — ASPIRIN 325 MG PO TABS
325.0000 mg | ORAL_TABLET | Freq: Every day | ORAL | Status: DC
  Filled 2019-12-21: qty 1

## 2019-12-21 MED ORDER — PERFLUTREN LIPID MICROSPHERE
1.0000 mL | INTRAVENOUS | Status: AC | PRN
  Administered 2019-12-21: 1.5 mL via INTRAVENOUS
  Filled 2019-12-21: qty 10

## 2019-12-21 MED ORDER — ASPIRIN 300 MG RE SUPP
300.0000 mg | Freq: Every day | RECTAL | Status: DC
  Administered 2019-12-21 – 2019-12-22 (×2): 300 mg via RECTAL
  Filled 2019-12-21 (×2): qty 1

## 2019-12-21 NOTE — Progress Notes (Signed)
Inpatient Rehab Admissions:  Inpatient Rehab Consult received.  I met with patient at the bedside to attempt a rehabilitation assessment and to discuss goals and expectations of an inpatient rehab admission. Pt opened eyes but did not verbalize when [facility] said name.  AC called pt's mom, Pamala Hurry to discuss CIR recommendation, goals and expectations. She acknowledged understanding of goals and expectations. Pamala Hurry interested in pursuing CIR for pt.  Awaiting OT notes/discharge recommendation.  Will continue to follow.  Signed: Gayland Curry, [place], [place] Admissions Coordinator [phone]

## 2019-12-21 NOTE — Progress Notes (Signed)
  Speech Language Pathology Treatment: Dysphagia  Patient Details Name: Glen [name] MRN: [id number] DOB: [birth year] Today's Date: 12/21/2019 Time: 0034-9179 SLP Time Calculation (min) (ACUTE ONLY): 23 min  Assessment / Plan / Recommendation Clinical Impression  Pt given puree/Nectar-thickened liquids with moderate cueing for swallow to increase speed during consumption; pt with slow initiation/propulsion and awareness, delayed onset of swallow and audible swallow throughout trial of puree/nectar-thickened liquids.  No coughing/throat clearing noted and pt answered yes/no questions in regard to more bites/sips during consumption.  He attempted to communicate verbally with SLP with limited intelligibility noted.  Continue current diet of puree/nectar-thickened liquids with ST f/u while in acute setting for safety with swallowing/intake.  HPI HPI: Glen [name] is a 49 y.o. male with medical history significant of fourth ventricle medulla blastoma, status post surgery in 1995, left sided meningioma status post resection in 2004, seizure disorder, chronic ambulatory dysfunction on roller walker, hypothyroidism, presented with new onset of left-sided weakness, choking on food and overall mental status.  MRI shows Acute infarct of the right pons. Mother lives with pt and reported that while eating lunch, patient suddenly choked on his food, having strenuous coughing and became unresponsive, head leaning toward right side and unable to move left-sided arm or leg.  Mother also reported that for the last week, patient has had trouble eating his food with frequent cough after swallowing. in 2019 he was admitted to CIR after SDH with craniectomy and was discharged on a puree diet due to perseverative chewing had severe cognitive impairment. At baseline he is only able to speak a few words.       SLP Plan  Continue with current plan of care       Recommendations  Diet recommendations: Dysphagia 1  (puree);Nectar-thick liquid Liquids provided via: Teaspoon Medication Administration: Crushed with puree Supervision: Trained caregiver to feed patient Compensations: Slow rate;Small sips/bites;Other (Comment) (wait for pt to swallow before feeding more spoonfuls) Postural Changes and/or Swallow Maneuvers: Seated upright 90 degrees                Oral Care Recommendations: Oral care BID Follow up Recommendations: 24 hour supervision/assistance SLP Visit Diagnosis: Dysphagia, oropharyngeal phase (R13.12) Plan: Continue with current plan of care                       Elvina Sidle, M.S., CCC-SLP 12/21/2019, 5:09 PM

## 2019-12-21 NOTE — Progress Notes (Signed)
PROGRESS NOTE    LOUISE VICTORY   JJO:[id number]  DOB: [birth year]  DOA: 12/19/2019     2  PCP: Rochel Brome, MD  CC: weakness, AMS  Hospital Course: IKEEM CLECKLER is a 49 y.o. male with medical history significant of fourth ventricle medulla blastoma, status post surgery in 1995, left sided meningioma status post resection in 2004, seizure disorder, chronic ambulatory dysfunction on roller walker, hypothyroidism who presented with new onset of left-sided weakness, difficulty swallowing and altered mentation.  He was unable to provide any meaningful information on admission.  It was reported that patient's mom had noticed the patient was becoming more tremulous and decreased movement in his hands with associated dropping utensils when eating.  After an acute episode of choking on food at home and becoming unresponsive, he was brought to the ER for further evaluation. There was concern for possible stroke and patient underwent work-up.  MRI brain showed an acute infarct involving the right pons.  There was also underlying severe atrophy.    Interval History:  Laying in bed and unable to really interact with me at all.  He cannot follow commands, speak, and has not been eating for staff either.  Old records reviewed in assessment of this patient  ROS: Review of systems not obtained due to patient factors. 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  Patient had left-sided weakness, dysarthria, aphasia -Continue aspirin 325 mg daily; initially failed swallow eval, then passed for dysphagia level one but is still not actually eating (refusing food per nursing, this is not intentional, he just simply does not seem to understand); changing aspirin back to suppository - to me his deficits appear rather significant; for 2 days now he has laid in bed noninteractive; does not follow commands or comprehend them (also requires max cues from therapy). He  is also now not eating and even his swallow eval was not the best and he is high aspiration risk - I do not feel CIR would benefit patient; with his worsening/guarded prognosis he is more a candidate for beginning palliative care discussions at this time especially if not going to eat (PEG is not a good idea especially as this would not change aspiration risk) - continue with PT/OT for now, but needs palliative care to discuss with family regarding GOC at this point  Seizure disorder -Continue Lamictal (if and when patient can take PO). Await PCM consult prior to changing much to IV  Hypothyroidism -Continue Synthroid  Hypokalemia -Replete and recheck as needed  History of brain tumor, involving meningioma and medulloblastoma -No acute issue  Hx of cerebritis -Etiology with one of the differentials neurology concern about.  But given the acute MRI findings of stroke, cerebritis is unlikely. -Patient has been following with outpatient neurologist, MRI done on October 25 showed no acute finding.  Antimicrobials: None  DVT prophylaxis: Arixtra, history of HIT Code Status: Full Family Communication: None present Disposition Plan: Status is: Inpatient  Remains inpatient appropriate because:Altered mental status, IV treatments appropriate due to intensity of illness or inability to take PO and Inpatient level of care appropriate due to severity of illness   Dispo: The patient is from: Home              Anticipated d/c is to: SNF vs residential hospice if continues to decline               Anticipated d/c date is: > 3  days              Patient currently is not medically stable to d/c.  Objective: Blood pressure 106/67, pulse 67, temperature 97.9 F (36.6 C), temperature source Axillary, resp. rate 18, height 6\' 2"  (1.88 m), weight 86 kg, SpO2 91 %.  Examination: General appearance: Chronically ill-appearing adult man appearing much older than actual age lying in bed in no  distress and unable to interact or follow commands Head: Chronic skull deformities noted Eyes: EOMI Lungs: clear to auscultation bilaterally Heart: regular rate and rhythm and S1, S2 normal Abdomen: normal findings: bowel sounds normal and soft, non-tender Extremities: No edema Skin: warm, dry, intact Neurologic: Does not follow commands.  Left upper extremity 1/5 strength.  Remains nonverbal  Consultants:   Neurology  Procedures:     Data Reviewed: I have personally reviewed following labs and imaging studies Results for orders placed or performed during the hospital encounter of 12/19/19 (from the past 24 hour(s))  Basic metabolic panel     Status: None   Collection Time: 12/21/19  1:56 AM  Result Value Ref Range   Sodium 138 135 - 145 mmol/L   Potassium 3.7 3.5 - 5.1 mmol/L   Chloride 100 98 - 111 mmol/L   CO2 26 22 - 32 mmol/L   Glucose, Bld 87 70 - 99 mg/dL   BUN 8 6 - 20 mg/dL   Creatinine, Ser 1.04 0.61 - 1.24 mg/dL   Calcium 9.4 8.9 - 10.3 mg/dL   GFR, Estimated >60 >60 mL/min   Anion gap 12 5 - 15  CBC with Differential/Platelet     Status: Abnormal   Collection Time: 12/21/19  1:56 AM  Result Value Ref Range   WBC 10.4 4.0 - 10.5 K/uL   RBC 4.41 4.22 - 5.81 MIL/uL   Hemoglobin 13.1 13.0 - 17.0 g/dL   HCT 37.8 (L) 39 - 52 %   MCV 85.7 80.0 - 100.0 fL   MCH 29.7 26.0 - 34.0 pg   MCHC 34.7 30.0 - 36.0 g/dL   RDW 12.7 11.5 - 15.5 %   Platelets 213 150 - 400 K/uL   nRBC 0.0 0.0 - 0.2 %   Neutrophils Relative % 72 %   Neutro Abs 7.5 1.7 - 7.7 K/uL   Lymphocytes Relative 18 %   Lymphs Abs 1.8 0.7 - 4.0 K/uL   Monocytes Relative 8 %   Monocytes Absolute 0.8 0.1 - 1.0 K/uL   Eosinophils Relative 1 %   Eosinophils Absolute 0.2 0.0 - 0.5 K/uL   Basophils Relative 0 %   Basophils Absolute 0.0 0.0 - 0.1 K/uL   Immature Granulocytes 1 %   Abs Immature Granulocytes 0.06 0.00 - 0.07 K/uL  Magnesium     Status: None   Collection Time: 12/21/19  1:56 AM  Result  Value Ref Range   Magnesium 1.7 1.7 - 2.4 mg/dL    Recent Results (from the past 240 hour(s))  Urine culture     Status: Abnormal   Collection Time: 12/19/19  4:31 PM   Specimen: Urine, Random  Result Value Ref Range Status   Specimen Description URINE, RANDOM  Final   Special Requests NONE  Final   Culture (A)  Final    <10,000 COLONIES/mL INSIGNIFICANT GROWTH Performed at [facility], [address] [place], [place] [address]    Report Status 12/20/2019 FINAL  Final  Resp Panel by RT-PCR (Flu A&B, Covid) Nasopharyngeal Swab  Status: None   Collection Time: 12/19/19  4:31 PM   Specimen: Nasopharyngeal Swab; Nasopharyngeal(NP) swabs in vial transport medium  Result Value Ref Range Status   SARS Coronavirus 2 by RT PCR NEGATIVE NEGATIVE Final    Comment: (NOTE) SARS-CoV-2 target nucleic acids are NOT DETECTED.  The SARS-CoV-2 RNA is generally detectable in upper respiratory specimens during the acute phase of infection. The lowest concentration of SARS-CoV-2 viral copies this assay can detect is 138 copies/mL. A negative result does not preclude SARS-Cov-2 infection and should not be used as the sole basis for treatment or other patient management decisions. A negative result may occur with  improper specimen collection/handling, submission of specimen other than nasopharyngeal swab, presence of viral mutation(s) within the areas targeted by this assay, and inadequate number of viral copies(<138 copies/mL). A negative result must be combined with clinical observations, patient history, and epidemiological information. The expected result is Negative.  Fact Sheet for Patients:  EntrepreneurPulse.com.au  Fact Sheet for Healthcare Providers:  IncredibleEmployment.be  This test is no t yet approved or cleared by the [place] FDA and  has been authorized for detection and/or diagnosis of SARS-CoV-2 by FDA under an Emergency Use  Authorization (EUA). This EUA will remain  in effect (meaning this test can be used) for the duration of the COVID-19 declaration under Section 564(b)(1) of the Act, 21 U.S.C.section 360bbb-3(b)(1), unless the authorization is terminated  or revoked sooner.       Influenza A by PCR NEGATIVE NEGATIVE Final   Influenza B by PCR NEGATIVE NEGATIVE Final    Comment: (NOTE) The Xpert Xpress SARS-CoV-2/FLU/RSV plus assay is intended as an aid in the diagnosis of influenza from Nasopharyngeal swab specimens and should not be used as a sole basis for treatment. Nasal washings and aspirates are unacceptable for Xpert Xpress SARS-CoV-2/FLU/RSV testing.  Fact Sheet for Patients: EntrepreneurPulse.com.au  Fact Sheet for Healthcare Providers: IncredibleEmployment.be  This test is not yet approved or cleared by the [place] FDA and has been authorized for detection and/or diagnosis of SARS-CoV-2 by FDA under an Emergency Use Authorization (EUA). This EUA will remain in effect (meaning this test can be used) for the duration of the COVID-19 declaration under Section 564(b)(1) of the Act, 21 U.S.C. section 360bbb-3(b)(1), unless the authorization is terminated or revoked.  Performed at [facility], [place] [address], [place], [place] [address]      Radiology Studies: CT ANGIO HEAD W OR WO CONTRAST  Result Date: 12/20/2019 CLINICAL DATA:  Pontine infarct EXAM: CT ANGIOGRAPHY HEAD AND NECK TECHNIQUE: Multidetector CT imaging of the head and neck was performed using the standard protocol during bolus administration of intravenous contrast. Multiplanar CT image reconstructions and MIPs were obtained to evaluate the vascular anatomy. Carotid stenosis measurements (when applicable) are obtained utilizing NASCET criteria, using the distal internal carotid diameter as the denominator. CONTRAST:  24mL OMNIPAQUE IOHEXOL 350 MG/ML SOLN COMPARISON:  Brain MRI  12/19/2019 FINDINGS: CTA NECK FINDINGS SKELETON: There is no bony spinal canal stenosis. No lytic or blastic lesion. OTHER NECK: Normal pharynx, larynx and major salivary glands. No cervical lymphadenopathy. Unremarkable thyroid gland. UPPER CHEST: No pneumothorax or pleural effusion. No nodules or masses. AORTIC ARCH: There is no calcific atherosclerosis of the aortic arch. There is no aneurysm, dissection or hemodynamically significant stenosis of the visualized portion of the aorta. Conventional 3 vessel aortic branching pattern. The visualized proximal subclavian arteries are widely patent. RIGHT CAROTID SYSTEM: Normal without aneurysm, dissection or stenosis.  LEFT CAROTID SYSTEM: Normal without aneurysm, dissection or stenosis. VERTEBRAL ARTERIES: Left dominant configuration. Both origins are clearly patent. There is no dissection, occlusion or flow-limiting stenosis to the skull base (V1-V3 segments). CTA HEAD FINDINGS There is a meningioma of the anterior left convexity that measures 1.3 x 1.0 cm. Second meningioma adjacent to the left cavernous sinus versus venous contrast blush. POSTERIOR CIRCULATION: --Vertebral arteries: Normal V4 segments. --Inferior cerebellar arteries: Normal. --Basilar artery: Normal. --Superior cerebellar arteries: Normal. --Posterior cerebral arteries (PCA): Normal. ANTERIOR CIRCULATION: --Intracranial internal carotid arteries: Normal. --Anterior cerebral arteries (ACA): Normal. Both A1 segments are present. Patent anterior communicating artery (a-comm). --Middle cerebral arteries (MCA): Normal. VENOUS SINUSES: As permitted by contrast timing, patent. ANATOMIC VARIANTS: None Review of the MIP images confirms the above findings. IMPRESSION: 1. No intracranial arterial occlusion or high-grade stenosis. 2. A 1.3 x 1.0 cm meningioma of the anterior left convexity. Suspect second meningioma adjacent to the left cavernous sinus. 3. Normal CTA of the neck. Electronically Signed   By:  Ulyses Jarred M.D.   On: 12/20/2019 01:53   CT ANGIO NECK W OR WO CONTRAST  Result Date: 12/20/2019 CLINICAL DATA:  Pontine infarct EXAM: CT ANGIOGRAPHY HEAD AND NECK TECHNIQUE: Multidetector CT imaging of the head and neck was performed using the standard protocol during bolus administration of intravenous contrast. Multiplanar CT image reconstructions and MIPs were obtained to evaluate the vascular anatomy. Carotid stenosis measurements (when applicable) are obtained utilizing NASCET criteria, using the distal internal carotid diameter as the denominator. CONTRAST:  67mL OMNIPAQUE IOHEXOL 350 MG/ML SOLN COMPARISON:  Brain MRI 12/19/2019 FINDINGS: CTA NECK FINDINGS SKELETON: There is no bony spinal canal stenosis. No lytic or blastic lesion. OTHER NECK: Normal pharynx, larynx and major salivary glands. No cervical lymphadenopathy. Unremarkable thyroid gland. UPPER CHEST: No pneumothorax or pleural effusion. No nodules or masses. AORTIC ARCH: There is no calcific atherosclerosis of the aortic arch. There is no aneurysm, dissection or hemodynamically significant stenosis of the visualized portion of the aorta. Conventional 3 vessel aortic branching pattern. The visualized proximal subclavian arteries are widely patent. RIGHT CAROTID SYSTEM: Normal without aneurysm, dissection or stenosis. LEFT CAROTID SYSTEM: Normal without aneurysm, dissection or stenosis. VERTEBRAL ARTERIES: Left dominant configuration. Both origins are clearly patent. There is no dissection, occlusion or flow-limiting stenosis to the skull base (V1-V3 segments). CTA HEAD FINDINGS There is a meningioma of the anterior left convexity that measures 1.3 x 1.0 cm. Second meningioma adjacent to the left cavernous sinus versus venous contrast blush. POSTERIOR CIRCULATION: --Vertebral arteries: Normal V4 segments. --Inferior cerebellar arteries: Normal. --Basilar artery: Normal. --Superior cerebellar arteries: Normal. --Posterior cerebral arteries  (PCA): Normal. ANTERIOR CIRCULATION: --Intracranial internal carotid arteries: Normal. --Anterior cerebral arteries (ACA): Normal. Both A1 segments are present. Patent anterior communicating artery (a-comm). --Middle cerebral arteries (MCA): Normal. VENOUS SINUSES: As permitted by contrast timing, patent. ANATOMIC VARIANTS: None Review of the MIP images confirms the above findings. IMPRESSION: 1. No intracranial arterial occlusion or high-grade stenosis. 2. A 1.3 x 1.0 cm meningioma of the anterior left convexity. Suspect second meningioma adjacent to the left cavernous sinus. 3. Normal CTA of the neck. Electronically Signed   By: Ulyses Jarred M.D.   On: 12/20/2019 01:53   MR BRAIN W WO CONTRAST  Result Date: 12/19/2019 CLINICAL DATA:  Stroke EXAM: MRI HEAD WITHOUT AND WITH CONTRAST TECHNIQUE: Multiplanar, multiecho pulse sequences of the brain and surrounding structures were obtained without and with intravenous contrast. CONTRAST:  4mL GADAVIST GADOBUTROL 1 MMOL/ML IV SOLN  COMPARISON:  None. FINDINGS: Brain: There is an acute infarct of the right half of pons. No mass effect. There are multiple chronic microhemorrhages but no acute hemorrhage or extra-axial collection. There is right parieto-occipital encephalomalacia with diffusely advanced atrophy and ex vacuo dilatation of the ventricles. There is severe cerebellar atrophy. There is a left frontal approach shunt catheter that terminates in the 3rd ventricle. Vascular: Major flow voids are preserved. Skull and upper cervical spine: There are multiple old craniotomies. Sinuses/Orbits:No paranasal sinus fluid levels or advanced mucosal thickening. No mastoid or middle ear effusion. Normal orbits. IMPRESSION: 1. Acute infarct of the right pons. No hemorrhage or mass effect. 2. Severe atrophy. 3. Left frontal approach shunt catheter that terminates in the 3rd ventricle. Electronically Signed   By: Ulyses Jarred M.D.   On: 12/19/2019 19:38   DG Chest Port 1  View  Result Date: 12/19/2019 CLINICAL DATA:  Evaluate for aspiration. EXAM: PORTABLE CHEST 1 VIEW COMPARISON:  04/19/2017 FINDINGS: The heart size and mediastinal contours are within normal limits. Low lung volumes. No pleural effusion or edema. No airspace densities. The visualized skeletal structures are unremarkable. IMPRESSION: Low lung volumes. No pneumonia. Electronically Signed   By: Kerby Moors M.D.   On: 12/19/2019 20:10   DG Swallowing Func-Speech Pathology  Result Date: 12/20/2019 Objective Swallowing Evaluation: Type of Study: MBS-Modified Barium Swallow Study  Patient Details Name: BLONG BUSK MRN: [id number] Date of Birth: October 25, 1970 Today's Date: 12/20/2019 Time: SLP Start Time (ACUTE ONLY): 1330 -SLP Stop Time (ACUTE ONLY): 1349 SLP Time Calculation (min) (ACUTE ONLY): 19 min Past Medical History: Past Medical History: Diagnosis Date . Brain cancer ([place])  . Hiccough  . Hypercholesteremia  . Other elevated white blood cell count  . Other encephalitis and encephalomyelitis  . Posterior reversible encephalopathy syndrome  . Seizure ([place])  . Stroke ([place])  . Testicular hypofunction  . Thyroid disorder  Past Surgical History: Past Surgical History: Procedure Laterality Date . APPENDECTOMY    1980s . BRAIN SURGERY    1993 . CRANIOTOMY Right 04/09/2017  Procedure: CRANIOTOMY HEMATOMA EVACUATION SUBDURAL;  Surgeon: Erline Levine, MD;  Location: [facility];  Service: Neurosurgery;  Laterality: Right; . DIRECT LARYNGOSCOPY N/A 04/09/2017  Procedure: DIRECT LARYNGOSCOPY;  Surgeon: Erline Levine, MD;  Location: [facility];  Service: Neurosurgery;  Laterality: N/A; . DIRECT LARYNGOSCOPY N/A 04/09/2017  Procedure: DIRECT LARYNGOSCOPY;  Surgeon: [name], MD;  Location: [facility];  Service: ENT;  Laterality: N/A; . DIRECT LARYNGOSCOPY N/A 04/18/2017  Procedure: DIRECT LARYNGOSCOPY;  Surgeon: [name], MD;  Location: [facility];  Service: ENT;  Laterality: N/A; . EYE SURGERY    X5 . TRACHEOSTOMY TUBE PLACEMENT N/A  04/18/2017  Procedure: TRANSNASAL FIBER OPTIC LARYNGOSCOPY;  Surgeon: [name], MD;  Location: [facility];  Service: ENT;  Laterality: N/A; HPI: CORWIN KUIKEN is a 49 y.o. male with medical history significant of fourth ventricle medulla blastoma, status post surgery in 1995, left sided meningioma status post resection in 2004, seizure disorder, chronic ambulatory dysfunction on roller walker, hypothyroidism, presented with new onset of left-sided weakness, choking on food and overall mental status.  MRI shows Acute infarct of the right pons. Mother lives with pt and reported that while eating lunch, patient suddenly choked on his food, having strenuous coughing and became unresponsive, head leaning toward right side and unable to move left-sided arm or leg.  Mother also reported that for the last week, patient has had trouble eating his food with frequent cough after swallowing.  in 2019 he was admitted to CIR after SDH with craniectomy and was discharged on a puree diet due to perseverative chewing had severe cognitive impairment. At baseline he is only able to speak a few words.  No data recorded Assessment / Plan / Recommendation CHL IP CLINICAL IMPRESSIONS 12/20/2019 Clinical Impression weakness, but also right labial weakness exacerbated by rightward lean. Pt has anterior spillage and slow, groping oral manipulation of bolus with pooling buccal cavities, oral holdig with verbal reminders needed to initiate transit, particularly with purees and premature spillage causing instances of silent aspiration with thin liquids. Pt is totally unable to masticate and only rolls soft soldis around his mouth. He will be at high risk of aspirating unmasticated solids if not on a pureed diet. Increased bolus size and decreased viscosity all increase penetrationa nd aspriation events. Pt is able to tolerate teaspoons and small cup sips of nectar thick liquids, though he will likely have some trace aspiration events of residue  spilling to cords after sips. Recommend a puree diet and nectar thick liquids. Will f/u for family education SLP Visit Diagnosis Dysphagia, oropharyngeal phase (R13.12) Attention and concentration deficit following -- Frontal lobe and executive function deficit following -- Impact on safety and function Severe aspiration risk;Moderate aspiration risk   CHL IP TREATMENT RECOMMENDATION 12/20/2019 Treatment Recommendations Therapy as outlined in treatment plan below   No flowsheet data found. CHL IP DIET RECOMMENDATION 12/20/2019 SLP Diet Recommendations Dysphagia 1 (Puree) solids;Thin liquid Liquid Administration via Cup;Spoon Medication Administration Crushed with puree Compensations Slow rate;Small sips/bites Postural Changes --   CHL IP OTHER RECOMMENDATIONS 04/19/2017 Recommended Consults -- Oral Care Recommendations Oral care BID Other Recommendations --   CHL IP FOLLOW UP RECOMMENDATIONS 12/20/2019 Follow up Recommendations 24 hour supervision/assistance   CHL IP FREQUENCY AND DURATION 12/20/2019 Speech Therapy Frequency (ACUTE ONLY) min 2x/week Treatment Duration --      CHL IP ORAL PHASE 12/20/2019 Oral Phase Impaired Oral - Pudding Teaspoon -- Oral - Pudding Cup -- Oral - Honey Teaspoon -- Oral - Honey Cup -- Oral - Nectar Teaspoon Right anterior bolus loss;Weak lingual manipulation;Reduced posterior propulsion;Right pocketing in lateral sulci;Lingual/palatal residue;Left pocketing in lateral sulci;Pocketing in anterior sulcus;Delayed oral transit;Decreased bolus cohesion;Premature spillage Oral - Nectar Cup Right anterior bolus loss;Weak lingual manipulation;Reduced posterior propulsion;Right pocketing in lateral sulci;Lingual/palatal residue;Left pocketing in lateral sulci;Pocketing in anterior sulcus;Delayed oral transit;Decreased bolus cohesion;Premature spillage Oral - Nectar Straw Right anterior bolus loss;Weak lingual manipulation;Reduced posterior propulsion;Right pocketing in lateral sulci;Lingual/palatal  residue;Left pocketing in lateral sulci;Pocketing in anterior sulcus;Delayed oral transit;Decreased bolus cohesion;Premature spillage Oral - Thin Teaspoon Right anterior bolus loss;Weak lingual manipulation;Reduced posterior propulsion;Right pocketing in lateral sulci;Lingual/palatal residue;Left pocketing in lateral sulci;Pocketing in anterior sulcus;Delayed oral transit;Decreased bolus cohesion;Premature spillage Oral - Thin Cup -- Oral - Thin Straw Right anterior bolus loss;Weak lingual manipulation;Reduced posterior propulsion;Right pocketing in lateral sulci;Lingual/palatal residue;Left pocketing in lateral sulci;Pocketing in anterior sulcus;Delayed oral transit;Decreased bolus cohesion;Premature spillage;Holding of bolus Oral - Puree Right anterior bolus loss;Weak lingual manipulation;Reduced posterior propulsion;Right pocketing in lateral sulci;Lingual/palatal residue;Left pocketing in lateral sulci;Pocketing in anterior sulcus;Delayed oral transit;Decreased bolus cohesion;Premature spillage;Holding of bolus Oral - Mech Soft Right anterior bolus loss;Weak lingual manipulation;Reduced posterior propulsion;Right pocketing in lateral sulci;Lingual/palatal residue;Left pocketing in lateral sulci;Pocketing in anterior sulcus;Delayed oral transit;Decreased bolus cohesion;Holding of bolus Oral - Regular -- Oral - Multi-Consistency -- Oral - Pill -- Oral Phase - Comment --  CHL IP PHARYNGEAL PHASE 12/20/2019 Pharyngeal Phase Impaired Pharyngeal- Pudding Teaspoon -- Pharyngeal -- Pharyngeal- Pudding  Cup -- Pharyngeal -- Pharyngeal- Honey Teaspoon -- Pharyngeal -- Pharyngeal- Honey Cup -- Pharyngeal -- Pharyngeal- Nectar Teaspoon Pharyngeal residue - valleculae;Pharyngeal residue - pyriform;Reduced tongue base retraction Pharyngeal -- Pharyngeal- Nectar Cup Reduced tongue base retraction;Penetration/Aspiration during swallow;Penetration/Apiration after swallow;Pharyngeal residue - valleculae;Pharyngeal residue -  pyriform Pharyngeal Material enters airway, remains ABOVE vocal cords and not ejected out Pharyngeal- Nectar Straw Reduced tongue base retraction;Pharyngeal residue - valleculae;Pharyngeal residue - pyriform Pharyngeal -- Pharyngeal- Thin Teaspoon -- Pharyngeal -- Pharyngeal- Thin Cup -- Pharyngeal -- Pharyngeal- Thin Straw -- Pharyngeal -- Pharyngeal- Puree Pharyngeal residue - valleculae;Pharyngeal residue - pyriform;Reduced tongue base retraction Pharyngeal -- Pharyngeal- Mechanical Soft Pharyngeal residue - valleculae;Pharyngeal residue - pyriform;Reduced tongue base retraction Pharyngeal -- Pharyngeal- Regular -- Pharyngeal -- Pharyngeal- Multi-consistency -- Pharyngeal -- Pharyngeal- Pill -- Pharyngeal -- Pharyngeal Comment --  No flowsheet data found. 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 EEG to evaluate for seizure Level of alertness: Awake AEDs during EEG study: LTG Technical aspects: This EEG study was done with scalp electrodes positioned according to the 10-20 International system of electrode placement. Electrical activity was acquired at a sampling rate of 500Hz  and reviewed with a high frequency filter of 70Hz  and a low frequency filter of 1Hz . EEG data were recorded continuously and digitally stored. Description: No posterior dominant rhythm was seen. EEG showed continuous generalized and maximal right posterior quadrant  3 to 6 Hz theta-delta slowing. Hyperventilation and photic stimulation were not performed.   Parts of study were difficult to review due to significant myogenic artifact. ABNORMALITY -Continued slow, generalized and maximal right posterior quadrant IMPRESSION: This technically difficult study is suggestive of cortical dysfunction in right posterior quadrant suggestive of underlying structural abnormality, post-ictal state. There is also moderate diffuse encephalopathy, nonspecific etiology. No seizures or epileptiform discharges were seen throughout the recording. Lora Havens   CT HEAD CODE STROKE WO CONTRAST  Result Date: 12/19/2019 CLINICAL DATA:  Code stroke. Acute neuro deficit. Left arm weakness. Left facial droop. History of craniotomy for subdural hematoma. History of brain cancer. EXAM: CT HEAD WITHOUT CONTRAST TECHNIQUE: Contiguous axial images were obtained from the base of the skull through the vertex without intravenous contrast. COMPARISON:  CT head 08/23/2019 FINDINGS: Brain: Left frontal ventricular shunt passes through the third ventral into the suprasellar cistern. This is unchanged in position. No hydrocephalus. Ventricle size stable. Small low-density chronic subdural fluid collection on the left is stable. No acute subdural hemorrhage. 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 Encephalomalacia right parietal lobe is unchanged. Extensive atrophy in the cerebellum bilaterally also unchanged. 16 mm dural based mass in the left frontal lobe unchanged from prior studies compatible with meningioma. No brain edema. Calcification in the basal ganglia bilaterally left greater than right stable. Negative for acute infarct.  Negative for acute hemorrhage. Vascular: Negative for hyperdense vessel Skull: Suboccipital craniectomy on the left. Large right hemispheric craniotomy. Sinuses/Orbits: Mild mucosal edema paranasal sinuses. Normal orbit bilaterally. Other: None ASPECTS  (  [place] Stroke Program Early CT Score) - Ganglionic level infarction (caudate, lentiform nuclei, internal capsule, insula, M1-M3 cortex): 7 - Supraganglionic infarction (M4-M6 cortex): 3 Total score (0-10 with 10 being normal): 10 IMPRESSION: 1. No acute abnormality and no change from the prior CT. 2. ASPECTS is 10 3. Extensive postsurgical changes with right craniotomy and left suboccipital craniectomy. 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 4. 16 mm left frontal meningioma unchanged. 5. Code stroke imaging results were communicated on 12/19/2019 at 2:51 pm to provider Rory Percy via [place] Electronically Signed   By: Franchot Gallo M.D.   On: 12/19/2019 14:54   DG Swallowing Func-Speech Pathology  Final Result    CT ANGIO NECK W OR WO CONTRAST  Final Result    CT ANGIO HEAD W OR WO CONTRAST  Final Result    DG Chest Port 1 View  Final Result    MR BRAIN W WO CONTRAST  Final Result    CT HEAD CODE STROKE WO CONTRAST  Final Result      Scheduled Meds: . aspirin  325 mg Oral Daily  . atorvastatin  20 mg Oral Daily  . FLUoxetine  40 mg Oral BID  . fondaparinux (ARIXTRA) injection  2.5 mg Subcutaneous Daily  . haloperidol  2 mg Oral BID  . lamoTRIgine  125 mg Oral BID  . levothyroxine  50 mcg Oral QAC breakfast  . loratadine  10 mg Oral Daily  . polyethylene glycol  17 g Oral Daily   PRN Meds: acetaminophen **OR** acetaminophen (TYLENOL) oral liquid 160 mg/5 mL **OR** acetaminophen, Resource ThickenUp Clear, senna-docusate Continuous Infusions:    LOS: 2 days  Time spent: Greater than 50% of the 35 minute visit was spent in counseling/coordination of care for the patient as laid out in the A&P.   Dwyane Dee, MD [facility] 12/21/2019, 1:39 PM

## 2019-12-21 NOTE — Progress Notes (Signed)
Paged MD in light of pt continuing to refuse PO meds and has just refused pudding and applesauce for me (did offer to see if pt would be amenable to any sort of po intake at this time, thinking perhaps he does not like the taste when it contains crushed meds or prefers pudding vs applesauce). Pt continues to refuse and reject pudding and applesauce. Pt is to receive PO lamictal for hx seizures tonight, and did receive in report that rounding MD was made aware but there have been no changes to meds in regards to this. Pt is exhibiting no seizure-like activity at this time and is calm, quiet and awake in bed, watching TV and tracking this RN throughout the room. Able to follow some simple commands. no s/s discomfort or distress noted at this time. Spoke with on call MD who says "I will leave a note for the morning, but as long as he is asymptomatic I am fine with him going without for tonight. Call me if there is any sign of activity."

## 2019-12-21 NOTE — Progress Notes (Signed)
Echocardiogram 2D Echocardiogram with definity has been performed.  Darlina Sicilian M 12/21/2019, 8:33 AM

## 2019-12-21 NOTE — Evaluation (Signed)
Occupational Therapy Evaluation Patient Details Name: Glen [name] MRN: [id number] DOB: February 11, 1970 Today's Date: 12/21/2019    History of Present Illness Pt is a 49 year old male who presented with acute L-sided weakness, choking on food, unresponsiveness, and decreased mental status. Per pt's mother, the pt has been shaky and slow with movement and dropping objects more for the past week. CTA of head revealed meningioma of anterior L convexity. 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   Clinical Impression   Per chart, pt has a history of communication deficits and was unable to verbalize his home set-up and PLOF. Per chart, pt walks sometimes with a walker at baseline. Unable to obtain PLOF info at this time.  Pt presents with L hemiplegia and significant weakness in his L side. Pt requires maxA for all bed mobility  due to his deficits in coordination, strength, and balance. Pt required max hand over hand assist for simple grooming tasks and was unable to initiate selfcare tasks. Pt would benefit from acute OT services to address impairments to maximize level of function and safety   Follow Up Recommendations  CIR;Supervision/Assistance - 24 hour;Other (comment) (will need SNF if not able to go to CIR)    Equipment Recommendations  Other (comment) (TBD at next venue of care)    Recommendations for Other Services Rehab consult     Precautions / Restrictions Precautions Precautions: Fall Precaution Comments: seizures, L hemiplegia, commuincation deficits at baseline Restrictions Weight Bearing Restrictions: No      Mobility Bed Mobility Overal bed mobility: Needs Assistance Bed Mobility: Supine to Sit;Rolling Rolling: Max assist   Supine  to sit: Max assist;HOB elevated     General bed mobility comments: Attempted rolling  to either side in bed, however pt unable to initiate roll to R but did attempt to reach with L hand for R bed rail with max A and hand over hand assist for placement. Max A with HOB elevated to manage LEs and trunk to sit EOB    Transfers                 General transfer comment: did not attempt due to Poor posture and balance while seated, may also need +2 assist . Per PT note, pt stood at EOB x max hand held asssit to SPT    Balance Overall balance assessment: Needs assistance Sitting-balance support: Bilateral upper extremity supported;Feet supported Sitting balance-Leahy Scale: Poor Sitting balance - Comments: Max A to maintain balance EOB statically due to posterior lean. Postural control: Posterior lean;Left lateral lean     Standing balance comment: unable                           ADL either performed or assessed with clinical judgement   ADL Overall ADL's : Needs assistance/impaired Eating/Feeding: Total assistance   Grooming: Wash/dry face;Maximal assistance Grooming Details (indicate cue type and reason): max hand over hand assist to wash face seated EOB, Poor trunk control/balance Upper Body Bathing: Total assistance   Lower Body Bathing: Total assistance   Upper Body Dressing : Total assistance   Lower Body Dressing: Total assistance   Toilet Transfer: Total assistance   Toileting- Clothing Manipulation and Hygiene: Total assistance Toileting - Clothing Manipulation Details (indicate cue  type and reason): requires use of bed pan Tub/ Shower Transfer: Total assistance     General ADL Comments: PLOF for ADLs unknown at this time. Pt with Poor trunk control and balance seated EOB and unable to follow commands/cues to initiate tasks     Vision Patient Visual Report:  (unable to properly assess due to cognition)       Perception     Praxis       Pertinent Vitals/Pain Pain Assessment: Faces Faces Pain Scale: Hurts little more Pain Location: generalized with mobility Pain Descriptors / Indicators: Grimacing;Moaning Pain Intervention(s): Limited activity within patient's tolerance;Monitored during session;Repositioned     Hand Dominance Right (per previous chart)   Extremity/Trunk Assessment Upper Extremity Assessment Upper Extremity Assessment: Generalized weakness;LUE deficits/detail LUE Deficits / Details: L hemiplegia   Lower Extremity Assessment Lower Extremity Assessment: Defer to PT evaluation   Cervical / Trunk Assessment Cervical / Trunk Assessment: Normal   Communication Communication Communication: Receptive difficulties;Expressive difficulties   Cognition Arousal/Alertness: Awake/alert Behavior During Therapy: Flat affect Overall Cognitive Status: History of cognitive impairments - at baseline                                 General Comments: Pt able to follow ~20% of multi-modal cues. Pt unable to correct postures without max cues and assist to maintain safety.   General Comments       Exercises     Shoulder Instructions      Home Living Family/patient expects to be discharged to:: Private residence Living Arrangements: Parent Available Help at Discharge: Family Type of Home: House Home Access: Ramped entrance     [place]: One level     Bathroom Shower/Tub: Occupational psychologist: Handicapped height [place] Accessibility: Yes How Accessible: Accessible via walker     Additional Comments: Pt unable to verbalize home set-up or PLOF.  Per chart review: pt has communication deficits including speaking very few words and being difficult to understand and pt also walks sometimes with a walker.  Lives With: Family    Prior Functioning/Environment          Comments: PLOF unknown at this time. Pt unable to provide history . Walks sometimes with a walker, per  chart.        OT Problem List: Decreased strength;Impaired balance (sitting and/or standing);Decreased cognition;Impaired tone;Decreased range of motion;Decreased knowledge of use of DME or AE;Decreased coordination;Decreased activity tolerance;Impaired UE functional use      OT Treatment/Interventions: Therapeutic exercise;Neuromuscular education;Therapeutic activities;Self-care/ADL training    OT Goals(Current goals can be found in the care plan section) Acute Rehab OT Goals Patient Stated Goal: unable to state OT Goal Formulation: Patient unable to participate in goal setting Time For Goal Achievement: 01/04/20 Potential to Achieve Goals: Fair ADL Goals Pt Will Perform Grooming: with mod assist;sitting Pt Will Transfer to Toilet: with mod assist;stand pivot transfer;squat pivot transfer;bedside commode Additional ADL Goal #1: Pt will tolerate PROM to L UE to decrease risk of contracure and skin breakdown Additional ADL Goal #2: Pt will complete bed mobility mod A to sit EOB for sitting balance/tolerance tasks  OT Frequency: Min 2X/week   Barriers to D/C:            Co-evaluation              AM-PAC OT "6 Clicks" Daily Activity     Outcome Measure Help from another person eating  meals?: Total Help from another person taking care of personal grooming?: A Lot Help from another person toileting, which includes using toliet, bedpan, or urinal?: Total Help from another person bathing (including washing, rinsing, drying)?: Total Help from another person to put on and taking off regular upper body clothing?: Total Help from another person to put on and taking off regular lower body clothing?: Total 6 Click Score: 7   End of Session    Activity Tolerance: Patient limited by fatigue Patient left: in bed;with call bell/phone within reach;with bed alarm set  OT Visit Diagnosis: Other abnormalities of gait and mobility (R26.89);Muscle weakness (generalized) (M62.81);Other  symptoms and signs involving cognitive function;Cognitive communication deficit (R41.841);Hemiplegia and hemiparesis Hemiplegia - Right/Left: Left Hemiplegia - dominant/non-dominant: Non-Dominant                Time: 4540-9811 OT Time Calculation (min): 19 min Charges:  OT General Charges $OT Visit: 1 Visit OT Evaluation $OT Eval Moderate Complexity: 1 Mod    Britt Bottom 12/21/2019, 1:19 PM

## 2019-12-21 NOTE — Consult Note (Signed)
Palliative Medicine Inpatient Consult Note  Reason for consult:  Goals of Care "need [place] discussions with family; patient prognosis appears to be worsening (not taking in nutrition)"  HPI:  Per intake H&P --> Glen [name] a 49 y.o.malewith medical history significant offourth ventricle medulla blastoma, status post surgery in 1995, left sided meningioma status post resection in 2004,seizure disorder,chronic ambulatory dysfunction on roller walker, hypothyroidism who presented with new onset of left-sided weakness, difficulty swallowing and altered mentation.  He was unable to provide any meaningful information on admission.  It was reported that patient's mom had noticed the patient was becoming more tremulous and decreased movement in his hands with associated dropping utensils when eating.  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  Palliative care was asked to get involved to aid in goals of care conversations.  Clinical Assessment/Goals of Care: I have reviewed medical records including EPIC notes, labs and imaging, received report from bedside RN, assessed the patient who was nonfocal and not tremendously interactive upon assessment.    I called patient's mother Glen [name] to further discuss diagnosis prognosis, GOC, EOL wishes, disposition and options.   I introduced Palliative Medicine as specialized medical care for people living with serious illness. It focuses on providing relief from the symptoms and stress of a serious illness. The goal is to improve quality of life for both the patient and the family.  Glen [name] shares with me that Glen [name] lives with her in [place], [place].  He is separated from his wife who left him shortly after his cancer diagnosis.  He has a son who is 73 years old who he is estranged from.  He is is a  former Theme park manager.  He is someone who has a deep faith in the [place] and practices within the [employer].  From a functional perspective Glen [name] has been fairly debilitated since 2015 when he suffered a stroke.  His mother and father help with all basic activities of daily living.  He was at one point able to feed himself though that ability has dwindled.  He has been wheelchair dependent for a number of years now.  A detailed discussion was had today regarding advanced directives there are none on file for Glen [name] though his mother Glen [name] does vocalize that she has been and remains to be his primary Media planner.    Concepts specific to code status, artifical feeding and hydration, continued IV antibiotics and rehospitalization was had. I reviewed what a resuscitation event could look like in [place] situation.  Glen [name] is very realistic that she would not wish for him to be hooked up to tubes or machines.  She shares that she would never want him to be in a vegetative state as one of her family members is at the present time.  She states that some days she wishes that he would just "go on to be with the [place]".  She vocalizes that he is a DO NOT RESUSCITATE DO NOT INTUBATE CODE STATUS.  Glen [name] shares she would not want life prolonging measures to be pursued for Glen [name] as he has been in poor health for so long she would hate for him to suffer further.  In regards to Glen [name]'s poor nutritional state.  Glen [name] and I discussed that he is not someone who would have great benefit from placement of a core track  or a gastrostomy tube.  Glen [name] shares that she would not want that for him as she does believe this would prolong a level of "suffering" and he has done this for long enough  We talked about if Glen [name] states should continue to worsen or lacking improvement that it would be of utility to consider hospice care. I described hospice as a service for patients for have a life expectancy of < 6 months. It  preserves dignity and quality at the end phases of life. The focus changes from curative to symptom relief.   The difference between a aggressive medical intervention path  and a palliative comfort care path for this patient at this time was had.  Glen [name] shares with me that she plans to speak to the rest of her family regarding Glen [name]'s present health state.  She is hopeful to have a determination in regards to which direction would be best to travel as early as tomorrow.  I shared that we will be available to follow-up with her and help guide her through this very difficult process.  Discussed the importance of continued conversation with family and their  medical providers regarding overall plan of care and treatment options, ensuring decisions are within the context of the patients values and GOCs.  Decision Maker: Glen [name] (mother) [phone]  SUMMARY OF RECOMMENDATIONS   DNAR/DNI  Treat what is treatable  Ongoing discussions regarding potential for hospice.  Patient's mother, Glen [name] wanted to speak to the rest of her family pertaining to this topic.    Plan to meet with the patient's sister Glen [name] tomorrow at bedside.  Spiritual support patient is Psychiatric nurse Care Planning: DNAR/DNI    Palliative Prophylaxis:   Oral Care, Mobility, Pain, Aspiration, Delirium  Additional Recommendations (Limitations, Scope, Preferences): Treat what is treatable   Psycho-social/Spiritual:   Desire for further Chaplaincy support: Yes - [facility]  Additional Recommendations: Education on chronic disease processes   Prognosis: Poor in the setting of stroke - FTT with poor baseline functional state  Discharge Planning: Unclear  Vitals:   12/21/19 0743 12/21/19 1138  BP: 107/76 106/67  Pulse: 66 67  Resp: 17 18  Temp: 98 F (36.7 C) 97.9 F (36.6 C)  SpO2: 94% 91%    Intake/Output Summary (Last 24 hours) at 12/21/2019 1603 Last data filed at 12/21/2019  9924 Gross per 24 hour  Intake --  Output 850 ml  Net -850 ml   Last Weight  Most recent update: 12/20/2019  4:46 AM   Weight  86 kg (189 lb 9.5 oz)          Gen:  Caucasian M in NAD HEENT: Dry mucous membranes CV: Regular rate and rhythm  PULM: clear to auscultation bilaterally  ABD: soft/nontender  EXT: No edema  Neuro: Non-verbal, not following commands  PPS:   This conversation/these recommendations were discussed with patient primary care team, Dr. Sabino Gasser  Time In: 1500 Time Out: 1610 Total Time: 70 Greater than 50%  of this time was spent counseling and coordinating care related to the above assessment and plan.  [place] Team Team Cell Phone: [phone] Please utilize secure chat with additional questions, if there is no response within 30 minutes please call the above phone number  Palliative Medicine Team providers are available by phone from 7am to 7pm daily and can be reached through the team cell phone.  Should this patient require assistance outside of these hours, please call the patient's attending  physician.

## 2019-12-22 DIAGNOSIS — Z7189 Other specified counseling: Secondary | ICD-10-CM | POA: Diagnosis not present

## 2019-12-22 DIAGNOSIS — Z66 Do not resuscitate: Secondary | ICD-10-CM | POA: Diagnosis not present

## 2019-12-22 DIAGNOSIS — Z515 Encounter for palliative care: Secondary | ICD-10-CM | POA: Diagnosis not present

## 2019-12-22 DIAGNOSIS — I6302 Cerebral infarction due to thrombosis of basilar artery: Secondary | ICD-10-CM | POA: Diagnosis not present

## 2019-12-22 LAB — CBC WITH DIFFERENTIAL/PLATELET
Abs Immature Granulocytes: 0.06 10*3/uL (ref 0.00–0.07)
Basophils Absolute: 0 10*3/uL (ref 0.0–0.1)
Basophils Relative: 1 %
Eosinophils Absolute: 0.2 10*3/uL (ref 0.0–0.5)
Eosinophils Relative: 2 %
HCT: 41.3 % (ref 39.0–52.0)
Hemoglobin: 14.3 g/dL (ref 13.0–17.0)
Immature Granulocytes: 1 %
Lymphocytes Relative: 23 %
Lymphs Abs: 1.9 10*3/uL (ref 0.7–4.0)
MCH: 29.6 pg (ref 26.0–34.0)
MCHC: 34.6 g/dL (ref 30.0–36.0)
MCV: 85.5 fL (ref 80.0–100.0)
Monocytes Absolute: 0.8 10*3/uL (ref 0.1–1.0)
Monocytes Relative: 9 %
Neutro Abs: 5.4 10*3/uL (ref 1.7–7.7)
Neutrophils Relative %: 64 %
Platelets: 242 10*3/uL (ref 150–400)
RBC: 4.83 MIL/uL (ref 4.22–5.81)
RDW: 12.7 % (ref 11.5–15.5)
WBC: 8.3 10*3/uL (ref 4.0–10.5)
nRBC: 0 % (ref 0.0–0.2)

## 2019-12-22 LAB — BASIC METABOLIC PANEL
Anion gap: 8 (ref 5–15)
BUN: 12 mg/dL (ref 6–20)
CO2: 27 mmol/L (ref 22–32)
Calcium: 9.3 mg/dL (ref 8.9–10.3)
Chloride: 101 mmol/L (ref 98–111)
Creatinine, Ser: 1.06 mg/dL (ref 0.61–1.24)
GFR, Estimated: >60 mL/min (ref >60)
Glucose, Bld: 95 mg/dL (ref 70–99)
Potassium: 3.7 mmol/L (ref 3.5–5.1)
Sodium: 136 mmol/L (ref 135–145)

## 2019-12-22 LAB — MAGNESIUM: Magnesium: 1.9 mg/dL (ref 1.7–2.4)

## 2019-12-22 MED ORDER — BIOTENE DRY MOUTH MT LIQD: 15.0000 mL | OROMUCOSAL | Status: DC | PRN

## 2019-12-22 MED ORDER — BISACODYL 10 MG RE SUPP: 10.0000 mg | Freq: Every day | RECTAL | Status: DC | PRN

## 2019-12-22 MED ORDER — MORPHINE SULFATE (PF) 2 MG/ML IV SOLN: 2.0000 mg | INTRAVENOUS | Status: DC | PRN

## 2019-12-22 MED ORDER — ONDANSETRON HCL 4 MG/2ML IJ SOLN: 4.0000 mg | Freq: Four times a day (QID) | INTRAMUSCULAR | Status: DC | PRN

## 2019-12-22 MED ORDER — LORAZEPAM 2 MG/ML IJ SOLN
0.5000 mg | INTRAMUSCULAR | Status: DC | PRN
  Administered 2019-12-22: 1 mg via INTRAVENOUS
  Filled 2019-12-22: qty 1

## 2019-12-22 MED ORDER — GLYCOPYRROLATE 0.2 MG/ML IJ SOLN: 0.4000 mg | INTRAMUSCULAR | Status: DC | PRN

## 2019-12-22 NOTE — Progress Notes (Signed)
PROGRESS NOTE    Glen [name]   NOM:[id number]  DOB: 07-27-1970  DOA: 12/19/2019     3  PCP: Rochel Brome, MD  CC: weakness, AMS  Hospital Course: Glen [name] is a 49 y.o. male with medical history significant of fourth ventricle medulla blastoma, status post surgery in 1995, left sided meningioma status post resection in 2004, seizure disorder, chronic ambulatory dysfunction on roller walker, hypothyroidism who presented with new onset of left-sided weakness, difficulty swallowing and altered mentation.  He was unable to provide any meaningful information on admission.  It was reported that patient's mom had noticed the patient was becoming more tremulous and decreased movement in his hands with associated dropping utensils when eating.  After an acute episode of choking on food at home and becoming unresponsive, he was brought to the ER for further evaluation. There was concern for possible stroke and patient underwent work-up.  MRI brain showed an acute infarct involving the right pons.  There was also underlying severe atrophy.    Interval History:  No acute issues or events overnight, not interactive this morning, palliative care reevaluating, family is agreed for palliative care and comfort care measures, no longer interested in CIR.  Defer to palliative care and family for further dispo planning to facility versus home with hospice.  ROS: Review of systems not obtained due to patient factors. Cognitive impairment   Assessment & Plan:  Goals of care, comfort measures  -Palliative care following, appreciate insight recommendations -Possible disposition the next few days Home with hospice versus hospice house.  Will discuss with family and palliative care plan for disposition. 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  Patient had left-sided weakness,  dysarthria, aphasia -Discontinue all medications other than comfort measure medications  Seizure disorder -Continue Lamictal, patient continues to refuse PO intake -transitioning to hospice/comfort measures as above  Hypothyroidism -Continue Synthroid  History of brain tumor, involving meningioma and medulloblastoma -On hospice/comfort measures as above  Hx of cerebritis Continue comfort measures as above  Antimicrobials: None  DVT prophylaxis: Discontinued -continue comfort measures as above Code Status: Full Family Communication: None present Disposition Plan: Status is: Inpatient  Remains inpatient appropriate because:Altered mental status, IV treatments appropriate due to intensity of illness or inability to take PO and Inpatient level of care appropriate due to severity of illness   Dispo: The patient is from: Home              Anticipated d/c is to: Pending family discussion, home with hospice versus hospice house versus discharge to facility with hospice              Anticipated d/c date is: > 3 days              Patient currently is not medically stable to d/c.  Objective: Blood pressure 112/79, pulse 74, temperature 98.2 F (36.8 C), temperature source Axillary, resp. rate (!) 22, height 6\' 2"  (1.88 m), weight 86 kg, SpO2 94 %.  Examination: General appearance: Chronically ill-appearing adult man appearing much older than actual age lying in bed in no distress and unable to interact or follow commands Head: Chronic skull deformities noted Eyes: EOMI Lungs: clear to auscultation bilaterally Heart: regular rate and rhythm and S1, S2 normal Abdomen: normal findings: bowel sounds normal and soft, non-tender Extremities: No edema Skin: warm, dry, intact Neurologic: Does not follow commands.  Left upper extremity 1/5 strength.  Remains nonverbal  Consultants:   Neurology  Procedures:     Data Reviewed: I have personally reviewed following labs and imaging  studies Results for orders placed or performed during the hospital encounter of 12/19/19 (from the past 24 hour(s))  Basic metabolic panel     Status: None   Collection Time: 12/22/19  1:21 AM  Result Value Ref Range   Sodium 136 135 - 145 mmol/L   Potassium 3.7 3.5 - 5.1 mmol/L   Chloride 101 98 - 111 mmol/L   CO2 27 22 - 32 mmol/L   Glucose, Bld 95 70 - 99 mg/dL   BUN 12 6 - 20 mg/dL   Creatinine, Ser 1.06 0.61 - 1.24 mg/dL   Calcium 9.3 8.9 - 10.3 mg/dL   GFR, Estimated >60 >60 mL/min   Anion gap 8 5 - 15  CBC with Differential/Platelet     Status: None   Collection Time: 12/22/19  1:21 AM  Result Value Ref Range   WBC 8.3 4.0 - 10.5 K/uL   RBC 4.83 4.22 - 5.81 MIL/uL   Hemoglobin 14.3 13.0 - 17.0 g/dL   HCT 41.3 39 - 52 %   MCV 85.5 80.0 - 100.0 fL   MCH 29.6 26.0 - 34.0 pg   MCHC 34.6 30.0 - 36.0 g/dL   RDW 12.7 11.5 - 15.5 %   Platelets 242 150 - 400 K/uL   nRBC 0.0 0.0 - 0.2 %   Neutrophils Relative % 64 %   Neutro Abs 5.4 1.7 - 7.7 K/uL   Lymphocytes Relative 23 %   Lymphs Abs 1.9 0.7 - 4.0 K/uL   Monocytes Relative 9 %   Monocytes Absolute 0.8 0.1 - 1.0 K/uL   Eosinophils Relative 2 %   Eosinophils Absolute 0.2 0.0 - 0.5 K/uL   Basophils Relative 1 %   Basophils Absolute 0.0 0.0 - 0.1 K/uL   Immature Granulocytes 1 %   Abs Immature Granulocytes 0.06 0.00 - 0.07 K/uL  Magnesium     Status: None   Collection Time: 12/22/19  1:21 AM  Result Value Ref Range   Magnesium 1.9 1.7 - 2.4 mg/dL    Recent Results (from the past 240 hour(s))  Urine culture     Status: Abnormal   Collection Time: 12/19/19  4:31 PM   Specimen: Urine, Random  Result Value Ref Range Status   Specimen Description URINE, RANDOM  Final   Special Requests NONE  Final   Culture (A)  Final    <10,000 COLONIES/mL INSIGNIFICANT GROWTH Performed at [facility], [address] [place], [place] [address]    Report Status 12/20/2019 FINAL  Final  Resp Panel by RT-PCR (Flu A&B, Covid)  Nasopharyngeal Swab     Status: None   Collection Time: 12/19/19  4:31 PM   Specimen: Nasopharyngeal Swab; Nasopharyngeal(NP) swabs in vial transport medium  Result Value Ref Range Status   SARS Coronavirus 2 by RT PCR NEGATIVE NEGATIVE Final    Comment: (NOTE) SARS-CoV-2 target nucleic acids are NOT DETECTED.  The SARS-CoV-2 RNA is generally detectable in upper respiratory specimens during the acute phase of infection. The lowest concentration of SARS-CoV-2 viral copies this assay can detect is 138 copies/mL. A negative result does not preclude SARS-Cov-2 infection and should not be used as the sole basis for treatment or other patient management decisions. A negative result may occur with  improper specimen collection/handling, submission of specimen other than nasopharyngeal  swab, presence of viral mutation(s) within the areas targeted by this assay, and inadequate number of viral copies(<138 copies/mL). A negative result must be combined with clinical observations, patient history, and epidemiological information. The expected result is Negative.  Fact Sheet for Patients:  EntrepreneurPulse.com.au  Fact Sheet for Healthcare Providers:  IncredibleEmployment.be  This test is no t yet approved or cleared by the [place] FDA and  has been authorized for detection and/or diagnosis of SARS-CoV-2 by FDA under an Emergency Use Authorization (EUA). This EUA will remain  in effect (meaning this test can be used) for the duration of the COVID-19 declaration under Section 564(b)(1) of the Act, 21 U.S.C.section 360bbb-3(b)(1), unless the authorization is terminated  or revoked sooner.       Influenza A by PCR NEGATIVE NEGATIVE Final   Influenza B by PCR NEGATIVE NEGATIVE Final    Comment: (NOTE) The Xpert Xpress SARS-CoV-2/FLU/RSV plus assay is intended as an aid in the diagnosis of influenza from Nasopharyngeal swab specimens and should not be  used as a sole basis for treatment. Nasal washings and aspirates are unacceptable for Xpert Xpress SARS-CoV-2/FLU/RSV testing.  Fact Sheet for Patients: EntrepreneurPulse.com.au  Fact Sheet for Healthcare Providers: IncredibleEmployment.be  This test is not yet approved or cleared by the [place] FDA and has been authorized for detection and/or diagnosis of SARS-CoV-2 by FDA under an Emergency Use Authorization (EUA). This EUA will remain in effect (meaning this test can be used) for the duration of the COVID-19 declaration under Section 564(b)(1) of the Act, 21 U.S.C. section 360bbb-3(b)(1), unless the authorization is terminated or revoked.  Performed at [facility], [place] [address], [place], [place] [address]      Radiology Studies: DG Swallowing Func-Speech Pathology  Result Date: 12/20/2019 Objective Swallowing Evaluation: Type of Study: MBS-Modified Barium Swallow Study  Patient Details Name: CATLIN AYCOCK MRN: [id number] Date of Birth: 1970-11-30 Today's Date: 12/20/2019 Time: SLP Start Time (ACUTE ONLY): 1330 -SLP Stop Time (ACUTE ONLY): 1349 SLP Time Calculation (min) (ACUTE ONLY): 19 min Past Medical History: Past Medical History: Diagnosis Date . Brain cancer ([place])  . Hiccough  . Hypercholesteremia  . Other elevated white blood cell count  . Other encephalitis and encephalomyelitis  . Posterior reversible encephalopathy syndrome  . Seizure ([place])  . Stroke ([place])  . Testicular hypofunction  . Thyroid disorder  Past Surgical History: Past Surgical History: Procedure Laterality Date . APPENDECTOMY    1980s . BRAIN SURGERY    1993 . CRANIOTOMY Right 04/09/2017  Procedure: CRANIOTOMY HEMATOMA EVACUATION SUBDURAL;  Surgeon: Erline Levine, MD;  Location: [facility];  Service: Neurosurgery;  Laterality: Right; . DIRECT LARYNGOSCOPY N/A 04/09/2017  Procedure: DIRECT LARYNGOSCOPY;  Surgeon: Erline Levine, MD;  Location: [facility];  Service: Neurosurgery;   Laterality: N/A; . DIRECT LARYNGOSCOPY N/A 04/09/2017  Procedure: DIRECT LARYNGOSCOPY;  Surgeon: [name], MD;  Location: [facility];  Service: ENT;  Laterality: N/A; . DIRECT LARYNGOSCOPY N/A 04/18/2017  Procedure: DIRECT LARYNGOSCOPY;  Surgeon: [name], MD;  Location: [facility];  Service: ENT;  Laterality: N/A; . EYE SURGERY    X5 . TRACHEOSTOMY TUBE PLACEMENT N/A 04/18/2017  Procedure: TRANSNASAL FIBER OPTIC LARYNGOSCOPY;  Surgeon: [name], MD;  Location: [facility];  Service: ENT;  Laterality: N/A; HPI: ATUL DELUCIA is a 49 y.o. male with medical history significant of fourth ventricle medulla blastoma, status post surgery in 1995, left sided meningioma status post resection in 2004, seizure disorder, chronic ambulatory dysfunction on roller walker,  hypothyroidism, presented with new onset of left-sided weakness, choking on food and overall mental status.  MRI shows Acute infarct of the right pons. Mother lives with pt and reported that while eating lunch, patient suddenly choked on his food, having strenuous coughing and became unresponsive, head leaning toward right side and unable to move left-sided arm or leg.  Mother also reported that for the last week, patient has had trouble eating his food with frequent cough after swallowing. in 2019 he was admitted to CIR after SDH with craniectomy and was discharged on a puree diet due to perseverative chewing had severe cognitive impairment. At baseline he is only able to speak a few words.  No data recorded Assessment / Plan / Recommendation CHL IP CLINICAL IMPRESSIONS 12/20/2019 Clinical Impression weakness, but also right labial weakness exacerbated by rightward lean. Pt has anterior spillage and slow, groping oral manipulation of bolus with pooling buccal cavities, oral holdig with verbal reminders needed to initiate transit, particularly with purees and premature spillage causing instances of silent aspiration with thin liquids. Pt is totally unable to  masticate and only rolls soft soldis around his mouth. He will be at high risk of aspirating unmasticated solids if not on a pureed diet. Increased bolus size and decreased viscosity all increase penetrationa nd aspriation events. Pt is able to tolerate teaspoons and small cup sips of nectar thick liquids, though he will likely have some trace aspiration events of residue spilling to cords after sips. Recommend a puree diet and nectar thick liquids. Will f/u for family education SLP Visit Diagnosis Dysphagia, oropharyngeal phase (R13.12) Attention and concentration deficit following -- Frontal lobe and executive function deficit following -- Impact on safety and function Severe aspiration risk;Moderate aspiration risk   CHL IP TREATMENT RECOMMENDATION 12/20/2019 Treatment Recommendations Therapy as outlined in treatment plan below   No flowsheet data found. CHL IP DIET RECOMMENDATION 12/20/2019 SLP Diet Recommendations Dysphagia 1 (Puree) solids;Thin liquid Liquid Administration via Cup;Spoon Medication Administration Crushed with puree Compensations Slow rate;Small sips/bites Postural Changes --   CHL IP OTHER RECOMMENDATIONS 04/19/2017 Recommended Consults -- Oral Care Recommendations Oral care BID Other Recommendations --   CHL IP FOLLOW UP RECOMMENDATIONS 12/20/2019 Follow up Recommendations 24 hour supervision/assistance   CHL IP FREQUENCY AND DURATION 12/20/2019 Speech Therapy Frequency (ACUTE ONLY) min 2x/week Treatment Duration --      CHL IP ORAL PHASE 12/20/2019 Oral Phase Impaired Oral - Pudding Teaspoon -- Oral - Pudding Cup -- Oral - Honey Teaspoon -- Oral - Honey Cup -- Oral - Nectar Teaspoon Right anterior bolus loss;Weak lingual manipulation;Reduced posterior propulsion;Right pocketing in lateral sulci;Lingual/palatal residue;Left pocketing in lateral sulci;Pocketing in anterior sulcus;Delayed oral transit;Decreased bolus cohesion;Premature spillage Oral - Nectar Cup Right anterior bolus loss;Weak lingual  manipulation;Reduced posterior propulsion;Right pocketing in lateral sulci;Lingual/palatal residue;Left pocketing in lateral sulci;Pocketing in anterior sulcus;Delayed oral transit;Decreased bolus cohesion;Premature spillage Oral - Nectar Straw Right anterior bolus loss;Weak lingual manipulation;Reduced posterior propulsion;Right pocketing in lateral sulci;Lingual/palatal residue;Left pocketing in lateral sulci;Pocketing in anterior sulcus;Delayed oral transit;Decreased bolus cohesion;Premature spillage Oral - Thin Teaspoon Right anterior bolus loss;Weak lingual manipulation;Reduced posterior propulsion;Right pocketing in lateral sulci;Lingual/palatal residue;Left pocketing in lateral sulci;Pocketing in anterior sulcus;Delayed oral transit;Decreased bolus cohesion;Premature spillage Oral - Thin Cup -- Oral - Thin Straw Right anterior bolus loss;Weak lingual manipulation;Reduced posterior propulsion;Right pocketing in lateral sulci;Lingual/palatal residue;Left pocketing in lateral sulci;Pocketing in anterior sulcus;Delayed oral transit;Decreased bolus cohesion;Premature spillage;Holding of bolus Oral - Puree Right anterior bolus loss;Weak lingual manipulation;Reduced posterior propulsion;Right pocketing in lateral  sulci;Lingual/palatal residue;Left pocketing in lateral sulci;Pocketing in anterior sulcus;Delayed oral transit;Decreased bolus cohesion;Premature spillage;Holding of bolus Oral - Mech Soft Right anterior bolus loss;Weak lingual manipulation;Reduced posterior propulsion;Right pocketing in lateral sulci;Lingual/palatal residue;Left pocketing in lateral sulci;Pocketing in anterior sulcus;Delayed oral transit;Decreased bolus cohesion;Holding of bolus Oral - Regular -- Oral - Multi-Consistency -- Oral - Pill -- Oral Phase - Comment --  CHL IP PHARYNGEAL PHASE 12/20/2019 Pharyngeal Phase Impaired Pharyngeal- Pudding Teaspoon -- Pharyngeal -- Pharyngeal- Pudding Cup -- Pharyngeal -- Pharyngeal- Honey Teaspoon --  Pharyngeal -- Pharyngeal- Honey Cup -- Pharyngeal -- Pharyngeal- Nectar Teaspoon Pharyngeal residue - valleculae;Pharyngeal residue - pyriform;Reduced tongue base retraction Pharyngeal -- Pharyngeal- Nectar Cup Reduced tongue base retraction;Penetration/Aspiration during swallow;Penetration/Apiration after swallow;Pharyngeal residue - valleculae;Pharyngeal residue - pyriform Pharyngeal Material enters airway, remains ABOVE vocal cords and not ejected out Pharyngeal- Nectar Straw Reduced tongue base retraction;Pharyngeal residue - valleculae;Pharyngeal residue - pyriform Pharyngeal -- Pharyngeal- Thin Teaspoon -- Pharyngeal -- Pharyngeal- Thin Cup -- Pharyngeal -- Pharyngeal- Thin Straw -- Pharyngeal -- Pharyngeal- Puree Pharyngeal residue - valleculae;Pharyngeal residue - pyriform;Reduced tongue base retraction Pharyngeal -- Pharyngeal- Mechanical Soft Pharyngeal residue - valleculae;Pharyngeal residue - pyriform;Reduced tongue base retraction Pharyngeal -- Pharyngeal- Regular -- Pharyngeal -- Pharyngeal- Multi-consistency -- Pharyngeal -- Pharyngeal- Pill -- Pharyngeal -- Pharyngeal Comment --  No flowsheet data found. Herbie Baltimore, MA CCC-SLP Acute Rehabilitation Services Pager [phone] Office [phone] Lynann Beaver 12/20/2019, 3:10 PM              ECHOCARDIOGRAM COMPLETE  Result Date: 12/21/2019    ECHOCARDIOGRAM REPORT   Patient Name:   HARRIE CAZAREZ [facility] Date of Exam: 12/21/2019 Medical Rec #:  [id number]        Height:       74.0 in Accession #:    [id number]       Weight:       189.6 lb Date of Birth:  1970/02/23        BSA:          2.125 m Patient Age:    15 years         BP:           107/76 mmHg Patient Gender: M                HR:           66 bpm. Exam Location:  Inpatient Procedure: 2D Echo, Cardiac Doppler and Color Doppler Indications:    TIA 435.9 / G45.9  History:        Patient has no prior history of Echocardiogram examinations.                 Risk Factors:Dyslipidemia.  Seizure Disorder. Brain cancer.                 Thyroid disease.  Sonographer:    Darlina Sicilian RDCS Referring Phys: [id number] Lequita Halt  Sonographer Comments: Global longitudinal strain was attempted. IMPRESSIONS  1. Left ventricular ejection fraction, by estimation, is 55 to 60%. The left ventricle has normal function. The left ventricle has no regional wall motion abnormalities. There is mild asymmetric left ventricular hypertrophy of the basal-septal segment. Left ventricular diastolic parameters were normal.  2. Right ventricular systolic function is normal. The right ventricular size is normal.  3. The mitral valve is normal in structure. No evidence of mitral valve regurgitation. No evidence of mitral stenosis.  4. The aortic valve is tricuspid. Aortic valve regurgitation is not visualized. No aortic stenosis  is present. Comparison(s): No prior Echocardiogram. Conclusion(s)/Recommendation(s): Normal biventricular function without evidence of hemodynamically significant valvular heart disease. No intracardiac source of embolism detected on this transthoracic study. A transesophageal echocardiogram is recommended to exclude cardiac source of embolism if clinically indicated. FINDINGS  Left Ventricle: Left ventricular ejection fraction, by estimation, is 55 to 60%. The left ventricle has normal function. The left ventricle has no regional wall motion abnormalities. Definity contrast agent was given IV to delineate the left ventricular  endocardial borders. Global longitudinal strain performed but not reported based on interpreter judgement due to suboptimal tracking. The left ventricular internal cavity size was normal in size. There is mild asymmetric left ventricular hypertrophy of the basal-septal segment. Left ventricular diastolic parameters were normal. Right Ventricle: The right ventricular size is normal. Right vetricular wall thickness was not well visualized. Right ventricular systolic function is  normal. Left Atrium: Left atrial size was normal in size. Right Atrium: Right atrial size was not well visualized. Pericardium: There is no evidence of pericardial effusion. Mitral Valve: The mitral valve is normal in structure. No evidence of mitral valve regurgitation. No evidence of mitral valve stenosis. Tricuspid Valve: The tricuspid valve is normal in structure. Tricuspid valve regurgitation is not demonstrated. No evidence of tricuspid stenosis. Aortic Valve: The aortic valve is tricuspid. Aortic valve regurgitation is not visualized. No aortic stenosis is present. Pulmonic Valve: The pulmonic valve was not well visualized. Pulmonic valve regurgitation is not visualized. No evidence of pulmonic stenosis. Aorta: The aortic root is normal in size and structure and the ascending aorta was not well visualized. Venous: The inferior vena cava was not well visualized. IAS/Shunts: The atrial septum is grossly normal.  LEFT VENTRICLE PLAX 2D LVIDd:         4.60 cm  Diastology LVIDs:         3.50 cm  LV e' medial:    4.73 cm/s LV PW:         0.90 cm  LV E/e' medial:  8.5 LV IVS:        1.30 cm  LV e' lateral:   9.60 cm/s LVOT diam:     2.30 cm  LV E/e' lateral: 4.2 LV SV:         38 LV SV Index:   18 LVOT Area:     4.15 cm 3D Volume EF                         LV 3D EDV:   85.78 ml                         LV 3D ESV:   37.70 ml                          3D Volume EF                         LV 3D EF:    8.90 %                          3D Volume EF:                         3D EF:        56 % LEFT ATRIUM  Index LA diam:        3.40 cm 1.60 cm/m LA Vol (A2C):   20.8 ml 9.79 ml/m LA Vol (A4C):   21.7 ml 10.21 ml/m LA Biplane Vol: 21.3 ml 10.03 ml/m  AORTIC VALVE LVOT Vmax:   51.05 cm/s LVOT Vmean:  35.350 cm/s LVOT VTI:    0.091 m  AORTA Ao Root diam: 3.90 cm MITRAL VALVE MV Area (PHT): 2.99 cm    SHUNTS MV Decel Time: 254 msec    Systemic VTI:  0.09 m MV E velocity: 40.10 cm/s  Systemic Diam: 2.30 cm MV A  velocity: 38.60 cm/s MV E/A ratio:  1.04 Buford Dresser MD Electronically signed by Buford Dresser MD Signature Date/Time: 12/21/2019/2:20:58 PM    Final    DG Swallowing Func-Speech Pathology  Final Result    CT ANGIO NECK W OR WO CONTRAST  Final Result    CT ANGIO HEAD W OR WO CONTRAST  Final Result    DG Chest Port 1 View  Final Result    MR BRAIN W WO CONTRAST  Final Result    CT HEAD CODE STROKE WO CONTRAST  Final Result      Scheduled Meds: . aspirin  300 mg Rectal Daily  . atorvastatin  20 mg Oral Daily  . FLUoxetine  40 mg Oral BID  . fondaparinux (ARIXTRA) injection  2.5 mg Subcutaneous Daily  . haloperidol  2 mg Oral BID  . lamoTRIgine  125 mg Oral BID  . levothyroxine  50 mcg Oral QAC breakfast  . loratadine  10 mg Oral Daily  . polyethylene glycol  17 g Oral Daily   PRN Meds: acetaminophen **OR** acetaminophen (TYLENOL) oral liquid 160 mg/5 mL **OR** acetaminophen, Resource ThickenUp Clear, senna-docusate Continuous Infusions:    LOS: 3 days  Time spent: Greater than 50% of the 35 minute visit was spent in counseling/coordination of care for the patient as laid out in the A&P.   Little Ishikawa, DO [facility] 12/22/2019, 7:55 AM

## 2019-12-22 NOTE — Progress Notes (Signed)
Pt resting calmly in bed, family at bedside. Provided updates to family and addressed questions, as well as went over plan of care for the night and new PRN medications which were added and s/s to look for that he may be needing them. Family denies further questions at this time.

## 2019-12-22 NOTE — Progress Notes (Addendum)
Palliative Medicine Inpatient Follow Up Note  Reason for consult:  Goals of Care "need [place] discussions with family; patient prognosis appears to be worsening (not taking in nutrition)"  HPI:  Per intake H&P --> Glen [name] a 49 y.o.malewith medical history significant offourth ventricle medulla blastoma, status post surgery in 1995, left sided meningioma status post resection in 2004,seizure disorder,chronic ambulatory dysfunction on roller walker, hypothyroidism who presented with new onset of left-sided weakness, difficulty swallowing and altered mentation. He was unable to provide any meaningful information on admission. It was reported that patient's mom had noticed the patient was becoming more tremulous and decreased movement in his hands with associated dropping utensils when eating. 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  Palliative care was asked to get involved to aid in goals of care conversations.  Today's Discussion (12/22/2019): Chart reviewed.  Met with Glen [name] at bedside this morning.  He was able to open his eyes though unable to converse and/or follow directions meaningfully.  Per review of nursing notes he had refused to take medications as well as nutrition.  I spoke to patient's mother, Glen [name].  I shared with her that Glen [name] does not seem to be progressing in a positive direction and in the setting of his recent stroke I am concerned that he has significant deficits which may not resolve in time.  Glen [name] shares that Glen [name] has "suffered enough".  She shares that she had a very detailed conversation with Glen [name] other siblings and her husband last evening.  She did introduced the topic of hospice and identify with her family that this would likely be the best next step for him.  She shares that Glen [name] has suffered for the  past 27 years and she cannot bear to see it any longer.  Glen [name] reviewed with me that she adopted Glen [name] when he was only 52 days old.  She expresses that he is her "little boy" who she loves insurmountably.  She says that these decisions have been very hard for her and this "ain't easy".   We reviewed how Glen [name] at this point in time does not wish to pursue medications or nutrition.  At this point a feeding tube would likely not benefit Glen [name] given his exceptionally poor baseline level of function and likelihood of recurrent aspiration.  Glen [name] and I talked about inpatient hospice in transitioning her focus from aggressive care to symptom relief. We talked about transition to comfort measures in house and what that would entail inclusive of medications to control pain, dyspnea, agitation, nausea, itching, and hiccups. We discussed stopping all uneccessary measures such as blood draws, needle sticks, and frequent vital signs.   Glen [name] says that this is the past she would choose to take.  Primary medical team and nursing staff informed of these changes.  Questions and concerns addressed   Objective Assessment: Vital Signs Vitals:   12/22/19 1129 12/22/19 1157  BP: 112/78   Pulse:    Resp: (!) 24   Temp: 98.4 F (36.9 C)   SpO2:  94%    Intake/Output Summary (Last 24 hours) at 12/22/2019 1624 Last data filed at 12/21/2019 2008 Gross per 24 hour  Intake 50 ml  Output --  Net 50 ml   Last Weight  Most recent update: 12/20/2019  4:46 AM   Weight  86 kg (189 lb 9.5 oz)  Gen:  Caucasian M in NAD HEENT: Dry mucous membranes CV: Regular rate and rhythm  PULM: clear to auscultation bilaterally  ABD: soft/nontender  EXT: No edema  Neuro: Non-verbal, not following commands  SUMMARY OF RECOMMENDATIONS DNAR/DNI  Shift focus to comfort oriented care medications per [facility]  Appreciate transitions of care team inquiring with hospice of [place]  Spiritual support patient  is [employer]  Time Spent: 60 Greater than 50% of the time was spent in counseling and coordination of care ______________________________________________________________________________________ [place] Team Team Cell Phone: [phone] Please utilize secure chat with additional questions, if there is no response within 30 minutes please call the above phone number  Palliative Medicine Team providers are available by phone from 7am to 7pm daily and can be reached through the team cell phone.  Should this patient require assistance outside of these hours, please call the patient's attending physician.

## 2019-12-22 NOTE — Progress Notes (Incomplete)
   Palliative Medicine Inpatient Follow Up Note  Reason for consult:  Goals of Care "need [place] discussions with family; patient prognosis appears to be worsening (not taking in nutrition)"  HPI:  Per intake H&P --> Glen Moll L Lambethis a 49 y.o.malewith medical history significant offourth ventricle medulla blastoma, status post surgery in 1995, left sided meningioma status post resection in 2004,seizure disorder,chronic ambulatory dysfunction on roller walker, hypothyroidism who presented with new onset of left-sided weakness, difficulty swallowing and altered mentation. He was unable to provide any meaningful information on admission. It was reported that patient's mom had noticed the patient was becoming more tremulous and decreased movement in his hands with associated dropping utensils when eating. 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  Palliative care was asked to get involved to aid in goals of care conversations.  Today's Discussion (12/22/2019):   Objective Assessment: Vital Signs Vitals:   12/22/19 1129 12/22/19 1157  BP: 112/78   Pulse:    Resp: (!) 24   Temp: 98.4 F (36.9 C)   SpO2:  94%    Intake/Output Summary (Last 24 hours) at 12/22/2019 1208 Last data filed at 12/21/2019 2008 Gross per 24 hour  Intake 50 ml  Output -  Net 50 ml   Last Weight  Most recent update: 12/20/2019  4:46 AM   Weight  86 kg (189 lb 9.5 oz)           Gen:  Caucasian M in NAD HEENT: Dry mucous membranes CV: Regular rate and rhythm  PULM: clear to auscultation bilaterally  ABD: soft/nontender  EXT: No edema  Neuro: Non-verbal, not following commands  SUMMARY OF RECOMMENDATIONS DNAR/DNI  Transition focus to comfort caare  Spiritual support patient is [employer]  Time Spent: ** Greater than 50% of the time was spent in  counseling and coordination of care ______________________________________________________________________________________ [place] Team Team Cell Phone: [phone] Please utilize secure chat with additional questions, if there is no response within 30 minutes please call the above phone number  Palliative Medicine Team providers are available by phone from 7am to 7pm daily and can be reached through the team cell phone.  Should this patient require assistance outside of these hours, please call the patient's attending physician.

## 2019-12-22 NOTE — TOC Progression Note (Signed)
Transition of Care [facility]) - Progression Note    Patient Details  Name: Glen [name] MRN: [id number] Date of Birth: 1970-03-17  Transition of Care [facility]) CM/SW [place], [place] Phone Number: 12/22/2019, 1:51 PM  Clinical Narrative:     CSW received consult from NP that family would like inpatient hospice. Confirmed they would prefer [place]. Spoke with [place] who is now following.       Expected Discharge Plan and Services                                                 Social Determinants of Health (SDOH) Interventions    Readmission Risk Interventions No flowsheet data found.

## 2019-12-22 NOTE — Progress Notes (Signed)
Inpatient Rehab Admissions Coordinator:  MD note stated that pt was a better candidate for palliative care than CIR.  A palliative care consult occurred on 12/4.  SW, Coralee Pesa, informed [facility] that family has decided on inpatient hospice for pt.  AC will sign off.   Gayland Curry, [place], [place] Admissions Coordinator [phone]

## 2019-12-23 DIAGNOSIS — I6302 Cerebral infarction due to thrombosis of basilar artery: Secondary | ICD-10-CM | POA: Diagnosis not present

## 2019-12-23 NOTE — Progress Notes (Signed)
   Referral reviewed and spoke to the pt's sister [place]. Reviewed hospice care and discussed goals. They are in agreement with comfort care and our MD has approved the pt to come to the [facility]. I have sent paperwork for signature and received this back. Spoke to [facility] who will arrange ambulance transfer for first available.   Report number for nurse to call is [phone].   Thank You-- Webb Silversmith RN [phone]

## 2019-12-23 NOTE — TOC Transition Note (Addendum)
Transition of Care [facility]) - CM/SW Discharge Note   Patient Details  Name: Glen [name] MRN: [id number] Date of Birth: 1970-09-19  Transition of Care [facility]) CM/SW Contact:  Pollie Friar, RN Phone Number: 12/23/2019, 1:32 PM   Clinical Narrative:    Pt discharging to [facility] [facility]. CM has verified with [facility] that there is a bed available. Will arrange PTAR once papers are signed.   1445: PTAR arranged for transport and sister [name] aware. Bedside RN updated and d/c packet at the desk  Number for report: [phone]  Final next level of care: [place] Barriers to Discharge: No Barriers Identified   Patient Goals and CMS Choice   CMS Medicare.gov Compare Post Acute Care list provided to:: Patient Represenative (must comment) Choice offered to / list presented to : Parent  Discharge Placement                       Discharge Plan and Services                                     Social Determinants of Health (SDOH) Interventions     Readmission Risk Interventions No flowsheet data found.

## 2019-12-23 NOTE — Discharge Summary (Signed)
Physician Discharge Summary  PAYTEN BEAUMIER BSW:[id number] DOB: Aug 04, 1970 DOA: 12/19/2019  PCP: Rochel Brome, MD  Admit date: 12/19/2019 Discharge date: 12/23/2019  Admitted From: Home Disposition: Hospice  Discharge Condition: Grim CODE STATUS: DNR Diet recommendation: As tolerated  Brief/Interim Summary: Duane Lope Lambethis a 49 y.o.malewith medical history significant offourth ventricle medulla blastoma, status post surgery in 1995, left sided meningioma status post resection in 2004,seizure disorder,chronic ambulatory dysfunction on roller walker, hypothyroidism who presented with new onset of left-sided weakness, difficulty swallowing and altered mentation.  He was unable to provide any meaningful information on admission.  It was reported that patient's mom had noticed the patient was becoming more tremulous and decreased movement in his hands with associated dropping utensils when eating.  After an acute episode of choking on food at home and becoming unresponsive, he was brought to the ER for further evaluation. There was concern for possible stroke and patient underwent work-up.  MRI brain showed an acute infarct involving the right pons.  There was also underlying severe atrophy.  Patient has continued to decline since admission after acute right pons infarcts noted on MRI.  Patient also has notable seizure disorder and continues to refuse all medications, p.o. intake and at this point is essentially nonverbal.  Palliative care continues to follow, per discussion with palliative care family patient has been made comfort measures and is currently being transferred to hospice care.   Discharge Diagnoses:  Active Problems:   CVA (cerebral vascular accident) [facility])    Discharge Instructions  Discharge Instructions    Diet - low sodium heart healthy   Complete by: As directed    Increase activity slowly   Complete by: As directed      Allergies as of 12/23/2019       Reactions   Heparin Anaphylaxis   Patient has HITT   Lidocaine Hives   Quetiapine Other (See Comments)   Confusion   Depakote [divalproex Sodium] Rash   Phenytoin Sodium Extended Rash      Medication List    STOP taking these medications   atorvastatin 20 MG tablet Commonly known as: LIPITOR   FLUoxetine 40 MG capsule Commonly known as: PROZAC   haloperidol 2 MG tablet Commonly known as: HALDOL   lamoTRIgine 100 MG Tbdp   lamoTRIgine 25 MG tablet Commonly known as: LAMICTAL   levothyroxine 50 MCG tablet Commonly known as: SYNTHROID   loratadine 10 MG tablet Commonly known as: CLARITIN   polyethylene glycol 17 g packet Commonly known as: MIRALAX / GLYCOLAX   testosterone cypionate 200 MG/ML injection Commonly known as: DEPOTESTOSTERONE CYPIONATE       Follow-up Information    Suzzanne Cloud, NP. Go on 03/02/2020.   Specialty: Neurology Contact information: [address] [place] [address] [phone]              Allergies  Allergen Reactions  . Heparin Anaphylaxis    Patient has HITT  . Lidocaine Hives  . Quetiapine Other (See Comments)    Confusion  . Depakote [Divalproex Sodium] Rash  . Phenytoin Sodium Extended Rash    Consultations: Neuro, palliative care  Procedures/Studies: CT ANGIO HEAD W OR WO CONTRAST  Result Date: 12/20/2019 CLINICAL DATA:  [place] infarct EXAM: CT ANGIOGRAPHY HEAD AND NECK TECHNIQUE: Multidetector CT imaging of the head and neck was performed using the standard protocol during bolus administration of intravenous contrast. Multiplanar CT image reconstructions and MIPs were obtained to evaluate the vascular anatomy. Carotid stenosis  measurements (when applicable) are obtained utilizing NASCET criteria, using the distal internal carotid diameter as the denominator. CONTRAST:  20mL OMNIPAQUE IOHEXOL 350 MG/ML SOLN COMPARISON:  Brain MRI 12/19/2019 FINDINGS: CTA NECK FINDINGS SKELETON: There is no bony spinal canal  stenosis. No lytic or blastic lesion. OTHER NECK: Normal pharynx, larynx and major salivary glands. No cervical lymphadenopathy. Unremarkable thyroid gland. UPPER CHEST: No pneumothorax or pleural effusion. No nodules or masses. AORTIC ARCH: There is no calcific atherosclerosis of the aortic arch. There is no aneurysm, dissection or hemodynamically significant stenosis of the visualized portion of the aorta. Conventional 3 vessel aortic branching pattern. The visualized proximal subclavian arteries are widely patent. RIGHT CAROTID SYSTEM: Normal without aneurysm, dissection or stenosis. LEFT CAROTID SYSTEM: Normal without aneurysm, dissection or stenosis. VERTEBRAL ARTERIES: Left dominant configuration. Both origins are clearly patent. There is no dissection, occlusion or flow-limiting stenosis to the skull base (V1-V3 segments). CTA HEAD FINDINGS There is a meningioma of the anterior left convexity that measures 1.3 x 1.0 cm. Second meningioma adjacent to the left cavernous sinus versus venous contrast blush. POSTERIOR CIRCULATION: --Vertebral arteries: Normal V4 segments. --Inferior cerebellar arteries: Normal. --Basilar artery: Normal. --Superior cerebellar arteries: Normal. --Posterior cerebral arteries (PCA): Normal. ANTERIOR CIRCULATION: --Intracranial internal carotid arteries: Normal. --Anterior cerebral arteries (ACA): Normal. Both A1 segments are present. Patent anterior communicating artery (a-comm). --Middle cerebral arteries (MCA): Normal. VENOUS SINUSES: As permitted by contrast timing, patent. ANATOMIC VARIANTS: None Review of the MIP images confirms the above findings. IMPRESSION: 1. No intracranial arterial occlusion or high-grade stenosis. 2. A 1.3 x 1.0 cm meningioma of the anterior left convexity. Suspect second meningioma adjacent to the left cavernous sinus. 3. Normal CTA of the neck. Electronically Signed   By: Ulyses Jarred M.D.   On: 12/20/2019 01:53   CT ANGIO NECK W OR WO CONTRAST   Result Date: 12/20/2019 CLINICAL DATA:  [place]ine infarct EXAM: CT ANGIOGRAPHY HEAD AND NECK TECHNIQUE: Multidetector CT imaging of the head and neck was performed using the standard protocol during bolus administration of intravenous contrast. Multiplanar CT image reconstructions and MIPs were obtained to evaluate the vascular anatomy. Carotid stenosis measurements (when applicable) are obtained utilizing NASCET criteria, using the distal internal carotid diameter as the denominator. CONTRAST:  34mL OMNIPAQUE IOHEXOL 350 MG/ML SOLN COMPARISON:  Brain MRI 12/19/2019 FINDINGS: CTA NECK FINDINGS SKELETON: There is no bony spinal canal stenosis. No lytic or blastic lesion. OTHER NECK: Normal pharynx, larynx and major salivary glands. No cervical lymphadenopathy. Unremarkable thyroid gland. UPPER CHEST: No pneumothorax or pleural effusion. No nodules or masses. AORTIC ARCH: There is no calcific atherosclerosis of the aortic arch. There is no aneurysm, dissection or hemodynamically significant stenosis of the visualized portion of the aorta. Conventional 3 vessel aortic branching pattern. The visualized proximal subclavian arteries are widely patent. RIGHT CAROTID SYSTEM: Normal without aneurysm, dissection or stenosis. LEFT CAROTID SYSTEM: Normal without aneurysm, dissection or stenosis. VERTEBRAL ARTERIES: Left dominant configuration. Both origins are clearly patent. There is no dissection, occlusion or flow-limiting stenosis to the skull base (V1-V3 segments). CTA HEAD FINDINGS There is a meningioma of the anterior left convexity that measures 1.3 x 1.0 cm. Second meningioma adjacent to the left cavernous sinus versus venous contrast blush. POSTERIOR CIRCULATION: --Vertebral arteries: Normal V4 segments. --Inferior cerebellar arteries: Normal. --Basilar artery: Normal. --Superior cerebellar arteries: Normal. --Posterior cerebral arteries (PCA): Normal. ANTERIOR CIRCULATION: --Intracranial internal carotid arteries:  Normal. --Anterior cerebral arteries (ACA): Normal. Both A1 segments are present. Patent anterior communicating artery (a-comm). --  Middle cerebral arteries (MCA): Normal. VENOUS SINUSES: As permitted by contrast timing, patent. ANATOMIC VARIANTS: None Review of the MIP images confirms the above findings. IMPRESSION: 1. No intracranial arterial occlusion or high-grade stenosis. 2. A 1.3 x 1.0 cm meningioma of the anterior left convexity. Suspect second meningioma adjacent to the left cavernous sinus. 3. Normal CTA of the neck. Electronically Signed   By: Ulyses Jarred M.D.   On: 12/20/2019 01:53   MR BRAIN W WO CONTRAST  Result Date: 12/19/2019 CLINICAL DATA:  Stroke EXAM: MRI HEAD WITHOUT AND WITH CONTRAST TECHNIQUE: Multiplanar, multiecho pulse sequences of the brain and surrounding structures were obtained without and with intravenous contrast. CONTRAST:  74mL GADAVIST GADOBUTROL 1 MMOL/ML IV SOLN COMPARISON:  None. FINDINGS: Brain: There is an acute infarct of the right half of pons. No mass effect. There are multiple chronic microhemorrhages but no acute hemorrhage or extra-axial collection. There is right parieto-occipital encephalomalacia with diffusely advanced atrophy and ex vacuo dilatation of the ventricles. There is severe cerebellar atrophy. There is a left frontal approach shunt catheter that terminates in the 3rd ventricle. Vascular: Major flow voids are preserved. Skull and upper cervical spine: There are multiple old craniotomies. Sinuses/Orbits:No paranasal sinus fluid levels or advanced mucosal thickening. No mastoid or middle ear effusion. Normal orbits. IMPRESSION: 1. Acute infarct of the right pons. No hemorrhage or mass effect. 2. Severe atrophy. 3. Left frontal approach shunt catheter that terminates in the 3rd ventricle. Electronically Signed   By: Ulyses Jarred M.D.   On: 12/19/2019 19:38   DG Chest Port 1 View  Result Date: 12/19/2019 CLINICAL DATA:  Evaluate for aspiration. EXAM:  PORTABLE CHEST 1 VIEW COMPARISON:  04/19/2017 FINDINGS: The heart size and mediastinal contours are within normal limits. Low lung volumes. No pleural effusion or edema. No airspace densities. The visualized skeletal structures are unremarkable. IMPRESSION: Low lung volumes. No pneumonia. Electronically Signed   By: Kerby Moors M.D.   On: 12/19/2019 20:10   DG Swallowing Func-Speech Pathology  Result Date: 12/20/2019 Objective Swallowing Evaluation: Type of Study: MBS-Modified Barium Swallow Study  Patient Details Name: TAVIAN CALLANDER MRN: [id number] Date of Birth: 05-08-1970 Today's Date: 12/20/2019 Time: SLP Start Time (ACUTE ONLY): 1330 -SLP Stop Time (ACUTE ONLY): 1349 SLP Time Calculation (min) (ACUTE ONLY): 19 min Past Medical History: Past Medical History: Diagnosis Date . Brain cancer ([place])  . Hiccough  . Hypercholesteremia  . Other elevated white blood cell count  . Other encephalitis and encephalomyelitis  . Posterior reversible encephalopathy syndrome  . Seizure ([place])  . Stroke ([place])  . Testicular hypofunction  . Thyroid disorder  Past Surgical History: Past Surgical History: Procedure Laterality Date . APPENDECTOMY    1980s . BRAIN SURGERY    1993 . CRANIOTOMY Right 04/09/2017  Procedure: CRANIOTOMY HEMATOMA EVACUATION SUBDURAL;  Surgeon: Erline Levine, MD;  Location: [place];  Service: Neurosurgery;  Laterality: Right; . DIRECT LARYNGOSCOPY N/A 04/09/2017  Procedure: DIRECT LARYNGOSCOPY;  Surgeon: Erline Levine, MD;  Location: [place];  Service: Neurosurgery;  Laterality: N/A; . DIRECT LARYNGOSCOPY N/A 04/09/2017  Procedure: DIRECT LARYNGOSCOPY;  Surgeon: [name], MD;  Location: [place];  Service: ENT;  Laterality: N/A; . DIRECT LARYNGOSCOPY N/A 04/18/2017  Procedure: DIRECT LARYNGOSCOPY;  Surgeon: [name], MD;  Location: [place];  Service: ENT;  Laterality: N/A; . EYE SURGERY    X5 . TRACHEOSTOMY TUBE PLACEMENT N/A 04/18/2017  Procedure: TRANSNASAL FIBER OPTIC LARYNGOSCOPY;  Surgeon: [name],  MD;  Location: [place];  Service: ENT;  Laterality: N/A; HPI: KAHNE HELFAND is a 49 y.o. male with medical history significant of fourth ventricle medulla blastoma, status post surgery in 1995, left sided meningioma status post resection in 2004, seizure disorder, chronic ambulatory dysfunction on roller walker, hypothyroidism, presented with new onset of left-sided weakness, choking on food and overall mental status.  MRI shows Acute infarct of the right pons. Mother lives with pt and reported that while eating lunch, patient suddenly choked on his food, having strenuous coughing and became unresponsive, head leaning toward right side and unable to move left-sided arm or leg.  Mother also reported that for the last week, patient has had trouble eating his food with frequent cough after swallowing. in 2019 he was admitted to CIR after SDH with craniectomy and was discharged on a puree diet due to perseverative chewing had severe cognitive impairment. At baseline he is only able to speak a few words.  No data recorded Assessment / Plan / Recommendation CHL IP CLINICAL IMPRESSIONS 12/20/2019 Clinical Impression weakness, but also right labial weakness exacerbated by rightward lean. Pt has anterior spillage and slow, groping oral manipulation of bolus with pooling buccal cavities, oral holdig with verbal reminders needed to initiate transit, particularly with purees and premature spillage causing instances of silent aspiration with thin liquids. Pt is totally unable to masticate and only rolls soft soldis around his mouth. He will be at high risk of aspirating unmasticated solids if not on a pureed diet. Increased bolus size and decreased viscosity all increase penetrationa nd aspriation events. Pt is able to tolerate teaspoons and small cup sips of nectar thick liquids, though he will likely have some trace aspiration events of residue spilling to cords after sips. Recommend a puree diet and nectar thick liquids. Will  f/u for family education SLP Visit Diagnosis Dysphagia, oropharyngeal phase (R13.12) Attention and concentration deficit following -- Frontal lobe and executive function deficit following -- Impact on safety and function Severe aspiration risk;Moderate aspiration risk   CHL IP TREATMENT RECOMMENDATION 12/20/2019 Treatment Recommendations Therapy as outlined in treatment plan below   No flowsheet data found. CHL IP DIET RECOMMENDATION 12/20/2019 SLP Diet Recommendations Dysphagia 1 (Puree) solids;Thin liquid Liquid Administration via Cup;Spoon Medication Administration Crushed with puree Compensations Slow rate;Small sips/bites Postural Changes --   CHL IP OTHER RECOMMENDATIONS 04/19/2017 Recommended Consults -- Oral Care Recommendations Oral care BID Other Recommendations --   CHL IP FOLLOW UP RECOMMENDATIONS 12/20/2019 Follow up Recommendations 24 hour supervision/assistance   CHL IP FREQUENCY AND DURATION 12/20/2019 Speech Therapy Frequency (ACUTE ONLY) min 2x/week Treatment Duration --      CHL IP ORAL PHASE 12/20/2019 Oral Phase Impaired Oral - Pudding Teaspoon -- Oral - Pudding Cup -- Oral - Honey Teaspoon -- Oral - Honey Cup -- Oral - Nectar Teaspoon Right anterior bolus loss;Weak lingual manipulation;Reduced posterior propulsion;Right pocketing in lateral sulci;Lingual/palatal residue;Left pocketing in lateral sulci;Pocketing in anterior sulcus;Delayed oral transit;Decreased bolus cohesion;Premature spillage Oral - Nectar Cup Right anterior bolus loss;Weak lingual manipulation;Reduced posterior propulsion;Right pocketing in lateral sulci;Lingual/palatal residue;Left pocketing in lateral sulci;Pocketing in anterior sulcus;Delayed oral transit;Decreased bolus cohesion;Premature spillage Oral - Nectar Straw Right anterior bolus loss;Weak lingual manipulation;Reduced posterior propulsion;Right pocketing in lateral sulci;Lingual/palatal residue;Left pocketing in lateral sulci;Pocketing in anterior sulcus;Delayed oral  transit;Decreased bolus cohesion;Premature spillage Oral - Thin Teaspoon Right anterior bolus loss;Weak lingual manipulation;Reduced posterior propulsion;Right pocketing in lateral sulci;Lingual/palatal residue;Left pocketing in lateral sulci;Pocketing in anterior sulcus;Delayed oral transit;Decreased bolus cohesion;Premature spillage Oral - Thin Cup -- Oral -  Thin Straw Right anterior bolus loss;Weak lingual manipulation;Reduced posterior propulsion;Right pocketing in lateral sulci;Lingual/palatal residue;Left pocketing in lateral sulci;Pocketing in anterior sulcus;Delayed oral transit;Decreased bolus cohesion;Premature spillage;Holding of bolus Oral - Puree Right anterior bolus loss;Weak lingual manipulation;Reduced posterior propulsion;Right pocketing in lateral sulci;Lingual/palatal residue;Left pocketing in lateral sulci;Pocketing in anterior sulcus;Delayed oral transit;Decreased bolus cohesion;Premature spillage;Holding of bolus Oral - Mech Soft Right anterior bolus loss;Weak lingual manipulation;Reduced posterior propulsion;Right pocketing in lateral sulci;Lingual/palatal residue;Left pocketing in lateral sulci;Pocketing in anterior sulcus;Delayed oral transit;Decreased bolus cohesion;Holding of bolus Oral - Regular -- Oral - Multi-Consistency -- Oral - Pill -- Oral Phase - Comment --  CHL IP PHARYNGEAL PHASE 12/20/2019 Pharyngeal Phase Impaired Pharyngeal- Pudding Teaspoon -- Pharyngeal -- Pharyngeal- Pudding Cup -- Pharyngeal -- Pharyngeal- Honey Teaspoon -- Pharyngeal -- Pharyngeal- Honey Cup -- Pharyngeal -- Pharyngeal- Nectar Teaspoon Pharyngeal residue - valleculae;Pharyngeal residue - pyriform;Reduced tongue base retraction Pharyngeal -- Pharyngeal- Nectar Cup Reduced tongue base retraction;Penetration/Aspiration during swallow;Penetration/Apiration after swallow;Pharyngeal residue - valleculae;Pharyngeal residue - pyriform Pharyngeal Material enters airway, remains ABOVE vocal cords and not ejected out  Pharyngeal- Nectar Straw Reduced tongue base retraction;Pharyngeal residue - valleculae;Pharyngeal residue - pyriform Pharyngeal -- Pharyngeal- Thin Teaspoon -- Pharyngeal -- Pharyngeal- Thin Cup -- Pharyngeal -- Pharyngeal- Thin Straw -- Pharyngeal -- Pharyngeal- Puree Pharyngeal residue - valleculae;Pharyngeal residue - pyriform;Reduced tongue base retraction Pharyngeal -- Pharyngeal- Mechanical Soft Pharyngeal residue - valleculae;Pharyngeal residue - pyriform;Reduced tongue base retraction Pharyngeal -- Pharyngeal- Regular -- Pharyngeal -- Pharyngeal- Multi-consistency -- Pharyngeal -- Pharyngeal- Pill -- Pharyngeal -- Pharyngeal Comment --  No flowsheet data found. 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 EEG to evaluate for seizure Level of alertness: Awake AEDs during EEG study: LTG Technical aspects: This EEG study was done with scalp electrodes positioned according to the 10-20 International system of electrode placement. Electrical activity was acquired at a sampling rate of 500Hz  and reviewed with a high frequency filter of 70Hz  and a low frequency filter of 1Hz . EEG data were recorded continuously and digitally stored. Description: No posterior dominant rhythm was seen. EEG showed continuous generalized and maximal right posterior quadrant 3 to 6 Hz theta-delta slowing. Hyperventilation and photic stimulation were not  performed.   Parts of study were difficult to review due to significant myogenic artifact. ABNORMALITY -Continued slow, generalized and maximal right posterior quadrant IMPRESSION: This technically difficult study is suggestive of cortical dysfunction in right posterior quadrant suggestive of underlying structural abnormality, post-ictal state. There is also moderate diffuse encephalopathy, nonspecific etiology. No seizures or epileptiform discharges were seen throughout the recording. Lora Havens   ECHOCARDIOGRAM COMPLETE  Result Date: 12/21/2019    ECHOCARDIOGRAM REPORT   Patient Name:   CARDER YIN [facility] Date of Exam: 12/21/2019 Medical Rec #:  [id number]        Height:       74.0 in Accession #:    [id number]       Weight:       189.6 lb Date of Birth:  1970/08/23        BSA:          2.125 m Patient Age:    42 years         BP:           107/76 mmHg Patient Gender: M                HR:           66 bpm. Exam Location:  Inpatient Procedure: 2D Echo, Cardiac Doppler and Color Doppler Indications:    TIA 435.9 / G45.9  History:        Patient has no prior history of Echocardiogram examinations.                 Risk Factors:Dyslipidemia. Seizure Disorder. Brain cancer.                 Thyroid disease.  Sonographer:    Darlina Sicilian RDCS Referring Phys: [id number] Lequita Halt  Sonographer Comments: Global longitudinal strain was attempted. IMPRESSIONS  1. Left ventricular ejection fraction, by estimation, is 55 to 60%. The left ventricle has normal function. The left ventricle has no regional wall motion abnormalities. There is mild asymmetric left ventricular hypertrophy of the basal-septal segment. Left ventricular diastolic parameters were normal.  2. Right ventricular systolic function is normal. The right ventricular size is normal.  3. The mitral valve is normal in structure. No evidence of mitral valve regurgitation. No evidence of mitral stenosis.  4. The aortic valve is tricuspid. Aortic valve  regurgitation is not visualized. No aortic stenosis is present. Comparison(s): No prior Echocardiogram. Conclusion(s)/Recommendation(s): Normal biventricular function without evidence of hemodynamically significant valvular heart disease. No intracardiac source of embolism detected on this transthoracic study. A transesophageal echocardiogram is recommended to exclude cardiac source of embolism if clinically indicated. FINDINGS  Left Ventricle: Left ventricular ejection fraction, by estimation, is 55 to 60%. The left ventricle has normal function. The left ventricle has no regional wall motion abnormalities. Definity contrast agent was given IV to delineate the left ventricular  endocardial borders. Global longitudinal strain performed but not reported based on interpreter judgement due to suboptimal tracking. The left ventricular internal cavity size was normal in size. There is mild asymmetric left ventricular hypertrophy of the basal-septal segment. Left ventricular diastolic parameters were normal. Right Ventricle: The right ventricular size is normal. Right vetricular wall thickness was not well visualized. Right ventricular systolic function is normal. Left Atrium: Left atrial size was normal in size. Right Atrium: Right atrial size was not well visualized. Pericardium: There is no evidence of pericardial effusion. Mitral Valve: The mitral valve is normal in structure. No evidence of mitral valve regurgitation. No evidence of mitral valve stenosis. Tricuspid Valve: The tricuspid valve is normal in structure. Tricuspid valve regurgitation is not demonstrated. No evidence of tricuspid stenosis. Aortic Valve: The aortic valve is tricuspid. Aortic valve regurgitation is not visualized. No aortic stenosis is present. Pulmonic Valve: The pulmonic valve was not well visualized. Pulmonic valve regurgitation is not visualized. No evidence of pulmonic stenosis. Aorta: The aortic root is normal in size and structure and  the ascending aorta was not well visualized. Venous: The inferior vena cava was not well visualized. IAS/Shunts: The atrial septum is grossly normal.  LEFT VENTRICLE PLAX 2D LVIDd:         4.60 cm  Diastology LVIDs:         3.50 cm  LV e' medial:    4.73  cm/s LV PW:         0.90 cm  LV E/e' medial:  8.5 LV IVS:        1.30 cm  LV e' lateral:   9.60 cm/s LVOT diam:     2.30 cm  LV E/e' lateral: 4.2 LV SV:         38 LV SV Index:   18 LVOT Area:     4.15 cm 3D Volume EF                         LV 3D EDV:   85.78 ml                         LV 3D ESV:   37.70 ml                          3D Volume EF                         LV 3D EF:    8.90 %                          3D Volume EF:                         3D EF:        56 % LEFT ATRIUM             Index LA diam:        3.40 cm 1.60 cm/m LA Vol (A2C):   20.8 ml 9.79 ml/m LA Vol (A4C):   21.7 ml 10.21 ml/m LA Biplane Vol: 21.3 ml 10.03 ml/m  AORTIC VALVE LVOT Vmax:   51.05 cm/s LVOT Vmean:  35.350 cm/s LVOT VTI:    0.091 m  AORTA Ao Root diam: 3.90 cm MITRAL VALVE MV Area (PHT): 2.99 cm    SHUNTS MV Decel Time: 254 msec    Systemic VTI:  0.09 m MV E velocity: 40.10 cm/s  Systemic Diam: 2.30 cm MV A velocity: 38.60 cm/s MV E/A ratio:  1.04 Buford Dresser MD Electronically signed by Buford Dresser MD Signature Date/Time: 12/21/2019/2:20:58 PM    Final    CT HEAD CODE STROKE WO CONTRAST  Result Date: 12/19/2019 CLINICAL DATA:  Code stroke. Acute neuro deficit. Left arm weakness. Left facial droop. History of craniotomy for subdural hematoma. History of brain cancer. EXAM: CT HEAD WITHOUT CONTRAST TECHNIQUE: Contiguous axial images were obtained from the base of the skull through the vertex without intravenous contrast. COMPARISON:  CT head 08/23/2019 FINDINGS: Brain: Left frontal ventricular shunt passes through the third ventral into the suprasellar cistern. This is unchanged in position. No hydrocephalus. Ventricle size stable. Small low-density  chronic subdural fluid collection on the left is stable. No acute subdural hemorrhage. 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 Encephalomalacia right parietal lobe is unchanged. Extensive atrophy in the cerebellum bilaterally also unchanged. 16 mm dural based mass in the left frontal lobe unchanged from prior studies compatible with meningioma. No brain edema. Calcification in the basal ganglia bilaterally left greater than right stable. Negative for acute infarct.  Negative for acute hemorrhage. Vascular: Negative for hyperdense vessel Skull: Suboccipital craniectomy on the left. Large right hemispheric craniotomy. Sinuses/Orbits: Mild mucosal edema paranasal sinuses. Normal orbit bilaterally. Other: None  ASPECTS [facility] Stroke Program Early CT Score) - Ganglionic level infarction (caudate, lentiform nuclei, internal capsule, insula, M1-M3 cortex): 7 - Supraganglionic infarction (M4-M6 cortex): 3 Total score (0-10 with 10 being normal): 10 IMPRESSION: 1. No acute abnormality and no change from the prior CT. 2. ASPECTS is 10 3. Extensive postsurgical changes with right craniotomy and left suboccipital craniectomy. 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 4. 16 mm left frontal meningioma unchanged. 5. Code stroke imaging results were communicated on 12/19/2019 at 2:51 pm to provider Rory Percy via [place] Electronically Signed   By: Franchot Gallo M.D.   On: 12/19/2019 14:54     Subjective: No acute issues or events overnight   Discharge Exam: Vitals:   12/23/19 0308 12/23/19 1210  BP: 117/72 116/80  Pulse: 82 77  Resp: 18 17  Temp: 98.6 F (37 C) 97.8 F (36.6 C)  SpO2: 91% 94%   Vitals:   12/22/19 1157 12/22/19 2340 12/23/19 0308 12/23/19 1210  BP:  110/82 117/72 116/80  Pulse:  (!) 110 82 77  Resp:  (!) 22 18 17   Temp:  98.1 F (36.7 C) 98.6 F (37 C) 97.8 F (36.6 C)  TempSrc:  Oral Oral Oral  SpO2: 94% 97% 91% 94%  Weight:      Height:        General:   Pleasantly resting in bed, No acute distress.  Somnolent, poorly interactive, does not follow commands HEENT:  Normocephalic atraumatic.  Sclerae nonicteric, noninjected.  Extraocular movements intact bilaterally. Neck:  Without mass or deformity.  Trachea is midline. Lungs:  Clear to auscultate bilaterally without rhonchi, wheeze, or rales. Heart:  Regular rate and rhythm.  Without murmurs, rubs, or gallops. Abdomen:  Soft, nontender, nondistended.  Without guarding or rebound. Extremities: Without cyanosis, clubbing, edema, or obvious deformity.   The results of significant diagnostics from this hospitalization (including imaging, microbiology, ancillary and laboratory) are listed below for reference.     Microbiology: Recent Results (from the past 240 hour(s))  Urine culture     Status: Abnormal   Collection Time: 12/19/19  4:31 PM   Specimen: Urine, Random  Result Value Ref Range Status   Specimen Description URINE, RANDOM  Final   Special Requests NONE  Final   Culture (A)  Final    <10,000 COLONIES/mL INSIGNIFICANT GROWTH Performed at [facility], [address], [place], [place] [address]    Report Status 12/20/2019 FINAL  Final  Resp Panel by RT-PCR (Flu A&B, Covid) Nasopharyngeal Swab     Status: None   Collection Time: 12/19/19  4:31 PM   Specimen: Nasopharyngeal Swab; Nasopharyngeal(NP) swabs in vial transport medium  Result Value Ref Range Status   SARS Coronavirus 2 by RT PCR NEGATIVE NEGATIVE Final    Comment: (NOTE) SARS-CoV-2 target nucleic acids are NOT DETECTED.  The SARS-CoV-2 RNA is generally detectable in upper respiratory specimens during the acute phase of infection. The lowest concentration of SARS-CoV-2 viral copies this assay can detect is 138 copies/mL. A negative result does not preclude SARS-Cov-2 infection and should not be used as the sole basis for treatment or other patient management decisions. A negative result may occur with  improper  specimen collection/handling, submission of specimen other than nasopharyngeal swab, presence of viral mutation(s) within the areas targeted by this assay, and inadequate number of viral copies(<138 copies/mL). A negative result must be combined with clinical observations, patient history, and epidemiological information. The expected result is Negative.  Fact Sheet  for Patients:  EntrepreneurPulse.com.au  Fact Sheet for Healthcare Providers:  IncredibleEmployment.be  This test is no t yet approved or cleared by the [place] FDA and  has been authorized for detection and/or diagnosis of SARS-CoV-2 by FDA under an Emergency Use Authorization (EUA). This EUA will remain  in effect (meaning this test can be used) for the duration of the COVID-19 declaration under Section 564(b)(1) of the Act, 21 U.S.C.section 360bbb-3(b)(1), unless the authorization is terminated  or revoked sooner.       Influenza A by PCR NEGATIVE NEGATIVE Final   Influenza B by PCR NEGATIVE NEGATIVE Final    Comment: (NOTE) The Xpert Xpress SARS-CoV-2/FLU/RSV plus assay is intended as an aid in the diagnosis of influenza from Nasopharyngeal swab specimens and should not be used as a sole basis for treatment. Nasal washings and aspirates are unacceptable for Xpert Xpress SARS-CoV-2/FLU/RSV testing.  Fact Sheet for Patients: EntrepreneurPulse.com.au  Fact Sheet for Healthcare Providers: IncredibleEmployment.be  This test is not yet approved or cleared by the [place] FDA and has been authorized for detection and/or diagnosis of SARS-CoV-2 by FDA under an Emergency Use Authorization (EUA). This EUA will remain in effect (meaning this test can be used) for the duration of the COVID-19 declaration under Section 564(b)(1) of the Act, 21 U.S.C. section 360bbb-3(b)(1), unless the authorization is terminated or revoked.  Performed at  [facility], [place] [address], [place], [place] [address]      Labs: BNP (last 3 results) No results for input(s): BNP in the last 8760 hours. Basic Metabolic Panel: Recent Labs  Lab 12/19/19 1432 12/19/19 1445 12/20/19 0250 12/21/19 0156 12/22/19 0121  NA 140 141 135 138 136  K 3.3* 3.1* 4.3 3.7 3.7  CL 107 106 101 100 101  CO2 22  --  23 26 27   GLUCOSE 91 86 87 87 95  BUN 8 8 10 8 12   CREATININE 1.21 1.10 0.99 1.04 1.06  CALCIUM 8.2*  --  9.0 9.4 9.3  MG  --   --   --  1.7 1.9   Liver Function Tests: Recent Labs  Lab 12/19/19 1432  AST 27  ALT 37  ALKPHOS 64  BILITOT 0.6  PROT 5.4*  ALBUMIN 3.1*   No results for input(s): LIPASE, AMYLASE in the last 168 hours. No results for input(s): AMMONIA in the last 168 hours. CBC: Recent Labs  Lab 12/19/19 1432 12/19/19 1445 12/20/19 0250 12/21/19 0156 12/22/19 0121  WBC 8.5  --  15.3* 10.4 8.3  NEUTROABS 5.6  --  12.5* 7.5 5.4  HGB 13.3 13.3 14.2 13.1 14.3  HCT 40.9 39.0 43.8 37.8* 41.3  MCV 89.3  --  89.6 85.7 85.5  PLT 198  --  203 213 242   Cardiac Enzymes: No results for input(s): CKTOTAL, CKMB, CKMBINDEX, TROPONINI in the last 168 hours. BNP: Invalid input(s): POCBNP CBG: Recent Labs  Lab 12/19/19 1432  GLUCAP 96   D-Dimer No results for input(s): DDIMER in the last 72 hours. Hgb A1c No results for input(s): HGBA1C in the last 72 hours. Lipid Profile No results for input(s): CHOL, HDL, LDLCALC, TRIG, CHOLHDL, LDLDIRECT in the last 72 hours. Thyroid function studies No results for input(s): TSH, T4TOTAL, T3FREE, THYROIDAB in the last 72 hours.  Invalid input(s): FREET3 Anemia work up No results for input(s): VITAMINB12, FOLATE, FERRITIN, TIBC, IRON, RETICCTPCT in the last 72 hours. Urinalysis    Component Value Date/Time   COLORURINE YELLOW 12/19/2019 1631  APPEARANCEUR HAZY (A) 12/19/2019 1631   LABSPEC 1.019 12/19/2019 1631   PHURINE 5.0 12/19/2019 1631   GLUCOSEU NEGATIVE  12/19/2019 1631   HGBUR NEGATIVE 12/19/2019 1631   BILIRUBINUR NEGATIVE 12/19/2019 1631   BILIRUBINUR negative 08/23/2019 1050   KETONESUR NEGATIVE 12/19/2019 1631   PROTEINUR NEGATIVE 12/19/2019 1631   UROBILINOGEN 1.0 08/23/2019 1050   UROBILINOGEN 0.2 01/06/2014 1404   NITRITE NEGATIVE 12/19/2019 1631   LEUKOCYTESUR NEGATIVE 12/19/2019 1631   Sepsis Labs Invalid input(s): PROCALCITONIN,  WBC,  LACTICIDVEN Microbiology Recent Results (from the past 240 hour(s))  Urine culture     Status: Abnormal   Collection Time: 12/19/19  4:31 PM   Specimen: Urine, Random  Result Value Ref Range Status   Specimen Description URINE, RANDOM  Final   Special Requests NONE  Final   Culture (A)  Final    <10,000 COLONIES/mL INSIGNIFICANT GROWTH Performed at [facility], [place] [address] [place], [place] [address]    Report Status 12/20/2019 FINAL  Final  Resp Panel by RT-PCR (Flu A&B, Covid) Nasopharyngeal Swab     Status: None   Collection Time: 12/19/19  4:31 PM   Specimen: Nasopharyngeal Swab; Nasopharyngeal(NP) swabs in vial transport medium  Result Value Ref Range Status   SARS Coronavirus 2 by RT PCR NEGATIVE NEGATIVE Final    Comment: (NOTE) SARS-CoV-2 target nucleic acids are NOT DETECTED.  The SARS-CoV-2 RNA is generally detectable in upper respiratory specimens during the acute phase of infection. The lowest concentration of SARS-CoV-2 viral copies this assay can detect is 138 copies/mL. A negative result does not preclude SARS-Cov-2 infection and should not be used as the sole basis for treatment or other patient management decisions. A negative result may occur with  improper specimen collection/handling, submission of specimen other than nasopharyngeal swab, presence of viral mutation(s) within the areas targeted by this assay, and inadequate number of viral copies(<138 copies/mL). A negative result must be combined with clinical observations, patient history, and  epidemiological information. The expected result is Negative.  Fact Sheet for Patients:  EntrepreneurPulse.com.au  Fact Sheet for Healthcare Providers:  IncredibleEmployment.be  This test is no t yet approved or cleared by the [place] FDA and  has been authorized for detection and/or diagnosis of SARS-CoV-2 by FDA under an Emergency Use Authorization (EUA). This EUA will remain  in effect (meaning this test can be used) for the duration of the COVID-19 declaration under Section 564(b)(1) of the Act, 21 U.S.C.section 360bbb-3(b)(1), unless the authorization is terminated  or revoked sooner.       Influenza A by PCR NEGATIVE NEGATIVE Final   Influenza B by PCR NEGATIVE NEGATIVE Final    Comment: (NOTE) The Xpert Xpress SARS-CoV-2/FLU/RSV plus assay is intended as an aid in the diagnosis of influenza from Nasopharyngeal swab specimens and should not be used as a sole basis for treatment. Nasal washings and aspirates are unacceptable for Xpert Xpress SARS-CoV-2/FLU/RSV testing.  Fact Sheet for Patients: EntrepreneurPulse.com.au  Fact Sheet for Healthcare Providers: IncredibleEmployment.be  This test is not yet approved or cleared by the [place] FDA and has been authorized for detection and/or diagnosis of SARS-CoV-2 by FDA under an Emergency Use Authorization (EUA). This EUA will remain in effect (meaning this test can be used) for the duration of the COVID-19 declaration under Section 564(b)(1) of the Act, 21 U.S.C. section 360bbb-3(b)(1), unless the authorization is terminated or revoked.  Performed at [facility], [place] [address], [place], [place] [address]  Time coordinating discharge: Over 30 minutes  SIGNED:   Little Ishikawa, DO [facility] 12/23/2019, 1:19 PM Pager   If 7PM-7AM, please contact night-coverage [email or web address]

## 2019-12-23 NOTE — Care Management Important Message (Signed)
Important Message  Patient Details  Name: Glen [name] MRN: [id number] Date of Birth: [birth year]   Medicare Important Message Given:  Yes  Due to staffing Patient room called to advise of the IM,  did not get an answer IM document mailed to the patient home address.    Iris Bratton 12/23/2019, 3:12 PM

## 2019-12-23 NOTE — Progress Notes (Signed)
Report called to receiving facility RN.

## 2019-12-24 ENCOUNTER — Telehealth: Payer: Self-pay | Admitting: *Deleted

## 2019-12-24 NOTE — Telephone Encounter (Signed)
Suzzanne Cloud, NP  Brandon Melnick, RN Please schedule with Dr. Krista Blue if he seeks follow-up, looks like was considering hospice

## 2019-12-28 ENCOUNTER — Other Ambulatory Visit: Payer: Self-pay | Admitting: Physician Assistant

## 2020-01-08 ENCOUNTER — Ambulatory Visit: Payer: Medicare HMO | Admitting: Family Medicine

## 2020-01-15 ENCOUNTER — Ambulatory Visit: Payer: Self-pay | Admitting: Neurology

## 2020-01-18 DEATH — deceased

## 2020-03-02 ENCOUNTER — Ambulatory Visit: Payer: Medicare HMO | Admitting: Neurology

## 2021-02-02 NOTE — Telephone Encounter (Signed)
ERROR
